# Patient Record
Sex: Female | Born: 1970 | Race: White | Hispanic: No | Smoking: Never smoker
Health system: Southern US, Community
[De-identification: ages and names within clinical notes are randomized; demographics above are authoritative.]

## PROBLEM LIST (undated history)

## (undated) DIAGNOSIS — N760 Acute vaginitis: Secondary | ICD-10-CM

## (undated) DIAGNOSIS — E669 Obesity, unspecified: Secondary | ICD-10-CM

## (undated) DIAGNOSIS — J45909 Unspecified asthma, uncomplicated: Secondary | ICD-10-CM

## (undated) DIAGNOSIS — R569 Unspecified convulsions: Secondary | ICD-10-CM

## (undated) DIAGNOSIS — J189 Pneumonia, unspecified organism: Secondary | ICD-10-CM

## (undated) DIAGNOSIS — Z309 Encounter for contraceptive management, unspecified: Secondary | ICD-10-CM

## (undated) DIAGNOSIS — B9689 Other specified bacterial agents as the cause of diseases classified elsewhere: Secondary | ICD-10-CM

## (undated) DIAGNOSIS — K219 Gastro-esophageal reflux disease without esophagitis: Secondary | ICD-10-CM

## (undated) DIAGNOSIS — M5416 Radiculopathy, lumbar region: Secondary | ICD-10-CM

## (undated) DIAGNOSIS — N898 Other specified noninflammatory disorders of vagina: Secondary | ICD-10-CM

## (undated) DIAGNOSIS — I10 Essential (primary) hypertension: Secondary | ICD-10-CM

## (undated) HISTORY — DX: Other specified bacterial agents as the cause of diseases classified elsewhere: B96.89

## (undated) HISTORY — PX: CHOLECYSTECTOMY: SHX55

## (undated) HISTORY — DX: Other specified noninflammatory disorders of vagina: N89.8

## (undated) HISTORY — DX: Encounter for contraceptive management, unspecified: Z30.9

## (undated) HISTORY — DX: Obesity, unspecified: E66.9

## (undated) HISTORY — DX: Acute vaginitis: N76.0

---
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
## 2001-05-24 ENCOUNTER — Other Ambulatory Visit: Admission: RE | Admit: 2001-05-24 | Discharge: 2001-05-24 | Payer: Self-pay | Admitting: Obstetrics and Gynecology

## 2002-06-11 ENCOUNTER — Emergency Department (HOSPITAL_COMMUNITY): Admission: EM | Admit: 2002-06-11 | Discharge: 2002-06-11 | Payer: Self-pay | Admitting: Emergency Medicine

## 2007-12-06 ENCOUNTER — Other Ambulatory Visit: Admission: RE | Admit: 2007-12-06 | Discharge: 2007-12-06 | Payer: Self-pay | Admitting: Obstetrics and Gynecology

## 2008-12-15 ENCOUNTER — Other Ambulatory Visit: Admission: RE | Admit: 2008-12-15 | Discharge: 2008-12-15 | Payer: Self-pay | Admitting: Obstetrics and Gynecology

## 2009-06-21 ENCOUNTER — Emergency Department (HOSPITAL_COMMUNITY): Admission: EM | Admit: 2009-06-21 | Discharge: 2009-06-21 | Payer: Self-pay | Admitting: Emergency Medicine

## 2011-01-25 ENCOUNTER — Other Ambulatory Visit (HOSPITAL_COMMUNITY)
Admission: RE | Admit: 2011-01-25 | Discharge: 2011-01-25 | Disposition: A | Discharge status: Home / Self Care | Payer: Medicaid Other | Source: Ambulatory Visit | Attending: Obstetrics and Gynecology | Admitting: Obstetrics and Gynecology

## 2011-01-25 ENCOUNTER — Other Ambulatory Visit: Payer: Self-pay | Admitting: Adult Health

## 2011-01-25 DIAGNOSIS — Z01419 Encounter for gynecological examination (general) (routine) without abnormal findings: Secondary | ICD-10-CM | POA: Insufficient documentation

## 2011-01-25 DIAGNOSIS — Z113 Encounter for screening for infections with a predominantly sexual mode of transmission: Secondary | ICD-10-CM | POA: Insufficient documentation

## 2011-05-07 ENCOUNTER — Encounter (HOSPITAL_COMMUNITY): Payer: Self-pay

## 2011-05-07 ENCOUNTER — Emergency Department (HOSPITAL_COMMUNITY)
Admission: EM | Admit: 2011-05-07 | Discharge: 2011-05-07 | Disposition: A | Discharge status: Home / Self Care | Payer: Medicaid Other | Attending: Emergency Medicine | Admitting: Emergency Medicine

## 2011-05-07 DIAGNOSIS — B09 Unspecified viral infection characterized by skin and mucous membrane lesions: Secondary | ICD-10-CM

## 2011-05-07 DIAGNOSIS — R21 Rash and other nonspecific skin eruption: Secondary | ICD-10-CM | POA: Insufficient documentation

## 2011-05-07 MED ORDER — PREDNISONE 10 MG PO TABS: ORAL_TABLET | ORAL | Status: DC

## 2011-05-07 MED ORDER — HYDROXYZINE PAMOATE 25 MG PO CAPS: ORAL_CAPSULE | ORAL | Status: DC

## 2011-05-07 MED ORDER — LORATADINE 10 MG PO TABS: ORAL_TABLET | ORAL | Status: DC

## 2011-05-07 NOTE — ED Notes (Signed)
Pt presents with rash to torso, back, and arms that started last Saturday. Pt states she has tried Benadryl but no relief.

## 2011-05-07 NOTE — ED Provider Notes (Signed)
History     CSN: [id number]  Arrival date & time 05/07/11  1423   First MD Initiated Contact with Patient 05/07/11 1519      Chief Complaint  Patient presents with  . Rash    (Consider location/radiation/quality/duration/timing/severity/associated sxs/prior treatment) Patient is a 41 y.o. female presenting with rash. The history is provided by the patient.  Rash  This is a new problem. The current episode started more than 1 week ago. The problem has been gradually worsening. The problem is associated with nothing. There has been no fever. The rash is present on the abdomen and back (right axilla). The patient is experiencing no pain. Associated symptoms include itching. Pertinent negatives include no pain. She has tried nothing for the symptoms.    History reviewed. No pertinent past medical history.  Past Surgical History  Procedure Date  . Cholecystectomy     No family history on file.  History  Substance Use Topics  . Smoking status: Never Smoker   . Smokeless tobacco: Not on file  . Alcohol Use: No    OB History    Grav Para Term Preterm Abortions TAB SAB Ect Mult Living                  Review of Systems  Constitutional: Negative for activity change.       All ROS Neg except as noted in HPI  HENT: Negative for nosebleeds and neck pain.   Eyes: Negative for photophobia and discharge.  Respiratory: Negative for cough, shortness of breath and wheezing.   Cardiovascular: Negative for chest pain and palpitations.  Gastrointestinal: Negative for abdominal pain and blood in stool.  Genitourinary: Negative for dysuria, frequency and hematuria.  Musculoskeletal: Negative for back pain and arthralgias.  Skin: Positive for itching and rash.  Neurological: Negative for dizziness, seizures and speech difficulty.  Psychiatric/Behavioral: Negative for hallucinations and confusion.    Allergies  Penicillins and Codeine  Home Medications  No current outpatient  prescriptions on file.  BP 155/96  Pulse 99  Temp(Src) 98.3 F (36.8 C) (Oral)  Resp 20  Ht 5' 6.5" (1.689 m)  Wt 220 lb (99.791 kg)  BMI 34.98 kg/m2  SpO2 98%  LMP 05/05/2011  Physical Exam  Nursing note and vitals reviewed. Constitutional: She is oriented to person, place, and time. She appears well-developed and well-nourished.  Non-toxic appearance.  HENT:  Head: Normocephalic.  Right Ear: Tympanic membrane and external ear normal.  Left Ear: Tympanic membrane and external ear normal.  Eyes: EOM and lids are normal. Pupils are equal, round, and reactive to light.  Neck: Normal range of motion. Neck supple. Carotid bruit is not present.  Cardiovascular: Normal rate, regular rhythm, normal heart sounds, intact distal pulses and normal pulses.   Pulmonary/Chest: Breath sounds normal. No respiratory distress.  Abdominal: Soft. Bowel sounds are normal. There is no tenderness. There is no guarding.  Musculoskeletal: Normal range of motion.  Lymphadenopathy:       Head (right side): No submandibular adenopathy present.       Head (left side): No submandibular adenopathy present.    She has no cervical adenopathy.  Neurological: She is alert and oriented to person, place, and time. She has normal strength. No cranial nerve deficit or sensory deficit.  Skin: Skin is warm and dry. Rash noted.  Psychiatric: She has a normal mood and affect. Her speech is normal.    ED Course  Procedures (including critical care time)  Labs Reviewed -  No data to display No results found.   Dx: Rash   MDM  I have reviewed nursing notes, vital signs, and all appropriate lab and imaging results for this patient.        Kathie Dike, [place] 05/07/11 1537

## 2011-05-07 NOTE — ED Provider Notes (Signed)
Medical screening examination/treatment/procedure(s) were performed by non-physician practitioner and as supervising physician I was immediately available for consultation/collaboration.   Bion Todorov L Uzma Hellmer, MD 05/07/11 1712 

## 2012-01-11 ENCOUNTER — Encounter (HOSPITAL_COMMUNITY): Payer: Self-pay | Admitting: *Deleted

## 2012-01-11 ENCOUNTER — Emergency Department (HOSPITAL_COMMUNITY): Payer: Medicaid Other

## 2012-01-11 ENCOUNTER — Emergency Department (HOSPITAL_COMMUNITY)
Admission: EM | Admit: 2012-01-11 | Discharge: 2012-01-11 | Disposition: A | Discharge status: Home / Self Care | Payer: Medicaid Other | Attending: Emergency Medicine | Admitting: Emergency Medicine

## 2012-01-11 DIAGNOSIS — S61409A Unspecified open wound of unspecified hand, initial encounter: Secondary | ICD-10-CM | POA: Insufficient documentation

## 2012-01-11 DIAGNOSIS — Z23 Encounter for immunization: Secondary | ICD-10-CM | POA: Insufficient documentation

## 2012-01-11 DIAGNOSIS — W268XXA Contact with other sharp object(s), not elsewhere classified, initial encounter: Secondary | ICD-10-CM | POA: Insufficient documentation

## 2012-01-11 DIAGNOSIS — Z79899 Other long term (current) drug therapy: Secondary | ICD-10-CM | POA: Insufficient documentation

## 2012-01-11 DIAGNOSIS — S61411A Laceration without foreign body of right hand, initial encounter: Secondary | ICD-10-CM

## 2012-01-11 MED ORDER — BACITRACIN ZINC 500 UNIT/GM EX OINT
TOPICAL_OINTMENT | CUTANEOUS | Status: AC
  Filled 2012-01-11: qty 0.9

## 2012-01-11 MED ORDER — TETANUS-DIPHTH-ACELL PERTUSSIS 5-2.5-18.5 LF-MCG/0.5 IM SUSP
0.5000 mL | Freq: Once | INTRAMUSCULAR | Status: AC
  Administered 2012-01-11: 0.5 mL via INTRAMUSCULAR
  Filled 2012-01-11: qty 0.5

## 2012-01-11 MED ORDER — LIDOCAINE HCL (PF) 2 % IJ SOLN
INTRAMUSCULAR | Status: AC
  Administered 2012-01-11: 10 mL
  Filled 2012-01-11: qty 10

## 2012-01-11 MED ORDER — LIDOCAINE HCL (PF) 2 % IJ SOLN
10.0000 mL | Freq: Once | INTRAMUSCULAR | Status: AC
  Administered 2012-01-11: 10 mL

## 2012-01-11 NOTE — ED Notes (Signed)
Pt presents with rt palm laceration secondary to a fall on metal rod in her yard. Bleeding controlled, 4x4 guaze applied to wound. Pt states has not had a tetanus shot in 11 years. No other injuries reported at this time.

## 2012-01-11 NOTE — ED Provider Notes (Signed)
History     CSN: [id number]  Arrival date & time 01/11/12  1407   First MD Initiated Contact with Patient 01/11/12 1716      Chief Complaint  Patient presents with  . Laceration    (Consider location/radiation/quality/duration/timing/severity/associated sxs/prior treatment) Patient is a 41 y.o. female presenting with skin laceration. The history is provided by the patient.  Laceration  The incident occurred 3 to 5 hours ago. The laceration is located on the right hand. The laceration is 3 cm in size. The laceration mechanism was a a metal edge. The pain is moderate. The pain has been constant since onset. She reports no foreign bodies present. Her tetanus status is out of date.    History reviewed. No pertinent past medical history.  Past Surgical History  Procedure Date  . Cholecystectomy     No family history on file.  History  Substance Use Topics  . Smoking status: Never Smoker   . Smokeless tobacco: Not on file  . Alcohol Use: No    OB History    Grav Para Term Preterm Abortions TAB SAB Ect Mult Living                  Review of Systems  Constitutional: Negative for activity change.       All ROS Neg except as noted in HPI  HENT: Negative for nosebleeds and neck pain.   Eyes: Negative for photophobia and discharge.  Respiratory: Negative for cough, shortness of breath and wheezing.   Cardiovascular: Negative for chest pain and palpitations.  Gastrointestinal: Negative for abdominal pain and blood in stool.  Genitourinary: Negative for dysuria, frequency and hematuria.  Musculoskeletal: Negative for back pain and arthralgias.  Skin: Negative.   Neurological: Negative for dizziness, seizures and speech difficulty.  Psychiatric/Behavioral: Negative for hallucinations and confusion.    Allergies  Penicillins and Codeine  Home Medications   Current Outpatient Rx  Name Route Sig Dispense Refill  . ASPIRIN-ACETAMINOPHEN-CAFFEINE 250-250-65 MG PO TABS Oral  Take 2 tablets by mouth daily as needed. For migraine pain    . NORGESTIM-ETH ESTRAD TRIPHASIC 0.18/0.215/0.25 MG-25 MCG PO TABS Oral Take 1 tablet by mouth daily.    Marland Kitchen OMEPRAZOLE 20 MG PO CPDR Oral Take 20 mg by mouth daily.      BP 152/92  Pulse 84  Temp 98.5 F (36.9 C) (Oral)  Resp 20  Ht 5\' 6"  (1.676 m)  Wt 200 lb (90.719 kg)  BMI 32.28 kg/m2  SpO2 100%  LMP 12/11/2011  Physical Exam  Nursing note and vitals reviewed. Constitutional: She is oriented to person, place, and time. She appears well-developed and well-nourished.  Non-toxic appearance.  HENT:  Head: Normocephalic.  Right Ear: Tympanic membrane and external ear normal.  Left Ear: Tympanic membrane and external ear normal.  Eyes: EOM and lids are normal. Pupils are equal, round, and reactive to light.  Neck: Normal range of motion. Neck supple. Carotid bruit is not present.  Cardiovascular: Normal rate, regular rhythm, normal heart sounds, intact distal pulses and normal pulses.   Pulmonary/Chest: Breath sounds normal. No respiratory distress.  Abdominal: Soft. Bowel sounds are normal. There is no tenderness. There is no guarding.  Musculoskeletal: Normal range of motion.       There is a 2.8 cm laceration to the palmar surface of the right hand. There is full range of motion of all fingers. Capillary refill of all fingers is less than 3 seconds. There is no bone or tendon  involvement of the laceration. No foreign body visible.  Lymphadenopathy:       Head (right side): No submandibular adenopathy present.       Head (left side): No submandibular adenopathy present.    She has no cervical adenopathy.  Neurological: She is alert and oriented to person, place, and time. She has normal strength. No cranial nerve deficit or sensory deficit.  Skin: Skin is warm and dry.  Psychiatric: She has a normal mood and affect. Her speech is normal.    ED Course  Procedures :LACERATION REPAIR - patient identified by arm band.  Permission for the procedure is given by the patient. Procedural time out taken before repair of laceration to the right hand. The palm of the right hand was painted with Betadine. The laceration area was then infiltrated with 2% plain lidocaine. After adequate anesthesia. The area was irrigated with saline and investigated. A small amount of trash was removed without problem. The area was then irrigated a second time. Patient was again painted with Betadine. Using sterile technique 5 interrupted sutures of 4-0 nylon were used to repair the laceration with good wound edge approximation. Wound measures 2.8 cm. Bleeding was controlled. Patient tolerated the procedure without problem or complication. Sterile dressing applied by nursing staff. Patient's tetanus status was updated.  Labs Reviewed - No data to display Dg Hand Complete Right  01/11/2012  *RADIOLOGY REPORT*  Clinical Data: Laceration  RIGHT HAND - COMPLETE 3+ VIEW  Comparison: None.  Findings: Three views of the right hand submitted.  No acute fracture or subluxation.  No radiopaque foreign body.  Suboptimal study due to flexed position of the fifth finger.  IMPRESSION: No acute fracture or subluxation.  No radiopaque foreign body.   Original Report Authenticated By: Natasha Mead, M.D.    Pulse oximetry 100% on room air. Within normal limits by my interpretation.  1. Laceration of right hand       MDM  I have reviewed nursing notes, vital signs, and all appropriate lab and imaging results for this patient. Patient sustained a laceration during a fall on a piece of pipe from her stairs. The wound was cleansed and repaired. Patient advised to keep the wound clean and dry. She is to have the sutures removed in 7 days.       Kathie Dike, [place] 01/11/12 1757

## 2012-01-11 NOTE — ED Provider Notes (Signed)
Medical screening examination/treatment/procedure(s) were performed by non-physician practitioner and as supervising physician I was immediately available for consultation/collaboration. Devoria Albe, MD, FACEP   Ward Givens, MD 01/11/12 1800

## 2012-01-11 NOTE — ED Notes (Signed)
C/o right hand pain s/p fall; states tripped and fell coming downstairs; lacerated right palm on metal rod during fall; approx 2" lac to right hand; bleeding controlled.

## 2012-01-26 ENCOUNTER — Other Ambulatory Visit: Payer: Self-pay | Admitting: Adult Health

## 2012-01-26 ENCOUNTER — Other Ambulatory Visit (HOSPITAL_COMMUNITY)
Admission: RE | Admit: 2012-01-26 | Discharge: 2012-01-26 | Disposition: A | Discharge status: Home / Self Care | Payer: Medicaid Other | Source: Ambulatory Visit | Attending: Obstetrics and Gynecology | Admitting: Obstetrics and Gynecology

## 2012-01-26 DIAGNOSIS — Z1151 Encounter for screening for human papillomavirus (HPV): Secondary | ICD-10-CM | POA: Insufficient documentation

## 2012-01-26 DIAGNOSIS — Z01419 Encounter for gynecological examination (general) (routine) without abnormal findings: Secondary | ICD-10-CM | POA: Insufficient documentation

## 2012-04-12 ENCOUNTER — Emergency Department (HOSPITAL_COMMUNITY)
Admission: EM | Admit: 2012-04-12 | Discharge: 2012-04-12 | Disposition: A | Discharge status: Home / Self Care | Payer: Medicaid Other | Attending: Emergency Medicine | Admitting: Emergency Medicine

## 2012-04-12 ENCOUNTER — Encounter (HOSPITAL_COMMUNITY): Payer: Self-pay

## 2012-04-12 DIAGNOSIS — R51 Headache: Secondary | ICD-10-CM | POA: Insufficient documentation

## 2012-04-12 DIAGNOSIS — Z791 Long term (current) use of non-steroidal anti-inflammatories (NSAID): Secondary | ICD-10-CM | POA: Insufficient documentation

## 2012-04-12 DIAGNOSIS — J3489 Other specified disorders of nose and nasal sinuses: Secondary | ICD-10-CM | POA: Insufficient documentation

## 2012-04-12 DIAGNOSIS — Z79899 Other long term (current) drug therapy: Secondary | ICD-10-CM | POA: Insufficient documentation

## 2012-04-12 DIAGNOSIS — L519 Erythema multiforme, unspecified: Secondary | ICD-10-CM | POA: Insufficient documentation

## 2012-04-12 DIAGNOSIS — J392 Other diseases of pharynx: Secondary | ICD-10-CM | POA: Insufficient documentation

## 2012-04-12 DIAGNOSIS — J039 Acute tonsillitis, unspecified: Secondary | ICD-10-CM

## 2012-04-12 DIAGNOSIS — R509 Fever, unspecified: Secondary | ICD-10-CM | POA: Insufficient documentation

## 2012-04-12 DIAGNOSIS — Z7982 Long term (current) use of aspirin: Secondary | ICD-10-CM | POA: Insufficient documentation

## 2012-04-12 LAB — RAPID STREP SCREEN (MED CTR MEBANE ONLY): Streptococcus, Group A Screen (Direct): NEGATIVE

## 2012-04-12 MED ORDER — HYDROCODONE-ACETAMINOPHEN 7.5-500 MG/15ML PO SOLN: 10.0000 mL | ORAL | Status: DC | PRN

## 2012-04-12 MED ORDER — NAPROXEN 500 MG PO TABS: 500.0000 mg | ORAL_TABLET | Freq: Two times a day (BID) | ORAL | Status: DC

## 2012-04-12 NOTE — ED Notes (Signed)
C/o sore throat for several days. nad noted. Speaks without difficulty. Handling secretions without difficulty.

## 2012-04-15 NOTE — ED Provider Notes (Signed)
History     CSN: [id number]  Arrival date & time 04/12/12  1740   First MD Initiated Contact with Patient 04/12/12 2054      Chief Complaint  Patient presents with  . Sore Throat    (Consider location/radiation/quality/duration/timing/severity/associated sxs/prior treatment) Patient is a 41 y.o. female presenting with pharyngitis. The history is provided by the patient.  Sore Throat This is a new problem. The current episode started in the past 7 days. The problem occurs constantly. The problem has been unchanged. Associated symptoms include congestion, a fever, headaches and a sore throat. Pertinent negatives include no abdominal pain, anorexia, arthralgias, chest pain, chills, coughing, nausea, neck pain, numbness, rash, swollen glands, visual change, vomiting or weakness. The symptoms are aggravated by swallowing. She has tried nothing for the symptoms. The treatment provided no relief.    History reviewed. No pertinent past medical history.  Past Surgical History  Procedure Date  . Cholecystectomy     No family history on file.  History  Substance Use Topics  . Smoking status: Never Smoker   . Smokeless tobacco: Not on file  . Alcohol Use: No    OB History    Grav Para Term Preterm Abortions TAB SAB Ect Mult Living                  Review of Systems  Constitutional: Positive for fever. Negative for chills, activity change and appetite change.  HENT: Positive for congestion and sore throat. Negative for ear pain, facial swelling, trouble swallowing, neck pain, neck stiffness and voice change.   Eyes: Negative for pain and visual disturbance.  Respiratory: Negative for cough, chest tightness and shortness of breath.   Cardiovascular: Negative for chest pain.  Gastrointestinal: Negative for nausea, vomiting, abdominal pain and anorexia.  Genitourinary: Negative for dysuria.  Musculoskeletal: Negative for arthralgias.  Skin: Negative for color change and rash.    Neurological: Positive for headaches. Negative for dizziness, facial asymmetry, speech difficulty, weakness and numbness.  Hematological: Negative for adenopathy.  All other systems reviewed and are negative.    Allergies  Penicillins and Codeine  Home Medications   Current Outpatient Rx  Name  Route  Sig  Dispense  Refill  . ASPIRIN-ACETAMINOPHEN-CAFFEINE 250-250-65 MG PO TABS   Oral   Take 2 tablets by mouth daily as needed. For migraine pain         . NORGESTIM-ETH ESTRAD TRIPHASIC 0.18/0.215/0.25 MG-25 MCG PO TABS   Oral   Take 1 tablet by mouth daily.         Marland Kitchen OMEPRAZOLE 20 MG PO CPDR   Oral   Take 20 mg by mouth daily.         Marland Kitchen HYDROCODONE-ACETAMINOPHEN 7.5-500 MG/15ML PO SOLN   Oral   Take 10 mLs by mouth every 4 (four) hours as needed for pain.   60 mL   0   . NAPROXEN 500 MG PO TABS   Oral   Take 1 tablet (500 mg total) by mouth 2 (two) times daily with a meal.   20 tablet   0     BP 141/78  Pulse 112  Temp 98.1 F (36.7 C) (Oral)  Resp 22  Ht 5\' 6"  (1.676 m)  Wt 220 lb (99.791 kg)  BMI 35.51 kg/m2  SpO2 99%  LMP 04/05/2012  Physical Exam  Nursing note and vitals reviewed. Constitutional: She is oriented to person, place, and time. She appears well-developed and well-nourished. No distress.  HENT:  Head: Normocephalic and atraumatic. No trismus in the jaw.  Right Ear: Tympanic membrane and ear canal normal.  Left Ear: Tympanic membrane and ear canal normal.  Mouth/Throat: Uvula is midline and mucous membranes are normal. No uvula swelling. Oropharyngeal exudate, posterior oropharyngeal edema and posterior oropharyngeal erythema present. No tonsillar abscesses.  Eyes: EOM are normal. Pupils are equal, round, and reactive to light.  Neck: Normal range of motion. Neck supple.  Cardiovascular: Normal rate, regular rhythm, normal heart sounds and intact distal pulses.   No murmur heard. Pulmonary/Chest: Effort normal and breath sounds  normal. No respiratory distress. She exhibits no tenderness.  Musculoskeletal: Normal range of motion.  Lymphadenopathy:    She has no cervical adenopathy.  Neurological: She is alert and oriented to person, place, and time. She exhibits normal muscle tone. Coordination normal.  Skin: Skin is warm and dry.    ED Course  Procedures (including critical care time)   Results for orders placed during the hospital encounter of 04/12/12  RAPID STREP SCREEN      Component Value Range   Streptococcus, Group A Screen (Direct) NEGATIVE  NEGATIVE     1. Tonsillitis       MDM    Erythema of the oropharynx with bilateral tonsillar exudates.  No PTA.  Pt otherwise well appearing, no meningeal signs.  Likely viral.  Will treat symptomatically.  Agrees to f/u with her PMD if needed.  Prescribed: lortab elixir 60 mL naprosyn        Beverly Ferner L. [place], [place] 04/15/12 1943

## 2012-04-23 NOTE — ED Provider Notes (Signed)
Medical screening examination/treatment/procedure(s) were performed by non-physician practitioner and as supervising physician I was immediately available for consultation/collaboration.   Merry Pond M Kennieth Plotts, DO 04/23/12 1509 

## 2012-06-25 ENCOUNTER — Emergency Department (HOSPITAL_COMMUNITY)
Admission: EM | Admit: 2012-06-25 | Discharge: 2012-06-25 | Disposition: A | Discharge status: Home / Self Care | Payer: Medicaid Other | Attending: Emergency Medicine | Admitting: Emergency Medicine

## 2012-06-25 ENCOUNTER — Encounter (HOSPITAL_COMMUNITY): Payer: Self-pay

## 2012-06-25 DIAGNOSIS — R0602 Shortness of breath: Secondary | ICD-10-CM | POA: Insufficient documentation

## 2012-06-25 DIAGNOSIS — R093 Abnormal sputum: Secondary | ICD-10-CM | POA: Insufficient documentation

## 2012-06-25 DIAGNOSIS — J3489 Other specified disorders of nose and nasal sinuses: Secondary | ICD-10-CM | POA: Insufficient documentation

## 2012-06-25 DIAGNOSIS — J029 Acute pharyngitis, unspecified: Secondary | ICD-10-CM | POA: Insufficient documentation

## 2012-06-25 DIAGNOSIS — Z79899 Other long term (current) drug therapy: Secondary | ICD-10-CM | POA: Insufficient documentation

## 2012-06-25 DIAGNOSIS — J069 Acute upper respiratory infection, unspecified: Secondary | ICD-10-CM | POA: Insufficient documentation

## 2012-06-25 DIAGNOSIS — R071 Chest pain on breathing: Secondary | ICD-10-CM | POA: Insufficient documentation

## 2012-06-25 DIAGNOSIS — I10 Essential (primary) hypertension: Secondary | ICD-10-CM | POA: Insufficient documentation

## 2012-06-25 DIAGNOSIS — R1110 Vomiting, unspecified: Secondary | ICD-10-CM | POA: Insufficient documentation

## 2012-06-25 DIAGNOSIS — R0789 Other chest pain: Secondary | ICD-10-CM

## 2012-06-25 DIAGNOSIS — H9209 Otalgia, unspecified ear: Secondary | ICD-10-CM | POA: Insufficient documentation

## 2012-06-25 HISTORY — DX: Essential (primary) hypertension: I10

## 2012-06-25 LAB — RAPID STREP SCREEN (MED CTR MEBANE ONLY): Streptococcus, Group A Screen (Direct): NEGATIVE

## 2012-06-25 MED ORDER — MUCINEX DM 30-600 MG PO TB12: 1.0000 | ORAL_TABLET | Freq: Two times a day (BID) | ORAL | Status: DC | PRN

## 2012-06-25 NOTE — Discharge Instructions (Signed)
Drink plenty of fluids. You can take mucinex DM for your cough. You can take either ibuprofen 600 mg 4 times a day OR aleve 2 tabs twice a day for your chest wall pain. Recheck if you aren't improving in another week.

## 2012-06-25 NOTE — ED Provider Notes (Signed)
History    This chart was scribed for Ward Givens, MD by Charolett Bumpers, ED Scribe. The patient was seen in room [facility]19/[facility]. Patient's care was started at 0738.   CSN: [id number]  Arrival date & time 06/25/12  0719   First MD Initiated Contact with Patient 06/25/12 [phone]      Chief Complaint  Patient presents with  . Cough   The history is provided by the patient. No language interpreter was used.   Marie Mills is a 42 y.o. female who presents to the Emergency Department complaining of 3 days of gradually worsening, moderate productive cough with yellow phlegm. She reports associated congestion, sore throat, ear pain and post-tussive emesis. She denies any fever, wheezing or diarrhea. She states that her chest is sore with coughing/breathing. She was recently started on HTN medication 3 weeks ago. She reports sick contacts at home, daughter patient in ED with same.   PCP: Dr. Olena Leatherwood in [place]  Past Medical History  Diagnosis Date  . Hypertension     Past Surgical History  Procedure Laterality Date  . Cholecystectomy      No family history on file.  History  Substance Use Topics  . Smoking status: Never Smoker   . Smokeless tobacco: Not on file  . Alcohol Use: No  She denies smoking unemployed    OB History   Grav Para Term Preterm Abortions TAB SAB Ect Mult Living                  Review of Systems  Constitutional: Negative for fever and chills.  HENT: Positive for ear pain, congestion, sore throat and rhinorrhea.   Respiratory: Positive for shortness of breath.   All other systems reviewed and are negative.    Allergies  Penicillins and Codeine  Home Medications   Current Outpatient Rx  Name  Route  Sig  Dispense  Refill  . Norgestimate-Ethinyl Estradiol Triphasic (ORTHO TRI-CYCLEN LO) 0.18/0.215/0.25 MG-25 MCG tab   Oral   Take 1 tablet by mouth daily.         Marland Kitchen omeprazole (PRILOSEC) 20 MG capsule   Oral   Take 20 mg by mouth daily.          New BP pill for 3 weeks  Triage Vitals: BP 139/85  Pulse 77  Temp(Src) 97.9 F (36.6 C) (Oral)  Resp 20  Ht 5\' 6"  (1.676 m)  Wt 200 lb (90.719 kg)  BMI 32.3 kg/m2  SpO2 100%  LMP 06/04/2012  Vital signs normal    Physical Exam  Nursing note and vitals reviewed. Constitutional: She is oriented to person, place, and time. She appears well-developed and well-nourished.  HENT:  Head: Normocephalic and atraumatic.  Right Ear: External ear normal.  Left Ear: External ear normal.  Nose: Nose normal.  Mouth/Throat: Oropharynx is clear and moist. No oropharyngeal exudate.  Eyes: Conjunctivae and EOM are normal. Pupils are equal, round, and reactive to light.  Neck: Normal range of motion. Neck supple. No tracheal deviation present.  Cardiovascular: Normal rate, regular rhythm and normal heart sounds.  Exam reveals no gallop and no friction rub.   No murmur heard. Pulmonary/Chest: Effort normal and breath sounds normal. No respiratory distress. She has no wheezes. She has no rales. She exhibits tenderness.    Mild upper chest tenderness. Area of chest pain noted  Abdominal: Soft. Bowel sounds are normal. She exhibits no distension. There is no tenderness. There is no rebound and no guarding.  Musculoskeletal: Normal range of motion. She exhibits no tenderness.  Neurological: She is alert and oriented to person, place, and time.  Skin: Skin is warm and dry.  Psychiatric: She has a normal mood and affect. Her behavior is normal.    ED Course  Procedures (including critical care time)  DIAGNOSTIC STUDIES: Oxygen Saturation is 100% on room air, normal by my interpretation.    COORDINATION OF CARE:  08:00-Discussed planned course of treatment with the patient including a rapid strep screen, who is agreeable at this time.    Results for orders placed during the hospital encounter of 06/25/12  RAPID STREP SCREEN      Result Value Range   Streptococcus, Group A Screen  (Direct) NEGATIVE  NEGATIVE      1. URI (upper respiratory infection)   2. Chest wall pain    New Prescriptions   DEXTROMETHORPHAN-GUAIFENESIN (MUCINEX DM) 30-600 MG TB12    Take 1 tablet by mouth 2 (two) times daily as needed (cough).   Plan discharge  Devoria Albe, MD, FACEP    MDM   I personally performed the services described in this documentation, which was scribed in my presence. The recorded information has been reviewed and considered.  Devoria Albe, MD, FACEP      Ward Givens, MD 06/25/12 [phone]

## 2012-06-25 NOTE — ED Notes (Signed)
Sinus congestion, cough and sore throat x 3 days.  States cough and chest congestion kept her awake last night. Has been using Mucinex w/out relief.

## 2012-06-25 NOTE — ED Notes (Signed)
Patient with no complaints at this time. Respirations even and unlabored. Skin warm/dry. Discharge instructions reviewed with patient at this time. Patient given opportunity to voice concerns/ask questions. Patient discharged at this time and left Emergency Department with steady gait.   

## 2012-06-25 NOTE — ED Notes (Signed)
Cough and congestion

## 2012-09-21 ENCOUNTER — Emergency Department (HOSPITAL_COMMUNITY): Payer: No Typology Code available for payment source

## 2012-09-21 ENCOUNTER — Encounter (HOSPITAL_COMMUNITY): Payer: Self-pay | Admitting: *Deleted

## 2012-09-21 ENCOUNTER — Emergency Department (HOSPITAL_COMMUNITY)
Admission: EM | Admit: 2012-09-21 | Discharge: 2012-09-21 | Disposition: A | Discharge status: Home / Self Care | Payer: No Typology Code available for payment source | Attending: Emergency Medicine | Admitting: Emergency Medicine

## 2012-09-21 DIAGNOSIS — R0602 Shortness of breath: Secondary | ICD-10-CM | POA: Insufficient documentation

## 2012-09-21 DIAGNOSIS — Z880 Allergy status to penicillin: Secondary | ICD-10-CM | POA: Insufficient documentation

## 2012-09-21 DIAGNOSIS — Y92410 Unspecified street and highway as the place of occurrence of the external cause: Secondary | ICD-10-CM | POA: Insufficient documentation

## 2012-09-21 DIAGNOSIS — Y9389 Activity, other specified: Secondary | ICD-10-CM | POA: Insufficient documentation

## 2012-09-21 DIAGNOSIS — I10 Essential (primary) hypertension: Secondary | ICD-10-CM | POA: Insufficient documentation

## 2012-09-21 DIAGNOSIS — S298XXA Other specified injuries of thorax, initial encounter: Secondary | ICD-10-CM | POA: Insufficient documentation

## 2012-09-21 DIAGNOSIS — S40019A Contusion of unspecified shoulder, initial encounter: Secondary | ICD-10-CM | POA: Insufficient documentation

## 2012-09-21 DIAGNOSIS — Z79899 Other long term (current) drug therapy: Secondary | ICD-10-CM | POA: Insufficient documentation

## 2012-09-21 DIAGNOSIS — T148XXA Other injury of unspecified body region, initial encounter: Secondary | ICD-10-CM

## 2012-09-21 MED ORDER — HYDROCODONE-ACETAMINOPHEN 5-325 MG PO TABS: 1.0000 | ORAL_TABLET | ORAL | Status: DC | PRN

## 2012-09-21 MED ORDER — IBUPROFEN 800 MG PO TABS
800.0000 mg | ORAL_TABLET | Freq: Once | ORAL | Status: AC
  Administered 2012-09-21: 800 mg via ORAL
  Filled 2012-09-21: qty 1

## 2012-09-21 MED ORDER — ONDANSETRON HCL 4 MG PO TABS: 8.0000 mg | ORAL_TABLET | Freq: Three times a day (TID) | ORAL | Status: DC | PRN

## 2012-09-21 MED ORDER — ONDANSETRON 8 MG PO TBDP
8.0000 mg | ORAL_TABLET | Freq: Once | ORAL | Status: AC
  Administered 2012-09-21: 8 mg via ORAL
  Filled 2012-09-21: qty 1

## 2012-09-21 MED ORDER — HYDROCODONE-ACETAMINOPHEN 5-325 MG PO TABS
1.0000 | ORAL_TABLET | Freq: Once | ORAL | Status: AC
  Administered 2012-09-21: 1 via ORAL
  Filled 2012-09-21: qty 1
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
## 2012-09-21 NOTE — ED Provider Notes (Signed)
Medical screening examination/treatment/procedure(s) were performed by non-physician practitioner and as supervising physician I was immediately available for consultation/collaboration. Devoria Albe, MD, Armando Gang   Ward Givens, MD 09/21/12 [phone]

## 2012-09-21 NOTE — ED Notes (Signed)
Pt called nurse into room for n/v. Pt states she was trying to take deep breaths, felt phlegm in her throat and began to dry heave. Pt given emesis bag and damp cloth. No active vomiting. nad noted.

## 2012-09-21 NOTE — ED Notes (Signed)
Pt states nausea is better.  
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
## 2013-01-29 ENCOUNTER — Other Ambulatory Visit: Payer: Self-pay | Admitting: Adult Health

## 2013-02-08 ENCOUNTER — Other Ambulatory Visit: Payer: Self-pay | Admitting: *Deleted

## 2013-02-08 MED ORDER — NORGESTIM-ETH ESTRAD TRIPHASIC 0.18/0.215/0.25 MG-25 MCG PO TABS: 1.0000 | ORAL_TABLET | Freq: Every day | ORAL | Status: DC

## 2013-03-21 ENCOUNTER — Ambulatory Visit (INDEPENDENT_AMBULATORY_CARE_PROVIDER_SITE_OTHER): Payer: Medicaid Other | Admitting: Adult Health

## 2013-03-21 ENCOUNTER — Encounter: Payer: Self-pay | Admitting: Adult Health

## 2013-03-21 VITALS — BP 118/80 | HR 78 | Ht 66.0 in | Wt 239.0 lb

## 2013-03-21 DIAGNOSIS — Z1212 Encounter for screening for malignant neoplasm of rectum: Secondary | ICD-10-CM

## 2013-03-21 DIAGNOSIS — Z01419 Encounter for gynecological examination (general) (routine) without abnormal findings: Secondary | ICD-10-CM

## 2013-03-21 DIAGNOSIS — Z0000 Encounter for general adult medical examination without abnormal findings: Secondary | ICD-10-CM

## 2013-03-21 DIAGNOSIS — Z309 Encounter for contraceptive management, unspecified: Secondary | ICD-10-CM

## 2013-03-21 DIAGNOSIS — I10 Essential (primary) hypertension: Secondary | ICD-10-CM

## 2013-03-21 HISTORY — DX: Encounter for contraceptive management, unspecified: Z30.9

## 2013-03-21 LAB — HEMOCCULT GUIAC POC 1CARD (OFFICE): Fecal Occult Blood, POC: NEGATIVE

## 2013-03-21 NOTE — Progress Notes (Signed)
Patient ID: Marie Mills, female   DOB: [birth year], 42 y.o.   MRN: [id number] History of Present Illness:  Marie Mills is a 42 year old Hispanic female in stable relationship in for a physical.She had a normal pap with negative HPV in 2013.Is trying to lose weight.  Current Medications, Allergies, Past Medical History, Past Surgical History, Family History and Social History were reviewed in [name] record.     Review of Systems: Patient denies any headaches, blurred vision, shortness of breath, chest pain, abdominal pain, problems with bowel movements, urination, or intercourse. No joint pain or mood swings.She is happy with her OCs.    Physical Exam:BP 118/80  Pulse 78  Ht 5\' 6"  (1.676 m)  Wt 239 lb (108.41 kg)  BMI 38.59 kg/m2  LMP 03/01/2013 General:  Well developed, well nourished, no acute distress Skin:  Warm and dry Neck:  Midline trachea, normal thyroid Lungs; Clear to auscultation bilaterally Breast:  No dominant palpable mass, retraction, or nipple discharge Cardiovascular: Regular rate and rhythm Abdomen:  Soft, non tender, no hepatosplenomegaly Pelvic:  External genitalia is normal in appearance.  The vagina is normal in appearance.The cervix is bulbous.  Uterus is felt to be normal size, shape, and contour.  No adnexal masses or tenderness noted. Rectal: Good sphincter tone, no polyps, or hemorrhoids felt.  Hemoccult negative. Extremities:  No swelling or varicosities noted Psych:  No mood changes, alert and cooperative, seems happy   Impression: Yearly gyn exam no pap Contraceptive management Hypertension   Plan: Physical in 1 year Mammogram now and yearly Continue meds call if needs refills Try to lose weight, review handout on weight loss

## 2013-03-21 NOTE — Patient Instructions (Signed)
Physical in 1 year Mammogram now and yearly  Follow up prn Calorie Counting Diet A calorie counting diet requires you to eat the number of calories that are right for you in a day. Calories are the measurement of how much energy you get from the food you eat. Eating the right amount of calories is important for staying at a healthy weight. If you eat too many calories, your body will store them as fat and you may gain weight. If you eat too few calories, you may lose weight. Counting the number of calories you eat during a day will help you know if you are eating the right amount. A Registered Dietitian can determine how many calories you need in a day. The amount of calories needed varies from person to person. If your goal is to lose weight, you will need to eat fewer calories. Losing weight can benefit you if you are overweight or have health problems such as heart disease, high blood pressure, or diabetes. If your goal is to gain weight, you will need to eat more calories. Gaining weight may be necessary if you have a certain health problem that causes your body to need more energy. TIPS Whether you are increasing or decreasing the number of calories you eat during a day, it may be hard to get used to changes in what you eat and drink. The following are tips to help you keep track of the number of calories you eat.  Measure foods at home with measuring cups. This helps you know the amount of food and number of calories you are eating.  Restaurants often serve food in amounts that are larger than 1 serving. While eating out, estimate how many servings of a food you are given. For example, a serving of cooked rice is  cup or about the size of half of a fist. Knowing serving sizes will help you be aware of how much food you are eating at restaurants.  Ask for smaller portion sizes or child-size portions at restaurants.  Plan to eat half of a meal at a restaurant. Take the rest home or share the  other half with a friend.  Read the Nutrition Facts panel on food labels for calorie content and serving size. You can find out how many servings are in a package, the size of a serving, and the number of calories each serving has.  For example, a package might contain 3 cookies. The Nutrition Facts panel on that package says that 1 serving is 1 cookie. Below that, it will say there are 3 servings in the container. The calories section of the Nutrition Facts label says there are 90 calories. This means there are 90 calories in 1 cookie (1 serving). If you eat 1 cookie you have eaten 90 calories. If you eat all 3 cookies, you have eaten 270 calories (3 servings x 90 calories = 270 calories). The list below tells you how big or small some common portion sizes are.  1 oz.........4 stacked dice.  3 oz........Marland KitchenDeck of cards.  1 tsp.......Marland KitchenTip of little finger.  1 tbs......Marland KitchenMarland KitchenThumb.  2 tbs.......Marland KitchenGolf ball.   cup......Marland KitchenHalf of a fist.  1 cup.......Marland KitchenA fist. KEEP A FOOD LOG Write down every food item you eat, the amount you eat, and the number of calories in each food you eat during the day. At the end of the day, you can add up the total number of calories you have eaten. It may help to keep a list  like the one below. Find out the calorie information by reading the Nutrition Facts panel on food labels. Breakfast  Bran cereal (1 cup, 110 calories).  Fat-free milk ( cup, 45 calories). Snack  Apple (1 medium, 80 calories). Lunch  Spinach (1 cup, 20 calories).  Tomato ( medium, 20 calories).  Chicken breast strips (3 oz, 165 calories).  Shredded cheddar cheese ( cup, 110 calories).  Light [place] dressing (2 tbs, 60 calories).  Whole-wheat bread (1 slice, 80 calories).  Tub margarine (1 tsp, 35 calories).  Vegetable soup (1 cup, 160 calories). Dinner  Pork chop (3 oz, 190 calories).  Brown rice (1 cup, 215 calories).  Steamed broccoli ( cup, 20 calories).  Strawberries  (1  cup, 65 calories).  Whipped cream (1 tbs, 50 calories). Daily Calorie Total: 1425 Document Released: 04/04/2005 Document Revised: 06/27/2011 Document Reviewed: 09/29/2006 [facility] Patient Information 2014 [place], [place].

## 2013-08-20 ENCOUNTER — Other Ambulatory Visit: Payer: Self-pay | Admitting: Adult Health

## 2013-08-27 ENCOUNTER — Telehealth: Payer: Self-pay | Admitting: Adult Health

## 2013-08-27 NOTE — Telephone Encounter (Signed)
Pt states seen recently by Derrek Monaco, NP c/o itching and burning x 1 day, no discharge. Appt made for tomorrow at 12 pm with Derrek Monaco, NP.

## 2013-08-29 ENCOUNTER — Ambulatory Visit (INDEPENDENT_AMBULATORY_CARE_PROVIDER_SITE_OTHER): Payer: Medicaid Other | Admitting: Adult Health

## 2013-08-29 ENCOUNTER — Encounter: Payer: Self-pay | Admitting: Adult Health

## 2013-08-29 VITALS — BP 120/72 | Ht 67.0 in | Wt 248.0 lb

## 2013-08-29 DIAGNOSIS — B9689 Other specified bacterial agents as the cause of diseases classified elsewhere: Secondary | ICD-10-CM

## 2013-08-29 DIAGNOSIS — N899 Noninflammatory disorder of vagina, unspecified: Secondary | ICD-10-CM

## 2013-08-29 DIAGNOSIS — A499 Bacterial infection, unspecified: Secondary | ICD-10-CM

## 2013-08-29 DIAGNOSIS — N898 Other specified noninflammatory disorders of vagina: Secondary | ICD-10-CM

## 2013-08-29 DIAGNOSIS — N760 Acute vaginitis: Secondary | ICD-10-CM

## 2013-08-29 HISTORY — DX: Other specified noninflammatory disorders of vagina: N89.8

## 2013-08-29 HISTORY — DX: Other specified bacterial agents as the cause of diseases classified elsewhere: B96.89

## 2013-08-29 LAB — POCT WET PREP (WET MOUNT): WBC, Wet Prep HPF POC: POSITIVE

## 2013-08-29 MED ORDER — METRONIDAZOLE 500 MG PO TABS: 500.0000 mg | ORAL_TABLET | Freq: Two times a day (BID) | ORAL | Status: DC

## 2013-08-29 NOTE — Progress Notes (Signed)
Subjective:     Patient ID: Marie Mills, female   DOB: 1971-04-14, 43 y.o.   MRN: [id number]  HPI Khelani is a 43 year old Hispanic female in complaining of vaginal irritation, no new partners, started after finished period,did use new brand of tampons.  Review of Systems See HPI Reviewed past medical,surgical, social and family history. Reviewed medications and allergies.     Objective:   Physical Exam BP 120/72  Ht 5\' 7"  (1.702 m)  Wt 248 lb (112.492 kg)  BMI 38.83 kg/m2  LMP 08/27/2013   Skin warm and dry.Pelvic: external genitalia is normal in appearance, vagina: scant white discharge with odor, cervix:smooth and bulbous, uterus: normal size, shape and contour, non tender, no masses felt, adnexa: no masses or tenderness noted. Wet prep: + for clue cells and +WBCs.  Assessment:    Vaginal irritation BV    Plan:     Rx flagyl 500 mg 1 bid x 7 days, no alcohol, review handout on BV   Follow up prn

## 2013-08-29 NOTE — Patient Instructions (Signed)
Bacterial Vaginosis Bacterial vaginosis is a vaginal infection that occurs when the normal balance of bacteria in the vagina is disrupted. It results from an overgrowth of certain bacteria. This is the most common vaginal infection in women of childbearing age. Treatment is important to prevent complications, especially in pregnant women, as it can cause a premature delivery. CAUSES  Bacterial vaginosis is caused by an increase in harmful bacteria that are normally present in smaller amounts in the vagina. Several different kinds of bacteria can cause bacterial vaginosis. However, the reason that the condition develops is not fully understood. RISK FACTORS Certain activities or behaviors can put you at an increased risk of developing bacterial vaginosis, including:  Having a new sex partner or multiple sex partners.  Douching.  Using an intrauterine device (IUD) for contraception. Women do not get bacterial vaginosis from toilet seats, bedding, swimming pools, or contact with objects around them. SIGNS AND SYMPTOMS  Some women with bacterial vaginosis have no signs or symptoms. Common symptoms include:  Grey vaginal discharge.  A fishlike odor with discharge, especially after sexual intercourse.  Itching or burning of the vagina and vulva.  Burning or pain with urination. DIAGNOSIS  Your health care provider will take a medical history and examine the vagina for signs of bacterial vaginosis. A sample of vaginal fluid may be taken. Your health care provider will look at this sample under a microscope to check for bacteria and abnormal cells. A vaginal pH test may also be done.  TREATMENT  Bacterial vaginosis may be treated with antibiotic medicines. These may be given in the form of a pill or a vaginal cream. A second round of antibiotics may be prescribed if the condition comes back after treatment.  HOME CARE INSTRUCTIONS   Only take over-the-counter or prescription medicines as  directed by your health care provider.  If antibiotic medicine was prescribed, take it as directed. Make sure you finish it even if you start to feel better.  Do not have sex until treatment is completed.  Tell all sexual partners that you have a vaginal infection. They should see their health care provider and be treated if they have problems, such as a mild rash or itching.  Practice safe sex by using condoms and only having one sex partner. SEEK MEDICAL CARE IF:   Your symptoms are not improving after 3 days of treatment.  You have increased discharge or pain.  You have a fever. MAKE SURE YOU:   Understand these instructions.  Will watch your condition.  Will get help right away if you are not doing well or get worse. FOR MORE INFORMATION  Centers for Disease Control and Prevention, Division of STD Prevention: [employer] American Sexual Health Association (ASHA): www.ashastd.org  Document Released: 04/04/2005 Document Revised: 01/23/2013 Document Reviewed: 11/14/2012 [facility] Patient Information 2014 [place]. Follow up prn

## 2014-01-28 ENCOUNTER — Ambulatory Visit: Payer: Medicaid Other | Admitting: Adult Health

## 2014-01-28 ENCOUNTER — Encounter: Payer: Self-pay | Admitting: *Deleted

## 2014-02-17 ENCOUNTER — Encounter: Payer: Self-pay | Admitting: Adult Health

## 2014-03-04 ENCOUNTER — Telehealth: Payer: Self-pay | Admitting: Adult Health

## 2014-03-04 MED ORDER — NORGESTIM-ETH ESTRAD TRIPHASIC 0.18/0.215/0.25 MG-35 MCG PO TABS: 1.0000 | ORAL_TABLET | Freq: Every day | ORAL | Status: DC

## 2014-03-04 NOTE — Telephone Encounter (Signed)
Refilled OCs 

## 2014-03-04 NOTE — Telephone Encounter (Signed)
Left message x 1. [name] 

## 2014-03-04 NOTE — Telephone Encounter (Signed)
Spoke with pt. She is requesting a refill on her birth control pill, Tri-Linyah. She has an appt scheduled 03/24/14. Thanks!! [place]

## 2014-03-24 ENCOUNTER — Other Ambulatory Visit: Payer: Medicaid Other | Admitting: Adult Health

## 2014-04-16 ENCOUNTER — Encounter: Payer: Self-pay | Admitting: Adult Health

## 2014-04-16 ENCOUNTER — Ambulatory Visit (INDEPENDENT_AMBULATORY_CARE_PROVIDER_SITE_OTHER): Payer: Medicaid Other | Admitting: Adult Health

## 2014-04-16 VITALS — BP 122/72 | HR 78 | Ht 65.25 in | Wt 216.0 lb

## 2014-04-16 DIAGNOSIS — Z1212 Encounter for screening for malignant neoplasm of rectum: Secondary | ICD-10-CM

## 2014-04-16 DIAGNOSIS — Z0000 Encounter for general adult medical examination without abnormal findings: Secondary | ICD-10-CM

## 2014-04-16 DIAGNOSIS — J0110 Acute frontal sinusitis, unspecified: Secondary | ICD-10-CM

## 2014-04-16 DIAGNOSIS — Z3041 Encounter for surveillance of contraceptive pills: Secondary | ICD-10-CM

## 2014-04-16 DIAGNOSIS — Z01419 Encounter for gynecological examination (general) (routine) without abnormal findings: Secondary | ICD-10-CM

## 2014-04-16 LAB — HEMOCCULT GUIAC POC 1CARD (OFFICE): Fecal Occult Blood, POC: NEGATIVE

## 2014-04-16 MED ORDER — NORGESTIM-ETH ESTRAD TRIPHASIC 0.18/0.215/0.25 MG-35 MCG PO TABS: 1.0000 | ORAL_TABLET | Freq: Every day | ORAL | Status: DC

## 2014-04-16 MED ORDER — AZITHROMYCIN 250 MG PO TABS: ORAL_TABLET | ORAL | Status: DC

## 2014-04-16 NOTE — Progress Notes (Addendum)
Patient ID: Marie Mills, female   DOB: 01/12/1971, 43 y.o.   MRN: [id number] History of Present Illness: Marie Mills is a 43 year old Hispanic female in for gyn exam and BC refill.She complains of pressure and pain in face, and ears with cough.Called PCP, he was out of office. She has lost 32 lbs since May by walking.  Current Medications, Allergies, Past Medical History, Past Surgical History, Family History and Social History were reviewed in [employer] record.     Review of Systems: Patient denies any headaches, blurred vision, shortness of breath, chest pain, abdominal pain, problems with bowel movements, urination, or intercourse. No joint pain or mood swings, see HPI for positives.    Physical Exam:BP 122/72 mmHg  Pulse 78  Ht 5' 5.25" (1.657 m)  Wt 216 lb (97.977 kg)  BMI 35.68 kg/m2  LMP 04/02/2014 General:  Well developed, well nourished, no acute distress Skin:  Warm and dry Neck:  Midline trachea, normal thyroid, has positive tenderness in frontal, maxillary and ethmoid sinuses, ears clear, throat red no swelling or pustules Lungs; Clear to auscultation bilaterally Breast:  No dominant palpable mass, retraction, or nipple discharge Cardiovascular: Regular rate and rhythm Abdomen:  Soft, non tender, no hepatosplenomegaly Pelvic:  External genitalia is normal in appearance.  The vagina is normal in appearance. The cervix is bulbous.  Uterus is felt to be normal size, shape, and contour.  No adnexal masses or tenderness noted. Rectal: Good sphincter tone, no polyps, or hemorrhoids felt.  Hemoccult negative. Extremities:  No swelling or varicosities noted Psych:  No mood changes,alert and cooperative,seems happy   Impression:  Well woman gyn exam no pap Contraceptive management Sinus infection    Plan: Refilled tri-linyah x 1 year Rx Z pack Try delsym and any cough drop.also try honey ,lemon and hot water Pap and physical in 1 year Get mammogram now  and yearly Increase fluids and rest

## 2014-04-16 NOTE — Patient Instructions (Signed)
Increase fluids Take Z pack Try lemon and honey and delsym any cough drop Increase rest Physical and pap in 1 year Get mammogram

## 2014-04-22 ENCOUNTER — Telehealth: Payer: Self-pay | Admitting: Adult Health

## 2014-04-22 MED ORDER — NITROFURANTOIN MONOHYD MACRO 100 MG PO CAPS: 100.0000 mg | ORAL_CAPSULE | Freq: Two times a day (BID) | ORAL | Status: DC

## 2014-04-22 NOTE — Telephone Encounter (Signed)
Spoke with pt. Pt was put on an antibiotic for sinus infection. She is now having low back pain, urinary frequency, burning with urination. Symptoms started last night. Pt states this has happened before when she was on antibiotics. Can you order med? Thanks!! [place]

## 2014-04-22 NOTE — Telephone Encounter (Signed)
Finished Zpack yesterday has UTI symptoms will call in macrobid, if not better make appt

## 2014-09-22 ENCOUNTER — Other Ambulatory Visit: Payer: Self-pay | Admitting: Adult Health

## 2015-04-06 ENCOUNTER — Other Ambulatory Visit: Payer: Self-pay | Admitting: Adult Health

## 2015-04-22 ENCOUNTER — Other Ambulatory Visit: Payer: Medicaid Other | Admitting: Adult Health

## 2015-04-29 ENCOUNTER — Other Ambulatory Visit: Payer: Medicaid Other | Admitting: Adult Health

## 2015-05-13 ENCOUNTER — Other Ambulatory Visit: Payer: Medicaid Other | Admitting: Adult Health

## 2015-05-26 ENCOUNTER — Ambulatory Visit (INDEPENDENT_AMBULATORY_CARE_PROVIDER_SITE_OTHER): Payer: Medicaid Other | Admitting: Adult Health

## 2015-05-26 ENCOUNTER — Other Ambulatory Visit (HOSPITAL_COMMUNITY)
Admission: RE | Admit: 2015-05-26 | Discharge: 2015-05-26 | Disposition: A | Discharge status: Home / Self Care | Payer: Medicaid Other | Source: Ambulatory Visit | Attending: Adult Health | Admitting: Adult Health

## 2015-05-26 ENCOUNTER — Encounter: Payer: Self-pay | Admitting: Adult Health

## 2015-05-26 VITALS — BP 120/70 | HR 74 | Ht 66.0 in | Wt 239.0 lb

## 2015-05-26 DIAGNOSIS — Z3041 Encounter for surveillance of contraceptive pills: Secondary | ICD-10-CM

## 2015-05-26 DIAGNOSIS — Z309 Encounter for contraceptive management, unspecified: Secondary | ICD-10-CM | POA: Diagnosis not present

## 2015-05-26 DIAGNOSIS — Z113 Encounter for screening for infections with a predominantly sexual mode of transmission: Secondary | ICD-10-CM | POA: Insufficient documentation

## 2015-05-26 DIAGNOSIS — Z1151 Encounter for screening for human papillomavirus (HPV): Secondary | ICD-10-CM | POA: Insufficient documentation

## 2015-05-26 DIAGNOSIS — Z01419 Encounter for gynecological examination (general) (routine) without abnormal findings: Secondary | ICD-10-CM | POA: Insufficient documentation

## 2015-05-26 LAB — HEMOCCULT GUIAC POC 1CARD (OFFICE): Fecal Occult Blood, POC: NEGATIVE

## 2015-05-26 MED ORDER — NORGESTIM-ETH ESTRAD TRIPHASIC 0.18/0.215/0.25 MG-35 MCG PO TABS: 1.0000 | ORAL_TABLET | Freq: Every day | ORAL | Status: DC

## 2015-05-26 NOTE — Patient Instructions (Signed)
Physical in 1 year, pap in 3 if normal Mammogram now yearly

## 2015-05-26 NOTE — Progress Notes (Signed)
Patient ID: Marie Mills, female   DOB: Feb 23, 1971, 45 y.o.   MRN: [id number] History of Present Illness: Marie Mills is a 45 year old White female in for a well woman gyn exam and pap and refills OCs.No complaints, is working 3rd shift at [employer], she declines the flu shot. PCP is Dr Zada Girt.   Current Medications, Allergies, Past Medical History, Past Surgical History, Family History and Social History were reviewed in [employer] record.     Review of Systems: Patient denies any headaches, hearing loss, fatigue, blurred vision, shortness of breath, chest pain, abdominal pain, problems with bowel movements, urination, or intercourse. No joint pain or mood swings.    Physical Exam:BP 120/70 mmHg  Pulse 74  Ht 5\' 6"  (1.676 m)  Wt 239 lb (108.41 kg)  BMI 38.59 kg/m2  LMP 04/26/2015 General:  Well developed, well nourished, no acute distress Skin:  Warm and dry, numerous tattoos Neck:  Midline trachea, normal thyroid, good ROM, no lymphadenopathy Lungs; Clear to auscultation bilaterally Breast:  No dominant palpable mass, retraction, or nipple discharge Cardiovascular: Regular rate and rhythm Abdomen:  Soft, non tender, no hepatosplenomegaly Pelvic:  External genitalia is normal in appearance, no lesions.  The vagina is normal in appearance. Urethra has no lesions or masses. The cervix is bulbous, Pap with HPV and GC/CHL. Uterus is felt to be normal size, shape, and contour.  No adnexal masses or tenderness noted.Bladder is non tender, no masses felt. Rectal: Good sphincter tone, no polyps, or hemorrhoids felt.  Hemoccult negative. Extremities/musculoskeletal:  No swelling or varicosities noted, no clubbing or cyanosis Psych:  No mood changes, alert and cooperative,seems happy   Impression: Well woman gyn exam and pap Contraceptive management    Plan: Refilled Tri-linyah x 1 year Check HIV,RPR and HSV 2 Get mammogram now and yearly

## 2015-05-27 LAB — RPR: RPR Ser Ql: NONREACTIVE

## 2015-05-27 LAB — HSV 2 ANTIBODY, IGG: HSV 2 Glycoprotein G Ab, IgG: <0.91 index (ref 0.00–0.90)

## 2015-05-27 LAB — HIV ANTIBODY (ROUTINE TESTING W REFLEX): HIV Screen 4th Generation wRfx: NONREACTIVE

## 2015-06-01 ENCOUNTER — Telehealth: Payer: Self-pay | Admitting: Adult Health

## 2015-06-01 LAB — CYTOLOGY - PAP

## 2015-06-01 NOTE — Telephone Encounter (Signed)
Left message pap negative with negative GC/CHL but +HPV, repeat pap in 1 year

## 2015-06-09 ENCOUNTER — Emergency Department (HOSPITAL_COMMUNITY): Payer: No Typology Code available for payment source

## 2015-06-09 ENCOUNTER — Emergency Department (HOSPITAL_COMMUNITY)
Admission: EM | Admit: 2015-06-09 | Discharge: 2015-06-09 | Disposition: A | Discharge status: Home / Self Care | Payer: No Typology Code available for payment source | Attending: Emergency Medicine | Admitting: Emergency Medicine

## 2015-06-09 ENCOUNTER — Encounter (HOSPITAL_COMMUNITY): Payer: Self-pay | Admitting: Emergency Medicine

## 2015-06-09 DIAGNOSIS — Z880 Allergy status to penicillin: Secondary | ICD-10-CM | POA: Insufficient documentation

## 2015-06-09 DIAGNOSIS — Z8742 Personal history of other diseases of the female genital tract: Secondary | ICD-10-CM | POA: Diagnosis not present

## 2015-06-09 DIAGNOSIS — Y9389 Activity, other specified: Secondary | ICD-10-CM | POA: Insufficient documentation

## 2015-06-09 DIAGNOSIS — Z79899 Other long term (current) drug therapy: Secondary | ICD-10-CM | POA: Insufficient documentation

## 2015-06-09 DIAGNOSIS — I10 Essential (primary) hypertension: Secondary | ICD-10-CM | POA: Insufficient documentation

## 2015-06-09 DIAGNOSIS — S3991XA Unspecified injury of abdomen, initial encounter: Secondary | ICD-10-CM | POA: Insufficient documentation

## 2015-06-09 DIAGNOSIS — S0990XA Unspecified injury of head, initial encounter: Secondary | ICD-10-CM | POA: Insufficient documentation

## 2015-06-09 DIAGNOSIS — S298XXA Other specified injuries of thorax, initial encounter: Secondary | ICD-10-CM

## 2015-06-09 DIAGNOSIS — Z793 Long term (current) use of hormonal contraceptives: Secondary | ICD-10-CM | POA: Insufficient documentation

## 2015-06-09 DIAGNOSIS — Y998 Other external cause status: Secondary | ICD-10-CM | POA: Insufficient documentation

## 2015-06-09 DIAGNOSIS — R911 Solitary pulmonary nodule: Secondary | ICD-10-CM | POA: Diagnosis not present

## 2015-06-09 DIAGNOSIS — Y92410 Unspecified street and highway as the place of occurrence of the external cause: Secondary | ICD-10-CM | POA: Diagnosis not present

## 2015-06-09 DIAGNOSIS — S299XXA Unspecified injury of thorax, initial encounter: Secondary | ICD-10-CM | POA: Insufficient documentation

## 2015-06-09 DIAGNOSIS — Z3202 Encounter for pregnancy test, result negative: Secondary | ICD-10-CM | POA: Diagnosis not present

## 2015-06-09 DIAGNOSIS — S8011XA Contusion of right lower leg, initial encounter: Secondary | ICD-10-CM | POA: Diagnosis not present

## 2015-06-09 DIAGNOSIS — E669 Obesity, unspecified: Secondary | ICD-10-CM | POA: Diagnosis not present

## 2015-06-09 LAB — CBC WITH DIFFERENTIAL/PLATELET
Basophils Absolute: 0 10*3/uL (ref 0.0–0.1)
Basophils Relative: 0 %
Eosinophils Absolute: 0.1 10*3/uL (ref 0.0–0.7)
Eosinophils Relative: 1 %
HCT: 36.3 % (ref 36.0–46.0)
Hemoglobin: 11.4 g/dL — ABNORMAL LOW (ref 12.0–15.0)
Lymphocytes Relative: 22 %
Lymphs Abs: 2.2 10*3/uL (ref 0.7–4.0)
MCH: 25.7 pg — ABNORMAL LOW (ref 26.0–34.0)
MCHC: 31.4 g/dL (ref 30.0–36.0)
MCV: 81.9 fL (ref 78.0–100.0)
Monocytes Absolute: 0.7 10*3/uL (ref 0.1–1.0)
Monocytes Relative: 7 %
Neutro Abs: 6.8 10*3/uL (ref 1.7–7.7)
Neutrophils Relative %: 70 %
Platelets: 227 10*3/uL (ref 150–400)
RBC: 4.43 MIL/uL (ref 3.87–5.11)
RDW: 14 % (ref 11.5–15.5)
WBC: 9.9 10*3/uL (ref 4.0–10.5)

## 2015-06-09 LAB — BASIC METABOLIC PANEL
Anion gap: 8 (ref 5–15)
BUN: 18 mg/dL (ref 6–20)
CO2: 20 mmol/L — ABNORMAL LOW (ref 22–32)
Calcium: 8 mg/dL — ABNORMAL LOW (ref 8.9–10.3)
Chloride: 111 mmol/L (ref 101–111)
Creatinine, Ser: 0.6 mg/dL (ref 0.44–1.00)
GFR calc Af Amer: >60 mL/min (ref >60)
GFR calc non Af Amer: >60 mL/min (ref >60)
Glucose, Bld: 91 mg/dL (ref 65–99)
Potassium: 3.7 mmol/L (ref 3.5–5.1)
Sodium: 139 mmol/L (ref 135–145)

## 2015-06-09 LAB — I-STAT BETA HCG BLOOD, ED (MC, WL, AP ONLY): I-stat hCG, quantitative: <5 m[IU]/mL (ref <5)

## 2015-06-09 LAB — TROPONIN I: Troponin I: <0.03 ng/mL (ref <0.031)

## 2015-06-09 MED ORDER — IOHEXOL 300 MG/ML  SOLN
100.0000 mL | Freq: Once | INTRAMUSCULAR | Status: AC | PRN
  Administered 2015-06-09: 100 mL via INTRAVENOUS

## 2015-06-09 MED ORDER — FENTANYL CITRATE (PF) 100 MCG/2ML IJ SOLN
50.0000 ug | Freq: Once | INTRAMUSCULAR | Status: AC
  Administered 2015-06-09: 50 ug via INTRAVENOUS
  Filled 2015-06-09: qty 2

## 2015-06-09 MED ORDER — SODIUM CHLORIDE 0.9 % IV BOLUS (SEPSIS)
1000.0000 mL | Freq: Once | INTRAVENOUS | Status: AC
  Administered 2015-06-09: 1000 mL via INTRAVENOUS

## 2015-06-09 MED ORDER — ONDANSETRON HCL 4 MG/2ML IJ SOLN
4.0000 mg | Freq: Once | INTRAMUSCULAR | Status: AC
  Administered 2015-06-09: 4 mg via INTRAVENOUS
  Filled 2015-06-09: qty 2

## 2015-06-09 MED ORDER — IBUPROFEN 600 MG PO TABS: 600.0000 mg | ORAL_TABLET | Freq: Four times a day (QID) | ORAL | Status: DC | PRN

## 2015-06-09 MED ORDER — ONDANSETRON 8 MG PO TBDP
8.0000 mg | ORAL_TABLET | Freq: Once | ORAL | Status: AC
  Administered 2015-06-09: 8 mg via ORAL
  Filled 2015-06-09: qty 1

## 2015-06-09 MED ORDER — KETOROLAC TROMETHAMINE 30 MG/ML IJ SOLN
30.0000 mg | Freq: Once | INTRAMUSCULAR | Status: AC
  Administered 2015-06-09: 30 mg via INTRAVENOUS
  Filled 2015-06-09: qty 1

## 2015-06-09 MED ORDER — HYDROMORPHONE HCL 1 MG/ML IJ SOLN
1.0000 mg | Freq: Once | INTRAMUSCULAR | Status: AC
  Administered 2015-06-09: 1 mg via INTRAVENOUS
  Filled 2015-06-09: qty 1

## 2015-06-09 MED ORDER — MORPHINE SULFATE (PF) 4 MG/ML IV SOLN
4.0000 mg | Freq: Once | INTRAVENOUS | Status: AC
  Administered 2015-06-09: 4 mg via INTRAVENOUS
  Filled 2015-06-09: qty 1

## 2015-06-09 NOTE — ED Notes (Signed)
Pt sitting up on side of bed.  C/o nausea.  States "I know something is wrong with my chest" .  After sitting up for a few minutes.  Pt states she feels like she is going to pass out.  layed pt back down.  VSS.

## 2015-06-09 NOTE — Discharge Instructions (Signed)
Blunt Chest Trauma °Blunt chest trauma is an injury caused by a blow to the chest. These chest injuries can be very painful. Blunt chest trauma often results in bruised or broken (fractured) ribs. Most cases of bruised and fractured ribs from blunt chest traumas get better after 1 to 3 weeks of rest and pain medicine. Often, the soft tissue in the chest wall is also injured, causing pain and bruising. Internal organs, such as the heart and lungs, may also be injured. Blunt chest trauma can lead to serious medical problems. This injury requires immediate medical care. °CAUSES  °· Motor vehicle collisions. °· Falls. °· Physical violence. °· Sports injuries. °SYMPTOMS  °· Chest pain. The pain may be worse when you move or breathe deeply. °· Shortness of breath. °· Lightheadedness. °· Bruising. °· Tenderness. °· Swelling. °DIAGNOSIS  °Your caregiver will do a physical exam. X-rays may be taken to look for fractures. However, minor rib fractures may not show up on X-rays until a few days after the injury. If a more serious injury is suspected, further imaging tests may be done. This may include ultrasounds, computed tomography (CT) scans, or magnetic resonance imaging (MRI). °TREATMENT  °Treatment depends on the severity of your injury. Your caregiver may prescribe pain medicines and deep breathing exercises. °HOME CARE INSTRUCTIONS °· Limit your activities until you can move around without much pain. °· Do not do any strenuous work until your injury is healed. °· Put ice on the injured area. °¨ Put ice in a plastic bag. °¨ Place a towel between your skin and the bag. °¨ Leave the ice on for 15-20 minutes, 03-04 times a day. °· You may wear a rib belt as directed by your caregiver to reduce pain. °· Practice deep breathing as directed by your caregiver to keep your lungs clear. °· Only take over-the-counter or prescription medicines for pain, fever, or discomfort as directed by your caregiver. °SEEK IMMEDIATE MEDICAL  CARE IF:  °· You have increasing pain or shortness of breath. °· You cough up blood. °· You have nausea, vomiting, or abdominal pain. °· You have a fever. °· You feel dizzy, weak, or you faint. °MAKE SURE YOU: °· Understand these instructions. °· Will watch your condition. °· Will get help right away if you are not doing well or get worse. °  °This information is not intended to replace advice given to you by your health care provider. Make sure you discuss any questions you have with your health care provider. °  °Document Released: 05/12/2004 Document Revised: 04/25/2014 Document Reviewed: 10/01/2014 °Elsevier Interactive Patient Education ©2016 [employer] ° °
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
## 2015-06-09 NOTE — ED Notes (Signed)
Dr. Christy Gentles notified of pt feeling like she was going to pass out and of pt ongoing chest pain.

## 2015-06-09 NOTE — ED Notes (Signed)
C/o "crushing chest pain,  Feels like a heart attack.  States I have this pain after codeine. "  Repeat EKG done and given to Dr. Christy Gentles.
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
## 2015-06-09 NOTE — ED Notes (Signed)
Pt continues to have ongoing left sided chest pain.  Dr. Christy Gentles in to reassess pt.  Orders received.

## 2015-06-09 NOTE — ED Notes (Signed)
Pt waiting to be eval by EDP

## 2015-06-11 ENCOUNTER — Emergency Department (HOSPITAL_COMMUNITY)
Admission: EM | Admit: 2015-06-11 | Discharge: 2015-06-11 | Disposition: A | Discharge status: Home / Self Care | Payer: No Typology Code available for payment source | Attending: Emergency Medicine | Admitting: Emergency Medicine

## 2015-06-11 ENCOUNTER — Encounter (HOSPITAL_COMMUNITY): Payer: Self-pay | Admitting: Emergency Medicine

## 2015-06-11 ENCOUNTER — Emergency Department (HOSPITAL_COMMUNITY): Payer: No Typology Code available for payment source

## 2015-06-11 DIAGNOSIS — S4992XA Unspecified injury of left shoulder and upper arm, initial encounter: Secondary | ICD-10-CM | POA: Insufficient documentation

## 2015-06-11 DIAGNOSIS — Y998 Other external cause status: Secondary | ICD-10-CM | POA: Insufficient documentation

## 2015-06-11 DIAGNOSIS — S301XXA Contusion of abdominal wall, initial encounter: Secondary | ICD-10-CM | POA: Diagnosis not present

## 2015-06-11 DIAGNOSIS — E669 Obesity, unspecified: Secondary | ICD-10-CM | POA: Insufficient documentation

## 2015-06-11 DIAGNOSIS — Z880 Allergy status to penicillin: Secondary | ICD-10-CM | POA: Insufficient documentation

## 2015-06-11 DIAGNOSIS — I10 Essential (primary) hypertension: Secondary | ICD-10-CM | POA: Diagnosis not present

## 2015-06-11 DIAGNOSIS — Y92410 Unspecified street and highway as the place of occurrence of the external cause: Secondary | ICD-10-CM | POA: Insufficient documentation

## 2015-06-11 DIAGNOSIS — Z793 Long term (current) use of hormonal contraceptives: Secondary | ICD-10-CM | POA: Insufficient documentation

## 2015-06-11 DIAGNOSIS — S3992XA Unspecified injury of lower back, initial encounter: Secondary | ICD-10-CM | POA: Insufficient documentation

## 2015-06-11 DIAGNOSIS — Z8742 Personal history of other diseases of the female genital tract: Secondary | ICD-10-CM | POA: Insufficient documentation

## 2015-06-11 DIAGNOSIS — S2232XA Fracture of one rib, left side, initial encounter for closed fracture: Secondary | ICD-10-CM

## 2015-06-11 DIAGNOSIS — Y9389 Activity, other specified: Secondary | ICD-10-CM | POA: Insufficient documentation

## 2015-06-11 DIAGNOSIS — Z79899 Other long term (current) drug therapy: Secondary | ICD-10-CM | POA: Insufficient documentation

## 2015-06-11 DIAGNOSIS — S2242XA Multiple fractures of ribs, left side, initial encounter for closed fracture: Secondary | ICD-10-CM | POA: Diagnosis not present

## 2015-06-11 DIAGNOSIS — S299XXA Unspecified injury of thorax, initial encounter: Secondary | ICD-10-CM | POA: Diagnosis present

## 2015-06-11 MED ORDER — HYDROMORPHONE HCL 1 MG/ML IJ SOLN
1.0000 mg | Freq: Once | INTRAMUSCULAR | Status: AC
  Administered 2015-06-11: 1 mg via INTRAMUSCULAR
  Filled 2015-06-11: qty 1

## 2015-06-11 MED ORDER — KETOROLAC TROMETHAMINE 60 MG/2ML IM SOLN
60.0000 mg | Freq: Once | INTRAMUSCULAR | Status: AC
  Administered 2015-06-11: 60 mg via INTRAMUSCULAR
  Filled 2015-06-11: qty 2

## 2015-06-11 MED ORDER — HYDROCODONE-ACETAMINOPHEN 5-325 MG PO TABS: 1.0000 | ORAL_TABLET | ORAL | Status: DC | PRN

## 2015-06-11 MED ORDER — ONDANSETRON HCL 4 MG PO TABS
4.0000 mg | ORAL_TABLET | Freq: Once | ORAL | Status: AC
  Administered 2015-06-11: 4 mg via ORAL
  Filled 2015-06-11: qty 1

## 2015-06-11 MED ORDER — DIAZEPAM 5 MG PO TABS
5.0000 mg | ORAL_TABLET | Freq: Once | ORAL | Status: AC
  Administered 2015-06-11: 5 mg via ORAL
  Filled 2015-06-11: qty 1

## 2015-06-11 MED ORDER — CYCLOBENZAPRINE HCL 10 MG PO TABS: 10.0000 mg | ORAL_TABLET | Freq: Two times a day (BID) | ORAL | Status: DC | PRN
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
## 2015-06-11 NOTE — ED Notes (Signed)
PA at bedside.
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
## 2016-02-20 ENCOUNTER — Emergency Department (HOSPITAL_COMMUNITY)
Admission: EM | Admit: 2016-02-20 | Discharge: 2016-02-20 | Disposition: A | Discharge status: Home / Self Care | Payer: Self-pay | Attending: Emergency Medicine | Admitting: Emergency Medicine

## 2016-02-20 ENCOUNTER — Encounter (HOSPITAL_COMMUNITY): Payer: Self-pay | Admitting: Emergency Medicine

## 2016-02-20 ENCOUNTER — Emergency Department (HOSPITAL_COMMUNITY): Payer: Self-pay

## 2016-02-20 DIAGNOSIS — Z79899 Other long term (current) drug therapy: Secondary | ICD-10-CM | POA: Insufficient documentation

## 2016-02-20 DIAGNOSIS — I10 Essential (primary) hypertension: Secondary | ICD-10-CM | POA: Insufficient documentation

## 2016-02-20 DIAGNOSIS — M5432 Sciatica, left side: Secondary | ICD-10-CM | POA: Insufficient documentation

## 2016-02-20 LAB — POC URINE PREG, ED: Preg Test, Ur: NEGATIVE

## 2016-02-20 MED ORDER — PREDNISONE 10 MG PO TABS: ORAL_TABLET | ORAL | 0 refills | Status: DC

## 2016-02-20 MED ORDER — HYDROCODONE-ACETAMINOPHEN 5-325 MG PO TABS: 2.0000 | ORAL_TABLET | ORAL | 0 refills | Status: DC | PRN

## 2016-02-20 NOTE — ED Notes (Signed)
Pt reports L hip pain, has hx of bilateral hip pain, denies injury, bowel/bladder dysfunction.

## 2016-02-20 NOTE — ED Triage Notes (Signed)
Pt reports L hip pain with radiation down her L leg. Pt denies back pain or back injury. Onset of symptoms 4 days ago.

## 2016-02-21 NOTE — ED Provider Notes (Signed)
[facility] DEPT Provider Note   CSN: [id number] Arrival date & time: 02/20/16  1202     History   Chief Complaint Chief Complaint  Patient presents with  . Hip Pain    HPI Marie Mills is a 45 y.o. female.  The history is provided by the patient. No language interpreter was used.  Hip Pain  This is a new problem. The problem occurs constantly. The problem has been gradually worsening. Pertinent negatives include no chest pain, no abdominal pain, no headaches and no shortness of breath. Nothing aggravates the symptoms. Nothing relieves the symptoms. She has tried nothing for the symptoms. The treatment provided no relief.  Pt reports pain in her left hip  No injury Pain started 4 days ago  Past Medical History:  Diagnosis Date  . BV (bacterial vaginosis) 08/29/2013  . Contraceptive management 03/21/2013  . Hypertension   . Obesity   . Vaginal irritation 08/29/2013    Patient Active Problem List   Diagnosis Date Noted  . Vaginal irritation 08/29/2013  . BV (bacterial vaginosis) 08/29/2013  . Hypertension 03/21/2013  . Contraceptive management 03/21/2013    Past Surgical History:  Procedure Laterality Date  . CHOLECYSTECTOMY      OB History    Gravida Para Term Preterm AB Living   4 3     1 3    SAB TAB Ectopic Multiple Live Births     1     3       Home Medications    Prior to Admission medications   Medication Sig Start Date End Date Taking? Authorizing Provider  amLODipine (NORVASC) 5 MG tablet Take 5 mg by mouth daily.   Yes Historical Provider, MD  losartan-hydrochlorothiazide (HYZAAR) 100-25 MG per tablet Take 1 tablet by mouth daily.  01/31/14  Yes Historical Provider, MD  Norgestimate-Ethinyl Estradiol Triphasic (TRI-LINYAH) 0.18/0.215/0.25 MG-35 MCG tablet Take 1 tablet by mouth daily. 05/26/15  Yes Estill Dooms, NP  ranitidine (ZANTAC) 300 MG tablet Take 1 tablet by mouth 2 (two) times daily. 02/17/16  Yes Historical Provider, MD    HYDROcodone-acetaminophen (NORCO/VICODIN) 5-325 MG tablet Take 2 tablets by mouth every 4 (four) hours as needed. 02/20/16   Fransico Meadow, PA-C  predniSONE (DELTASONE) 10 MG tablet 6,5,4,3,2,1 taper 02/20/16   Fransico Meadow, PA-C    Family History Family History  Problem Relation Age of Onset  . Hypertension Mother   . Hyperlipidemia Mother   . Diabetes Mother   . Hyperlipidemia Father   . Hypertension Father   . Diabetes Father   . Seizures Father   . Cancer Maternal Aunt     breast  . Diabetes Sister   . Hypertension Sister   . Diabetes Sister   . Hypertension Sister   . Diabetes Brother   . Hypertension Brother   . Hypertension Sister   . Asthma Son   . Asthma Daughter     Social History Social History  Substance Use Topics  . Smoking status: Never Smoker  . Smokeless tobacco: Never Used  . Alcohol use No     Allergies   Penicillins; Codeine; and Sulfa antibiotics   Review of Systems Review of Systems  Respiratory: Negative for shortness of breath.   Cardiovascular: Negative for chest pain.  Gastrointestinal: Negative for abdominal pain.  Neurological: Negative for headaches.  All other systems reviewed and are negative.    Physical Exam Updated Vital Signs BP 140/75 (BP Location: Left Arm)   Pulse 71  Temp 98 F (36.7 C) (Oral)   Resp 18   Ht 5\' 6"  (1.676 m)   Wt 90.7 kg   LMP 02/06/2016   SpO2 100%   BMI 32.28 kg/m   Physical Exam  Constitutional: She is oriented to person, place, and time. She appears well-developed and well-nourished.  HENT:  Head: Normocephalic.  Eyes: EOM are normal.  Neck: Normal range of motion.  Pulmonary/Chest: Effort normal.  Abdominal: She exhibits no distension.  Musculoskeletal: She exhibits tenderness.  Tender left hip and left sciatic area from ns and nv intact  Neurological: She is alert and oriented to person, place, and time.  Skin: Skin is warm.  Psychiatric: She has a normal mood and affect.   Nursing note and vitals reviewed.    ED Treatments / Results  Labs (all labs ordered are listed, but only abnormal results are displayed) Labs Reviewed  POC URINE PREG, ED    EKG  EKG Interpretation None       Radiology Dg Hip Unilat W Or Wo Pelvis 2-3 Views Left  Result Date: 02/20/2016 CLINICAL DATA:  Left hip pain for 4 days. EXAM: DG HIP (WITH OR WITHOUT PELVIS) 2-3V LEFT COMPARISON:  None. FINDINGS: Pelvic bony ring is intact. Left hip is located without a fracture. No gross abnormality to the right hip. Gas and stool in the colon. IMPRESSION: No acute abnormality. Electronically Signed   By: Markus Daft M.D.   On: 02/20/2016 13:39    Procedures Procedures (including critical care time)  Medications Ordered in ED Medications - No data to display   Initial Impression / Assessment and Plan / ED Course  I have reviewed the triage vital signs and the nursing notes.  Pertinent labs & imaging results that were available during my care of the patient were reviewed by me and considered in my medical decision making (see chart for details).  Clinical Course as of Feb 21 843  Sat Feb 20, 2016  1347 DG Hip Malvin Johns or Wo Pelvis 2-3 Views Left [LS]    Clinical Course User Index [LS] Fransico Meadow, PA-C    Pt given rx.  I advised follow up with MD for recheck  Final Clinical Impressions(s) / ED Diagnoses   Final diagnoses:  Sciatica of left side    New Prescriptions Discharge Medication List as of 02/20/2016  2:52 PM    START taking these medications   Details  predniSONE (DELTASONE) 10 MG tablet 6,5,4,3,2,1 taper, Print      An After Visit Summary was printed and given to the patient.   [place], PA-C 02/21/16 [place], MD 02/21/16 [phone]

## 2016-05-30 ENCOUNTER — Other Ambulatory Visit (HOSPITAL_COMMUNITY)
Admission: RE | Admit: 2016-05-30 | Discharge: 2016-05-30 | Disposition: A | Discharge status: Home / Self Care | Payer: Medicaid Other | Source: Ambulatory Visit | Attending: Adult Health | Admitting: Adult Health

## 2016-05-30 ENCOUNTER — Ambulatory Visit (INDEPENDENT_AMBULATORY_CARE_PROVIDER_SITE_OTHER): Payer: Medicaid Other | Admitting: Adult Health

## 2016-05-30 ENCOUNTER — Encounter: Payer: Self-pay | Admitting: Adult Health

## 2016-05-30 VITALS — BP 130/60 | HR 72 | Ht 65.5 in | Wt 250.0 lb

## 2016-05-30 DIAGNOSIS — Z308 Encounter for other contraceptive management: Secondary | ICD-10-CM | POA: Diagnosis not present

## 2016-05-30 DIAGNOSIS — Z1151 Encounter for screening for human papillomavirus (HPV): Secondary | ICD-10-CM | POA: Insufficient documentation

## 2016-05-30 DIAGNOSIS — Z113 Encounter for screening for infections with a predominantly sexual mode of transmission: Secondary | ICD-10-CM | POA: Insufficient documentation

## 2016-05-30 DIAGNOSIS — Z3009 Encounter for other general counseling and advice on contraception: Secondary | ICD-10-CM

## 2016-05-30 DIAGNOSIS — Z1211 Encounter for screening for malignant neoplasm of colon: Secondary | ICD-10-CM | POA: Insufficient documentation

## 2016-05-30 DIAGNOSIS — R87612 Low grade squamous intraepithelial lesion on cytologic smear of cervix (LGSIL): Secondary | ICD-10-CM | POA: Insufficient documentation

## 2016-05-30 DIAGNOSIS — Z3041 Encounter for surveillance of contraceptive pills: Secondary | ICD-10-CM | POA: Insufficient documentation

## 2016-05-30 DIAGNOSIS — Z1212 Encounter for screening for malignant neoplasm of rectum: Secondary | ICD-10-CM

## 2016-05-30 DIAGNOSIS — R87810 Cervical high risk human papillomavirus (HPV) DNA test positive: Secondary | ICD-10-CM

## 2016-05-30 DIAGNOSIS — Z01419 Encounter for gynecological examination (general) (routine) without abnormal findings: Secondary | ICD-10-CM

## 2016-05-30 MED ORDER — NORGESTIM-ETH ESTRAD TRIPHASIC 0.18/0.215/0.25 MG-35 MCG PO TABS: 1.0000 | ORAL_TABLET | Freq: Every day | ORAL | 12 refills | Status: DC

## 2016-05-30 NOTE — Progress Notes (Signed)
Patient ID: Marie Mills, female   DOB: [birth year], 46 y.o.   MRN: [id number] History of Present Illness:  Anmol is a 46 year old white female in for a well woman gyn exam and pap, her pap last year had +HPV.She is happy with her OCs.?bump in vagina area. She has family planning medicaid.  PCP is Dr Zada Girt in [place].   Current Medications, Allergies, Past Medical History, Past Surgical History, Family History and Social History were reviewed in [employer] record.     Review of Systems:  Patient denies any headaches, hearing loss, fatigue, blurred vision, shortness of breath, chest pain, abdominal pain, problems with bowel movements, urination, or intercourse. No joint pain or mood swings.?bump in vagina area Happy with OCs.  Physical Exam:BP 130/60 (BP Location: Left Arm, Patient Position: Sitting, Cuff Size: Large)   Pulse 72   Ht 5' 5.5" (1.664 m)   Wt 250 lb (113.4 kg)   LMP 05/27/2016 (Exact Date)   BMI 40.97 kg/m  General:  Well developed, well nourished, no acute distress Skin:  Warm and dry,+tattoos Neck:  Midline trachea, normal thyroid, good ROM, no lymphadenopathy Lungs; Clear to auscultation bilaterally Breast:  No dominant palpable mass, retraction, or nipple discharge Cardiovascular: Regular rate and rhythm Abdomen:  Soft, non tender, no hepatosplenomegaly Pelvic:  External genitalia is normal in appearance, sebaceous cyst left labia.  The vagina is normal in appearance,with period like blood. Urethra has no lesions or masses. The cervix is bulbous,pap with HPV and GC/CHL performed.  Uterus is felt to be normal size, shape, and contour.  No adnexal masses or tenderness noted.Bladder is non tender, no masses felt. Extremities/musculoskeletal:  No swelling or varicosities noted, no clubbing or cyanosis Psych:  No mood changes, alert and cooperative,seems happy PHQ 2 score 0.  Impression: 1. Encounter for gynecological examination with Papanicolaou  smear of cervix   2. Family planning   3. Surveillance for birth control, oral contraceptives   4. 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 present       Plan: Refilled tri-linyah for 1 year, take 1 daily Check HIV and RPR Physical in 1 year Pap in 3 if normal

## 2016-05-30 NOTE — Patient Instructions (Addendum)
Physical in 1 year Pap in 3 if normal 

## 2016-05-31 LAB — CYTOLOGY - PAP
Chlamydia: NEGATIVE
Diagnosis: NEGATIVE
HPV: NOT DETECTED
Neisseria Gonorrhea: NEGATIVE

## 2016-05-31 LAB — HIV ANTIBODY (ROUTINE TESTING W REFLEX): HIV Screen 4th Generation wRfx: NONREACTIVE

## 2016-05-31 LAB — RPR: RPR Ser Ql: NONREACTIVE

## 2017-01-21 ENCOUNTER — Encounter (HOSPITAL_COMMUNITY): Payer: Self-pay | Admitting: *Deleted

## 2017-01-21 ENCOUNTER — Emergency Department (HOSPITAL_COMMUNITY)
Admission: EM | Admit: 2017-01-21 | Discharge: 2017-01-21 | Disposition: A | Discharge status: Home / Self Care | Payer: Self-pay | Attending: Emergency Medicine | Admitting: Emergency Medicine

## 2017-01-21 DIAGNOSIS — R591 Generalized enlarged lymph nodes: Secondary | ICD-10-CM | POA: Insufficient documentation

## 2017-01-21 DIAGNOSIS — Z79899 Other long term (current) drug therapy: Secondary | ICD-10-CM | POA: Insufficient documentation

## 2017-01-21 MED ORDER — CLINDAMYCIN HCL 150 MG PO CAPS: 300.0000 mg | ORAL_CAPSULE | Freq: Once | ORAL | Status: DC

## 2017-01-21 MED ORDER — IBUPROFEN 800 MG PO TABS
800.0000 mg | ORAL_TABLET | Freq: Once | ORAL | Status: AC
  Administered 2017-01-21: 800 mg via ORAL
  Filled 2017-01-21: qty 1

## 2017-01-21 MED ORDER — DOXYCYCLINE HYCLATE 100 MG PO CAPS: 100.0000 mg | ORAL_CAPSULE | Freq: Two times a day (BID) | ORAL | 0 refills | Status: DC

## 2017-01-21 MED ORDER — IBUPROFEN 600 MG PO TABS: 600.0000 mg | ORAL_TABLET | Freq: Four times a day (QID) | ORAL | 0 refills | Status: DC | PRN

## 2017-01-21 MED ORDER — DOXYCYCLINE HYCLATE 100 MG PO TABS
100.0000 mg | ORAL_TABLET | Freq: Once | ORAL | Status: AC
  Administered 2017-01-21: 100 mg via ORAL
  Filled 2017-01-21: qty 1

## 2017-01-21 MED ORDER — HYDROCODONE-ACETAMINOPHEN 5-325 MG PO TABS
1.0000 | ORAL_TABLET | Freq: Once | ORAL | Status: AC
  Administered 2017-01-21: 1 via ORAL
  Filled 2017-01-21: qty 1

## 2017-01-21 NOTE — ED Triage Notes (Signed)
Pt reports feeling a knot behind her left ear this morning. Pt states it is making her left side of her face numb.

## 2017-01-21 NOTE — ED Notes (Signed)
Pt states left facial numbness since knot behind left ear since this morning, pt denies fevers or feeling bad.  Pt states she works third shift and has not been able to sleep today.

## 2017-01-21 NOTE — Discharge Instructions (Signed)
Warm wet compresses on/off.  Follow-up with your doctor or return here for any worsening symptoms

## 2017-01-23 NOTE — ED Provider Notes (Signed)
[facility] DEPT Provider Note   CSN: [id number] Arrival date & time: 01/21/17  2004     History   Chief Complaint Chief Complaint  Patient presents with  . knot behind ear    HPI Marie Mills is a 46 y.o. female.  HPI   Marie Mills is a 46 y.o. female who presents to the Emergency Department complaining of noticing a "knot" behind her left ear.  Noticed it on the morning of arrival.  States the area is tender, aggravated by movement of the neck and states that it is making her left face feel numb and tingly.  She reports some sinus symptoms recently, but denies fever, neck stiffness, visual changes, headaches, speech changes, facial droop, ear pain, and difficulty swallowing.  No rash  Past Medical History:  Diagnosis Date  . BV (bacterial vaginosis) 08/29/2013  . Contraceptive management 03/21/2013  . Hypertension   . Obesity   . Vaginal irritation 08/29/2013    Patient Active Problem List   Diagnosis Date Noted  . 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 present 05/30/2016  . Surveillance for birth control, oral contraceptives 05/30/2016  . Family planning 05/30/2016  . Encounter for gynecological examination with Papanicolaou smear of cervix 05/30/2016  . Vaginal irritation 08/29/2013  . BV (bacterial vaginosis) 08/29/2013  . Hypertension 03/21/2013  . Contraceptive management 03/21/2013    Past Surgical History:  Procedure Laterality Date  . CHOLECYSTECTOMY      OB History    Gravida Para Term Preterm AB Living   4 3     1 3    SAB TAB Ectopic Multiple Live Births     1     3       Home Medications    Prior to Admission medications   Medication Sig Start Date End Date Taking? Authorizing Provider  benazepril (LOTENSIN) 40 MG tablet Take 40 mg by mouth daily.  05/27/16   [provider]  doxycycline (VIBRAMYCIN) 100 MG capsule Take 1 capsule (100 mg total) by mouth 2 (two) times daily. 01/21/17   Margo Lama, PA-C  ibuprofen  (ADVIL,MOTRIN) 600 MG tablet Take 1 tablet (600 mg total) by mouth every 6 (six) hours as needed. Take with food 01/21/17   Arlenne Kimbley, PA-C  losartan-hydrochlorothiazide (HYZAAR) 100-25 MG per tablet Take 1 tablet by mouth daily.  01/31/14   [provider]  Norgestimate-Ethinyl Estradiol Triphasic (TRI-LINYAH) 0.18/0.215/0.25 MG-35 MCG tablet Take 1 tablet by mouth daily. 05/30/16   Estill Dooms, NP  RA LORATADINE 10 MG tablet Take 10 mg by mouth daily.  05/01/16   [provider]  ranitidine (ZANTAC) 300 MG tablet Take 1 tablet by mouth 2 (two) times daily. 02/17/16   [provider]    Family History Family History  Problem Relation Age of Onset  . Hypertension Mother   . Hyperlipidemia Mother   . Diabetes Mother   . Hyperlipidemia Father   . Hypertension Father   . Diabetes Father   . Seizures Father   . Cancer Maternal Aunt        breast  . Diabetes Sister   . Hypertension Sister   . Diabetes Sister   . Hypertension Sister   . Diabetes Brother   . Hypertension Brother   . Hypertension Sister   . Asthma Son   . Asthma Daughter     Social History Social History  Substance Use Topics  . Smoking status: Never Smoker  . Smokeless tobacco: Never Used  .  Alcohol use No     Allergies   Penicillins; Codeine; and Sulfa antibiotics   Review of Systems Review of Systems  Constitutional: Negative for chills and fever.  HENT: Positive for sinus pressure. Negative for congestion, ear pain, facial swelling, hearing loss, sore throat, tinnitus and trouble swallowing.   Eyes: Negative for visual disturbance.  Respiratory: Negative for cough and shortness of breath.   Cardiovascular: Negative for chest pain.  Gastrointestinal: Negative for nausea and vomiting.  Musculoskeletal: Positive for myalgias. Negative for arthralgias, neck pain and neck stiffness.  Skin: Negative for rash.  Neurological: Negative for dizziness, syncope, facial  asymmetry, speech difficulty, weakness, numbness and headaches.  Hematological: Positive for adenopathy.  Psychiatric/Behavioral: Negative for confusion.     Physical Exam Updated Vital Signs BP 138/78 (BP Location: Right Arm)   Pulse 99   Temp 98.5 F (36.9 C) (Oral)   Resp 20   Ht 5\' 7"  (1.702 m)   Wt 104.3 kg (230 lb)   LMP 12/08/2016   SpO2 97%   BMI 36.02 kg/m   Physical Exam  Constitutional: She is oriented to person, place, and time. She appears well-developed and well-nourished. No distress.  HENT:  Head: Atraumatic.  Right Ear: Tympanic membrane and ear canal normal.  Left Ear: Ear canal normal. A middle ear effusion is present.  Mouth/Throat: Oropharynx is clear and moist.  No mastoid tenderness  Eyes: Pupils are equal, round, and reactive to light. Conjunctivae and EOM are normal.  Neck: Normal range of motion. Neck supple.  Cardiovascular: Normal rate, regular rhythm and intact distal pulses.   Pulmonary/Chest: Effort normal and breath sounds normal. No respiratory distress.  Musculoskeletal: Normal range of motion.  Lymphadenopathy:       Head (left side): Posterior auricular adenopathy present.    She has no cervical adenopathy.  Slightly enlarged left post auricular lymph node.    Neurological: She is alert and oriented to person, place, and time. She has normal strength. No sensory deficit. GCS eye subscore is 4. GCS verbal subscore is 5. GCS motor subscore is 6.  CN III-XII intact  Skin: Skin is warm. Capillary refill takes less than 2 seconds. No rash noted.  Nursing note and vitals reviewed.    ED Treatments / Results  Labs (all labs ordered are listed, but only abnormal results are displayed) Labs Reviewed - No data to display  EKG  EKG Interpretation None       Radiology No results found.  Procedures Procedures (including critical care time)  Medications Ordered in ED Medications  HYDROcodone-acetaminophen (NORCO/VICODIN) 5-325 MG  per tablet 1 tablet (1 tablet Oral Given 01/21/17 2105)  ibuprofen (ADVIL,MOTRIN) tablet 800 mg (800 mg Oral Given 01/21/17 2105)  doxycycline (VIBRA-TABS) tablet 100 mg (100 mg Oral Given 01/21/17 2105)     Initial Impression / Assessment and Plan / ED Course  I have reviewed the triage vital signs and the nursing notes.  Pertinent labs & imaging results that were available during my care of the patient were reviewed by me and considered in my medical decision making (see chart for details).     Pt well appearing.  No focal neuro deficits.  No facial weakness on exam.  Speech clear.  Single, tender enlarged lymph node. No mastoid tenderness or hearing deficit.  Recent sinus sx's reported.  Return precautions discussed.  Final Clinical Impressions(s) / ED Diagnoses   Final diagnoses:  Lymphadenopathy    New Prescriptions Discharge Medication List as of 01/21/2017  8:59 PM    START taking these medications   Details  doxycycline (VIBRAMYCIN) 100 MG capsule Take 1 capsule (100 mg total) by mouth 2 (two) times daily., Starting Sat 01/21/2017, Print    ibuprofen (ADVIL,MOTRIN) 600 MG tablet Take 1 tablet (600 mg total) by mouth every 6 (six) hours as needed. Take with food, Starting Sat 01/21/2017, Print         Vanessa Sheridan [place], [place] 01/23/17 1329    Noemi Chapel, MD 01/24/17 2014

## 2017-05-28 ENCOUNTER — Other Ambulatory Visit: Payer: Self-pay | Admitting: Adult Health

## 2017-05-31 ENCOUNTER — Ambulatory Visit (INDEPENDENT_AMBULATORY_CARE_PROVIDER_SITE_OTHER): Payer: Medicaid Other | Admitting: Adult Health

## 2017-05-31 ENCOUNTER — Encounter: Payer: Self-pay | Admitting: Adult Health

## 2017-05-31 VITALS — BP 154/88 | HR 93 | Ht 66.0 in | Wt 256.5 lb

## 2017-05-31 DIAGNOSIS — Z309 Encounter for contraceptive management, unspecified: Secondary | ICD-10-CM | POA: Diagnosis not present

## 2017-05-31 DIAGNOSIS — Z1211 Encounter for screening for malignant neoplasm of colon: Secondary | ICD-10-CM | POA: Diagnosis not present

## 2017-05-31 DIAGNOSIS — Z1212 Encounter for screening for malignant neoplasm of rectum: Secondary | ICD-10-CM | POA: Diagnosis not present

## 2017-05-31 DIAGNOSIS — Z113 Encounter for screening for infections with a predominantly sexual mode of transmission: Secondary | ICD-10-CM

## 2017-05-31 DIAGNOSIS — Z3041 Encounter for surveillance of contraceptive pills: Secondary | ICD-10-CM

## 2017-05-31 DIAGNOSIS — Z01419 Encounter for gynecological examination (general) (routine) without abnormal findings: Secondary | ICD-10-CM

## 2017-05-31 DIAGNOSIS — I10 Essential (primary) hypertension: Secondary | ICD-10-CM

## 2017-05-31 DIAGNOSIS — Z3009 Encounter for other general counseling and advice on contraception: Secondary | ICD-10-CM

## 2017-05-31 LAB — HEMOCCULT GUIAC POC 1CARD (OFFICE): Fecal Occult Blood, POC: NEGATIVE

## 2017-05-31 MED ORDER — TRIAMCINOLONE ACETONIDE 0.5 % EX OINT: 1.0000 "application " | TOPICAL_OINTMENT | Freq: Two times a day (BID) | CUTANEOUS | 0 refills | Status: DC

## 2017-05-31 MED ORDER — LISINOPRIL-HYDROCHLOROTHIAZIDE 20-12.5 MG PO TABS: 1.0000 | ORAL_TABLET | Freq: Every day | ORAL | 12 refills | Status: DC

## 2017-05-31 MED ORDER — NORGESTIM-ETH ESTRAD TRIPHASIC 0.18/0.215/0.25 MG-35 MCG PO TABS: 1.0000 | ORAL_TABLET | Freq: Every day | ORAL | 12 refills | Status: DC

## 2017-05-31 NOTE — Progress Notes (Signed)
Patient ID: Marie Mills, female   DOB: [birth year], 47 y.o.   MRN: [id number] History of Present Illness:  Marie Mills is a 47 year old white female in for well woman gyn exam,she had normal pap with negative HPV.Has not been on BP meds, were too expensive.  PCP is Dr Telford Nab   Current Medications, Allergies, Past Medical History, Past Surgical History, Family History and Social History were reviewed in [facility] record.     Review of Systems: Patient denies any headaches, hearing loss, fatigue, blurred vision, shortness of breath, chest pain, abdominal pain, problems with bowel movements, urination, or intercourse. No joint pain or mood swings.    Physical Exam:BP (!) 154/88 (BP Location: Left Arm, Patient Position: Sitting, Cuff Size: Large)   Pulse 93   Ht 5\' 6"  (1.676 m)   Wt 256 lb 8 oz (116.3 kg)   LMP 05/24/2017   BMI 41.40 kg/m  General:  Well developed, well nourished, no acute distress Skin:  Warm and dry Neck:  Midline trachea, normal thyroid, good ROM, no lymphadenopathy Lungs; Clear to auscultation bilaterally Breast:  No dominant palpable mass, retraction, or nipple discharge Cardiovascular: Regular rate and rhythm Abdomen:  Soft, non tender, no hepatosplenomegaly Pelvic:  External genitalia is normal in appearance, has sebaceous cyst left inner labia.  The vagina is normal in appearance. Urethra has no lesions or masses. The cervix is bulbous.  Uterus is felt to be normal size, shape, and contour.  No adnexal masses or tenderness noted.Bladder is non tender, no masses felt.GC/CHL obtained.  Rectal: Good sphincter tone, no polyps, or hemorrhoids felt.  Hemoccult negative. Extremities/musculoskeletal:  No swelling or varicosities noted, no clubbing or cyanosis Psych:  No mood changes, alert and cooperative,seems happy PHQ 2 score 0.  Impression: 1. Encounter for well woman exam with routine gynecological exam   2. Family planning   3. Surveillance for  birth control, oral contraceptives   4. Screening examination for STD (sexually transmitted disease)   5. Screening for colorectal cancer   6. Essential hypertension       Plan:  Meds ordered this encounter  Medications  . triamcinolone ointment (KENALOG) 0.5 %    Sig: Apply 1 application topically 2 (two) times daily.    Dispense:  30 g    Refill:  0    Order Specific Question:   Supervising Provider    Answer:   Elonda Husky, LUTHER H [[id number]]  . Norgestimate-Ethinyl Estradiol Triphasic (TRI-SPRINTEC) 0.18/0.215/0.25 MG-35 MCG tablet    Sig: Take 1 tablet by mouth daily.    Dispense:  28 tablet    Refill:  12    Order Specific Question:   Supervising Provider    Answer:   Elonda Husky, LUTHER H [[id number]]  . lisinopril-hydrochlorothiazide (PRINZIDE,ZESTORETIC) 20-12.5 MG tablet    Sig: Take 1 tablet by mouth daily.    Dispense:  30 tablet    Refill:  12    Order Specific Question:   Supervising Provider    Answer:   Florian Buff [[id number]]  Physical in 1 year Pap in 2021 Mammogram advised  GC/CGL sent Check HIV and RPR

## 2017-06-01 LAB — RPR: RPR Ser Ql: NONREACTIVE

## 2017-06-01 LAB — HIV ANTIBODY (ROUTINE TESTING W REFLEX): HIV Screen 4th Generation wRfx: NONREACTIVE

## 2017-06-02 LAB — GC/CHLAMYDIA PROBE AMP
Chlamydia trachomatis, NAA: NEGATIVE
Neisseria gonorrhoeae by PCR: NEGATIVE

## 2018-01-17 DIAGNOSIS — K210 Gastro-esophageal reflux disease with esophagitis: Secondary | ICD-10-CM | POA: Diagnosis not present

## 2018-01-17 DIAGNOSIS — I10 Essential (primary) hypertension: Secondary | ICD-10-CM | POA: Diagnosis not present

## 2018-04-20 ENCOUNTER — Emergency Department (HOSPITAL_COMMUNITY)
Admission: EM | Admit: 2018-04-20 | Discharge: 2018-04-21 | Disposition: A | Discharge status: Home / Self Care | Payer: Medicaid Other | Attending: Emergency Medicine | Admitting: Emergency Medicine

## 2018-04-20 ENCOUNTER — Emergency Department (HOSPITAL_COMMUNITY): Payer: Medicaid Other

## 2018-04-20 ENCOUNTER — Encounter (HOSPITAL_COMMUNITY): Payer: Self-pay | Admitting: Emergency Medicine

## 2018-04-20 ENCOUNTER — Other Ambulatory Visit: Payer: Self-pay

## 2018-04-20 DIAGNOSIS — R002 Palpitations: Secondary | ICD-10-CM | POA: Insufficient documentation

## 2018-04-20 DIAGNOSIS — I10 Essential (primary) hypertension: Secondary | ICD-10-CM | POA: Insufficient documentation

## 2018-04-20 DIAGNOSIS — E876 Hypokalemia: Secondary | ICD-10-CM

## 2018-04-20 DIAGNOSIS — R072 Precordial pain: Secondary | ICD-10-CM | POA: Diagnosis not present

## 2018-04-20 DIAGNOSIS — R079 Chest pain, unspecified: Secondary | ICD-10-CM | POA: Diagnosis not present

## 2018-04-20 LAB — CBC
HCT: 40.3 % (ref 36.0–46.0)
Hemoglobin: 13.1 g/dL (ref 12.0–15.0)
MCH: 29.1 pg (ref 26.0–34.0)
MCHC: 32.5 g/dL (ref 30.0–36.0)
MCV: 89.6 fL (ref 80.0–100.0)
Platelets: 283 10*3/uL (ref 150–400)
RBC: 4.5 MIL/uL (ref 3.87–5.11)
RDW: 13.5 % (ref 11.5–15.5)
WBC: 9.2 10*3/uL (ref 4.0–10.5)
nRBC: 0 % (ref 0.0–0.2)

## 2018-04-20 LAB — D-DIMER, QUANTITATIVE: D-Dimer, Quant: 0.32 ug/mL-FEU (ref 0.00–0.50)

## 2018-04-20 LAB — BASIC METABOLIC PANEL
Anion gap: 10 (ref 5–15)
BUN: 20 mg/dL (ref 6–20)
CO2: 23 mmol/L (ref 22–32)
Calcium: 8.4 mg/dL — ABNORMAL LOW (ref 8.9–10.3)
Chloride: 101 mmol/L (ref 98–111)
Creatinine, Ser: 0.75 mg/dL (ref 0.44–1.00)
GFR calc Af Amer: >60 mL/min (ref >60)
GFR calc non Af Amer: >60 mL/min (ref >60)
Glucose, Bld: 107 mg/dL — ABNORMAL HIGH (ref 70–99)
Potassium: 3 mmol/L — ABNORMAL LOW (ref 3.5–5.1)
Sodium: 134 mmol/L — ABNORMAL LOW (ref 135–145)

## 2018-04-20 LAB — I-STAT BETA HCG BLOOD, ED (MC, WL, AP ONLY): I-stat hCG, quantitative: <5 m[IU]/mL (ref <5)

## 2018-04-20 LAB — I-STAT TROPONIN, ED: Troponin i, poc: 0.01 ng/mL (ref 0.00–0.08)

## 2018-04-20 MED ORDER — POTASSIUM CHLORIDE CRYS ER 20 MEQ PO TBCR: 40.0000 meq | EXTENDED_RELEASE_TABLET | Freq: Every day | ORAL | 0 refills | Status: DC

## 2018-04-20 MED ORDER — SODIUM CHLORIDE 0.9 % IV BOLUS
1000.0000 mL | Freq: Once | INTRAVENOUS | Status: AC
  Administered 2018-04-20: 1000 mL via INTRAVENOUS

## 2018-04-20 MED ORDER — POTASSIUM CHLORIDE CRYS ER 20 MEQ PO TBCR
40.0000 meq | EXTENDED_RELEASE_TABLET | Freq: Once | ORAL | Status: AC
  Administered 2018-04-21: 40 meq via ORAL
  Filled 2018-04-20: qty 2

## 2018-04-20 NOTE — ED Provider Notes (Signed)
Emergency Department Provider Note   I have reviewed the triage vital signs and the nursing notes.   HISTORY  Chief Complaint Chest Pain   HPI Marie Mills is a 48 y.o. female with PMH of HTN presents to the emergency department for evaluation of acute onset heart palpitations with left-sided chest discomfort radiating to the left shoulder.  Symptoms began this evening with no obvious provoking factors.  Patient states she took her blood pressure noticed it was elevated and the blood pressure cuff she was using at home recorded a heart rate in the 140s. Denies presyncope symptoms.  No nausea or vomiting.  No fevers or chills.  She denies any new medications.  She does take oral contraception.  No leg swelling.  No history of DVT/PE.  She does not take medication for hypertension.   Past Medical History:  Diagnosis Date  . BV (bacterial vaginosis) 08/29/2013  . Contraceptive management 03/21/2013  . Hypertension   . Obesity   . Vaginal irritation 08/29/2013    Patient Active Problem List   Diagnosis Date Noted  . 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 present 05/30/2016  . Surveillance for birth control, oral contraceptives 05/30/2016  . Family planning 05/30/2016  . Screening for colorectal cancer 05/30/2016  . Vaginal irritation 08/29/2013  . BV (bacterial vaginosis) 08/29/2013  . Essential hypertension 03/21/2013  . Contraceptive management 03/21/2013    Past Surgical History:  Procedure Laterality Date  . CHOLECYSTECTOMY      Allergies Penicillins; Codeine; and Sulfa antibiotics  Family History  Problem Relation Age of Onset  . Hypertension Mother   . Hyperlipidemia Mother   . Diabetes Mother   . Hyperlipidemia Father   . Hypertension Father   . Diabetes Father   . Seizures Father   . Cancer Maternal Aunt        breast  . Diabetes Sister   . Hypertension Sister   . Diabetes Sister   . Hypertension Sister   . Diabetes Brother   . Hypertension Brother    . Hypertension Sister   . Asthma Son   . Asthma Daughter     Social History Social History   Tobacco Use  . Smoking status: Never Smoker  . Smokeless tobacco: Never Used  Substance Use Topics  . Alcohol use: No  . Drug use: No    Review of Systems  Constitutional: No fever/chills Eyes: No visual changes. ENT: No sore throat. Cardiovascular: Positive chest pain and heart palpitations.  Respiratory: Denies shortness of breath. Gastrointestinal: No abdominal pain.  No nausea, no vomiting.  No diarrhea.  No constipation. Genitourinary: Negative for dysuria. Musculoskeletal: Negative for back pain. Skin: Negative for rash. Neurological: Negative for headaches, focal weakness or numbness.  10-point ROS otherwise negative.  ____________________________________________   PHYSICAL EXAM:  VITAL SIGNS: ED Triage Vitals  Enc Vitals Group     BP 04/20/18 2200 113/69     Pulse Rate 04/20/18 2200 98     Resp 04/20/18 2200 16     Temp 04/20/18 2200 98.1 F (36.7 C)     Temp Source 04/20/18 2200 Oral     SpO2 04/20/18 2200 98 %     Pain Score 04/20/18 2152 5   Constitutional: Alert and oriented. Well appearing and in no acute distress. Eyes: Conjunctivae are normal.  Head: Atraumatic. Nose: No congestion/rhinnorhea. Mouth/Throat: Mucous membranes are moist.  Neck: No stridor. Cardiovascular: Normal rate, regular rhythm. Good peripheral circulation. Grossly normal heart sounds.  Respiratory: Normal respiratory effort.  No retractions. Lungs CTAB. Gastrointestinal: Soft and nontender. No distention.  Musculoskeletal: No lower extremity tenderness nor edema. No gross deformities of extremities. Neurologic:  Normal speech and language. No gross focal neurologic deficits are appreciated.  Skin:  Skin is warm, dry and intact. No rash noted.  ____________________________________________   LABS (all labs ordered are listed, but only abnormal results are displayed)  Labs  Reviewed  BASIC METABOLIC PANEL - Abnormal; Notable for the following components:      Result Value   Sodium 134 (*)    Potassium 3.0 (*)    Glucose, Bld 107 (*)    Calcium 8.4 (*)    All other components within normal limits  CBC  D-DIMER, QUANTITATIVE (NOT AT [facility])  I-STAT TROPONIN, ED  I-STAT BETA HCG BLOOD, ED ([facility], [facility], [facility] ONLY)  I-STAT TROPONIN, ED   ____________________________________________  EKG   EKG Interpretation  Date/Time:  Friday April 20 2018 21:58:40 EST Ventricular Rate:  91 PR Interval:    QRS Duration: 77 QT Interval:  360 QTC Calculation: 443 R Axis:   65 Text Interpretation:  Sinus rhythm Borderline repolarization abnormality No STEMI.  Confirmed by Nanda Quinton [phone]) on 04/20/2018 10:00:24 PM       ____________________________________________  RADIOLOGY  Dg Chest 2 View  Result Date: 04/20/2018 CLINICAL DATA:  Chest pain. EXAM: CHEST - 2 VIEW COMPARISON:  Radiograph 06/11/2015 FINDINGS: The cardiomediastinal contours are normal. The lungs are clear. Pulmonary vasculature is normal. No consolidation, pleural effusion, or pneumothorax. No acute osseous abnormalities are seen. IMPRESSION: No acute chest findings. Electronically Signed   By: Keith Rake M.D.   On: 04/20/2018 23:18    ____________________________________________   PROCEDURES  Procedure(s) performed:   Procedures  None ____________________________________________   INITIAL IMPRESSION / ASSESSMENT AND PLAN / ED COURSE  Pertinent labs & imaging results that were available during my care of the patient were reviewed by me and considered in my medical decision making (see chart for details).  Patient presents to the emergency department for evaluation of left-sided chest discomfort, palpitations, elevated blood pressure.  Patient describes her pain is more of a "funny feeling" but does report that it radiates to the left shoulder.  She has a mildly elevated heart rate here  and is on oral contraception.  Overall, my suspicion for thromboembolic disease is low however I do plan to obtain a d-dimer along with troponin.  EKG shows a sinus rhythm with no ischemic changes.  No hypoxemia here.  11:15 PM Lab work, chest x-ray reviewed.  D-dimer negative.  Troponin is normal.  Patient does have hypokalemia with potassium of 3.0.  Plan for oral supplementation at home.  No arrhythmias here in the emergency department while on telemetry.  With symptoms this evening and pain radiating to the left shoulder plan for serial troponin.  If negative, plan for cardiology follow-up with heart palpitations and consideration of ambulatory monitoring.   Care transferred to Dr. Wyvonnia Dusky pending repeat troponin.  ____________________________________________  FINAL CLINICAL IMPRESSION(S) / ED DIAGNOSES  Final diagnoses:  Precordial chest pain  Palpitations  Hypokalemia     MEDICATIONS GIVEN DURING THIS VISIT:  Medications  sodium chloride 0.9 % bolus 1,000 mL (0 mLs Intravenous Stopped 04/21/18 0003)  potassium chloride SA (K-DUR,KLOR-CON) CR tablet 40 mEq (40 mEq Oral Given 04/21/18 0150)     NEW OUTPATIENT MEDICATIONS STARTED DURING THIS VISIT:  Discharge Medication List as of 04/21/2018  1:44 AM  START taking these medications   Details  potassium chloride SA (K-DUR,KLOR-CON) 20 MEQ tablet Take 2 tablets (40 mEq total) by mouth daily for 5 days., Starting Fri 04/20/2018, Until Wed 04/25/2018, Normal        Note:  This document was prepared using Dragon voice recognition software and may include unintentional dictation errors.  Nanda Quinton, MD Emergency Medicine    [name], [facility], MD 04/21/18 [phone]

## 2018-04-20 NOTE — ED Triage Notes (Signed)
Per Pt BP reading at home 129/104. Pt states her HR was high and she had a "funny feeling" in her chest.

## 2018-04-20 NOTE — Discharge Instructions (Addendum)

## 2018-04-21 LAB — I-STAT TROPONIN, ED: Troponin i, poc: 0 ng/mL (ref 0.00–0.08)

## 2018-04-21 NOTE — ED Provider Notes (Signed)
Care assumed from Dr. Laverta Baltimore.  Patient presented after palpitations, elevated heart rate and discomfort in her chest.  Second troponin pending.  Troponin negative.  D-dimer negative.  Patient reports resolution of discomfort in her chest.  Discussed follow-up with PCP and cardiology for stress test.  Discussed that her risk for ACS is low but not 0.  Potassium was repleted. Discussed return to the ED if chest pain becomes exertional, associated shortness of breath, nausea, vomiting, diaphoresis or other concerns.  BP (!) 124/56   Pulse 88   Temp 98.1 F (36.7 C) (Oral)   Resp 14   LMP 04/07/2018   SpO2 98%     Ezequiel Essex, MD 04/21/18 0144

## 2018-04-21 NOTE — ED Notes (Signed)
Pt ambulatory to waiting room. Pt verbalized understanding of discharge instructions.   

## 2018-05-01 DIAGNOSIS — I10 Essential (primary) hypertension: Secondary | ICD-10-CM | POA: Diagnosis not present

## 2018-05-01 DIAGNOSIS — Z131 Encounter for screening for diabetes mellitus: Secondary | ICD-10-CM | POA: Diagnosis not present

## 2018-05-01 DIAGNOSIS — E876 Hypokalemia: Secondary | ICD-10-CM | POA: Diagnosis not present

## 2018-05-01 DIAGNOSIS — R079 Chest pain, unspecified: Secondary | ICD-10-CM | POA: Diagnosis not present

## 2018-05-01 DIAGNOSIS — K210 Gastro-esophageal reflux disease with esophagitis: Secondary | ICD-10-CM | POA: Diagnosis not present

## 2018-05-17 ENCOUNTER — Ambulatory Visit: Payer: Medicaid Other | Admitting: Cardiology

## 2018-05-18 ENCOUNTER — Encounter: Payer: Self-pay | Admitting: Cardiology

## 2018-07-25 DIAGNOSIS — N200 Calculus of kidney: Secondary | ICD-10-CM | POA: Diagnosis not present

## 2018-08-01 ENCOUNTER — Telehealth: Payer: Self-pay | Admitting: Adult Health

## 2018-08-01 NOTE — Telephone Encounter (Signed)
Pt wanting to see if she can have her Norgestimate-Ethinyl Estradiol Triphasic (TRI-SPRINTEC) 0.18/0.215/0.25 MG-35 MCG tablet  Refilled to [facility] on [address].

## 2018-08-02 MED ORDER — NORGESTIM-ETH ESTRAD TRIPHASIC 0.18/0.215/0.25 MG-35 MCG PO TABS: 1.0000 | ORAL_TABLET | Freq: Every day | ORAL | 4 refills | Status: DC

## 2018-08-02 NOTE — Addendum Note (Signed)
Addended by: Derrek Monaco A on: 08/02/2018 08:26 AM   Modules accepted: Orders

## 2018-08-02 NOTE — Telephone Encounter (Signed)
Left message that I refilled OC, and will see you in June and stay safe

## 2018-10-04 ENCOUNTER — Telehealth: Payer: Self-pay | Admitting: Adult Health

## 2018-10-04 NOTE — Telephone Encounter (Signed)

## 2018-10-08 ENCOUNTER — Other Ambulatory Visit: Payer: Self-pay

## 2018-10-08 ENCOUNTER — Ambulatory Visit (INDEPENDENT_AMBULATORY_CARE_PROVIDER_SITE_OTHER): Payer: Medicaid Other | Admitting: Adult Health

## 2018-10-08 ENCOUNTER — Encounter: Payer: Self-pay | Admitting: Adult Health

## 2018-10-08 VITALS — BP 147/82 | HR 93 | Ht 65.0 in | Wt 279.0 lb

## 2018-10-08 DIAGNOSIS — Z1211 Encounter for screening for malignant neoplasm of colon: Secondary | ICD-10-CM | POA: Diagnosis not present

## 2018-10-08 DIAGNOSIS — Z6841 Body Mass Index (BMI) 40.0 and over, adult: Secondary | ICD-10-CM | POA: Insufficient documentation

## 2018-10-08 DIAGNOSIS — Z0000 Encounter for general adult medical examination without abnormal findings: Secondary | ICD-10-CM | POA: Diagnosis not present

## 2018-10-08 DIAGNOSIS — Z3041 Encounter for surveillance of contraceptive pills: Secondary | ICD-10-CM

## 2018-10-08 DIAGNOSIS — Z01419 Encounter for gynecological examination (general) (routine) without abnormal findings: Secondary | ICD-10-CM | POA: Diagnosis not present

## 2018-10-08 DIAGNOSIS — Z1212 Encounter for screening for malignant neoplasm of rectum: Secondary | ICD-10-CM

## 2018-10-08 DIAGNOSIS — Z713 Dietary counseling and surveillance: Secondary | ICD-10-CM | POA: Insufficient documentation

## 2018-10-08 LAB — HEMOCCULT GUIAC POC 1CARD (OFFICE): Fecal Occult Blood, POC: NEGATIVE

## 2018-10-08 MED ORDER — PHENTERMINE HCL 37.5 MG PO TABS: 37.5000 mg | ORAL_TABLET | Freq: Every day | ORAL | 0 refills | Status: DC

## 2018-10-08 MED ORDER — NORGESTIM-ETH ESTRAD TRIPHASIC 0.18/0.215/0.25 MG-35 MCG PO TABS: 1.0000 | ORAL_TABLET | Freq: Every day | ORAL | 4 refills | Status: DC

## 2018-10-08 NOTE — Progress Notes (Signed)
Patient ID: Anaysia Germer, female   DOB: Apr 16, 1971, 48 y.o.   MRN: [id number] History of Present Illness: Kahmya is a 48 year old white female, divorced, in long term relationship, [id number], in for a well woman gyn exam, she had a normal pap with negative HPV 05/30/26. PCP is Dr Telford Nab in [place].   Current Medications, Allergies, Past Medical History, Past Surgical History, Family History and Social History were reviewed in [facility] record.     Review of Systems: Patient denies any headaches, hearing loss, fatigue, blurred vision, shortness of breath, chest pain, abdominal pain, problems with bowel movements, urination, or intercourse. No joint pain or mood swings. She is complaining of weight gain and just can't lose, and is less active, she works nights at [employer].  She is happy with her OCs.  Physical Exam: BP (!) 147/82 (BP Location: Left Arm, Patient Position: Sitting, Cuff Size: Large)   Pulse 93   Ht 5\' 5"  (1.651 m)   Wt 279 lb (126.6 kg)   LMP 09/09/2018   BMI 46.43 kg/m  General:  Well developed, well nourished, no acute distress Skin:  Warm and dry Neck:  Midline trachea, normal thyroid, good ROM, no lymphadenopathy Lungs; Clear to auscultation bilaterally Breast:  No dominant palpable mass, retraction, or nipple discharge Cardiovascular: Regular rate and rhythm Abdomen:  Soft, non tender, no hepatosplenomegaly Pelvic:  External genitalia is normal in appearance, no lesions.  The vagina is normal in appearance. Urethra has no lesions or masses. The cervix is bulbous.  Uterus is felt to be normal size, shape, and contour.  No adnexal masses or tenderness noted.Bladder is non tender, no masses felt. Rectal: Good sphincter tone, no polyps, or hemorrhoids felt.  Hemoccult negative. Extremities/musculoskeletal:  No swelling or varicosities noted, no clubbing or cyanosis Psych:  No mood changes, alert and cooperative,seems happy Fall risk is  low. PHQ 2 score 0.  Examination chaperoned by Levy Pupa LPN.   Impression: 1. Encounter for well woman exam with routine gynecological exam   2. Screening for colorectal cancer   3. Encounter for surveillance of contraceptive pills   4. Weight loss counseling, encounter for   5. Body mass index 45.0-49.9, adult ([facility])       Plan: Increase water and decrease portions Get mammogram now Pap and physical in 1 year Follow up in 4 weeks for weight and BP check, after starting adipex  Meds ordered this encounter  Medications  . Norgestimate-Ethinyl Estradiol Triphasic (TRI-SPRINTEC) 0.18/0.215/0.25 MG-35 MCG tablet    Sig: Take 1 tablet by mouth daily.    Dispense:  84 tablet    Refill:  4    Order Specific Question:   Supervising Provider    Answer:   Elonda Husky, LUTHER H [[id number]]  . phentermine (ADIPEX-P) 37.5 MG tablet    Sig: Take 1 tablet (37.5 mg total) by mouth daily before breakfast.    Dispense:  30 tablet    Refill:  0    Order Specific Question:   Supervising Provider    Answer:   Tania Ade H [[id number]]

## 2018-10-25 ENCOUNTER — Telehealth: Payer: Self-pay | Admitting: Adult Health

## 2018-10-25 MED ORDER — DOXYCYCLINE HYCLATE 100 MG PO TABS: ORAL_TABLET | ORAL | 1 refills | Status: DC

## 2018-10-25 NOTE — Telephone Encounter (Signed)
Will rx doxycycline  

## 2018-10-25 NOTE — Telephone Encounter (Signed)
Pt requesting medication for boils to be sent in for her. She states that she had this issue a few months ago and Anderson Malta had sent a medication in for her. Unsure of the name of medication.

## 2018-10-25 NOTE — Addendum Note (Signed)
Addended by: Derrek Monaco A on: 10/25/2018 05:01 PM   Modules accepted: Orders

## 2018-10-31 ENCOUNTER — Telehealth: Payer: Self-pay | Admitting: Adult Health

## 2018-10-31 NOTE — Telephone Encounter (Signed)

## 2018-11-01 ENCOUNTER — Other Ambulatory Visit: Payer: Self-pay

## 2018-11-01 ENCOUNTER — Ambulatory Visit (INDEPENDENT_AMBULATORY_CARE_PROVIDER_SITE_OTHER): Payer: Medicaid Other | Admitting: Adult Health

## 2018-11-01 ENCOUNTER — Encounter: Payer: Self-pay | Admitting: Adult Health

## 2018-11-01 VITALS — BP 144/84 | HR 87 | Ht 66.0 in | Wt 265.8 lb

## 2018-11-01 DIAGNOSIS — Z6841 Body Mass Index (BMI) 40.0 and over, adult: Secondary | ICD-10-CM

## 2018-11-01 DIAGNOSIS — Z713 Dietary counseling and surveillance: Secondary | ICD-10-CM | POA: Diagnosis not present

## 2018-11-01 MED ORDER — PHENTERMINE HCL 37.5 MG PO TABS: 37.5000 mg | ORAL_TABLET | Freq: Every day | ORAL | 0 refills | Status: DC

## 2018-11-01 NOTE — Progress Notes (Signed)
Patient ID: Marie Mills, female   DOB: 19-Nov-1970, 48 y.o.   MRN: [id number] History of Present Illness: Sherene is a 48 year old white female in for a weight and BP check has been on adipex about a month and has lost weight and feels good.    Current Medications, Allergies, Past Medical History, Past Surgical History, Family History and Social History were reviewed in [employer] record.     Review of Systems: Feels good, no headaches or trouble sleeping.    Physical Exam:BP (!) 144/84 (BP Location: Left Arm, Patient Position: Sitting, Cuff Size: Normal)   Pulse 87   Ht 5\' 6"  (1.676 m)   Wt 265 lb 12.8 oz (120.6 kg)   LMP 10/04/2018   BMI 42.90 kg/m  General:  Well developed, well nourished, no acute distress Skin:  Warm and dry Lungs; Clear to auscultation bilaterally Cardiovascular: Regular rate and rhythm Psych:  No mood changes, alert and cooperative,seems happy Fall risk is low.PHQ 2 score 0. She lost 13 lbs and feels good.  Impression: 1. Weight loss counseling, encounter for   2. Body mass index 40.0-44.9, adult ([place])       Plan: Will continue adipex  Meds ordered this encounter  Medications  . phentermine (ADIPEX-P) 37.5 MG tablet    Sig: Take 1 tablet (37.5 mg total) by mouth daily before breakfast.    Dispense:  30 tablet    Refill:  0    Order Specific Question:   Supervising Provider    Answer:   Tania Ade H [[id number]]  Follow up in 4 weeks

## 2018-12-03 ENCOUNTER — Ambulatory Visit: Payer: Medicaid Other | Admitting: Adult Health

## 2018-12-03 ENCOUNTER — Telehealth: Payer: Self-pay | Admitting: Obstetrics and Gynecology

## 2018-12-03 NOTE — Telephone Encounter (Signed)

## 2018-12-04 ENCOUNTER — Ambulatory Visit: Payer: Medicaid Other | Admitting: Obstetrics and Gynecology

## 2018-12-04 ENCOUNTER — Encounter: Payer: Self-pay | Admitting: Obstetrics and Gynecology

## 2018-12-04 ENCOUNTER — Other Ambulatory Visit: Payer: Self-pay

## 2018-12-04 VITALS — BP 148/90 | HR 99 | Ht 66.0 in | Wt 250.6 lb

## 2018-12-04 DIAGNOSIS — Z6841 Body Mass Index (BMI) 40.0 and over, adult: Secondary | ICD-10-CM | POA: Diagnosis not present

## 2018-12-04 DIAGNOSIS — Z713 Dietary counseling and surveillance: Secondary | ICD-10-CM | POA: Diagnosis not present

## 2018-12-04 MED ORDER — PHENTERMINE HCL 37.5 MG PO TABS: 37.5000 mg | ORAL_TABLET | Freq: Every day | ORAL | 1 refills | Status: DC

## 2018-12-04 NOTE — Progress Notes (Signed)
   [facility] Visit  @DATE @            Patient name: Marie Mills MRN [id number]  Date of birth: 1970-09-10  CC & HPI:  Sherika Kubicki is a 48 y.o. female presenting today for f/u of phentermine.  Which very much helps her motivation. Pt has lost weight nicely. Current weight 250 , down from  280- 265 over last 2 months. Pt eating different, lots of salads.fruits, quit fruits and breads. Walks 1 mile/ day. ROS:  ROS   Pertinent History Reviewed:   Reviewed: Significant for htn managed by Dr [facility]         Past Medical History:  Diagnosis Date  . BV (bacterial vaginosis) 08/29/2013  . Contraceptive management 03/21/2013  . Hypertension   . Obesity   . Vaginal irritation 08/29/2013                              Surgical Hx:    Past Surgical History:  Procedure Laterality Date  . CHOLECYSTECTOMY     Medications: Reviewed & Updated - see associated section                       Current Outpatient Medications:  .  doxycycline (VIBRA-TABS) 100 MG tablet, Take 1 bid x 10 days, Disp: 20 tablet, Rfl: 1 .  lisinopril-hydrochlorothiazide (PRINZIDE,ZESTORETIC) 20-12.5 MG tablet, Take 1 tablet by mouth daily., Disp: 30 tablet, Rfl: 12 .  Norgestimate-Ethinyl Estradiol Triphasic (TRI-SPRINTEC) 0.18/0.215/0.25 MG-35 MCG tablet, Take 1 tablet by mouth daily., Disp: 84 tablet, Rfl: 4 .  omeprazole (PRILOSEC) 40 MG capsule, TK 1 C PO QD, Disp: , Rfl:  .  phentermine (ADIPEX-P) 37.5 MG tablet, Take 1 tablet (37.5 mg total) by mouth daily before breakfast., Disp: 30 tablet, Rfl: 0   Social History: Reviewed -  reports that she has never smoked. She has never used smokeless tobacco.  Objective Findings:  Vitals: Blood pressure (!) 148/90, pulse 99, height 5\' 6"  (1.676 m), weight 250 lb 9.6 oz (113.7 kg), last menstrual period 11/03/2018. Pt reports she is taking her meds. Bp managed by Dr Jaquita Folds. PHYSICAL EXAMINATION General appearance - alert, well appearing, and in no  distress, oriented to person, place, and time and overweight Mental status - alert, oriented to person, place, and time Chest -  Heart -  Abdomen - soft, nontender, nondistended, no masses or organomegaly Breasts -  Skin - normal coloration and turgor, no rashes, no suspicious skin lesions noted  PELVIC   Assessment & Plan:   A:  1.  successful weight loss  P:  1.  refil phentermine 37/5 x 2 refil. 2.  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

## 2018-12-07 ENCOUNTER — Other Ambulatory Visit: Payer: Self-pay | Admitting: Women's Health

## 2018-12-07 ENCOUNTER — Telehealth: Payer: Self-pay | Admitting: *Deleted

## 2018-12-07 ENCOUNTER — Telehealth: Payer: Self-pay | Admitting: Obstetrics and Gynecology

## 2018-12-07 MED ORDER — FLUCONAZOLE 150 MG PO TABS: 150.0000 mg | ORAL_TABLET | Freq: Once | ORAL | 0 refills | Status: AC

## 2018-12-07 NOTE — Telephone Encounter (Signed)
Pt states she is not able to get off work for an appt or self swab and would like to see if medication can be sent in. Pt states that she has tried [employer].

## 2018-12-07 NOTE — Telephone Encounter (Signed)
LMOVM that we do not have any upcoming appointments.  Pt can try Monistat or generic over the counter to see if that will treat her symptoms or she can come in for a self swab.  Advised to call our office to let us know.

## 2018-12-07 NOTE — Telephone Encounter (Signed)
Patient called stated that she was last seen on 12/04/18.  Stated she thinks she has a yeast infection, she has burning and itching.  [employer] [address]  [phone]

## 2018-12-31 DIAGNOSIS — H5203 Hypermetropia, bilateral: Secondary | ICD-10-CM | POA: Diagnosis not present

## 2018-12-31 DIAGNOSIS — H524 Presbyopia: Secondary | ICD-10-CM | POA: Diagnosis not present

## 2019-01-08 DIAGNOSIS — H5213 Myopia, bilateral: Secondary | ICD-10-CM | POA: Diagnosis not present

## 2019-01-18 DIAGNOSIS — H52221 Regular astigmatism, right eye: Secondary | ICD-10-CM | POA: Diagnosis not present

## 2019-01-18 DIAGNOSIS — H524 Presbyopia: Secondary | ICD-10-CM | POA: Diagnosis not present

## 2019-02-01 ENCOUNTER — Telehealth: Payer: Self-pay | Admitting: Adult Health

## 2019-02-01 NOTE — Telephone Encounter (Signed)

## 2019-02-04 ENCOUNTER — Other Ambulatory Visit: Payer: Self-pay

## 2019-02-04 ENCOUNTER — Ambulatory Visit (INDEPENDENT_AMBULATORY_CARE_PROVIDER_SITE_OTHER): Payer: Medicaid Other | Admitting: Adult Health

## 2019-02-04 ENCOUNTER — Encounter: Payer: Self-pay | Admitting: Adult Health

## 2019-02-04 VITALS — BP 152/80 | HR 90 | Ht 66.0 in | Wt 237.0 lb

## 2019-02-04 DIAGNOSIS — Z713 Dietary counseling and surveillance: Secondary | ICD-10-CM | POA: Diagnosis not present

## 2019-02-04 DIAGNOSIS — Z6838 Body mass index (BMI) 38.0-38.9, adult: Secondary | ICD-10-CM | POA: Diagnosis not present

## 2019-02-04 MED ORDER — LISINOPRIL-HYDROCHLOROTHIAZIDE 20-12.5 MG PO TABS: 1.0000 | ORAL_TABLET | Freq: Every day | ORAL | 3 refills | Status: DC

## 2019-02-04 MED ORDER — PHENTERMINE HCL 37.5 MG PO TABS: 37.5000 mg | ORAL_TABLET | Freq: Every day | ORAL | 0 refills | Status: DC

## 2019-02-04 NOTE — Progress Notes (Signed)
  Subjective:     Patient ID: Marie Mills, female   DOB: 03/25/1971, 48 y.o.   MRN: [id number]  HPI Marie Mills is a 48 year old white female in for weight check, has been on adipex since July and has lost 28 lbs, she would like to get to 200 lbs.She says she feels better and is moving about more.   Review of Systems +weight loss with adipex  Reviewed past medical,surgical, social and family history. Reviewed medications and allergies.     Objective:   Physical Exam BP (!) 152/80 (BP Location: Left Arm, Patient Position: Sitting, Cuff Size: Normal)   Pulse 90   Ht 5\' 6"  (1.676 m)   Wt 237 lb (107.5 kg)   BMI 38.25 kg/m   Skin warm and dry. Lungs: clear to ausculation bilaterally. Cardiovascular: regular rate and rhythm.   She is out of BP meds, and can't get in touch with Dr Manuella Ghazi, will refill. Assessment:     1. Weight loss counseling, encounter for   2. Body mass index 38.0-38.9, adult       Plan:     Meds ordered this encounter  Medications  . phentermine (ADIPEX-P) 37.5 MG tablet    Sig: Take 1 tablet (37.5 mg total) by mouth daily before breakfast.    Dispense:  30 tablet    Refill:  0    Order Specific Question:   Supervising Provider    Answer:   Elonda Husky, LUTHER H [[id number]]  . lisinopril-hydrochlorothiazide (ZESTORETIC) 20-12.5 MG tablet    Sig: Take 1 tablet by mouth daily.    Dispense:  30 tablet    Refill:  3    Order Specific Question:   Supervising Provider    Answer:   Tania Ade H [[id number]]  Follow up in 4 weeks

## 2019-03-01 ENCOUNTER — Telehealth: Payer: Self-pay | Admitting: Adult Health

## 2019-03-01 NOTE — Telephone Encounter (Signed)

## 2019-03-04 ENCOUNTER — Encounter: Payer: Self-pay | Admitting: Adult Health

## 2019-03-04 ENCOUNTER — Other Ambulatory Visit: Payer: Self-pay

## 2019-03-04 ENCOUNTER — Ambulatory Visit (INDEPENDENT_AMBULATORY_CARE_PROVIDER_SITE_OTHER): Payer: Medicaid Other | Admitting: Adult Health

## 2019-03-04 VITALS — BP 129/78 | HR 90 | Ht 65.25 in | Wt 236.0 lb

## 2019-03-04 DIAGNOSIS — Z713 Dietary counseling and surveillance: Secondary | ICD-10-CM | POA: Diagnosis not present

## 2019-03-04 DIAGNOSIS — Z6838 Body mass index (BMI) 38.0-38.9, adult: Secondary | ICD-10-CM | POA: Diagnosis not present

## 2019-03-04 MED ORDER — PHENTERMINE HCL 37.5 MG PO TABS: 37.5000 mg | ORAL_TABLET | Freq: Every day | ORAL | 0 refills | Status: DC

## 2019-03-04 MED ORDER — OMEPRAZOLE 40 MG PO CPDR: DELAYED_RELEASE_CAPSULE | ORAL | 6 refills | Status: AC

## 2019-03-04 NOTE — Progress Notes (Signed)
  Subjective:     Patient ID: Marie Mills, female   DOB: [birth year], 48 y.o.   MRN: [id number]  HPI Marie Mills is a 48 year old white female, single, [id number] in for a weight and BP check.   Review of Systems +weight loss Right knee swelling Reviewed past medical,surgical, social and family history. Reviewed medications and allergies.     Objective:   Physical Exam BP 129/78 (BP Location: Left Arm, Patient Position: Sitting, Cuff Size: Large)   Pulse 90   Ht 5' 5.25" (1.657 m)   Wt 236 lb (107 kg)   LMP 03/01/2019   BMI 38.97 kg/m   Skin warm and dry. Lungs: clear to ausculation bilaterally. Cardiovascular: regular rate and rhythm. Fall risk is low. Has lost 43 lbs total.will continue one more month of adipex. Requests refill on Prilosec Swelling right knee, no pain.    Assessment:     1. Weight loss counseling, encounter for   2. Body mass index 38.0-38.9, adult       Plan:     Meds ordered this encounter  Medications  . phentermine (ADIPEX-P) 37.5 MG tablet    Sig: Take 1 tablet (37.5 mg total) by mouth daily before breakfast.    Dispense:  30 tablet    Refill:  0    Order Specific Question:   Supervising Provider    Answer:   Elonda Husky, LUTHER H [[id number]]  . omeprazole (PRILOSEC) 40 MG capsule    Sig: TK 1 C PO QD    Dispense:  30 capsule    Refill:  6    Order Specific Question:   Supervising Provider    Answer:   Florian Buff [[id number]]  Number given for Dr Aline Brochure and Luna Glasgow to call about knee

## 2019-03-25 DIAGNOSIS — U071 COVID-19: Secondary | ICD-10-CM | POA: Diagnosis not present

## 2019-03-25 DIAGNOSIS — Z20828 Contact with and (suspected) exposure to other viral communicable diseases: Secondary | ICD-10-CM | POA: Diagnosis not present

## 2019-03-26 DIAGNOSIS — Z20828 Contact with and (suspected) exposure to other viral communicable diseases: Secondary | ICD-10-CM | POA: Diagnosis not present

## 2019-03-26 DIAGNOSIS — U071 COVID-19: Secondary | ICD-10-CM | POA: Diagnosis not present

## 2019-04-01 DIAGNOSIS — U071 COVID-19: Secondary | ICD-10-CM | POA: Diagnosis not present

## 2019-04-01 DIAGNOSIS — Z20828 Contact with and (suspected) exposure to other viral communicable diseases: Secondary | ICD-10-CM | POA: Diagnosis not present

## 2019-04-03 DIAGNOSIS — Z20828 Contact with and (suspected) exposure to other viral communicable diseases: Secondary | ICD-10-CM | POA: Diagnosis not present

## 2019-04-03 DIAGNOSIS — U071 COVID-19: Secondary | ICD-10-CM | POA: Diagnosis not present

## 2019-04-04 ENCOUNTER — Telehealth: Payer: Self-pay | Admitting: Adult Health

## 2019-04-04 NOTE — Telephone Encounter (Signed)

## 2019-04-05 ENCOUNTER — Ambulatory Visit: Payer: Medicaid Other | Admitting: Adult Health

## 2019-04-05 ENCOUNTER — Telehealth: Payer: Self-pay | Admitting: Adult Health

## 2019-04-05 NOTE — Telephone Encounter (Signed)
Called patient regarding appointment and the following message was left:   We have you scheduled for an upcoming appointment at our office. At this time, we are still not allowing visitors or children during the appointment, however, a support person, over age 46, may accompany you to your appointment if assistance is needed for safety or care concerns. Otherwise, support persons should remain outside until the visit is complete.   We ask if you have had any exposure to anyone suspected or confirmed of having COVID-19, are awaiting test results for COVID-19 or if you are experiencing any of the following, to call and reschedule your appointment: fever, cough, shortness of breath, muscle pain, diarrhea, rash, vomiting, abdominal pain, red eye, weakness, bruising, bleeding, joint pain, or a severe headache.   Please know we will ask you these questions or similar questions when you arrive for your appointment and again it's how we are keeping everyone safe.    Also,to keep you safe, please use the provided hand sanitizer when you enter the office. We are asking everyone in the office to wear a mask to help prevent the spread of germs. If you have a mask of your own, please wear it to your appointment, if not, we are happy to provide one for you.  Thank you for understanding and your cooperation.    [facility] Staff      

## 2019-04-08 ENCOUNTER — Encounter: Payer: Self-pay | Admitting: Adult Health

## 2019-04-08 ENCOUNTER — Other Ambulatory Visit: Payer: Self-pay

## 2019-04-08 ENCOUNTER — Ambulatory Visit (INDEPENDENT_AMBULATORY_CARE_PROVIDER_SITE_OTHER): Payer: Medicaid Other | Admitting: Adult Health

## 2019-04-08 VITALS — BP 141/88 | HR 90 | Ht 66.0 in | Wt 230.0 lb

## 2019-04-08 DIAGNOSIS — Z713 Dietary counseling and surveillance: Secondary | ICD-10-CM | POA: Diagnosis not present

## 2019-04-08 DIAGNOSIS — U071 COVID-19: Secondary | ICD-10-CM | POA: Diagnosis not present

## 2019-04-08 DIAGNOSIS — Z6837 Body mass index (BMI) 37.0-37.9, adult: Secondary | ICD-10-CM | POA: Diagnosis not present

## 2019-04-08 DIAGNOSIS — Z20828 Contact with and (suspected) exposure to other viral communicable diseases: Secondary | ICD-10-CM | POA: Diagnosis not present

## 2019-04-08 MED ORDER — PHENTERMINE HCL 37.5 MG PO TABS: 37.5000 mg | ORAL_TABLET | Freq: Every day | ORAL | 0 refills | Status: DC

## 2019-04-08 NOTE — Progress Notes (Signed)
Patient ID: Marie Mills, female   DOB: 1970-09-23, 48 y.o.   MRN: [id number]   TELEHEALTH VIRTUAL GYNECOLOGY VISIT ENCOUNTER NOTE  I connected with [name] on 04/08/19 at  2:10 PM EST by telephone at home and verified that I am speaking with the correct person using two identifiers.   I discussed the limitations, risks, security and privacy concerns of performing an evaluation and management service by telephone and the availability of in person appointments. I also discussed with the patient that there may be a patient responsible charge related to this service. The patient expressed understanding and agreed to proceed.   History:  Naidely Buechel is a 48 y.o. [phone] female being evaluated today for weight and BP check. She denies any headache, pain or trouble sleeping  or other concerns.       Past Medical History:  Diagnosis Date  . BV (bacterial vaginosis) 08/29/2013  . Contraceptive management 03/21/2013  . Hypertension   . Obesity   . Vaginal irritation 08/29/2013   Past Surgical History:  Procedure Laterality Date  . CHOLECYSTECTOMY     The following portions of the patient's history were reviewed and updated as appropriate: allergies, current medications, past family history, past medical history, past social history, past surgical history and problem list.   Health Maintenance:  Normal pap and negative HRHPV on 04/29/16  Review of Systems:  Pertinent items noted in HPI and remainder of comprehensive ROS otherwise negative.  Physical Exam:   General:  Alert, oriented and cooperative.   Mental Status: Normal mood and affect perceived. Normal judgment and thought content.  Physical exam deferred due to nature of the encounter BP (!) 141/88   Pulse 90   Ht 5\' 6"  (1.676 m)   Wt 230 lb (104.3 kg)   BMI 37.12 kg/m has lost 6 more lbs total of 49 lbs in last  6 months   Labs and Imaging No results found for this or any previous visit (from the past 336 hour(s)). No  results found.    Assessment and Plan:     1. Weight loss counseling, encounter for Will rx adipex one more time then take a break Follow up prn  2. Body mass index 37.0-37.9, adult Meds ordered this encounter  Medications  . phentermine (ADIPEX-P) 37.5 MG tablet    Sig: Take 1 tablet (37.5 mg total) by mouth daily before breakfast.    Dispense:  30 tablet    Refill:  0    Order Specific Question:   Supervising Provider    Answer:   Florian Buff [[id number]]         I discussed the assessment and treatment plan with the patient. The patient was provided an opportunity to ask questions and all were answered. The patient agreed with the plan and demonstrated an understanding of the instructions.   The patient was advised to call back or seek an in-person evaluation/go to the ED if the symptoms worsen or if the condition fails to improve as anticipated.  I provided 5 minutes of non-face-to-face time during this encounter.   Derrek Monaco, NP [facility], [place]

## 2019-04-22 DIAGNOSIS — Z20828 Contact with and (suspected) exposure to other viral communicable diseases: Secondary | ICD-10-CM | POA: Diagnosis not present

## 2019-04-22 DIAGNOSIS — U071 COVID-19: Secondary | ICD-10-CM | POA: Diagnosis not present

## 2019-04-23 DIAGNOSIS — I10 Essential (primary) hypertension: Secondary | ICD-10-CM | POA: Diagnosis not present

## 2019-04-23 DIAGNOSIS — J029 Acute pharyngitis, unspecified: Secondary | ICD-10-CM | POA: Diagnosis not present

## 2019-04-23 DIAGNOSIS — K219 Gastro-esophageal reflux disease without esophagitis: Secondary | ICD-10-CM | POA: Diagnosis not present

## 2019-05-06 DIAGNOSIS — U071 COVID-19: Secondary | ICD-10-CM | POA: Diagnosis not present

## 2019-05-06 DIAGNOSIS — Z20828 Contact with and (suspected) exposure to other viral communicable diseases: Secondary | ICD-10-CM | POA: Diagnosis not present

## 2019-05-13 DIAGNOSIS — Z20828 Contact with and (suspected) exposure to other viral communicable diseases: Secondary | ICD-10-CM | POA: Diagnosis not present

## 2019-05-13 DIAGNOSIS — U071 COVID-19: Secondary | ICD-10-CM | POA: Diagnosis not present

## 2019-05-27 ENCOUNTER — Telehealth: Payer: Self-pay | Admitting: Adult Health

## 2019-05-27 DIAGNOSIS — Z20828 Contact with and (suspected) exposure to other viral communicable diseases: Secondary | ICD-10-CM | POA: Diagnosis not present

## 2019-05-27 DIAGNOSIS — U071 COVID-19: Secondary | ICD-10-CM | POA: Diagnosis not present

## 2019-05-27 NOTE — Telephone Encounter (Signed)

## 2019-05-28 ENCOUNTER — Ambulatory Visit (INDEPENDENT_AMBULATORY_CARE_PROVIDER_SITE_OTHER): Payer: Medicaid Other | Admitting: Adult Health

## 2019-05-28 ENCOUNTER — Encounter: Payer: Self-pay | Admitting: Adult Health

## 2019-05-28 ENCOUNTER — Other Ambulatory Visit: Payer: Self-pay

## 2019-05-28 VITALS — BP 133/79 | HR 94 | Ht 66.0 in | Wt 245.4 lb

## 2019-05-28 DIAGNOSIS — Z6839 Body mass index (BMI) 39.0-39.9, adult: Secondary | ICD-10-CM | POA: Diagnosis not present

## 2019-05-28 DIAGNOSIS — Z713 Dietary counseling and surveillance: Secondary | ICD-10-CM | POA: Diagnosis not present

## 2019-05-28 MED ORDER — PHENTERMINE HCL 37.5 MG PO TABS: 37.5000 mg | ORAL_TABLET | Freq: Every day | ORAL | 0 refills | Status: DC

## 2019-05-28 NOTE — Progress Notes (Signed)
  Subjective:     Patient ID: Marie Mills, female   DOB: 1971/04/06, 49 y.o.   MRN: [id number]  HPI Marie Mills is a 49 year old white female, [id number] in to discuss getting back on adipex, has gained weight and has less energy.   Review of Systems Has gained 15 lbs in less than 2 months, just wants to eat, energy level is down Reviewed past medical,surgical, social and family history. Reviewed medications and allergies.     Objective:   Physical Exam BP 133/79 (BP Location: Left Wrist, Patient Position: Sitting, Cuff Size: Normal)   Pulse 94   Ht 5\' 6"  (1.676 m)   Wt 245 lb 6.4 oz (111.3 kg)   LMP 05/23/2019 (Exact Date)   BMI 39.61 kg/m  Skin warm and dry. Neck: mid line trachea, normal thyroid, good ROM, no lymphadenopathy noted. Lungs: clear to ausculation bilaterally. Cardiovascular: regular rate and rhythm.    Assessment:     1. Weight loss counseling, encounter for Increase water Decrease portion size Walk more Will refill adipex Meds ordered this encounter  Medications  . phentermine (ADIPEX-P) 37.5 MG tablet    Sig: Take 1 tablet (37.5 mg total) by mouth daily before breakfast.    Dispense:  30 tablet    Refill:  0    Order Specific Question:   Supervising Provider    Answer:   Elonda Husky, LUTHER H [[id number]]    2. Body mass index 39.0-39.9, adult     Plan:     Follow up in 4 weeks

## 2019-06-10 DIAGNOSIS — U071 COVID-19: Secondary | ICD-10-CM | POA: Diagnosis not present

## 2019-06-10 DIAGNOSIS — Z20828 Contact with and (suspected) exposure to other viral communicable diseases: Secondary | ICD-10-CM | POA: Diagnosis not present

## 2019-06-17 DIAGNOSIS — Z20828 Contact with and (suspected) exposure to other viral communicable diseases: Secondary | ICD-10-CM | POA: Diagnosis not present

## 2019-06-17 DIAGNOSIS — U071 COVID-19: Secondary | ICD-10-CM | POA: Diagnosis not present

## 2019-06-24 ENCOUNTER — Telehealth: Payer: Self-pay | Admitting: Adult Health

## 2019-06-24 NOTE — Telephone Encounter (Signed)

## 2019-06-25 ENCOUNTER — Ambulatory Visit: Payer: Medicaid Other | Admitting: Adult Health

## 2019-07-01 DIAGNOSIS — Z20828 Contact with and (suspected) exposure to other viral communicable diseases: Secondary | ICD-10-CM | POA: Diagnosis not present

## 2019-07-01 DIAGNOSIS — U071 COVID-19: Secondary | ICD-10-CM | POA: Diagnosis not present

## 2019-07-03 ENCOUNTER — Telehealth: Payer: Self-pay | Admitting: Adult Health

## 2019-07-03 NOTE — Telephone Encounter (Signed)

## 2019-07-04 ENCOUNTER — Other Ambulatory Visit: Payer: Self-pay

## 2019-07-04 ENCOUNTER — Encounter: Payer: Self-pay | Admitting: Adult Health

## 2019-07-04 ENCOUNTER — Ambulatory Visit (INDEPENDENT_AMBULATORY_CARE_PROVIDER_SITE_OTHER): Payer: Medicaid Other | Admitting: Adult Health

## 2019-07-04 VITALS — BP 147/82 | HR 107 | Ht 66.0 in | Wt 256.4 lb

## 2019-07-04 DIAGNOSIS — R635 Abnormal weight gain: Secondary | ICD-10-CM | POA: Diagnosis not present

## 2019-07-04 DIAGNOSIS — Z6841 Body Mass Index (BMI) 40.0 and over, adult: Secondary | ICD-10-CM

## 2019-07-04 DIAGNOSIS — Z713 Dietary counseling and surveillance: Secondary | ICD-10-CM

## 2019-07-04 MED ORDER — PHENTERMINE HCL 37.5 MG PO TABS: 37.5000 mg | ORAL_TABLET | Freq: Every day | ORAL | 0 refills | Status: DC

## 2019-07-04 NOTE — Progress Notes (Signed)
  Subjective:     Patient ID: Marie Mills, female   DOB: [birth year], 49 y.o.   MRN: [id number]  HPI Marie Mills is a 49 year old white female, [id number] in for weight and BP check, she has been off adipex and on steroids for her back and has gained weight.She is working at [employer].  PCP is Dr Sherrie Sport.   Review of Systems Weight gain has been off adipex and on steroids for back Has sciatica Reviewed past medical,surgical, social and family history. Reviewed medications and allergies.      Objective:   Physical Exam BP (!) 147/82 (BP Location: Left Arm, Patient Position: Sitting, Cuff Size: Normal)   Pulse (!) 107   Ht 5\' 6"  (1.676 m)   Wt 256 lb 6.4 oz (116.3 kg)   BMI 41.38 kg/m  Skin warm and dry. Neck: mid line trachea, normal thyroid, good ROM, no lymphadenopathy noted. Lungs: clear to ausculation bilaterally. Cardiovascular: regular rate and rhythm. Gained 9 lbs has been on steroids for back     Assessment:     1. Weight loss counseling, encounter for Get back on diet Will rx adipex Meds ordered this encounter  Medications  . phentermine (ADIPEX-P) 37.5 MG tablet    Sig: Take 1 tablet (37.5 mg total) by mouth daily before breakfast.    Dispense:  30 tablet    Refill:  0    Order Specific Question:   Supervising Provider    Answer:   Elonda Husky, LUTHER H [[id number]]    2. Body mass index 40.0-44.9, adult ([place])   3. Weight gain     Plan:     Follow up in 4 weeks Call PCP about back

## 2019-07-08 DIAGNOSIS — U071 COVID-19: Secondary | ICD-10-CM | POA: Diagnosis not present

## 2019-07-08 DIAGNOSIS — Z20828 Contact with and (suspected) exposure to other viral communicable diseases: Secondary | ICD-10-CM | POA: Diagnosis not present

## 2019-07-15 DIAGNOSIS — U071 COVID-19: Secondary | ICD-10-CM | POA: Diagnosis not present

## 2019-07-15 DIAGNOSIS — Z20828 Contact with and (suspected) exposure to other viral communicable diseases: Secondary | ICD-10-CM | POA: Diagnosis not present

## 2019-07-16 DIAGNOSIS — Z20828 Contact with and (suspected) exposure to other viral communicable diseases: Secondary | ICD-10-CM | POA: Diagnosis not present

## 2019-07-16 DIAGNOSIS — U071 COVID-19: Secondary | ICD-10-CM | POA: Diagnosis not present

## 2019-07-22 ENCOUNTER — Emergency Department (HOSPITAL_COMMUNITY)
Admission: EM | Admit: 2019-07-22 | Discharge: 2019-07-22 | Disposition: A | Discharge status: Home / Self Care | Payer: Medicaid Other | Attending: Emergency Medicine | Admitting: Emergency Medicine

## 2019-07-22 ENCOUNTER — Encounter (HOSPITAL_COMMUNITY): Payer: Self-pay | Admitting: *Deleted

## 2019-07-22 ENCOUNTER — Other Ambulatory Visit: Payer: Self-pay

## 2019-07-22 DIAGNOSIS — G458 Other transient cerebral ischemic attacks and related syndromes: Secondary | ICD-10-CM | POA: Diagnosis not present

## 2019-07-22 DIAGNOSIS — I10 Essential (primary) hypertension: Secondary | ICD-10-CM | POA: Insufficient documentation

## 2019-07-22 DIAGNOSIS — Z79899 Other long term (current) drug therapy: Secondary | ICD-10-CM | POA: Diagnosis not present

## 2019-07-22 DIAGNOSIS — R29810 Facial weakness: Secondary | ICD-10-CM | POA: Diagnosis not present

## 2019-07-22 HISTORY — DX: Gastro-esophageal reflux disease without esophagitis: K21.9

## 2019-07-22 NOTE — Discharge Instructions (Addendum)
You do not appear to have any loss of sensation to your face and you are able to smile symmetrically.  If you start getting numbness to your face and your left face does not move, either call your primary care doctor to get treated for Bell's palsy or return to the ED to start on those medications.  Right now your symptoms appear to be too early to make that diagnosis.

## 2019-07-22 NOTE — ED Provider Notes (Signed)
[facility] EMERGENCY DEPARTMENT Provider Note   CSN: [id number] Arrival date & time: 07/22/19  0316   Time seen 4:03 AM  History Chief Complaint  Patient presents with  . Facial Droop    Marie Mills is a 49 y.o. female.  HPI   Patient states she felt fine all day today.  She states about 8 PM she was looking at pictures she had taken during the day and thought that the left side of her face looked tilted or her face was "sloped"..  She denies any numbness.  She states sometimes the left side of her mouth feels "weird" and she can feel her lips touching her teeth.  She denies headache, earache, change in her vision, nausea, vomiting, states she is walking okay.  PCP Neale Burly, MD   Past Medical History:  Diagnosis Date  . BV (bacterial vaginosis) 08/29/2013  . Contraceptive management 03/21/2013  . GERD (gastroesophageal reflux disease)   . Hypertension   . Obesity   . Vaginal irritation 08/29/2013    Patient Active Problem List   Diagnosis Date Noted  . Weight gain 07/04/2019  . Body mass index 39.0-39.9, adult 05/28/2019  . Body mass index 37.0-37.9, adult 04/08/2019  . Body mass index 38.0-38.9, adult 02/04/2019  . Body mass index 40.0-44.9, adult ([place]) 11/01/2018  . Encounter for well woman exam with routine gynecological exam 10/08/2018  . Weight loss counseling, encounter for 10/08/2018  . Body mass index 45.0-49.9, adult ([place]) 10/08/2018  . 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 present 05/30/2016  . Surveillance for birth control, oral contraceptives 05/30/2016  . Family planning 05/30/2016  . Screening for colorectal cancer 05/30/2016  . Vaginal irritation 08/29/2013  . BV (bacterial vaginosis) 08/29/2013  . Essential hypertension 03/21/2013  . Contraceptive management 03/21/2013    Past Surgical History:  Procedure Laterality Date  . CHOLECYSTECTOMY       OB History    Gravida  4   Para  3   Term  3   Preterm      AB  1   Living  3      SAB      TAB  1   Ectopic      Multiple      Live Births  3           Family History  Problem Relation Age of Onset  . Hypertension Mother   . Hyperlipidemia Mother   . Diabetes Mother   . Hyperlipidemia Father   . Hypertension Father   . Diabetes Father   . Seizures Father   . Cancer Maternal Aunt        breast  . Diabetes Sister   . Hypertension Sister   . Diabetes Sister   . Hypertension Sister   . Diabetes Brother   . Hypertension Brother   . Hypertension Sister   . Asthma Son   . Asthma Daughter     Social History   Tobacco Use  . Smoking status: Never Smoker  . Smokeless tobacco: Never Used  Substance Use Topics  . Alcohol use: No  . Drug use: No  employed  Home Medications Prior to Admission medications   Medication Sig Start Date End Date Taking? Authorizing Provider  lisinopril-hydrochlorothiazide (ZESTORETIC) 20-12.5 MG tablet Take 1 tablet by mouth daily. 02/04/19  Yes Estill Dooms, NP  Norgestimate-Ethinyl Estradiol Triphasic (TRI-SPRINTEC) 0.18/0.215/0.25 MG-35 MCG tablet Take 1 tablet by mouth daily. 10/08/18  Yes Derrek Monaco  A, NP  omeprazole (PRILOSEC) 40 MG capsule TK 1 C PO QD 03/04/19  Yes Derrek Monaco A, NP  phentermine (ADIPEX-P) 37.5 MG tablet Take 1 tablet (37.5 mg total) by mouth daily before breakfast. 07/04/19  Yes Estill Dooms, NP  losartan (COZAAR) 50 MG tablet Take 50 mg by mouth daily. 04/23/19   [provider]  metoprolol succinate (TOPROL-XL) 25 MG 24 hr tablet Take 25 mg by mouth daily. 04/23/19   [provider]    Allergies    Penicillins, Codeine, and Sulfa antibiotics  Review of Systems   Review of Systems  All other systems reviewed and are negative.   Physical Exam Updated Vital Signs BP (!) 145/85   Pulse (!) 102   Temp 98.5 F (36.9 C) (Oral)   Resp 18   Ht 5\' 6"  (1.676 m)   Wt 113.4 kg   LMP 07/21/2019   SpO2 100%   BMI 40.35 kg/m   Physical  Exam Vitals and nursing note reviewed.  Constitutional:      Appearance: Normal appearance. She is obese.  HENT:     Head: Normocephalic and atraumatic.     Right Ear: External ear normal.     Left Ear: External ear normal.     Nose: Nose normal.     Mouth/Throat:     Mouth: Mucous membranes are moist.  Eyes:     Extraocular Movements: Extraocular movements intact.     Conjunctiva/sclera: Conjunctivae normal.     Pupils: Pupils are equal, round, and reactive to light.  Cardiovascular:     Rate and Rhythm: Normal rate and regular rhythm.  Pulmonary:     Effort: Pulmonary effort is normal. No respiratory distress.  Musculoskeletal:        General: No swelling or deformity. Normal range of motion.     Cervical back: Normal range of motion.  Skin:    General: Skin is warm and dry.     Findings: No bruising.  Neurological:     General: No focal deficit present.     Mental Status: She is alert and oriented to person, place, and time.     Cranial Nerves: No cranial nerve deficit.     Comments: Grips are equal, there is no weakness in her lower extremities, her face is symmetrical.  She can smile symmetrically.  She raises her eyebrows normally bilaterally.  She has good range of motion of her tongue.  She appears to have intact sensation to light touch equally on both sides of her face.  Psychiatric:        Mood and Affect: Mood normal.        Behavior: Behavior normal.        Thought Content: Thought content normal.     ED Results / Procedures / Treatments   Labs (all labs ordered are listed, but only abnormal results are displayed) Labs Reviewed - No data to display  EKG None  Radiology No results found.  Procedures Procedures (including critical care time)  Medications Ordered in ED Medications - No data to display  ED Course  I have reviewed the triage vital signs and the nursing notes.  Pertinent labs & imaging results that were available during my care of the  patient were reviewed by me and considered in my medical decision making (see chart for details).    MDM Rules/Calculators/A&P  I looked at the patient's pictures and I thought she was having maybe some drooping on the right side where she has a dimple but patient thought it was her left side.  Although on the pictures her smile may have been not totally horizontal she has normal smiling in the ED.  She has no pain in her ear, there is no drooping at rest.  She has equal movement of her eyebrows.  This may be very early Bell's palsy but at this point I am not convinced enough to start her on medication.    Final Clinical Impression(s) / ED Diagnoses Final diagnoses:  Facial droop    Rx / DC Orders ED Discharge Orders    None      Plan discharge  Rolland Porter, MD, Barbette Or, MD 07/22/19 [phone]

## 2019-07-22 NOTE — ED Triage Notes (Signed)
Pt c/o left side facial droop that started earlier in the afternoon, pt unsure of what time it started, states that she noticed around 8 pm in pictures that she was in . Pt denies any other symptoms, grips equal, gait steady.

## 2019-07-31 ENCOUNTER — Telehealth: Payer: Self-pay | Admitting: Adult Health

## 2019-07-31 NOTE — Telephone Encounter (Signed)

## 2019-08-01 ENCOUNTER — Ambulatory Visit: Payer: Medicaid Other | Admitting: Adult Health

## 2019-08-05 DIAGNOSIS — G459 Transient cerebral ischemic attack, unspecified: Secondary | ICD-10-CM | POA: Diagnosis not present

## 2019-08-05 DIAGNOSIS — G458 Other transient cerebral ischemic attacks and related syndromes: Secondary | ICD-10-CM | POA: Diagnosis not present

## 2019-08-05 DIAGNOSIS — U071 COVID-19: Secondary | ICD-10-CM | POA: Diagnosis not present

## 2019-08-05 DIAGNOSIS — Z20828 Contact with and (suspected) exposure to other viral communicable diseases: Secondary | ICD-10-CM | POA: Diagnosis not present

## 2019-08-08 ENCOUNTER — Telehealth: Payer: Self-pay | Admitting: Adult Health

## 2019-08-08 NOTE — Telephone Encounter (Signed)

## 2019-08-09 ENCOUNTER — Other Ambulatory Visit: Payer: Self-pay

## 2019-08-09 ENCOUNTER — Ambulatory Visit (INDEPENDENT_AMBULATORY_CARE_PROVIDER_SITE_OTHER): Payer: Medicaid Other | Admitting: Adult Health

## 2019-08-09 ENCOUNTER — Encounter: Payer: Self-pay | Admitting: Adult Health

## 2019-08-09 VITALS — BP 139/76 | HR 93 | Ht 66.0 in | Wt 250.0 lb

## 2019-08-09 DIAGNOSIS — Z713 Dietary counseling and surveillance: Secondary | ICD-10-CM | POA: Diagnosis not present

## 2019-08-09 DIAGNOSIS — Z6841 Body Mass Index (BMI) 40.0 and over, adult: Secondary | ICD-10-CM

## 2019-08-09 MED ORDER — PHENTERMINE HCL 37.5 MG PO TABS: 37.5000 mg | ORAL_TABLET | Freq: Every day | ORAL | 0 refills | Status: DC

## 2019-08-09 NOTE — Progress Notes (Signed)
  Subjective:     Patient ID: Marie Mills, female   DOB: [birth year], 49 y.o.   MRN: [id number]  HPI Marie Mills is a 49 year old white female,[id number] in for weight and BP check PCP is Dr Sherrie Sport.  Review of Systems  +weight loss  has sciatic nerve pain, has had X-rays, may get MRI, is on prednisone,adn she is taking tylenol, try advil/tylenol combo pill  Reviewed past medical,surgical, social and family history. Reviewed medications and allergies.     Objective:   Physical Exam BP 139/76 (BP Location: Left Arm, Patient Position: Sitting, Cuff Size: Large)   Pulse 93   Ht 5\' 6"  (1.676 m)   Wt 250 lb (113.4 kg)   LMP 07/21/2019   BMI 40.35 kg/m  Skin warm and dry. Lungs: clear to ausculation bilaterally. Cardiovascular: regular rate and rhythm.she lost 6 lbs in spite of taking prednisone again for sciatic pain     Assessment:     1. Weight loss counseling, encounter for Continue weight loss efforts Will rx adipex Meds ordered this encounter  Medications  . phentermine (ADIPEX-P) 37.5 MG tablet    Sig: Take 1 tablet (37.5 mg total) by mouth daily before breakfast.    Dispense:  30 tablet    Refill:  0    Order Specific Question:   Supervising Provider    Answer:   Tania Ade H [[id number]]  Increase water Walk  2. Body mass index 40.0-44.9, adult ([facility])     Plan:     Follow up in 4 weeks

## 2019-09-05 ENCOUNTER — Telehealth: Payer: Self-pay | Admitting: Adult Health

## 2019-09-05 NOTE — Telephone Encounter (Signed)

## 2019-09-06 ENCOUNTER — Ambulatory Visit: Payer: Medicaid Other | Admitting: Adult Health

## 2019-09-06 ENCOUNTER — Encounter: Payer: Self-pay | Admitting: Adult Health

## 2019-09-06 VITALS — BP 134/80 | HR 86 | Ht 66.0 in | Wt 246.8 lb

## 2019-09-06 DIAGNOSIS — Z713 Dietary counseling and surveillance: Secondary | ICD-10-CM | POA: Diagnosis not present

## 2019-09-06 DIAGNOSIS — Z6839 Body mass index (BMI) 39.0-39.9, adult: Secondary | ICD-10-CM

## 2019-09-06 MED ORDER — PHENTERMINE HCL 37.5 MG PO TABS: 37.5000 mg | ORAL_TABLET | Freq: Every day | ORAL | 0 refills | Status: DC

## 2019-09-06 NOTE — Progress Notes (Signed)
  Subjective:     Patient ID: Marie Mills, female   DOB: 26-Jan-1971, 49 y.o.   MRN: [id number]  HPI Marie Mills is a 49 year old white female, [id number] in for weight and BP check.   Review of Systems Has lost this month,  Denies any headaches, or trouble sleeping Has bumps on mons pubis Reviewed past medical,surgical, social and family history. Reviewed medications and allergies.     Objective:   Physical Exam BP 134/80 (BP Location: Left Arm, Patient Position: Sitting, Cuff Size: Normal)   Pulse 86   Ht 5\' 6"  (1.676 m)   Wt 246 lb 12.8 oz (111.9 kg)   LMP 08/07/2019   BMI 39.83 kg/m  Skin warm and dry.Lungs: clear to ausculation bilaterally. Cardiovascular: regular rate and rhythm. Has few hair bumps on mons pubis, stop shaving    Has lost 4 more lbs for a total of 10. Assessment:     1. Weight loss counseling, encounter for Increase walking Will refill adipex Meds ordered this encounter  Medications  . phentermine (ADIPEX-P) 37.5 MG tablet    Sig: Take 1 tablet (37.5 mg total) by mouth daily before breakfast.    Dispense:  30 tablet    Refill:  0    Order Specific Question:   Supervising Provider    Answer:   Elonda Husky, LUTHER H [[id number]]    2. Body mass index 39.0-39.9, adult    Plan:     Follow up in 4 weeks

## 2019-10-01 DIAGNOSIS — Z0000 Encounter for general adult medical examination without abnormal findings: Secondary | ICD-10-CM | POA: Diagnosis not present

## 2019-10-01 DIAGNOSIS — G458 Other transient cerebral ischemic attacks and related syndromes: Secondary | ICD-10-CM | POA: Diagnosis not present

## 2019-10-01 DIAGNOSIS — M545 Low back pain: Secondary | ICD-10-CM | POA: Diagnosis not present

## 2019-10-01 DIAGNOSIS — I10 Essential (primary) hypertension: Secondary | ICD-10-CM | POA: Diagnosis not present

## 2019-10-03 ENCOUNTER — Telehealth: Payer: Self-pay | Admitting: Adult Health

## 2019-10-03 NOTE — Telephone Encounter (Signed)
Called patient regarding appointment and the following message was left:   Updated visitor policy: We are now allowing one support person with you during your upcoming visit.  However, we do ask that they wear a mask and will also be screened at check-in.   We ask if you are sick, have any symptoms of COVID, have had any exposure to anyone suspected or confirmed of having COVID-19, or are awaiting test results for COVID-19, to call our office as we may need to reschedule you for a virtual visit or schedule your appointment for a later date.    Please know we will ask you these questions or similar questions when you arrive for your appointment and understand this is how we are keeping everyone safe.    Also,to keep you safe, please use the provided hand sanitizer when you enter the office. We are asking everyone in the office to wear a mask to help prevent the spread of germs. If you have a mask of your own, please wear it to your appointment, if not, we are happy to provide one for you.  Thank you for understanding and your cooperation.    [facility] Staff

## 2019-10-04 ENCOUNTER — Ambulatory Visit: Payer: Medicaid Other | Admitting: Adult Health

## 2019-10-14 ENCOUNTER — Ambulatory Visit: Payer: Medicaid Other | Admitting: Adult Health

## 2019-11-10 ENCOUNTER — Other Ambulatory Visit: Payer: Self-pay | Admitting: Adult Health

## 2019-11-15 DIAGNOSIS — M549 Dorsalgia, unspecified: Secondary | ICD-10-CM | POA: Diagnosis not present

## 2019-11-15 DIAGNOSIS — M545 Low back pain: Secondary | ICD-10-CM | POA: Diagnosis not present

## 2019-11-15 DIAGNOSIS — M546 Pain in thoracic spine: Secondary | ICD-10-CM | POA: Diagnosis not present

## 2019-12-03 DIAGNOSIS — Z20828 Contact with and (suspected) exposure to other viral communicable diseases: Secondary | ICD-10-CM | POA: Diagnosis not present

## 2019-12-03 DIAGNOSIS — U071 COVID-19: Secondary | ICD-10-CM | POA: Diagnosis not present

## 2019-12-17 DIAGNOSIS — M48061 Spinal stenosis, lumbar region without neurogenic claudication: Secondary | ICD-10-CM | POA: Diagnosis not present

## 2019-12-17 DIAGNOSIS — M545 Low back pain: Secondary | ICD-10-CM | POA: Diagnosis not present

## 2019-12-17 DIAGNOSIS — M5126 Other intervertebral disc displacement, lumbar region: Secondary | ICD-10-CM | POA: Diagnosis not present

## 2019-12-30 DIAGNOSIS — U071 COVID-19: Secondary | ICD-10-CM | POA: Diagnosis not present

## 2019-12-30 DIAGNOSIS — Z20828 Contact with and (suspected) exposure to other viral communicable diseases: Secondary | ICD-10-CM | POA: Diagnosis not present

## 2020-01-16 DIAGNOSIS — J301 Allergic rhinitis due to pollen: Secondary | ICD-10-CM | POA: Diagnosis not present

## 2020-01-16 DIAGNOSIS — Z6838 Body mass index (BMI) 38.0-38.9, adult: Secondary | ICD-10-CM | POA: Diagnosis not present

## 2020-02-05 DIAGNOSIS — Z6839 Body mass index (BMI) 39.0-39.9, adult: Secondary | ICD-10-CM | POA: Diagnosis not present

## 2020-02-05 DIAGNOSIS — J302 Other seasonal allergic rhinitis: Secondary | ICD-10-CM | POA: Diagnosis not present

## 2020-02-05 DIAGNOSIS — L278 Dermatitis due to other substances taken internally: Secondary | ICD-10-CM | POA: Diagnosis not present

## 2020-02-13 DIAGNOSIS — Z20828 Contact with and (suspected) exposure to other viral communicable diseases: Secondary | ICD-10-CM | POA: Diagnosis not present

## 2020-02-13 DIAGNOSIS — U071 COVID-19: Secondary | ICD-10-CM | POA: Diagnosis not present

## 2020-02-25 DIAGNOSIS — U071 COVID-19: Secondary | ICD-10-CM | POA: Diagnosis not present

## 2020-02-25 DIAGNOSIS — Z20828 Contact with and (suspected) exposure to other viral communicable diseases: Secondary | ICD-10-CM | POA: Diagnosis not present

## 2020-04-27 DIAGNOSIS — J40 Bronchitis, not specified as acute or chronic: Secondary | ICD-10-CM | POA: Diagnosis not present

## 2020-04-27 DIAGNOSIS — Z6841 Body Mass Index (BMI) 40.0 and over, adult: Secondary | ICD-10-CM | POA: Diagnosis not present

## 2020-05-01 DIAGNOSIS — J069 Acute upper respiratory infection, unspecified: Secondary | ICD-10-CM | POA: Diagnosis not present

## 2020-05-01 DIAGNOSIS — J029 Acute pharyngitis, unspecified: Secondary | ICD-10-CM | POA: Diagnosis not present

## 2020-05-01 DIAGNOSIS — J209 Acute bronchitis, unspecified: Secondary | ICD-10-CM | POA: Diagnosis not present

## 2020-05-01 DIAGNOSIS — J019 Acute sinusitis, unspecified: Secondary | ICD-10-CM | POA: Diagnosis not present

## 2020-05-20 DIAGNOSIS — J019 Acute sinusitis, unspecified: Secondary | ICD-10-CM | POA: Diagnosis not present

## 2020-06-11 DIAGNOSIS — Z20828 Contact with and (suspected) exposure to other viral communicable diseases: Secondary | ICD-10-CM | POA: Diagnosis not present

## 2020-06-11 DIAGNOSIS — U071 COVID-19: Secondary | ICD-10-CM | POA: Diagnosis not present

## 2020-06-29 ENCOUNTER — Encounter (HOSPITAL_COMMUNITY): Payer: Self-pay | Admitting: *Deleted

## 2020-06-29 ENCOUNTER — Emergency Department (HOSPITAL_COMMUNITY)
Admission: EM | Admit: 2020-06-29 | Discharge: 2020-06-29 | Disposition: A | Discharge status: Home / Self Care | Payer: Medicaid Other | Attending: Emergency Medicine | Admitting: Emergency Medicine

## 2020-06-29 ENCOUNTER — Emergency Department (HOSPITAL_COMMUNITY): Payer: Medicaid Other

## 2020-06-29 ENCOUNTER — Other Ambulatory Visit: Payer: Self-pay

## 2020-06-29 DIAGNOSIS — Z79899 Other long term (current) drug therapy: Secondary | ICD-10-CM | POA: Insufficient documentation

## 2020-06-29 DIAGNOSIS — E876 Hypokalemia: Secondary | ICD-10-CM | POA: Diagnosis not present

## 2020-06-29 DIAGNOSIS — R079 Chest pain, unspecified: Secondary | ICD-10-CM | POA: Insufficient documentation

## 2020-06-29 DIAGNOSIS — R0789 Other chest pain: Secondary | ICD-10-CM | POA: Diagnosis not present

## 2020-06-29 DIAGNOSIS — I10 Essential (primary) hypertension: Secondary | ICD-10-CM | POA: Diagnosis not present

## 2020-06-29 DIAGNOSIS — K219 Gastro-esophageal reflux disease without esophagitis: Secondary | ICD-10-CM | POA: Diagnosis not present

## 2020-06-29 DIAGNOSIS — Z7982 Long term (current) use of aspirin: Secondary | ICD-10-CM | POA: Diagnosis not present

## 2020-06-29 LAB — CBC
HCT: 39.3 % (ref 36.0–46.0)
Hemoglobin: 12.8 g/dL (ref 12.0–15.0)
MCH: 29 pg (ref 26.0–34.0)
MCHC: 32.6 g/dL (ref 30.0–36.0)
MCV: 89.1 fL (ref 80.0–100.0)
Platelets: 253 10*3/uL (ref 150–400)
RBC: 4.41 MIL/uL (ref 3.87–5.11)
RDW: 13.9 % (ref 11.5–15.5)
WBC: 8.2 10*3/uL (ref 4.0–10.5)
nRBC: 0 % (ref 0.0–0.2)

## 2020-06-29 LAB — BASIC METABOLIC PANEL
Anion gap: 11 (ref 5–15)
BUN: 22 mg/dL — ABNORMAL HIGH (ref 6–20)
CO2: 22 mmol/L (ref 22–32)
Calcium: 8.4 mg/dL — ABNORMAL LOW (ref 8.9–10.3)
Chloride: 109 mmol/L (ref 98–111)
Creatinine, Ser: 0.53 mg/dL (ref 0.44–1.00)
GFR, Estimated: >60 mL/min (ref >60)
Glucose, Bld: 92 mg/dL (ref 70–99)
Potassium: 2.9 mmol/L — ABNORMAL LOW (ref 3.5–5.1)
Sodium: 142 mmol/L (ref 135–145)

## 2020-06-29 LAB — TROPONIN I (HIGH SENSITIVITY)
Troponin I (High Sensitivity): <2 ng/L (ref <18)
Troponin I (High Sensitivity): <2 ng/L (ref <18)

## 2020-06-29 MED ORDER — ASPIRIN 81 MG PO CHEW
324.0000 mg | CHEWABLE_TABLET | Freq: Once | ORAL | Status: AC
  Administered 2020-06-29: 324 mg via ORAL
  Filled 2020-06-29: qty 4

## 2020-06-29 MED ORDER — POTASSIUM CHLORIDE CRYS ER 20 MEQ PO TBCR
40.0000 meq | EXTENDED_RELEASE_TABLET | Freq: Once | ORAL | Status: AC
  Administered 2020-06-29: 40 meq via ORAL
  Filled 2020-06-29: qty 2

## 2020-06-29 MED ORDER — POTASSIUM CHLORIDE CRYS ER 20 MEQ PO TBCR: 20.0000 meq | EXTENDED_RELEASE_TABLET | Freq: Once | ORAL | 0 refills | Status: DC

## 2020-06-29 NOTE — ED Triage Notes (Signed)
Pt with left sided cp that is cramping and stabbing.  Pt states PCP sent here due to family hx of cardiac dz.

## 2020-06-29 NOTE — Discharge Instructions (Addendum)
Schedule to see Cardiology for complete evaluation  take potassium as directed

## 2020-06-29 NOTE — ED Provider Notes (Signed)
[facility] Provider Note   CSN: [id number] Arrival date & time: 06/29/20  1405     History Chief Complaint  Patient presents with  . Chest Pain    Marie Mills is a 50 y.o. female.  The history is provided by the patient. No language interpreter was used.  Chest Pain Pain location:  L chest Pain quality: aching   Pain radiates to:  Does not radiate Pain severity:  Moderate Onset quality:  Gradual Timing:  Constant Progression:  Worsening Chronicity:  New Relieved by:  Nothing Worsened by:  Nothing Ineffective treatments:  None tried Associated symptoms: no cough, no fever and no headache   Risk factors: hypertension        Past Medical History:  Diagnosis Date  . BV (bacterial vaginosis) 08/29/2013  . Contraceptive management 03/21/2013  . GERD (gastroesophageal reflux disease)   . Hypertension   . Obesity   . Vaginal irritation 08/29/2013    Patient Active Problem List   Diagnosis Date Noted  . Weight gain 07/04/2019  . Body mass index 39.0-39.9, adult 05/28/2019  . Body mass index 37.0-37.9, adult 04/08/2019  . Body mass index 38.0-38.9, adult 02/04/2019  . Body mass index 40.0-44.9, adult ([place]) 11/01/2018  . Encounter for well woman exam with routine gynecological exam 10/08/2018  . Weight loss counseling, encounter for 10/08/2018  . Body mass index 45.0-49.9, adult ([place]) 10/08/2018  . 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 present 05/30/2016  . Surveillance for birth control, oral contraceptives 05/30/2016  . Family planning 05/30/2016  . Screening for colorectal cancer 05/30/2016  . Vaginal irritation 08/29/2013  . BV (bacterial vaginosis) 08/29/2013  . Essential hypertension 03/21/2013  . Contraceptive management 03/21/2013    Past Surgical History:  Procedure Laterality Date  . CHOLECYSTECTOMY       OB History    Gravida  4   Para  3   Term  3   Preterm      AB  1   Living  3     SAB      IAB  1    Ectopic      Multiple      Live Births  3           Family History  Problem Relation Age of Onset  . Hypertension Mother   . Hyperlipidemia Mother   . Diabetes Mother   . Hyperlipidemia Father   . Hypertension Father   . Diabetes Father   . Seizures Father   . Cancer Maternal Aunt        breast  . Diabetes Sister   . Hypertension Sister   . Diabetes Sister   . Hypertension Sister   . Diabetes Brother   . Hypertension Brother   . Hypertension Sister   . Asthma Son   . Asthma Daughter     Social History   Tobacco Use  . Smoking status: Never Smoker  . Smokeless tobacco: Never Used  Vaping Use  . Vaping Use: Never used  Substance Use Topics  . Alcohol use: No  . Drug use: No    Home Medications Prior to Admission medications   Medication Sig Start Date End Date Taking? Authorizing Provider  aspirin EC 81 MG tablet Take 81 mg by mouth daily. Swallow whole.   Yes [provider]  ibuprofen (ADVIL) 800 MG tablet Take 800 mg by mouth 3 (three) times daily. 06/23/20  Yes [provider]  lisinopril-hydrochlorothiazide (ZESTORETIC)  20-12.5 MG tablet Take 1 tablet by mouth daily. 02/04/19  Yes Derrek Monaco A, NP  losartan (COZAAR) 50 MG tablet Take 50 mg by mouth daily. 04/23/19  Yes [provider]  methocarbamol (ROBAXIN) 500 MG tablet Take 500 mg by mouth at bedtime. 06/25/20  Yes [provider]  metoprolol succinate (TOPROL-XL) 25 MG 24 hr tablet Take 25 mg by mouth daily. 04/23/19  Yes [provider]  omeprazole (PRILOSEC) 40 MG capsule TK 1 C PO QD Patient taking differently: Take 40 mg by mouth daily. 03/04/19  Yes Derrek Monaco A, NP  potassium chloride SA (KLOR-CON) 20 MEQ tablet Take 1 tablet (20 mEq total) by mouth once for 1 dose. 06/29/20 06/29/20 Yes Sofia, Leslie K, PA-C  TRI-SPRINTEC 0.18/0.215/0.25 MG-35 MCG tablet TAKE 1 TABLET BY MOUTH DAILY Patient taking differently: Take 1 tablet by mouth daily.  11/11/19  Yes Estill Dooms, NP  phentermine (ADIPEX-P) 37.5 MG tablet Take 1 tablet (37.5 mg total) by mouth daily before breakfast. Patient not taking: No sig reported 09/06/19   Estill Dooms, NP    Allergies    Penicillins, Codeine, and Sulfa antibiotics  Review of Systems   Review of Systems  Constitutional: Negative for fever.  Respiratory: Negative for cough.   Cardiovascular: Positive for chest pain.  Neurological: Negative for headaches.  All other systems reviewed and are negative.   Physical Exam Updated Vital Signs BP 132/78   Pulse 72   Temp 98.6 F (37 C)   Resp 16   Ht 5\' 6"  (1.676 m)   Wt 113.4 kg   LMP 06/15/2020   SpO2 97%   BMI 40.35 kg/m   Physical Exam Vitals and nursing note reviewed.  Constitutional:      Appearance: She is well-developed.  HENT:     Head: Normocephalic.  Cardiovascular:     Rate and Rhythm: Normal rate.     Heart sounds: Normal heart sounds.  Pulmonary:     Effort: Pulmonary effort is normal.     Breath sounds: Normal breath sounds.  Abdominal:     General: There is no distension.     Palpations: Abdomen is soft.  Musculoskeletal:        General: Normal range of motion.     Cervical back: Normal range of motion.  Skin:    General: Skin is warm.  Neurological:     General: No focal deficit present.     Mental Status: She is alert and oriented to person, place, and time.  Psychiatric:        Mood and Affect: Mood normal.     ED Results / Procedures / Treatments   Labs (all labs ordered are listed, but only abnormal results are displayed) Labs Reviewed  BASIC METABOLIC PANEL - Abnormal; Notable for the following components:      Result Value   Potassium 2.9 (*)    BUN 22 (*)    Calcium 8.4 (*)    All other components within normal limits  CBC  POC URINE PREG, ED  TROPONIN I (HIGH SENSITIVITY)  TROPONIN I (HIGH SENSITIVITY)    EKG EKG Interpretation  Date/Time:  Monday June 29 2020 14:18:34  EDT Ventricular Rate:  87 PR Interval:    QRS Duration: 88 QT Interval:  383 QTC Calculation: 461 R Axis:   44 Text Interpretation: Sinus rhythm Borderline T abnormalities, anterior leads No significant change since last tracing Confirmed by Fredia Sorrow [phone]) on 06/29/2020 2:22:03 PM  Radiology DG Chest Portable 1 View  Result Date: 06/29/2020 CLINICAL DATA:  Cramping stabbing LEFT-sided chest pain for 2-3 days, history hypertension, GERD EXAM: PORTABLE CHEST 1 VIEW COMPARISON:  Portable exam 1437 hours compared to 04/20/2018 FINDINGS: Normal heart size, mediastinal contours, and pulmonary vascularity. Lungs clear. No infiltrate, pleural effusion, or pneumothorax. Old healed fracture of the lateral LEFT sixth rib. IMPRESSION: No acute abnormalities. Electronically Signed   By: Lavonia Dana M.D.   On: 06/29/2020 14:48    Procedures Procedures   Medications Ordered in ED Medications  aspirin chewable tablet 324 mg (324 mg Oral Given 06/29/20 1908)  potassium chloride SA (KLOR-CON) CR tablet 40 mEq (40 mEq Oral Given 06/29/20 1908)    ED Course  I have reviewed the triage vital signs and the nursing notes.  Pertinent labs & imaging results that were available during my care of the patient were reviewed by me and considered in my medical decision making (see chart for details).  Clinical Course as of 06/29/20 1954  Mon Jun 29, 2020  1625 Troponin I (High Sensitivity) [LS]    Clinical Course User Index [LS] Sidney Ace   MDM Rules/Calculators/A&P                          MDM:  Troponin is negative x 2.  Chest xray normal.  Ekg no acute abnormality Pt has decreased potassium  Pt given rx for potassium.  Pt advised to schedule to see Cardiology.  Take a baby asa daily.  Final Clinical Impression(s) / ED Diagnoses Final diagnoses:  Nonspecific chest pain    Rx / DC Orders ED Discharge Orders         Ordered    potassium chloride SA (KLOR-CON) 20 MEQ tablet    Once        06/29/20 1858        An After Visit Summary was printed and given to the patient.    Sidney Ace 06/29/20 2009    Fredia Sorrow, MD 07/07/20 [phone]

## 2020-06-30 ENCOUNTER — Telehealth: Payer: Self-pay

## 2020-06-30 NOTE — Telephone Encounter (Signed)
Transition Care Management Unsuccessful Follow-up Telephone Call  Date of discharge and from where:  06/29/2020 from [facility]  Attempts:  1st Attempt  Reason for unsuccessful TCM follow-up call:  Left voice message

## 2020-06-30 NOTE — Progress Notes (Unsigned)
Cardiology Office Note   Date:  07/01/2020   ID:  Marie Mills, DOB 02/25/1971, MRN [id number]  PCP:  Neale Burly, MD  Cardiologist:   Dorris Carnes, MD   Pt is a 50 yo who is referred for CP      History of Present Illness: Marie Mills is a 50 y.o. 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  No SOB   Not pleuritic  She denied fevers or cough  Not positional   No injury  Episodes came and went over the next couple days   She finally went to ED on 06/29/20.  EKG done  Trop neg x 2     SInce leaving the ED she still is having spells    Bad   On and off   No SOB    Motrin doesn't helpt    FHx is significant for both parents with CAD   Mom died in 52s       Current Meds  Medication Sig  . aspirin EC 81 MG tablet Take 81 mg by mouth daily. Swallow whole.  . ibuprofen (ADVIL) 800 MG tablet Take 800 mg by mouth 3 (three) times daily.  Marland Kitchen lisinopril-hydrochlorothiazide (ZESTORETIC) 20-12.5 MG tablet Take 1 tablet by mouth daily.  Marland Kitchen losartan (COZAAR) 50 MG tablet Take 50 mg by mouth daily.  . methocarbamol (ROBAXIN) 500 MG tablet Take 500 mg by mouth at bedtime.  . metoprolol succinate (TOPROL-XL) 25 MG 24 hr tablet Take 25 mg by mouth daily.  Marland Kitchen omeprazole (PRILOSEC) 40 MG capsule TK 1 C PO QD (Patient taking differently: Take 40 mg by mouth daily.)  . potassium chloride SA (KLOR-CON) 20 MEQ tablet Take 1 tablet (20 mEq total) by mouth once for 1 dose.  . TRI-SPRINTEC 0.18/0.215/0.25 MG-35 MCG tablet TAKE 1 TABLET BY MOUTH DAILY (Patient taking differently: Take 1 tablet by mouth daily.)     Allergies:   Penicillins, Codeine, and Sulfa antibiotics   Past Medical History:  Diagnosis Date  . BV (bacterial vaginosis) 08/29/2013  . Contraceptive management  03/21/2013  . GERD (gastroesophageal reflux disease)   . Hypertension   . Obesity   . Vaginal irritation 08/29/2013    Past Surgical History:  Procedure Laterality Date  . CHOLECYSTECTOMY       Social History:  The patient  reports that she has never smoked. She has never used smokeless tobacco. She reports that she does not drink alcohol and does not use drugs.   Family History:  The patient's family history includes Asthma in her daughter and son; Cancer in her maternal aunt; Diabetes in her brother, father, mother, sister, and sister; Hyperlipidemia in her father and mother; Hypertension in her brother, father, mother, sister, sister, and sister; Seizures in her father.   Mom died at 3 of MI    ROS:  Please see the history of present illness. All other systems are reviewed and  Negative to the above problem except as noted.    PHYSICAL EXAM: VS:  BP (!) 148/92   Pulse 72   Ht 5\' 6"  (1.676 m)   Wt 268 lb (121.6 kg)  LMP 06/15/2020   SpO2 98%   BMI 43.26 kg/m   GEN:  Morbidly obese 50 yo  in no acute distress  HEENT: normal  Neck: no JVD,  No carotid bruits,  Cardiac: RRR; no murmurs  No LE  edema  Respiratory:  clear to auscultation bilaterally, normal work of breathing Chest:  Very tender over ribs on L chest (under breast)  Brings on pain   GI: soft, nontender, nondistended, + BS  No hepatomegaly  MS: no deformity Moving all extremities   Skin: warm and dry, no rash Neuro:  Strength and sensation are intact Psych: euthymic mood, full affect   EKG:  EKG is not ordered today.  IN ED  SR  87 bpm  Nonspecific ST changes     Lipid Panel No results found for: CHOL, TRIG, HDL, CHOLHDL, VLDL, LDLCALC, LDLDIRECT    Wt Readings from Last 3 Encounters:  07/01/20 268 lb (121.6 kg)  06/29/20 250 lb (113.4 kg)  09/06/19 246 lb 12.8 oz (111.9 kg)      ASSESSMENT AND PLAN:  1 Chest pain   Pt with CP that began on 06/27/20   Atypical for cardiac ischemia   On exam I can  bring on the pain with pushing ribs on L     I think overall is musculoskeletal   She must have strained herself   REcomm rest, antiinflammatories    2  HTN   BP is high today    She says it is like this often    Her K was low in the ED and she was given 1 dose of K    She says she used to be on chronically     Will check BMET    Consider aldactone if flow    (note CT of abdomen in past adrenals normal )    3  HCM  Lipids were good in the past   WIll recheck  4   Diet   Pt is beginning Keto diet   Discussed TRE     Will make adjustment to meds after reviewing labs   F/U later ths spring in clinic   Current medicines are reviewed at length with the patient today.  The patient does not have concerns regarding medicines.  Signed, Dorris Carnes, MD  07/01/2020 10:31 AM    [facility] [address], [place], [place]  [address] Phone: [phone]; Fax: [phone]

## 2020-07-01 ENCOUNTER — Other Ambulatory Visit (HOSPITAL_COMMUNITY)
Admission: RE | Admit: 2020-07-01 | Discharge: 2020-07-01 | Disposition: A | Discharge status: Home / Self Care | Payer: Medicaid Other | Source: Ambulatory Visit | Attending: Internal Medicine | Admitting: Internal Medicine

## 2020-07-01 ENCOUNTER — Other Ambulatory Visit: Payer: Self-pay

## 2020-07-01 ENCOUNTER — Encounter: Payer: Self-pay | Admitting: Internal Medicine

## 2020-07-01 ENCOUNTER — Ambulatory Visit: Payer: Medicaid Other | Admitting: Internal Medicine

## 2020-07-01 VITALS — BP 148/92 | HR 72 | Ht 66.0 in | Wt 268.0 lb

## 2020-07-01 DIAGNOSIS — Z79899 Other long term (current) drug therapy: Secondary | ICD-10-CM | POA: Insufficient documentation

## 2020-07-01 DIAGNOSIS — I10 Essential (primary) hypertension: Secondary | ICD-10-CM | POA: Diagnosis not present

## 2020-07-01 LAB — LIPID PANEL
Cholesterol: 152 mg/dL (ref 0–200)
HDL: 47 mg/dL (ref >40)
LDL Cholesterol: 74 mg/dL (ref 0–99)
Total CHOL/HDL Ratio: 3.2 RATIO
Triglycerides: 156 mg/dL — ABNORMAL HIGH (ref <150)
VLDL: 31 mg/dL (ref 0–40)

## 2020-07-01 LAB — BASIC METABOLIC PANEL
Anion gap: 10 (ref 5–15)
BUN: 18 mg/dL (ref 6–20)
CO2: 21 mmol/L — ABNORMAL LOW (ref 22–32)
Calcium: 8.7 mg/dL — ABNORMAL LOW (ref 8.9–10.3)
Chloride: 106 mmol/L (ref 98–111)
Creatinine, Ser: 0.59 mg/dL (ref 0.44–1.00)
GFR, Estimated: >60 mL/min (ref >60)
Glucose, Bld: 110 mg/dL — ABNORMAL HIGH (ref 70–99)
Potassium: 3.8 mmol/L (ref 3.5–5.1)
Sodium: 137 mmol/L (ref 135–145)

## 2020-07-01 NOTE — Patient Instructions (Signed)
Medication Instructions:  Your physician recommends that you continue on your current medications as directed. Please refer to the Current Medication list given to you today.  *If you need a refill on your cardiac medications before your next appointment, please call your pharmacy*   Lab Work: Your physician recommends that you return for lab work in: Today   If you have labs (blood work) drawn today and your tests are completely normal, you will receive your results only by: MyChart Message (if you have MyChart) OR A paper copy in the mail If you have any lab test that is abnormal or we need to change your treatment, we will call you to review the results.   Testing/Procedures: NONE    Follow-Up: At [facility], you and your health needs are our priority.  As part of our continuing mission to provide you with exceptional heart care, we have created designated Provider Care Teams.  These Care Teams include your primary Cardiologist (physician) and Advanced Practice Providers (APPs -  Physician Assistants and Nurse Practitioners) who all work together to provide you with the care you need, when you need it.  We recommend signing up for the patient portal called "MyChart".  Sign up information is provided on this After Visit Summary.  MyChart is used to connect with patients for Virtual Visits (Telemedicine).  Patients are able to view lab/test results, encounter notes, upcoming appointments, etc.  Non-urgent messages can be sent to your provider as well.   To learn more about what you can do with MyChart, go to [email or web address].    Your next appointment:   6 week(s)  The format for your next appointment:   In Person  Provider:   Paula Ross, MD or Brittany Strader, PA-C   Other Instructions Thank you for choosing [facility]!    

## 2020-07-02 ENCOUNTER — Telehealth: Payer: Self-pay

## 2020-07-02 MED ORDER — AMLODIPINE BESYLATE 2.5 MG PO TABS: 2.5000 mg | ORAL_TABLET | Freq: Every day | ORAL | 3 refills | Status: DC

## 2020-07-02 NOTE — Telephone Encounter (Signed)
-----   Message from Fay Records, MD sent at 07/02/2020  2:31 PM EDT ----- Electrolytes are OK    Potassium is 3.8  Would keep on same meds   Add amlodipine 2.5 mg to other meds for better BP control  Lipids:  LDL is excellent   74  HDL 47   Trig are a little high at 156   Watch carbs and sugars

## 2020-07-02 NOTE — Telephone Encounter (Signed)
Patient contacted and verbalized understanding. Patient had no questions or concerns at this time. Amlodipine 2.5 mg tablets sent to Pharmacy.

## 2020-08-05 DIAGNOSIS — M545 Low back pain, unspecified: Secondary | ICD-10-CM | POA: Diagnosis not present

## 2020-08-05 DIAGNOSIS — I10 Essential (primary) hypertension: Secondary | ICD-10-CM | POA: Diagnosis not present

## 2020-08-05 DIAGNOSIS — E876 Hypokalemia: Secondary | ICD-10-CM | POA: Diagnosis not present

## 2020-08-05 DIAGNOSIS — J3081 Allergic rhinitis due to animal (cat) (dog) hair and dander: Secondary | ICD-10-CM | POA: Diagnosis not present

## 2020-08-05 DIAGNOSIS — Z6841 Body Mass Index (BMI) 40.0 and over, adult: Secondary | ICD-10-CM | POA: Diagnosis not present

## 2020-08-11 NOTE — Progress Notes (Deleted)
Cardiology Office Note    Date:  08/11/2020   ID:  Marie Mills, DOB 1970/09/16, MRN [id number]  PCP:  Neale Burly, MD  Cardiologist: Dorris Carnes, MD    No chief complaint on file.   History of Present Illness:    Marie Mills is a 50 y.o. female with past medical history of HTN, obesity and GERD who presents to the office today for 6-week follow-up.  She was last examined by Dr. Harrington Challenger on 07/01/2020 for follow-up from a recent ED visit for chest pain. She had reported a sharp, cramping sensation which was nonpositional and was not associated with exertion. Troponin values had been negative and her EKG was without acute changes during ED evaluation. On examination, her pain was reproducible with pushing on her ribs and it was overall felt her symptoms were musculoskeletal. It was recommended she try NSAID's and she was started on Amlodipine 2.5mg  daily for BP control while continuing on Losartan and Toprol-XL.   - Taking Lisinopril or Losartan?  Past Medical History:  Diagnosis Date  . BV (bacterial vaginosis) 08/29/2013  . Contraceptive management 03/21/2013  . GERD (gastroesophageal reflux disease)   . Hypertension   . Obesity   . Vaginal irritation 08/29/2013    Past Surgical History:  Procedure Laterality Date  . CHOLECYSTECTOMY      Current Medications: Outpatient Medications Prior to Visit  Medication Sig Dispense Refill  . amLODipine (NORVASC) 2.5 MG tablet Take 1 tablet (2.5 mg total) by mouth daily. 90 tablet 3  . aspirin EC 81 MG tablet Take 81 mg by mouth daily. Swallow whole.    . ibuprofen (ADVIL) 800 MG tablet Take 800 mg by mouth 3 (three) times daily.    Marland Kitchen lisinopril-hydrochlorothiazide (ZESTORETIC) 20-12.5 MG tablet Take 1 tablet by mouth daily. 30 tablet 3  . losartan (COZAAR) 50 MG tablet Take 50 mg by mouth daily.    . methocarbamol (ROBAXIN) 500 MG tablet Take 500 mg by mouth at bedtime.    . metoprolol succinate (TOPROL-XL) 25 MG 24 hr tablet Take  25 mg by mouth daily.    Marland Kitchen omeprazole (PRILOSEC) 40 MG capsule TK 1 C PO QD (Patient taking differently: Take 40 mg by mouth daily.) 30 capsule 6  . potassium chloride SA (KLOR-CON) 20 MEQ tablet Take 1 tablet (20 mEq total) by mouth once for 1 dose. 30 tablet 0  . TRI-SPRINTEC 0.18/0.215/0.25 MG-35 MCG tablet TAKE 1 TABLET BY MOUTH DAILY (Patient taking differently: Take 1 tablet by mouth daily.) 84 tablet 4   No facility-administered medications prior to visit.     Allergies:   Penicillins, Codeine, and Sulfa antibiotics   Social History   Socioeconomic History  . Marital status: Divorced    Spouse name: Not on file  . Number of children: 3  . Years of education: Not on file  . Highest education level: Not on file  Occupational History  . Not on file  Tobacco Use  . Smoking status: Never Smoker  . Smokeless tobacco: Never Used  Vaping Use  . Vaping Use: Never used  Substance and Sexual Activity  . Alcohol use: No  . Drug use: No  . Sexual activity: Yes    Birth control/protection: Pill  Other Topics Concern  . Not on file  Social History Narrative  . Not on file   Social Determinants of Health   Financial Resource Strain: Not on file  Food Insecurity: Not on file  Transportation Needs: Not on  file  Physical Activity: Not on file  Stress: Not on file  Social Connections: Not on file     Family History:  The patient's ***family history includes Asthma in her daughter and son; Cancer in her maternal aunt; Diabetes in her brother, father, mother, sister, and sister; Hyperlipidemia in her father and mother; Hypertension in her brother, father, mother, sister, sister, and sister; Seizures in her father.   Review of Systems:    Please see the history of present illness.     All other systems reviewed and are otherwise negative except as noted above.   Physical Exam:    VS:  There were no vitals taken for this visit.   General: Well developed, well nourished,female  appearing in no acute distress. Head: Normocephalic, atraumatic. Neck: No carotid bruits. JVD not elevated.  Lungs: Respirations regular and unlabored, without wheezes or rales.  Heart: ***Regular rate and rhythm. No S3 or S4.  No murmur, no rubs, or gallops appreciated. Abdomen: Appears non-distended. No obvious abdominal masses. Msk:  Strength and tone appear normal for age. No obvious joint deformities or effusions. Extremities: No clubbing or cyanosis. No edema.  Distal pedal pulses are 2+ bilaterally. Neuro: Alert and oriented X 3. Moves all extremities spontaneously. No focal deficits noted. Psych:  Responds to questions appropriately with a normal affect. Skin: No rashes or lesions noted  Wt Readings from Last 3 Encounters:  07/01/20 268 lb (121.6 kg)  06/29/20 250 lb (113.4 kg)  09/06/19 246 lb 12.8 oz (111.9 kg)        Studies/Labs Reviewed:   EKG:  EKG is*** ordered today.  The ekg ordered today demonstrates ***  Recent Labs: 06/29/2020: Hemoglobin 12.8; Platelets 253 07/01/2020: BUN 18; Creatinine, Ser 0.59; Potassium 3.8; Sodium 137   Lipid Panel    Component Value Date/Time   CHOL 152 07/01/2020 1101   TRIG 156 (H) 07/01/2020 1101   HDL 47 07/01/2020 1101   CHOLHDL 3.2 07/01/2020 1101   VLDL 31 07/01/2020 1101   LDLCALC 74 07/01/2020 1101    Additional studies/ records that were reviewed today include:   CXR: 06/2020 FINDINGS: Normal heart size, mediastinal contours, and pulmonary vascularity.  Lungs clear.  No infiltrate, pleural effusion, or pneumothorax.  Old healed fracture of the lateral LEFT sixth rib.  IMPRESSION: No acute abnormalities.  Assessment:    No diagnosis found.   Plan:   In order of problems listed above:  1. ***    Shared Decision Making/Informed Consent:   {Are you ordering a CV Procedure (e.g. stress test, cath, DCCV, TEE, etc)?   Press F2        :[id number]}    Medication Adjustments/Labs and Tests  Ordered: Current medicines are reviewed at length with the patient today.  Concerns regarding medicines are outlined above.  Medication changes, Labs and Tests ordered today are listed in the Patient Instructions below. There are no Patient Instructions on file for this visit.   Signed, Erma Heritage, PA-C  08/11/2020 1:36 PM    [facility] [address] [place], [place] [address] Phone: [phone] Fax: [phone]

## 2020-08-12 ENCOUNTER — Ambulatory Visit: Payer: Medicaid Other | Admitting: Student

## 2020-09-22 DIAGNOSIS — I10 Essential (primary) hypertension: Secondary | ICD-10-CM | POA: Diagnosis not present

## 2020-09-22 DIAGNOSIS — Z6841 Body Mass Index (BMI) 40.0 and over, adult: Secondary | ICD-10-CM | POA: Diagnosis not present

## 2020-09-22 DIAGNOSIS — M5442 Lumbago with sciatica, left side: Secondary | ICD-10-CM | POA: Diagnosis not present

## 2020-10-20 ENCOUNTER — Encounter (HOSPITAL_COMMUNITY): Payer: Self-pay

## 2020-10-20 ENCOUNTER — Ambulatory Visit (HOSPITAL_COMMUNITY): Payer: Medicaid Other | Attending: Neurosurgery

## 2020-10-20 ENCOUNTER — Other Ambulatory Visit: Payer: Self-pay

## 2020-10-20 DIAGNOSIS — M5442 Lumbago with sciatica, left side: Secondary | ICD-10-CM | POA: Insufficient documentation

## 2020-10-20 DIAGNOSIS — R29898 Other symptoms and signs involving the musculoskeletal system: Secondary | ICD-10-CM | POA: Diagnosis present

## 2020-10-20 DIAGNOSIS — M6281 Muscle weakness (generalized): Secondary | ICD-10-CM | POA: Insufficient documentation

## 2020-10-20 NOTE — Therapy (Signed)
[place], [place], [address] Phone: [phone]   Fax:  [phone]  Physical Therapy Evaluation  Patient Details  Name: Ceci Taliaferro MRN: [id number] Date of Birth: 12/07/1970 Referring Provider (PT): Ashok Pall, MD   Encounter Date: 10/20/2020   PT End of Session - 10/20/20 1645     Visit Number 1    Number of Visits 12    Date for PT Re-Evaluation 12/01/20    Authorization Type [place] Medicaid [employer]    PT Start Time 8563    PT Stop Time 1730    PT Time Calculation (min) 45 min    Activity Tolerance Patient tolerated treatment well;No increased pain    Behavior During Therapy [facility] for tasks assessed/performed             Past Medical History:  Diagnosis Date   BV (bacterial vaginosis) 08/29/2013   Contraceptive management 03/21/2013   GERD (gastroesophageal reflux disease)    Hypertension    Obesity    Vaginal irritation 08/29/2013    Past Surgical History:  Procedure Laterality Date   CHOLECYSTECTOMY      There were no vitals filed for this visit.    Subjective Assessment - 10/20/20 1649     Subjective Pt notes over a year of back pain with LLE referred pain along posterior thigh to heel.  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 Pt notes she was prescribed oral steroids which helped reduce pain siginficantly. Pt notes she has episodes of acute on chronic pain with this condition and notes issues when transferring heavy patients which sets off the back pain    How long can you sit comfortably? 30 minutes    Diagnostic tests MRI about a year ago with conflicting information    Currently in Pain? Yes    Pain Score 3     Pain Location Buttocks    Pain Orientation Left    Pain Descriptors / Indicators Sore;Tiring    Pain Type Chronic pain    Pain Radiating Towards LLE into heel    Pain Onset More than a month ago    Pain Frequency  Intermittent    Aggravating Factors  lifting, bending, twisting    Pain Relieving Factors sitting with feet propped                [facility] PT Assessment - 10/20/20 0001       Assessment   Medical Diagnosis lumbago w/sciatica, left side    Referring Provider (PT) Ashok Pall, MD      Balance Screen   Has the patient fallen in the past 6 months No    Has the patient had a decrease in activity level because of a fear of falling?  No    Is the patient reluctant to leave their home because of a fear of falling?  No      Prior Function   Level of Independence Independent    Vocation Full time employment    Vocation Requirements CNA at SNF    Leisure sleep      ROM / Strength   AROM / PROM / Strength AROM;Strength      AROM   AROM Assessment Site Lumbar    Lumbar Flexion 20% limited with pronounced left lateral shift    Lumbar Extension 10% limited    Lumbar - Right Side Bend WNL    Lumbar - Left Side  Bend WNL    Lumbar - Right Rotation WNL    Lumbar - Left Rotation WNL      Strength   Strength Assessment Site Lumbar    Lumbar Flexion 2+/5    Lumbar Extension 2+/5      Palpation   Spinal mobility decreased PIVM PA    Palpation comment no spasm      Special Tests    Special Tests Lumbar    Lumbar Tests Slump Test;Straight Leg Raise      Slump test   Findings Positive    Side Left      Straight Leg Raise   Findings Positive    Side  Left      Ambulation/Gait   Ambulation/Gait Yes    Ambulation/Gait Assistance 7: Independent                        Objective measurements completed on examination: See above findings.       [facility] Adult PT Treatment/Exercise - 10/20/20 0001       Exercises   Exercises Lumbar      Lumbar Exercises: Stretches   Prone on Elbows Stretch 5 reps;10 seconds    Press Ups 10 reps;5 seconds                    PT Education - 10/20/20 1739     Education Details education regarding assessment findinds  and proceeding with extension-bias movements and self-assessment for centralization of LLE symptoms. Sitting with lumbar support    Person(s) Educated Patient    Methods Explanation;Demonstration;Handout    Comprehension Verbalized understanding;Returned demonstration              PT Short Term Goals - 10/20/20 1746       PT SHORT TERM GOAL #1   Title Patient will be independent with HEP in order to improve functional outcomes.    Time 3    Period Weeks    Status New    Target Date 11/10/20      PT SHORT TERM GOAL #2   Title Patient will report at least 25% improvement in symptoms for improved quality of life.    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    Time 6    Period Weeks    Status New    Target Date 12/01/20      PT LONG TERM GOAL #2   Title Patient will demo proper lifting form 5/5 observations to improve safety with job performance    Time 6    Period Weeks    Status New    Target Date 12/01/20  PT LONG TERM GOAL #3   Title Demo 3/5 trunk strength to improve proximal stability    Baseline 2+/5    Time 6    Period Weeks    Status New    Target Date 12/01/20                    Plan - 10/20/20 1741     Clinical Impression Statement Pt is 50 yo lady presenting with onset of LBP and LLE pain x 1 year with episodic flare-ups of increased pain and difficulty with walking.  Pt  demonstrates a positive SLR and Sitting Slump Test on LLE and notes increase in referred pain with flexion-biased positions and movements.  Demonstrates multiple body mechanic faults with functional movements and generalized weakness throughout her trunk.  Pt would benefit from PT services to train/instruct in techniuqes to centralize LLE pain and progress with physical activity to improve body/lifting mechanics to improve her job performance and reduce risk for reinjury to low back    Personal Factors and Comorbidities Comorbidity 1;Time since onset of injury/illness/exacerbation;Past/Current Experience    Comorbidities PMH    Examination-Activity Limitations Bend;Caring for Others;Carry;Lift;Squat;Locomotion Level    Examination-Participation Restrictions Cleaning;Community Activity;Yard Work;Occupation    Stability/Clinical Decision Making Stable/Uncomplicated    Clinical Decision Making Low    Rehab Potential Good    PT Frequency 2x / week    PT Duration 6 weeks    PT Treatment/Interventions ADLs/Self Care Home Management;Gait training;Stair training;Functional mobility training;Therapeutic activities;Therapeutic exercise;Balance training;Patient/family education;Neuromuscular re-education;Manual techniques;Passive range of motion;Dry needling;Taping;Spinal Manipulations;Joint Manipulations    PT Next Visit Plan assess for success of extension-bias, lifting mechanics    PT Home Exercise Plan sitting with lumbar support, prone on elbows, half-range prone pressups    Consulted and Agree with Plan of Care Patient             Patient will benefit from skilled therapeutic intervention in order to improve the following deficits and impairments:  Decreased range of motion, Decreased strength, Impaired perceived functional ability, Impaired flexibility, Postural dysfunction, Improper body mechanics, Pain, Obesity  Visit Diagnosis: Left-sided low back pain with left-sided sciatica, unspecified  chronicity  Muscle weakness (generalized)  Other symptoms and signs involving the musculoskeletal system     Problem List Patient Active Problem List   Diagnosis Date Noted   Weight gain 07/04/2019   Body mass index 39.0-39.9, adult 05/28/2019   Body mass index 37.0-37.9, adult 04/08/2019   Body mass index 38.0-38.9, adult 02/04/2019   Body mass index 40.0-44.9, adult ([place]) 11/01/2018   Encounter for well woman exam with routine gynecological exam 10/08/2018   Weight loss counseling, encounter for 10/08/2018   Body mass index 45.0-49.9, adult ([place]) 10/08/2018   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 present 05/30/2016   Surveillance for birth control, oral contraceptives 05/30/2016   Family planning 05/30/2016   Screening for colorectal cancer 05/30/2016   Vaginal irritation 08/29/2013   BV (bacterial vaginosis) 08/29/2013   Essential hypertension 03/21/2013   Contraceptive management 03/21/2013   5:51 PM, 10/20/20 M. Sherlyn Lees, PT, DPT Physical Therapist- [place] Office Number: [phone]   [place] [address] [place], [place], [address] Phone: [phone]   Fax:  [phone]  Name: Marrian Bells MRN: [id number] Date of Birth: 1970/04/27

## 2020-10-20 NOTE — Patient Instructions (Signed)
Access Code: [facility] URL: [email or web address] Date: 10/20/2020 Prepared by: Sherlyn Lees  Exercises Prone Press Up On Elbows - 1 x daily - 7 x weekly - 3 sets - 10 reps Prone Press Up - 1 x daily - 7 x weekly - 3 sets - 10 reps

## 2020-10-22 ENCOUNTER — Encounter (HOSPITAL_COMMUNITY): Payer: Self-pay | Admitting: Physical Therapy

## 2020-10-22 ENCOUNTER — Ambulatory Visit (HOSPITAL_COMMUNITY): Payer: Medicaid Other | Admitting: Physical Therapy

## 2020-10-22 ENCOUNTER — Other Ambulatory Visit: Payer: Self-pay

## 2020-10-22 DIAGNOSIS — M5442 Lumbago with sciatica, left side: Secondary | ICD-10-CM | POA: Diagnosis not present

## 2020-10-22 DIAGNOSIS — M6281 Muscle weakness (generalized): Secondary | ICD-10-CM

## 2020-10-22 NOTE — Therapy (Signed)
[place], [place], [address] Phone: [phone]   Fax:  [phone]  Physical Therapy Treatment  Patient Details  Name: Marie Mills MRN: [id number] Date of Birth: 10-Feb-1971 Referring Provider (PT): Ashok Pall, MD   Encounter Date: 10/22/2020   PT End of Session - 10/22/20 1452     Visit Number 2    Number of Visits 12    Date for PT Re-Evaluation 12/01/20    Authorization Type  Medicaid [employer]    PT Start Time 4431   late to check in   PT Stop Time 1525    PT Time Calculation (min) 32 min    Activity Tolerance Patient tolerated treatment well;No increased pain    Behavior During Therapy [facility] for tasks assessed/performed             Past Medical History:  Diagnosis Date   BV (bacterial vaginosis) 08/29/2013   Contraceptive management 03/21/2013   GERD (gastroesophageal reflux disease)    Hypertension    Obesity    Vaginal irritation 08/29/2013    Past Surgical History:  Procedure Laterality Date   CHOLECYSTECTOMY      There were no vitals filed for this visit.   Subjective Assessment - 10/22/20 1458     Subjective States that she has done her exercise yesterday but she works third shift and it is just hard to do her exercises after work. States that she doesn't have a lot of symptoms right now. States that she has about 1/10 in her back with walking and it is more tension.    How long can you sit comfortably? 30 minutes    Diagnostic tests MRI about a year ago with conflicting information    Currently in Pain? Yes    Pain Score 1     Pain Location Back    Pain Orientation Left    Pain Descriptors / Indicators Other (Comment)   tension   Pain Onset More than a month ago                [facility] PT Assessment - 10/22/20 0001       Assessment   Medical Diagnosis lumbago w/sciatica, left side    Referring Provider (PT) Ashok Pall, MD                            [facility] Adult PT Treatment/Exercise - 10/22/20 0001       Lumbar Exercises: Stretches   Prone on Elbows Stretch 10 seconds   2 minutes x2 --> then progressed to more extension     Lumbar Exercises: Standing   Other Standing Lumbar Exercises lumbar extension at wall standing 3x10 5" holds      Lumbar Exercises: Prone   Straight Leg Raise 2 seconds;10 reps   both 3 sets                   PT Education - 10/22/20 1500     Education Details on importance of frequency of exercises for lumbar extension biased to work. on how to perform exercise at work.    Person(s) Educated Patient    Methods Explanation    Comprehension Verbalized understanding              PT Short Term Goals - 10/20/20 1746       PT SHORT TERM GOAL #1   Title Patient will be independent with HEP  in order to improve functional outcomes.    Time 3    Period Weeks    Status New    Target Date 11/10/20      PT SHORT TERM GOAL #2   Title Patient will report at least 25% improvement in symptoms for improved quality of life.    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    Time 6    Period Weeks    Status New    Target Date 12/01/20      PT LONG TERM GOAL #2   Title Patient will demo proper lifting form 5/5 observations to improve safety with job performance    Time 6    Period Weeks    Status New     Target Date 12/01/20      PT LONG TERM GOAL #3   Title Demo 3/5 trunk strength to improve proximal stability    Baseline 2+/5    Time 6    Period Weeks    Status New    Target Date 12/01/20                   Plan - 10/22/20 1513     Clinical Impression Statement Continued with extension biased on this date. Slight improvement in symptoms during standing extension but little carryover noted after exercise. Transitioned to prone exercise and this was tolerated moderately well with reduction in lumbar symptoms noted afterwards. Fatigue in arms limited patient most with press ups. Added glute strengthening to HEP today. Session limited  secondary late check in. Will continue to progress strength and assess lumbar extension bias next session. Reduced symptoms in back noted end of session.    Personal Factors and Comorbidities Comorbidity 1;Time since onset of injury/illness/exacerbation;Past/Current Experience    Comorbidities PMH    Examination-Activity Limitations Bend;Caring for Others;Carry;Lift;Squat;Locomotion Level    Examination-Participation Restrictions Cleaning;Community Activity;Yard Work;Occupation    Stability/Clinical Decision Making Stable/Uncomplicated    Rehab Potential Good    PT Frequency 2x / week    PT Duration 6 weeks    PT Treatment/Interventions ADLs/Self Care Home Management;Gait training;Stair training;Functional mobility training;Therapeutic activities;Therapeutic exercise;Balance training;Patient/family education;Neuromuscular re-education;Manual techniques;Passive range of motion;Dry needling;Taping;Spinal Manipulations;Joint Manipulations    PT Next Visit Plan assess for success of extension-bias, lifting mechanics    PT Home Exercise Plan sitting with lumbar support, prone on elbows, half-range prone pressups; 7/7 full press upr, standing lumbar extension, hip extension    Consulted and Agree with Plan of Care Patient             Patient will  benefit from skilled therapeutic intervention in order to improve the following deficits and impairments:  Decreased range  of motion, Decreased strength, Impaired perceived functional ability, Impaired flexibility, Postural dysfunction, Improper body mechanics, Pain, Obesity  Visit Diagnosis: Left-sided low back pain with left-sided sciatica, unspecified chronicity  Muscle weakness (generalized)     Problem List Patient Active Problem List   Diagnosis Date Noted   Weight gain 07/04/2019   Body mass index 39.0-39.9, adult 05/28/2019   Body mass index 37.0-37.9, adult 04/08/2019   Body mass index 38.0-38.9, adult 02/04/2019   Body mass index 40.0-44.9, adult ([place]) 11/01/2018   Encounter for well woman exam with routine gynecological exam 10/08/2018   Weight loss counseling, encounter for 10/08/2018   Body mass index 45.0-49.9, adult ([place]) 10/08/2018   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 present 05/30/2016   Surveillance for birth control, oral contraceptives 05/30/2016   Family planning 05/30/2016   Screening for colorectal cancer 05/30/2016   Vaginal irritation 08/29/2013   BV (bacterial vaginosis) 08/29/2013   Essential hypertension 03/21/2013   Contraceptive management 03/21/2013   3:26 PM, 10/22/20 Jerene Pitch, DPT Physical Therapy with [facility]  [phone] office   [place], [place], [address] Phone: [phone]   Fax:  [phone]  Name: Marie Mills MRN: [id number] Date of Birth: 08-04-1970

## 2020-10-27 ENCOUNTER — Encounter (HOSPITAL_COMMUNITY): Payer: Medicaid Other | Admitting: Physical Therapy

## 2020-10-29 ENCOUNTER — Ambulatory Visit (HOSPITAL_COMMUNITY): Payer: Medicaid Other

## 2020-10-29 ENCOUNTER — Telehealth (HOSPITAL_COMMUNITY): Payer: Self-pay

## 2020-10-29 NOTE — Telephone Encounter (Signed)
No show, called and left message concerning missed apt today.  Reminded next apt date and time with contact information included if needs to cancel/reschedule future apts.   Ihor Austin, LPTA/CLT; Delana Meyer [phone]

## 2020-11-04 ENCOUNTER — Ambulatory Visit (HOSPITAL_COMMUNITY): Payer: Medicaid Other | Admitting: Physical Therapy

## 2020-11-05 ENCOUNTER — Other Ambulatory Visit: Payer: Medicaid Other | Admitting: Adult Health

## 2020-11-06 ENCOUNTER — Encounter (HOSPITAL_COMMUNITY): Payer: Medicaid Other

## 2020-11-17 ENCOUNTER — Encounter (HOSPITAL_COMMUNITY): Payer: Medicaid Other

## 2020-11-19 ENCOUNTER — Encounter (HOSPITAL_COMMUNITY): Payer: Medicaid Other | Admitting: Physical Therapy

## 2020-11-19 ENCOUNTER — Telehealth (HOSPITAL_COMMUNITY): Payer: Self-pay | Admitting: Physical Therapy

## 2020-11-19 NOTE — Telephone Encounter (Signed)
No Show #3. Called and left VM about missed apt and about canceling and discharging all apts secondary to no show/cancellation policy.   4:33 PM, 11/19/20 Jerene Pitch, DPT Physical Therapy with [facility]  [phone] office

## 2020-11-24 ENCOUNTER — Encounter (HOSPITAL_COMMUNITY): Payer: Medicaid Other | Admitting: Physical Therapy

## 2020-11-26 ENCOUNTER — Encounter (HOSPITAL_COMMUNITY): Payer: Medicaid Other

## 2020-12-10 DIAGNOSIS — Z6841 Body Mass Index (BMI) 40.0 and over, adult: Secondary | ICD-10-CM | POA: Diagnosis not present

## 2020-12-10 DIAGNOSIS — Z0000 Encounter for general adult medical examination without abnormal findings: Secondary | ICD-10-CM | POA: Diagnosis not present

## 2020-12-18 ENCOUNTER — Other Ambulatory Visit: Payer: Medicaid Other | Admitting: Adult Health

## 2021-01-01 ENCOUNTER — Other Ambulatory Visit: Payer: Self-pay | Admitting: Adult Health

## 2021-01-11 DIAGNOSIS — H5213 Myopia, bilateral: Secondary | ICD-10-CM | POA: Diagnosis not present

## 2021-02-03 ENCOUNTER — Ambulatory Visit (INDEPENDENT_AMBULATORY_CARE_PROVIDER_SITE_OTHER): Payer: Medicaid Other | Admitting: Adult Health

## 2021-02-03 ENCOUNTER — Encounter: Payer: Self-pay | Admitting: Adult Health

## 2021-02-03 ENCOUNTER — Other Ambulatory Visit: Payer: Self-pay

## 2021-02-03 ENCOUNTER — Other Ambulatory Visit (HOSPITAL_COMMUNITY)
Admission: RE | Admit: 2021-02-03 | Discharge: 2021-02-03 | Disposition: A | Discharge status: Home / Self Care | Payer: Medicaid Other | Source: Ambulatory Visit | Attending: Adult Health | Admitting: Adult Health

## 2021-02-03 VITALS — BP 139/84 | HR 85 | Ht 66.0 in | Wt 273.0 lb

## 2021-02-03 DIAGNOSIS — F419 Anxiety disorder, unspecified: Secondary | ICD-10-CM | POA: Diagnosis not present

## 2021-02-03 DIAGNOSIS — Z01419 Encounter for gynecological examination (general) (routine) without abnormal findings: Secondary | ICD-10-CM

## 2021-02-03 DIAGNOSIS — Z0000 Encounter for general adult medical examination without abnormal findings: Secondary | ICD-10-CM

## 2021-02-03 DIAGNOSIS — Z3041 Encounter for surveillance of contraceptive pills: Secondary | ICD-10-CM

## 2021-02-03 DIAGNOSIS — Z1211 Encounter for screening for malignant neoplasm of colon: Secondary | ICD-10-CM | POA: Diagnosis not present

## 2021-02-03 DIAGNOSIS — Z1231 Encounter for screening mammogram for malignant neoplasm of breast: Secondary | ICD-10-CM

## 2021-02-03 DIAGNOSIS — Z713 Dietary counseling and surveillance: Secondary | ICD-10-CM

## 2021-02-03 DIAGNOSIS — Z6841 Body Mass Index (BMI) 40.0 and over, adult: Secondary | ICD-10-CM

## 2021-02-03 DIAGNOSIS — Z1212 Encounter for screening for malignant neoplasm of rectum: Secondary | ICD-10-CM

## 2021-02-03 DIAGNOSIS — F32A Depression, unspecified: Secondary | ICD-10-CM

## 2021-02-03 LAB — HEMOCCULT GUIAC POC 1CARD (OFFICE): Fecal Occult Blood, POC: NEGATIVE

## 2021-02-03 MED ORDER — NORGESTIM-ETH ESTRAD TRIPHASIC 0.18/0.215/0.25 MG-35 MCG PO TABS: 1.0000 | ORAL_TABLET | Freq: Every day | ORAL | 4 refills | Status: DC

## 2021-02-03 MED ORDER — PHENTERMINE HCL 37.5 MG PO TABS: 37.5000 mg | ORAL_TABLET | Freq: Every day | ORAL | 0 refills | Status: DC

## 2021-02-03 MED ORDER — SERTRALINE HCL 50 MG PO TABS: 50.0000 mg | ORAL_TABLET | Freq: Every day | ORAL | 6 refills | Status: DC

## 2021-02-03 NOTE — Progress Notes (Signed)
Patient ID: Marie Mills, female   DOB: 1970-04-24, 50 y.o.   MRN: [id number] History of Present Illness: Marie Mills is a 50 year old white female,separated, [id number], in for a well woman gyn exam and pap. She wants to try adipex again. PCP is Dr Sherrie Sport   Current Medications, Allergies, Past Medical History, Past Surgical History, Family History and Social History were reviewed in [facility] record.     Review of Systems:  Patient denies any headaches, hearing loss, fatigue, blurred vision, shortness of breath, chest pain, abdominal pain, problems with bowel movements, urination, or intercourse. No joint pain or mood swings. Has chronic back pain   Physical Exam:BP 139/84 (BP Location: Left Arm, Patient Position: Sitting, Cuff Size: Large)   Pulse 85   Ht 5\' 6"  (1.676 m)   Wt 273 lb (123.8 kg)   LMP 01/30/2021 (Approximate)   BMI 44.06 kg/m   General:  Well developed, well nourished, no acute distress Skin:  Warm and dry,has numerous tattoos  Neck:  Midline trachea, normal thyroid, good ROM, no lymphadenopathy Lungs; Clear to auscultation bilaterally Breast:  No dominant palpable mass, retraction, or nipple discharge Cardiovascular: Regular rate and rhythm Abdomen:  Soft, non tender, no hepatosplenomegaly Pelvic:  External genitalia is normal in appearance, no lesions.  The vagina is normal in appearance. Urethra has no lesions or masses. The cervix is bulbous. Pap with HR HPV genotyping performed. Uterus is felt to be normal size, shape, and contour.  No adnexal masses or tenderness noted.Bladder is non tender, no masses felt. Rectal: Good sphincter tone, no polyps, or hemorrhoids felt.  Hemoccult negative. Extremities/musculoskeletal:  No swelling or varicosities noted, no clubbing or cyanosis Psych:  No mood changes, alert and cooperative,seems happy AA is 0 Fall risk is low Depression screen [facility] 2/9 02/03/2021 10/08/2018 05/31/2017  Decreased Interest 1 0 0   Down, Depressed, Hopeless 1 0 0  PHQ - 2 Score 2 0 0  Altered sleeping 2 - -  Tired, decreased energy 3 - -  Change in appetite 2 - -  Feeling bad or failure about yourself  3 - -  Trouble concentrating 0 - -  Moving slowly or fidgety/restless 0 - -  Suicidal thoughts 0 - -  PHQ-9 Score 12 - -    GAD 7 : Generalized Anxiety Score 02/03/2021  Nervous, Anxious, on Edge 3  Control/stop worrying 2  Worry too much - different things 3  Trouble relaxing 2  Restless 0  Easily annoyed or irritable 1  Afraid - awful might happen 0  Total GAD 7 Score 11      Upstream - 02/03/21 1453       Pregnancy Intention Screening   Does the patient want to become pregnant in the next year? No    Does the patient's partner want to become pregnant in the next year? No    Would the patient like to discuss contraceptive options today? No      Contraception Wrap Up   Current Method Oral Contraceptive    End Method Oral Contraceptive    Contraception Counseling Provided No            Examination chaperoned by Levy Pupa LPN   Impression and Plan: 1. Routine general medical examination at a health care facility Pap sent  - Cytology - PAP( [place])  2. Encounter for gynecological examination with Papanicolaou smear of cervix Pap sent  Physical in 1 year Labs with PCP  3. Encounter  for surveillance of contraceptive pills Will refill tri sprintec  Meds ordered this encounter  Medications   Norgestimate-Ethinyl Estradiol Triphasic (TRI-SPRINTEC) 0.18/0.215/0.25 MG-35 MCG tablet    Sig: Take 1 tablet by mouth daily.    Dispense:  84 tablet    Refill:  4    Order Specific Question:   Supervising Provider    Answer:   Tania Ade H [[id number]]   sertraline (ZOLOFT) 50 MG tablet    Sig: Take 1 tablet (50 mg total) by mouth daily.    Dispense:  30 tablet    Refill:  6    Order Specific Question:   Supervising Provider    Answer:   Tania Ade H [[id number]]   phentermine (ADIPEX-P) 37.5  MG tablet    Sig: Take 1 tablet (37.5 mg total) by mouth daily before breakfast. Take 1 daily    Dispense:  30 tablet    Refill:  0    Order Specific Question:   Supervising Provider    Answer:   EURE, LUTHER H [[id number]]     4. Screening mammogram for breast cancer Mammogram shceuled [facility] her at [facility] 02/15/21 at 7:45 am - MM 3D SCREEN BREAST BILATERAL; Future  5. Screening for colorectal cancer Referred to [facility] for colonoscopy - Ambulatory referral to Gastroenterology  6. Weight loss counseling, encounter for Will rx adipex Decrease portions Walk more and increase water  Follow up in 4 weeks for weight and BP check   7. Body mass index 40.0-44.9, adult ([place])   8. Anxiety and depression Will rx zoloft 50 mg daily Will follow up in 4 weeks   9. Encounter for screening fecal occult blood testing  - POCT occult blood stool

## 2021-02-05 LAB — CYTOLOGY - PAP
Adequacy: ABSENT
Comment: NEGATIVE
Diagnosis: NEGATIVE
High risk HPV: NEGATIVE

## 2021-02-08 ENCOUNTER — Other Ambulatory Visit: Payer: Self-pay | Admitting: Adult Health

## 2021-02-08 ENCOUNTER — Telehealth: Payer: Self-pay | Admitting: *Deleted

## 2021-02-08 ENCOUNTER — Encounter: Payer: Self-pay | Admitting: Internal Medicine

## 2021-02-08 MED ORDER — FLUCONAZOLE 150 MG PO TABS: ORAL_TABLET | ORAL | 1 refills | Status: DC

## 2021-02-08 NOTE — Progress Notes (Signed)
+  yeast on pap will rx diflucan  

## 2021-02-08 NOTE — Telephone Encounter (Signed)
Pt aware pap was negative for malignancy and HPV but + for yeast. Diflucan was sent to pharmacy. Pt voiced understanding. [place]

## 2021-02-15 ENCOUNTER — Ambulatory Visit (HOSPITAL_COMMUNITY): Payer: Medicaid Other

## 2021-02-24 ENCOUNTER — Other Ambulatory Visit: Payer: Self-pay

## 2021-02-24 ENCOUNTER — Ambulatory Visit (HOSPITAL_COMMUNITY)
Admission: RE | Admit: 2021-02-24 | Discharge: 2021-02-24 | Disposition: A | Discharge status: Home / Self Care | Payer: Medicaid Other | Source: Ambulatory Visit | Attending: Adult Health | Admitting: Adult Health

## 2021-02-24 DIAGNOSIS — Z1231 Encounter for screening mammogram for malignant neoplasm of breast: Secondary | ICD-10-CM | POA: Insufficient documentation

## 2021-03-05 ENCOUNTER — Encounter: Payer: Self-pay | Admitting: Adult Health

## 2021-03-05 ENCOUNTER — Other Ambulatory Visit: Payer: Self-pay

## 2021-03-05 ENCOUNTER — Ambulatory Visit: Payer: Medicaid Other | Admitting: Adult Health

## 2021-03-05 VITALS — BP 146/82 | HR 71 | Ht 66.0 in | Wt 258.0 lb

## 2021-03-05 DIAGNOSIS — Z713 Dietary counseling and surveillance: Secondary | ICD-10-CM

## 2021-03-05 DIAGNOSIS — Z6841 Body Mass Index (BMI) 40.0 and over, adult: Secondary | ICD-10-CM | POA: Diagnosis not present

## 2021-03-05 MED ORDER — PHENTERMINE HCL 37.5 MG PO TABS: 37.5000 mg | ORAL_TABLET | Freq: Every day | ORAL | 0 refills | Status: DC

## 2021-03-05 NOTE — Progress Notes (Signed)
  Subjective:     Patient ID: Marie Mills, female   DOB: 09-09-1970, 50 y.o.   MRN: [id number]  HPI Marie Mills is a 50 year old white female,divorced, [id number] in for weight and BP check she started phentermine 02/03/21 and has lost weight and feels better. Lab Results  Component Value Date   DIAGPAP  02/03/2021    - Negative for intraepithelial lesion or malignancy (NILM)   HPV NOT DETECTED 05/30/2016   [place] Negative 02/03/2021   PCP is Dr Sherrie Sport.  Review of Systems +weight loss Feels better Legs not hurting  Reviewed past medical,surgical, social and family history. Reviewed medications and allergies.     Objective:   Physical Exam BP (!) 146/82 (BP Location: Left Arm, Patient Position: Sitting, Cuff Size: Normal)   Pulse 71   Ht 5\' 6"  (1.676 m)   Wt 258 lb (117 kg)   LMP 03/02/2021   BMI 41.64 kg/m     Skin warm and dry. Lungs: clear to ausculation bilaterally. Cardiovascular: regular rate and rhythm.  Has lost 15 lbs.  Depression screen [facility] 2/9 03/05/2021 02/03/2021 10/08/2018  Decreased Interest 0 1 0  Down, Depressed, Hopeless 0 1 0  PHQ - 2 Score 0 2 0  Altered sleeping - 2 -  Tired, decreased energy - 3 -  Change in appetite - 2 -  Feeling bad or failure about yourself  - 3 -  Trouble concentrating - 0 -  Moving slowly or fidgety/restless - 0 -  Suicidal thoughts - 0 -  PHQ-9 Score - 12 -     Upstream - 03/05/21 1017       Pregnancy Intention Screening   Does the patient want to become pregnant in the next year? No    Does the patient's partner want to become pregnant in the next year? No    Would the patient like to discuss contraceptive options today? No      Contraception Wrap Up   Current Method Oral Contraceptive    End Method Oral Contraceptive    Contraception Counseling Provided No             Assessment:     1. Weight loss counseling, encounter for Continue weight loss efforts Praise over 15 lb loss Will continue adipex Meds ordered  this encounter  Medications   phentermine (ADIPEX-P) 37.5 MG tablet    Sig: Take 1 tablet (37.5 mg total) by mouth daily before breakfast. Take 1 daily    Dispense:  30 tablet    Refill:  0    Order Specific Question:   Supervising Provider    Answer:   Elonda Husky, LUTHER H [[id number]]     2. Body mass index 40.0-44.9, adult (HCC)     Plan:    Follow up in 4 weeks for weight and BP check

## 2021-04-02 ENCOUNTER — Ambulatory Visit: Payer: Medicaid Other | Admitting: Adult Health

## 2021-04-02 ENCOUNTER — Other Ambulatory Visit: Payer: Self-pay

## 2021-04-02 ENCOUNTER — Encounter: Payer: Self-pay | Admitting: Adult Health

## 2021-04-02 VITALS — BP 139/80 | HR 86 | Ht 66.0 in | Wt 254.0 lb

## 2021-04-02 DIAGNOSIS — Z6841 Body Mass Index (BMI) 40.0 and over, adult: Secondary | ICD-10-CM

## 2021-04-02 DIAGNOSIS — Z713 Dietary counseling and surveillance: Secondary | ICD-10-CM

## 2021-04-02 MED ORDER — PHENTERMINE HCL 37.5 MG PO TABS: 37.5000 mg | ORAL_TABLET | Freq: Every day | ORAL | 0 refills | Status: DC

## 2021-04-02 NOTE — Progress Notes (Signed)
°  Subjective:     Patient ID: Marie Mills, female   DOB: 04-18-1971, 50 y.o.   MRN: [id number]  HPI [name] is a 50 year old white female,divorced, [id number], in for a weight and BP check. No complaints. Lab Results  Component Value Date   DIAGPAP  02/03/2021    - Negative for intraepithelial lesion or malignancy (NILM)   HPV NOT DETECTED 05/30/2016   [place] Negative 02/03/2021   PCP is Dr Sherrie Sport.  Review of Systems +clothes fitting better Denies any headaches or trouble sleeping Reviewed past medical,surgical, social and family history. Reviewed medications and allergies.     Objective:   Physical Exam BP 139/80 (BP Location: Left Arm, Patient Position: Sitting, Cuff Size: Large)    Pulse 86    Ht 5\' 6"  (1.676 m)    Wt 254 lb (115.2 kg)    LMP 03/23/2021 (Approximate)    BMI 41.00 kg/m  Skin warm and dry.  Lungs: clear to ausculation bilaterally. Cardiovascular: regular rate and rhythm.    She lost 4 lbs, for total of 19 lbs   Upstream - 04/02/21 0843       Pregnancy Intention Screening   Does the patient want to become pregnant in the next year? No    Does the patient's partner want to become pregnant in the next year? No    Would the patient like to discuss contraceptive options today? No      Contraception Wrap Up   Current Method Oral Contraceptive    End Method Oral Contraceptive    Contraception Counseling Provided No             Assessment:     1. Weight loss counseling, encounter for Will continue adipex Meds ordered this encounter  Medications   phentermine (ADIPEX-P) 37.5 MG tablet    Sig: Take 1 tablet (37.5 mg total) by mouth daily before breakfast. Take 1 daily    Dispense:  30 tablet    Refill:  0    Order Specific Question:   Supervising Provider    Answer:   Elonda Husky, LUTHER H [[id number]]     2. Body mass index 40.0-44.9, adult (HCC)     Plan:     Follow up in 4 weeks for weight and BP check

## 2021-04-22 DIAGNOSIS — J209 Acute bronchitis, unspecified: Secondary | ICD-10-CM | POA: Diagnosis not present

## 2021-04-22 DIAGNOSIS — J069 Acute upper respiratory infection, unspecified: Secondary | ICD-10-CM | POA: Diagnosis not present

## 2021-04-22 DIAGNOSIS — J029 Acute pharyngitis, unspecified: Secondary | ICD-10-CM | POA: Diagnosis not present

## 2021-04-30 ENCOUNTER — Ambulatory Visit: Payer: Medicaid Other | Admitting: Adult Health

## 2021-05-03 DIAGNOSIS — M545 Low back pain, unspecified: Secondary | ICD-10-CM | POA: Diagnosis not present

## 2021-05-03 DIAGNOSIS — I10 Essential (primary) hypertension: Secondary | ICD-10-CM | POA: Diagnosis not present

## 2021-05-03 DIAGNOSIS — Z6838 Body mass index (BMI) 38.0-38.9, adult: Secondary | ICD-10-CM | POA: Diagnosis not present

## 2021-05-03 DIAGNOSIS — J3081 Allergic rhinitis due to animal (cat) (dog) hair and dander: Secondary | ICD-10-CM | POA: Diagnosis not present

## 2021-05-03 DIAGNOSIS — K219 Gastro-esophageal reflux disease without esophagitis: Secondary | ICD-10-CM | POA: Diagnosis not present

## 2021-05-06 ENCOUNTER — Other Ambulatory Visit (HOSPITAL_COMMUNITY)
Admission: RE | Admit: 2021-05-06 | Discharge: 2021-05-06 | Disposition: A | Discharge status: Home / Self Care | Payer: Medicaid Other | Source: Ambulatory Visit | Attending: Adult Health | Admitting: Adult Health

## 2021-05-06 ENCOUNTER — Encounter: Payer: Self-pay | Admitting: Adult Health

## 2021-05-06 ENCOUNTER — Other Ambulatory Visit: Payer: Self-pay

## 2021-05-06 ENCOUNTER — Ambulatory Visit: Payer: Medicaid Other | Admitting: Adult Health

## 2021-05-06 VITALS — BP 135/83 | HR 92 | Ht 66.0 in | Wt 246.4 lb

## 2021-05-06 DIAGNOSIS — Z6839 Body mass index (BMI) 39.0-39.9, adult: Secondary | ICD-10-CM

## 2021-05-06 DIAGNOSIS — L720 Epidermal cyst: Secondary | ICD-10-CM | POA: Diagnosis not present

## 2021-05-06 DIAGNOSIS — Z113 Encounter for screening for infections with a predominantly sexual mode of transmission: Secondary | ICD-10-CM | POA: Insufficient documentation

## 2021-05-06 DIAGNOSIS — N898 Other specified noninflammatory disorders of vagina: Secondary | ICD-10-CM | POA: Insufficient documentation

## 2021-05-06 DIAGNOSIS — Z713 Dietary counseling and surveillance: Secondary | ICD-10-CM | POA: Diagnosis not present

## 2021-05-06 MED ORDER — PHENTERMINE HCL 37.5 MG PO TABS: 37.5000 mg | ORAL_TABLET | Freq: Every day | ORAL | 0 refills | Status: DC

## 2021-05-06 NOTE — Progress Notes (Signed)
°  Subjective:     Patient ID: Marie Mills, female   DOB: 1970-10-03, 51 y.o.   MRN: [id number]  HPI Marie Mills is a 51 year old white female, divorced, [id number] in for weight and BP check, is on phentermine and has vaginal irritation, was on antibiotic about 2-3 weeks ago. PCP is Dr Sherrie Sport Lab Results  Component Value Date   DIAGPAP  02/03/2021    - Negative for intraepithelial lesion or malignancy (NILM)   HPV NOT DETECTED 05/30/2016   [place] Negative 02/03/2021     Review of Systems +vaginal irritation No problems with phentermine still losing weight Reviewed past medical,surgical, social and family history. Reviewed medications and allergies.     Objective:   Physical Exam BP 135/83 (BP Location: Right Arm, Patient Position: Sitting, Cuff Size: Normal)    Pulse 92    Ht 5\' 6"  (1.676 m)    Wt 246 lb 6.4 oz (111.8 kg)    LMP 04/22/2021    BMI 39.77 kg/m     Skin warm and dry.  Lungs: clear to ausculation bilaterally. Cardiovascular: regular rate and rhythm.  Pelvic: external genitalia is normal in appearance,has small epidermal cyst left inner labia near clitiris, vagina: scant white discharge without odor,urethra has no lesions or masses noted, cervix:smooth and bulbous, uterus: normal size, shape and contour, non tender, no masses felt, adnexa: no masses or tenderness noted. Bladder is non tender and no masses felt. CV swab obtained.  She has lost about 9 more lbs for total of 28 lbs.  Examination chaperoned by Celene Squibb LPN    Impression: 1. Weight loss counseling, encounter for Will refill phentermine Meds ordered this encounter  Medications   phentermine (ADIPEX-P) 37.5 MG tablet    Sig: Take 1 tablet (37.5 mg total) by mouth daily before breakfast. Take 1 daily    Dispense:  30 tablet    Refill:  0    Order Specific Question:   Supervising Provider    Answer:   Elonda Husky, LUTHER H [[id number]]     2. Body mass index 39.0-39.9, adult   3. Epidermal cyst Will follow for now,  if gets bigger to remove  4. Vaginal irritation Will send CV swab to rule out BV and yeast after antibiotic use  - Cervicovaginal ancillary only( [place])  5. Screening examination for STD (sexually transmitted disease) Will send CV swab for GC/CHL,trich, - Cervicovaginal ancillary only( [place])      Plan:     Follow up in 4 weeks for weight and BP and then take a break from phentermine

## 2021-05-07 LAB — CERVICOVAGINAL ANCILLARY ONLY
Bacterial Vaginitis (gardnerella): NEGATIVE
Candida Glabrata: NEGATIVE
Candida Vaginitis: NEGATIVE
Chlamydia: NEGATIVE
Comment: NEGATIVE
Comment: NEGATIVE
Comment: NEGATIVE
Comment: NEGATIVE
Comment: NEGATIVE
Comment: NORMAL
Neisseria Gonorrhea: NEGATIVE
Trichomonas: NEGATIVE

## 2021-05-10 ENCOUNTER — Telehealth: Payer: Self-pay | Admitting: *Deleted

## 2021-05-10 NOTE — Telephone Encounter (Signed)
Pt aware vaginal swab is negative. Pt voiced understanding. [name] 

## 2021-06-03 ENCOUNTER — Ambulatory Visit: Payer: Medicaid Other | Admitting: Adult Health

## 2021-06-11 ENCOUNTER — Ambulatory Visit: Payer: Medicaid Other | Admitting: Adult Health

## 2021-06-12 ENCOUNTER — Other Ambulatory Visit: Payer: Self-pay

## 2021-06-12 ENCOUNTER — Ambulatory Visit
Admission: EM | Admit: 2021-06-12 | Discharge: 2021-06-12 | Disposition: A | Discharge status: Home / Self Care | Payer: Medicaid Other | Attending: Family Medicine | Admitting: Family Medicine

## 2021-06-12 ENCOUNTER — Ambulatory Visit: Payer: Self-pay

## 2021-06-12 DIAGNOSIS — J329 Chronic sinusitis, unspecified: Secondary | ICD-10-CM

## 2021-06-12 DIAGNOSIS — Z1152 Encounter for screening for COVID-19: Secondary | ICD-10-CM

## 2021-06-12 DIAGNOSIS — B9789 Other viral agents as the cause of diseases classified elsewhere: Secondary | ICD-10-CM

## 2021-06-12 DIAGNOSIS — J069 Acute upper respiratory infection, unspecified: Secondary | ICD-10-CM

## 2021-06-12 MED ORDER — PREDNISONE 20 MG PO TABS
40.0000 mg | ORAL_TABLET | Freq: Every day | ORAL | 0 refills | Status: DC
Start: 2021-06-12 — End: 2021-06-24

## 2021-06-12 MED ORDER — PROMETHAZINE-DM 6.25-15 MG/5ML PO SYRP: 5.0000 mL | ORAL_SOLUTION | Freq: Four times a day (QID) | ORAL | 0 refills | Status: DC | PRN

## 2021-06-12 NOTE — ED Triage Notes (Signed)
Patient states that for the past 3 days her sinuses have been stopped up and chest congestion  Patient states she took some ibuprofen and Robitussin DM about 3 hours ago  Patient states she is coughing up a lot of yellow mucus

## 2021-06-12 NOTE — ED Provider Notes (Signed)
[facility]    CSN: [id number] Arrival date & time: 06/12/21  1345      History   Chief Complaint Chief Complaint  Patient presents with   head and chest congestion    HPI Marie Mills is a 51 y.o. female.   3-day history of sinus pain and pressure, nasal congestion, sore scratchy throat, hacking cough productive of yellow mucus.  Denies fever, chills, body aches, chest pain, shortness of breath, abdominal pain, nausea vomiting or diarrhea.  Taking Mucinex DM, Singulair, nasal spray with minimal relief of symptoms.  No known sick contacts recently.  History of seasonal allergies but no known history of chronic pulmonary disease.   Past Medical History:  Diagnosis Date   BV (bacterial vaginosis) 08/29/2013   Contraceptive management 03/21/2013   GERD (gastroesophageal reflux disease)    Hypertension    Obesity    Vaginal irritation 08/29/2013    Patient Active Problem List   Diagnosis Date Noted   Screening examination for STD (sexually transmitted disease) 05/06/2021   Epidermal cyst 05/06/2021   Screening mammogram for breast cancer 02/03/2021   Routine general medical examination at a health care facility 02/03/2021   Encounter for gynecological examination with Papanicolaou smear of cervix 02/03/2021   Encounter for surveillance of contraceptive pills 02/03/2021   Anxiety and depression 02/03/2021   Weight gain 07/04/2019   Body mass index 39.0-39.9, adult 05/28/2019   Body mass index 37.0-37.9, adult 04/08/2019   Body mass index 38.0-38.9, adult 02/04/2019   Body mass index 40.0-44.9, adult ([place]) 11/01/2018   Encounter for well woman exam with routine gynecological exam 10/08/2018   Weight loss counseling, encounter for 10/08/2018   Body mass index 45.0-49.9, adult ([place]) 10/08/2018   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 present 05/30/2016   Surveillance for birth control, oral contraceptives 05/30/2016   Family planning 05/30/2016   Screening for  colorectal cancer 05/30/2016   Vaginal irritation 08/29/2013   BV (bacterial vaginosis) 08/29/2013   Essential hypertension 03/21/2013   Contraceptive management 03/21/2013    Past Surgical History:  Procedure Laterality Date   CHOLECYSTECTOMY      OB History     Gravida  4   Para  3   Term  3   Preterm      AB  1   Living  3      SAB      IAB  1   Ectopic      Multiple      Live Births  3            Home Medications    Prior to Admission medications   Medication Sig Start Date End Date Taking? Authorizing Provider  predniSONE (DELTASONE) 20 MG tablet Take 2 tablets (40 mg total) by mouth daily with breakfast. 06/12/21  Yes Volney American, PA-C  promethazine-dextromethorphan (PROMETHAZINE-DM) 6.25-15 MG/5ML syrup Take 5 mLs by mouth 4 (four) times daily as needed. 06/12/21  Yes Volney American, PA-C  amLODipine (NORVASC) 2.5 MG tablet Take by mouth.    [provider]  aspirin EC 81 MG tablet Take 81 mg by mouth daily. Swallow whole.    [provider]  fluticasone (FLONASE) 50 MCG/ACT nasal spray Place into the nose.    [provider]  ibuprofen (ADVIL) 800 MG tablet Take 800 mg by mouth 3 (three) times daily. 06/23/20   [provider]  ipratropium (ATROVENT) 0.06 % nasal spray SMARTSIG:2 Spray(s) Both Nares 4  Times Daily PRN 01/09/21   [provider]  lisinopril-hydrochlorothiazide (ZESTORETIC) 20-12.5 MG tablet Take 1 tablet by mouth daily. 02/04/19   Estill Dooms, NP  losartan (COZAAR) 50 MG tablet Take 50 mg by mouth daily. 04/23/19   [provider]  methocarbamol (ROBAXIN) 500 MG tablet Take 500 mg by mouth at bedtime. 03/27/21   [provider]  metoprolol succinate (TOPROL-XL) 25 MG 24 hr tablet Take 25 mg by mouth daily. 04/23/19   [provider]  Norgestimate-Ethinyl Estradiol Triphasic (TRI-SPRINTEC) 0.18/0.215/0.25 MG-35 MCG tablet Take 1 tablet by mouth  daily. 02/03/21   Estill Dooms, NP  omeprazole (PRILOSEC) 40 MG capsule TK 1 C PO QD Patient taking differently: Take 40 mg by mouth daily. 03/04/19   Estill Dooms, NP  phentermine (ADIPEX-P) 37.5 MG tablet Take 1 tablet (37.5 mg total) by mouth daily before breakfast. Take 1 daily 05/06/21   Estill Dooms, NP  POTASSIUM PO Take by mouth.    [provider]  sertraline (ZOLOFT) 50 MG tablet Take 1 tablet (50 mg total) by mouth daily. 02/03/21   Estill Dooms, NP    Family History Family History  Problem Relation Age of Onset   Hypertension Mother    Hyperlipidemia Mother    Diabetes Mother    Hyperlipidemia Father    Hypertension Father    Diabetes Father    Seizures Father    Cancer Maternal Aunt        breast   Diabetes Sister    Hypertension Sister    Diabetes Sister    Hypertension Sister    Diabetes Brother    Hypertension Brother    Hypertension Sister    Asthma Son    Asthma Daughter     Social History Social History   Tobacco Use   Smoking status: Never   Smokeless tobacco: Never  Vaping Use   Vaping Use: Never used  Substance Use Topics   Alcohol use: No   Drug use: No     Allergies   Penicillins, Codeine, and Sulfa antibiotics   Review of Systems Review of Systems Per HPI  Physical Exam Triage Vital Signs ED Triage Vitals  Enc Vitals Group     BP 06/12/21 1440 (!) 147/82     Pulse Rate 06/12/21 1440 (!) 101     Resp 06/12/21 1440 18     Temp 06/12/21 1440 98 F (36.7 C)     Temp Source 06/12/21 1440 Oral     SpO2 06/12/21 1440 96 %     Weight --      Height --      Head Circumference --      Peak Flow --      Pain Score 06/12/21 1441 5     Pain Loc --      Pain Edu? --      Excl. in [place]? --    No data found.  Updated Vital Signs BP (!) 147/82 (BP Location: Right Arm)    Pulse (!) 101    Temp 98 F (36.7 C) (Oral)    Resp 18    LMP 05/15/2021 (Approximate)    SpO2 96%   Visual Acuity Right Eye  Distance:   Left Eye Distance:   Bilateral Distance:    Right Eye Near:   Left Eye Near:    Bilateral Near:     Physical Exam Vitals and nursing note reviewed.  Constitutional:  Appearance: Normal appearance.  HENT:     Head: Atraumatic.     Right Ear: Tympanic membrane and external ear normal.     Left Ear: Tympanic membrane and external ear normal.     Nose: Rhinorrhea present.     Mouth/Throat:     Mouth: Mucous membranes are moist.     Pharynx: Posterior oropharyngeal erythema present.  Eyes:     Extraocular Movements: Extraocular movements intact.     Conjunctiva/sclera: Conjunctivae normal.  Cardiovascular:     Rate and Rhythm: Normal rate and regular rhythm.     Heart sounds: Normal heart sounds.  Pulmonary:     Effort: Pulmonary effort is normal.     Breath sounds: Normal breath sounds. No wheezing or rales.  Musculoskeletal:        General: Normal range of motion.     Cervical back: Normal range of motion and neck supple.  Skin:    General: Skin is warm and dry.  Neurological:     Mental Status: She is alert and oriented to person, place, and time.  Psychiatric:        Mood and Affect: Mood normal.        Thought Content: Thought content normal.     UC Treatments / Results  Labs (all labs ordered are listed, but only abnormal results are displayed) Labs Reviewed  COVID-19, FLU A+B NAA    EKG   Radiology No results found.  Procedures Procedures (including critical care time)  Medications Ordered in UC Medications - No data to display  Initial Impression / Assessment and Plan / UC Course  I have reviewed the triage vital signs and the nursing notes.  Pertinent labs & imaging results that were available during my care of the patient were reviewed by me and considered in my medical decision making (see chart for details).     Minimally tachycardic in triage, otherwise vital signs reassuring.  Suspect viral upper respiratory infection  causing viral sinusitis.  Treat with prednisone, Phenergan DM and await COVID and flu testing.  Return for acutely worsening symptoms.  Work note given.  Final Clinical Impressions(s) / UC Diagnoses   Final diagnoses:  Encounter for screening for COVID-19  Viral URI with cough  Viral sinusitis   Discharge Instructions   None    ED Prescriptions     Medication Sig Dispense Auth. Provider   predniSONE (DELTASONE) 20 MG tablet Take 2 tablets (40 mg total) by mouth daily with breakfast. 10 tablet Volney American, PA-C   promethazine-dextromethorphan (PROMETHAZINE-DM) 6.25-15 MG/5ML syrup Take 5 mLs by mouth 4 (four) times daily as needed. 100 mL Volney American, [place]      PDMP not reviewed this encounter.   Volney American, [place] 06/12/21 1459

## 2021-06-14 LAB — COVID-19, FLU A+B NAA
Influenza A, NAA: NOT DETECTED
Influenza B, NAA: NOT DETECTED
SARS-CoV-2, NAA: NOT DETECTED

## 2021-06-16 ENCOUNTER — Ambulatory Visit: Payer: Medicaid Other | Admitting: Adult Health

## 2021-06-24 ENCOUNTER — Encounter: Payer: Self-pay | Admitting: Adult Health

## 2021-06-24 ENCOUNTER — Ambulatory Visit: Payer: Medicaid Other | Admitting: Adult Health

## 2021-06-24 ENCOUNTER — Other Ambulatory Visit: Payer: Self-pay

## 2021-06-24 VITALS — BP 123/71 | HR 106 | Ht 66.0 in | Wt 258.0 lb

## 2021-06-24 DIAGNOSIS — Z6841 Body Mass Index (BMI) 40.0 and over, adult: Secondary | ICD-10-CM

## 2021-06-24 DIAGNOSIS — Z713 Dietary counseling and surveillance: Secondary | ICD-10-CM

## 2021-06-24 MED ORDER — PHENTERMINE HCL 37.5 MG PO TABS: 37.5000 mg | ORAL_TABLET | Freq: Every day | ORAL | 0 refills | Status: DC

## 2021-06-24 NOTE — Progress Notes (Signed)
?  Subjective:  ?  ? Patient ID: Marie Mills, female   DOB: 1970-09-01, 51 y.o.   MRN: [id number] ? ?HPI ?Marie Mills is a 51 year old white female,divorced, [id number] in for weight and BP check to get back on adipex. Has been off a month or more, has had bronchitis and sinus infection was on antibiotics and prednisone and has munchies. She missed almost 2 weeks of work, but better now.  ?PCP is Dr Sherrie Sport. ? ?Lab Results  ?Component Value Date  ? DIAGPAP  02/03/2021  ?  - Negative for intraepithelial lesion or malignancy (NILM)  ? HPV NOT DETECTED 05/30/2016  ? [place] Negative 02/03/2021  ?  ?Review of Systems ?Has gained weight has been off adipex and on prednisone ? Reviewed past medical,surgical, social and family history. Reviewed medications and allergies.  ?Objective:  ? Physical Exam ?BP 123/71 (BP Location: Left Arm, Patient Position: Sitting, Cuff Size: Normal)   Pulse (!) 106   Ht '5\' 6"'$  (1.676 m)   Wt 258 lb (117 kg)   LMP 06/16/2021 (Exact Date)   BMI 41.64 kg/m?   ?  Skin warm and dry.  Lungs: clear to ausculation bilaterally. Cardiovascular: regular rate and rhythm.  Gained 12 lbs. ? Upstream - 06/24/21 0926   ? ?  ? Pregnancy Intention Screening  ? Does the patient want to become pregnant in the next year? No   ? Does the patient's partner want to become pregnant in the next year? No   ? Would the patient like to discuss contraceptive options today? No   ?  ? Contraception Wrap Up  ? Current Method Oral Contraceptive   ? End Method Oral Contraceptive   ? Contraception Counseling Provided No   ? ?  ?  ? ?  ?  ?Assessment:  ?   ?1. Weight loss counseling, encounter for ?Will refill adipex ?Get back on track watching food intake and walking ?Meds ordered this encounter  ?Medications  ? phentermine (ADIPEX-P) 37.5 MG tablet  ?  Sig: Take 1 tablet (37.5 mg total) by mouth daily before breakfast. Take 1 daily  ?  Dispense:  30 tablet  ?  Refill:  0  ?  Order Specific Question:   Supervising Provider  ?  Answer:    Tania Ade H [[id number]]  ?  ? ?2. Body mass index 40.0-44.9, adult ([place]) ? ?   ?Plan:  ?   ?Follow up in 4 weeks for weight and BP check ?   ?

## 2021-06-28 NOTE — Progress Notes (Unsigned)
Gastroenterology Office Note    Referring Provider: Neale Burly, MD Primary Care Physician:  Neale Burly, MD     Chief Complaint   No chief complaint on file.    History of Present Illness   Terriann Mills is a 51 y.o. female presenting today at the request of Hasanaj, Samul Dada, MD due to       Past Medical History:  Diagnosis Date   BV (bacterial vaginosis) 08/29/2013   Contraceptive management 03/21/2013   GERD (gastroesophageal reflux disease)    Hypertension    Obesity    Vaginal irritation 08/29/2013    Past Surgical History:  Procedure Laterality Date   CHOLECYSTECTOMY      Current Outpatient Medications  Medication Sig Dispense Refill   amLODipine (NORVASC) 2.5 MG tablet Take by mouth.     aspirin EC 81 MG tablet Take 81 mg by mouth daily. Swallow whole.     ibuprofen (ADVIL) 800 MG tablet Take 800 mg by mouth 3 (three) times daily.     lisinopril-hydrochlorothiazide (ZESTORETIC) 20-12.5 MG tablet Take 1 tablet by mouth daily. 30 tablet 3   losartan (COZAAR) 50 MG tablet Take 50 mg by mouth daily.     methocarbamol (ROBAXIN) 500 MG tablet Take 500 mg by mouth at bedtime.     metoprolol succinate (TOPROL-XL) 25 MG 24 hr tablet Take 25 mg by mouth daily.     montelukast (SINGULAIR) 10 MG tablet Take 10 mg by mouth daily.     Norgestimate-Ethinyl Estradiol Triphasic (TRI-SPRINTEC) 0.18/0.215/0.25 MG-35 MCG tablet Take 1 tablet by mouth daily. 84 tablet 4   omeprazole (PRILOSEC) 40 MG capsule TK 1 C PO QD (Patient taking differently: Take 40 mg by mouth daily.) 30 capsule 6   phentermine (ADIPEX-P) 37.5 MG tablet Take 1 tablet (37.5 mg total) by mouth daily before breakfast. Take 1 daily 30 tablet 0   POTASSIUM PO Take by mouth.     sertraline (ZOLOFT) 50 MG tablet Take 1 tablet (50 mg total) by mouth daily. 30 tablet 6   No current facility-administered medications for this visit.    Allergies as of 06/29/2021 - Review Complete 06/24/2021   Allergen Reaction Noted   Penicillins Anaphylaxis 05/07/2011   Codeine Other (See Comments) 05/07/2011   Sulfa antibiotics Hives 08/29/2013    Family History  Problem Relation Age of Onset   Hypertension Mother    Hyperlipidemia Mother    Diabetes Mother    Hyperlipidemia Father    Hypertension Father    Diabetes Father    Seizures Father    Cancer Maternal Aunt        breast   Diabetes Sister    Hypertension Sister    Diabetes Sister    Hypertension Sister    Diabetes Brother    Hypertension Brother    Hypertension Sister    Asthma Son    Asthma Daughter     Social History   Socioeconomic History   Marital status: Divorced    Spouse name: Not on file   Number of children: 3   Years of education: Not on file   Highest education level: Not on file  Occupational History   Not on file  Tobacco Use   Smoking status: Never   Smokeless tobacco: Never  Vaping Use   Vaping Use: Never used  Substance and Sexual Activity   Alcohol use: No   Drug use: No   Sexual activity: Yes    Birth control/protection:  Pill  Other Topics Concern   Not on file  Social History Narrative   Not on file   Social Determinants of Health   Financial Resource Strain: High Risk   Difficulty of Paying Living Expenses: Hard  Food Insecurity: Food Insecurity Present   Worried About [place] in the Last Year: Sometimes true   Ran Out of Food in the Last Year: Sometimes true  Transportation Needs: No Transportation Needs   Lack of Transportation (Medical): No   Lack of Transportation (Non-Medical): No  Physical Activity: Sufficiently Active   Days of Exercise per Week: 7 days   Minutes of Exercise per Session: 30 min  Stress: Stress Concern Present   Feeling of Stress : To some extent  Social Connections: Socially Isolated   Frequency of Communication with Friends and Family: Once a week   Frequency of Social Gatherings with Friends and Family: Never   Attends Religious  Services: Never   Printmaker: No   Attends Music therapist: Never   Marital Status: Living with partner  Intimate Partner Violence: Not At Risk   Fear of Current or Ex-Partner: No   Emotionally Abused: No   Physically Abused: No   Sexually Abused: No     Review of Systems   Gen: Denies any fever, chills, fatigue, weight loss, lack of appetite.  CV: Denies chest pain, heart palpitations, peripheral edema, syncope.  Resp: Denies shortness of breath at rest or with exertion. Denies wheezing or cough.  GI: Denies dysphagia or odynophagia. Denies jaundice, hematemesis, fecal incontinence. GU : Denies urinary burning, urinary frequency, urinary hesitancy MS: Denies joint pain, muscle weakness, cramps, or limitation of movement.  Derm: Denies rash, itching, dry skin Psych: Denies depression, anxiety, memory loss, and confusion Heme: Denies bruising, bleeding, and enlarged lymph nodes.   Physical Exam   LMP 06/16/2021 (Exact Date)  General:   Alert and oriented. Pleasant and cooperative. Well-nourished and well-developed.  Head:  Normocephalic and atraumatic. Eyes:  Without icterus Ears:  Normal auditory acuity. Lungs:  Clear to auscultation bilaterally.  Heart:  S1, S2 present without murmurs appreciated.  Abdomen:  +BS, soft, non-tender and non-distended. No HSM noted. No guarding or rebound. No masses appreciated.  Rectal:  Deferred  Msk:  Symmetrical without gross deformities. Normal posture. Extremities:  Without edema. Neurologic:  Alert and  oriented x4;  grossly normal neurologically. Skin:  Intact without significant lesions or rashes. Psych:  Alert and cooperative. Normal mood and affect.   Assessment   Marie Mills is a 51 y.o. female presenting today with    PLAN     Annitta Needs, PhD, ANP-BC [facility]

## 2021-06-29 ENCOUNTER — Encounter: Payer: Self-pay | Admitting: Gastroenterology

## 2021-06-29 ENCOUNTER — Other Ambulatory Visit: Payer: Self-pay

## 2021-06-29 ENCOUNTER — Ambulatory Visit (INDEPENDENT_AMBULATORY_CARE_PROVIDER_SITE_OTHER): Payer: Medicaid Other | Admitting: Gastroenterology

## 2021-06-29 DIAGNOSIS — Z1211 Encounter for screening for malignant neoplasm of colon: Secondary | ICD-10-CM | POA: Insufficient documentation

## 2021-06-29 NOTE — Patient Instructions (Addendum)
We are arranging a colonoscopy in the near future! ? ?Please do not take phentermine for 7 days prior to the procedure. ? ?Further recommendations to follow! ? ?It was a pleasure to see you today. I want to create trusting relationships with patients to provide genuine, compassionate, and quality care. I value your feedback. If you receive a survey regarding your visit,  I greatly appreciate you taking time to fill this out.  ? ?Annitta Needs, PhD, ANP-BC ?[facility]  ? ? ?

## 2021-07-01 ENCOUNTER — Telehealth: Payer: Self-pay

## 2021-07-01 ENCOUNTER — Telehealth: Payer: Self-pay | Admitting: Internal Medicine

## 2021-07-01 NOTE — Telephone Encounter (Signed)
Tried to call pt to schedule TCS ASA 3 w/Dr. Carver, LMOVM for return call. ?

## 2021-07-01 NOTE — Telephone Encounter (Signed)
Pt returning call. [phone] ?

## 2021-07-02 ENCOUNTER — Other Ambulatory Visit: Payer: Self-pay

## 2021-07-02 MED ORDER — CLENPIQ 10-3.5-12 MG-GM -GM/160ML PO SOLN: 1.0000 | Freq: Once | ORAL | 0 refills | Status: AC

## 2021-07-02 NOTE — Telephone Encounter (Signed)
Tried to call pt, LMOVM for return call. 

## 2021-07-02 NOTE — Telephone Encounter (Signed)
See other phone note, TCS scheduled. ?

## 2021-07-02 NOTE — Telephone Encounter (Signed)
Spoke to pt, TCS scheduled for 07/30/21 at 2:45pm. Rx for prep sent to pharmacy. Orders entered. ?

## 2021-07-02 NOTE — Telephone Encounter (Signed)
Pre-op appt 07/28/21. Appt letter mailed with procedure instructions. ?

## 2021-07-22 NOTE — Patient Instructions (Signed)
? ? ? ? ? ? Marie Mills ? 07/22/2021  ?  ? '@PREFPERIOPPHARMACY'$ @ ? ? Your procedure is scheduled on  07/30/2021. ? ? Report to [facility] at  1245  P.M. ? ? Call this number if you have problems the morning of surgery: ? [phone] ? ? Remember: ? Follow the diet and prep instructions given to you by the office. ? ?Your last dose of phenteremine should have been 07/23/2021. ? ?  ? Take these medicines the morning of surgery with A SIP OF WATER  ? ?amlodipine, metoprolol, singulair, omeprazole. ? ?  ? Do not wear jewelry, make-up or nail polish. ? Do not wear lotions, powders, or perfumes, or deodorant. ? Do not shave 48 hours prior to surgery.  Men may shave face and neck. ? Do not bring valuables to the hospital. ? [name] is not responsible for any belongings or valuables. ? ?Contacts, dentures or bridgework may not be worn into surgery.  Leave your suitcase in the car.  After surgery it may be brought to your room. ? ?For patients admitted to the hospital, discharge time will be determined by your treatment team. ? ?Patients discharged the day of surgery will not be allowed to drive home and must have someone with them for 24 hours.  ? ? ?Special instructions:   DO NOT smoke tobacco or vape for 24 hours before your procedure. ? ?Please read over the following fact sheets that you were given. ?Anesthesia Post-op Instructions and Care and Recovery After Surgery ?  ? ? ? Colonoscopy, Adult, Care After ?This sheet gives you information about how to care for yourself after your procedure. Your health care provider may also give you more specific instructions. If you have problems or questions, contact your health care provider. ?What can I expect after the procedure? ?After the procedure, it is common to have: ?A small amount of blood in your stool for 24 hours after the procedure. ?Some gas. ?Mild cramping or bloating of your abdomen. ?Follow these instructions at home: ?Eating and drinking ? ?Drink enough fluid  to keep your urine pale yellow. ?Follow instructions from your health care provider about eating or drinking restrictions. ?Resume your normal diet as instructed by your health care provider. Avoid heavy or fried foods that are hard to digest. ?Activity ?Rest as told by your health care provider. ?Avoid sitting for a long time without moving. Get up to take short walks every 1-2 hours. This is important to improve blood flow and breathing. Ask for help if you feel weak or unsteady. ?Return to your normal activities as told by your health care provider. Ask your health care provider what activities are safe for you. ?Managing cramping and bloating ? ?Try walking around when you have cramps or feel bloated. ?Apply heat to your abdomen as told by your health care provider. Use the heat source that your health care provider recommends, such as a moist heat pack or a heating pad. ?Place a towel between your skin and the heat source. ?Leave the heat on for 20-30 minutes. ?Remove the heat if your skin turns bright red. This is especially important if you are unable to feel pain, heat, or cold. You may have a greater risk of getting burned. ?General instructions ?If you were given a sedative during the procedure, it can affect you for several hours. Do not drive or operate machinery until your health care provider says that it is safe. ?For the first 24 hours after  the procedure: ?Do not sign important documents. ?Do not drink alcohol. ?Do your regular daily activities at a slower pace than normal. ?Eat soft foods that are easy to digest. ?Take over-the-counter and prescription medicines only as told by your health care provider. ?Keep all follow-up visits as told by your health care provider. This is important. ?Contact a health care provider if: ?You have blood in your stool 2-3 days after the procedure. ?Get help right away if you have: ?More than a small spotting of blood in your stool. ?Large blood clots in your  stool. ?Swelling of your abdomen. ?Nausea or vomiting. ?A fever. ?Increasing pain in your abdomen that is not relieved with medicine. ?Summary ?After the procedure, it is common to have a small amount of blood in your stool. You may also have mild cramping and bloating of your abdomen. ?If you were given a sedative during the procedure, it can affect you for several hours. Do not drive or operate machinery until your health care provider says that it is safe. ?Get help right away if you have a lot of blood in your stool, nausea or vomiting, a fever, or increased pain in your abdomen. ?This information is not intended to replace advice given to you by your health care provider. Make sure you discuss any questions you have with your health care provider. ?Document Revised: 02/08/2019 Document Reviewed: 10/29/2018 ?Elsevier Patient Education ? [place]. ?Monitored Anesthesia Care, Care After ?This sheet gives you information about how to care for yourself after your procedure. Your health care provider may also give you more specific instructions. If you have problems or questions, contact your health care provider. ?What can I expect after the procedure? ?After the procedure, it is common to have: ?Tiredness. ?Forgetfulness about what happened after the procedure. ?Impaired judgment for important decisions. ?Nausea or vomiting. ?Some difficulty with balance. ?Follow these instructions at home: ?For the time period you were told by your health care provider: ?  ?Rest as needed. ?Do not participate in activities where you could fall or become injured. ?Do not drive or use machinery. ?Do not drink alcohol. ?Do not take sleeping pills or medicines that cause drowsiness. ?Do not make important decisions or sign legal documents. ?Do not take care of children on your own. ?Eating and drinking ?Follow the diet that is recommended by your health care provider. ?Drink enough fluid to keep your urine pale yellow. ?If  you vomit: ?Drink water, juice, or soup when you can drink without vomiting. ?Make sure you have little or no nausea before eating solid foods. ?General instructions ?Have a responsible adult stay with you for the time you are told. It is important to have someone help care for you until you are awake and alert. ?Take over-the-counter and prescription medicines only as told by your health care provider. ?If you have sleep apnea, surgery and certain medicines can increase your risk for breathing problems. Follow instructions from your health care provider about wearing your sleep device: ?Anytime you are sleeping, including during daytime naps. ?While taking prescription pain medicines, sleeping medicines, or medicines that make you drowsy. ?Avoid smoking. ?Keep all follow-up visits as told by your health care provider. This is important. ?Contact a health care provider if: ?You keep feeling nauseous or you keep vomiting. ?You feel light-headed. ?You are still sleepy or having trouble with balance after 24 hours. ?You develop a rash. ?You have a fever. ?You have redness or swelling around the IV site. ?Get help  right away if: ?You have trouble breathing. ?You have new-onset confusion at home. ?Summary ?For several hours after your procedure, you may feel tired. You may also be forgetful and have poor judgment. ?Have a responsible adult stay with you for the time you are told. It is important to have someone help care for you until you are awake and alert. ?Rest as told. Do not drive or operate machinery. Do not drink alcohol or take sleeping pills. ?Get help right away if you have trouble breathing, or if you suddenly become confused. ?This information is not intended to replace advice given to you by your health care provider. Make sure you discuss any questions you have with your health care provider. ?Document Revised: 12/19/2019 Document Reviewed: 03/07/2019 ?Elsevier Patient Education ? [place]. ? ?

## 2021-07-27 ENCOUNTER — Ambulatory Visit: Payer: Medicaid Other | Admitting: Adult Health

## 2021-07-27 ENCOUNTER — Encounter: Payer: Self-pay | Admitting: Adult Health

## 2021-07-27 VITALS — BP 129/88 | HR 92 | Ht 66.0 in | Wt 258.0 lb

## 2021-07-27 DIAGNOSIS — Z6841 Body Mass Index (BMI) 40.0 and over, adult: Secondary | ICD-10-CM

## 2021-07-27 DIAGNOSIS — Z713 Dietary counseling and surveillance: Secondary | ICD-10-CM

## 2021-07-27 MED ORDER — PHENTERMINE HCL 37.5 MG PO TABS: 37.5000 mg | ORAL_TABLET | Freq: Every day | ORAL | 0 refills | Status: DC

## 2021-07-27 NOTE — Progress Notes (Signed)
?  Subjective:  ?  ? Patient ID: Marie Mills, female   DOB: [birth year], 51 y.o.   MRN: [id number] ? ?HPI ?Marie Mills is a 51 year old white female,divorced, [id number] in for weight and BP check, she has been off adipex for 2 weeks getting ready for colonoscopy this Friday.  ?Lab Results  ?Component Value Date  ? DIAGPAP  02/03/2021  ?  - Negative for intraepithelial lesion or malignancy (NILM)  ? HPV NOT DETECTED 05/30/2016  ? Laie Negative 02/03/2021  ? PCP is Dr Sherrie Sport ? ?Review of Systems ?Has been off adipex, has not gained or lost weight ?Reviewed past medical,surgical, social and family history. Reviewed medications and allergies.  ?   ?Objective:  ? Physical Exam ?BP 129/88 (BP Location: Right Arm, Patient Position: Sitting, Cuff Size: Normal)   Pulse 92   Ht '5\' 6"'$  (1.676 m)   Wt 258 lb (117 kg)   LMP 07/22/2021   BMI 41.64 kg/m?   ?  Skin warm and dry.Lungs: clear to ausculation bilaterally. Cardiovascular: regular rate and rhythm.  ? [facility] - 07/27/21 0912   ? ?  ? Pregnancy Intention Screening  ? Does the patient want to become pregnant in the next year? No   ? Does the patient's partner want to become pregnant in the next year? No   ? Would the patient like to discuss contraceptive options today? No   ?  ? Contraception Wrap Up  ? Current Method Oral Contraceptive   ? End Method Oral Contraceptive   ? Contraception Counseling Provided No   ? ?  ?  ? ?  ?  ?Assessment:  ?   ?1. Weight loss counseling, encounter for ?Will refill phentermine ?Meds ordered this encounter  ?Medications  ? phentermine (ADIPEX-P) 37.5 MG tablet  ?  Sig: Take 1 tablet (37.5 mg total) by mouth daily before breakfast. Take 1 daily  ?  Dispense:  30 tablet  ?  Refill:  0  ?  Order Specific Question:   Supervising Provider  ?  Answer:   Tania Ade H [[id number]]  ? Try to walk more ?Eat more green and lean meals  ?Decrease carbs  ? ?2. Body mass index 40.0-44.9, adult ([place]) ? ?   ?Plan:  ?   ?Follow up in 6 weeks for weight and BP  check  ?   ?

## 2021-07-28 ENCOUNTER — Encounter (HOSPITAL_COMMUNITY)
Admission: RE | Admit: 2021-07-28 | Discharge: 2021-07-28 | Disposition: A | Discharge status: Home / Self Care | Payer: Medicaid Other | Source: Ambulatory Visit | Attending: Internal Medicine | Admitting: Internal Medicine

## 2021-07-28 ENCOUNTER — Encounter (HOSPITAL_COMMUNITY): Payer: Self-pay

## 2021-07-28 VITALS — BP 141/87 | HR 85 | Temp 97.7°F | Resp 18 | Ht 66.0 in | Wt 258.0 lb

## 2021-07-28 DIAGNOSIS — Z01818 Encounter for other preprocedural examination: Secondary | ICD-10-CM

## 2021-07-28 LAB — PREGNANCY, URINE: Preg Test, Ur: NEGATIVE

## 2021-07-30 ENCOUNTER — Ambulatory Visit (HOSPITAL_COMMUNITY): Payer: Medicaid Other | Admitting: Anesthesiology

## 2021-07-30 ENCOUNTER — Ambulatory Visit (HOSPITAL_COMMUNITY)
Admission: RE | Admit: 2021-07-30 | Discharge: 2021-07-30 | Disposition: A | Discharge status: Home / Self Care | Payer: Medicaid Other | Attending: Internal Medicine | Admitting: Internal Medicine

## 2021-07-30 ENCOUNTER — Encounter (HOSPITAL_COMMUNITY): Payer: Self-pay

## 2021-07-30 ENCOUNTER — Encounter (HOSPITAL_COMMUNITY)
Admission: RE | Disposition: A | Discharge status: Home / Self Care | Payer: Self-pay | Source: Home / Self Care | Attending: Internal Medicine

## 2021-07-30 ENCOUNTER — Ambulatory Visit (HOSPITAL_BASED_OUTPATIENT_CLINIC_OR_DEPARTMENT_OTHER): Payer: Medicaid Other | Admitting: Anesthesiology

## 2021-07-30 DIAGNOSIS — K219 Gastro-esophageal reflux disease without esophagitis: Secondary | ICD-10-CM | POA: Diagnosis not present

## 2021-07-30 DIAGNOSIS — I10 Essential (primary) hypertension: Secondary | ICD-10-CM | POA: Diagnosis not present

## 2021-07-30 DIAGNOSIS — K635 Polyp of colon: Secondary | ICD-10-CM | POA: Diagnosis not present

## 2021-07-30 DIAGNOSIS — D123 Benign neoplasm of transverse colon: Secondary | ICD-10-CM | POA: Insufficient documentation

## 2021-07-30 DIAGNOSIS — Z1211 Encounter for screening for malignant neoplasm of colon: Secondary | ICD-10-CM | POA: Diagnosis not present

## 2021-07-30 DIAGNOSIS — D125 Benign neoplasm of sigmoid colon: Secondary | ICD-10-CM | POA: Diagnosis not present

## 2021-07-30 DIAGNOSIS — K648 Other hemorrhoids: Secondary | ICD-10-CM | POA: Diagnosis not present

## 2021-07-30 HISTORY — PX: COLONOSCOPY WITH PROPOFOL: SHX5780

## 2021-07-30 HISTORY — PX: POLYPECTOMY: SHX5525

## 2021-07-30 SURGERY — COLONOSCOPY WITH PROPOFOL
Anesthesia: General

## 2021-07-30 MED ORDER — PHENYLEPHRINE HCL (PRESSORS) 10 MG/ML IV SOLN
INTRAVENOUS | Status: DC | PRN
  Administered 2021-07-30: 80 ug via INTRAVENOUS

## 2021-07-30 MED ORDER — PHENYLEPHRINE 40 MCG/ML (10ML) SYRINGE FOR IV PUSH (FOR BLOOD PRESSURE SUPPORT)
PREFILLED_SYRINGE | INTRAVENOUS | Status: AC
  Filled 2021-07-30: qty 10

## 2021-07-30 MED ORDER — LACTATED RINGERS IV SOLN
INTRAVENOUS | Status: DC
  Administered 2021-07-30: 1000 mL via INTRAVENOUS

## 2021-07-30 MED ORDER — PROPOFOL 10 MG/ML IV BOLUS
INTRAVENOUS | Status: AC
  Filled 2021-07-30: qty 20

## 2021-07-30 MED ORDER — LIDOCAINE HCL (CARDIAC) PF 100 MG/5ML IV SOSY
PREFILLED_SYRINGE | INTRAVENOUS | Status: DC | PRN
  Administered 2021-07-30: 60 mg via INTRATRACHEAL

## 2021-07-30 MED ORDER — LACTATED RINGERS IV SOLN: INTRAVENOUS | Status: DC | PRN

## 2021-07-30 MED ORDER — PROPOFOL 10 MG/ML IV BOLUS
INTRAVENOUS | Status: DC | PRN
  Administered 2021-07-30: 50 mg via INTRAVENOUS
  Administered 2021-07-30: 40 mg via INTRAVENOUS
  Administered 2021-07-30: 100 mg via INTRAVENOUS
  Administered 2021-07-30: 50 mg via INTRAVENOUS

## 2021-07-30 NOTE — H&P (Signed)
Primary Care Physician:  Neale Burly, MD ?Primary Gastroenterologist:  Dr. Abbey Chatters ? ?Pre-Procedure History & Physical: ?HPI:  Marie Mills is a 51 y.o. female is here for first ever colonoscopy for colon cancer screening purposes.  Patient denies any family history of colorectal cancer.  No melena or hematochezia.  No abdominal pain or unintentional weight loss.  No change in bowel habits.  Overall feels well from a GI standpoint. ? ?Past Medical History:  ?Diagnosis Date  ? BV (bacterial vaginosis) 08/29/2013  ? Contraceptive management 03/21/2013  ? GERD (gastroesophageal reflux disease)   ? Hypertension   ? Obesity   ? Vaginal irritation 08/29/2013  ? ? ?Past Surgical History:  ?Procedure Laterality Date  ? CHOLECYSTECTOMY    ? ? ?Prior to Admission medications   ?Medication Sig Start Date End Date Taking? Authorizing Provider  ?amLODipine (NORVASC) 2.5 MG tablet Take 2.5 mg by mouth daily.   Yes [provider]  ?aspirin EC 81 MG tablet Take 81 mg by mouth daily. Swallow whole.   Yes [provider]  ?cetirizine (ZYRTEC) 10 MG tablet Take 10 mg by mouth daily.   Yes [provider]  ?fluticasone (FLONASE) 50 MCG/ACT nasal spray Place 1 spray into both nostrils daily.   Yes [provider]  ?ipratropium (ATROVENT) 0.06 % nasal spray Place 2 sprays into both nostrils 4 (four) times daily.   Yes [provider]  ?methocarbamol (ROBAXIN) 500 MG tablet Take 500 mg by mouth daily.   Yes [provider]  ?metoprolol succinate (TOPROL-XL) 25 MG 24 hr tablet Take 25 mg by mouth daily. 04/23/19  Yes [provider]  ?montelukast (SINGULAIR) 10 MG tablet Take 10 mg by mouth daily. 05/03/21  Yes [provider]  ?Norgestimate-Ethinyl Estradiol Triphasic (TRI-SPRINTEC) 0.18/0.215/0.25 MG-35 MCG tablet Take 1 tablet by mouth daily. 02/03/21  Yes Estill Dooms, NP  ?omeprazole (PRILOSEC) 40 MG capsule TK 1 C PO QD ?Patient taking differently: Take 40  mg by mouth daily. 03/04/19  Yes Estill Dooms, NP  ?phentermine (ADIPEX-P) 37.5 MG tablet Take 1 tablet (37.5 mg total) by mouth daily before breakfast. Take 1 daily 07/27/21   Estill Dooms, NP  ? ? ?Allergies as of 07/02/2021 - Review Complete 06/29/2021  ?Allergen Reaction Noted  ? Penicillins Anaphylaxis 05/07/2011  ? Codeine Other (See Comments) 05/07/2011  ? Sulfa antibiotics Hives 08/29/2013  ? ? ?Family History  ?Problem Relation Age of Onset  ? Hypertension Mother   ? Hyperlipidemia Mother   ? Diabetes Mother   ? Hyperlipidemia Father   ? Hypertension Father   ? Diabetes Father   ? Seizures Father   ? Diabetes Sister   ? Hypertension Sister   ? Diabetes Sister   ? Hypertension Sister   ? Hypertension Sister   ? Diabetes Brother   ? Hypertension Brother   ? Asthma Daughter   ? Asthma Son   ? Cancer Maternal Aunt   ?     breast  ? Colon cancer Neg Hx   ? Colon polyps Neg Hx   ? ? ?Social History  ? ?Socioeconomic History  ? Marital status: Divorced  ?  Spouse name: Not on file  ? Number of children: 3  ? Years of education: Not on file  ? Highest education level: Not on file  ?Occupational History  ? Not on file  ?Tobacco Use  ? Smoking status: Never  ? Smokeless tobacco: Never  ?Vaping Use  ? Vaping  Use: Never used  ?Substance and Sexual Activity  ? Alcohol use: No  ? Drug use: No  ? Sexual activity: Yes  ?  Birth control/protection: Pill  ?Other Topics Concern  ? Not on file  ?Social History Narrative  ? Not on file  ? ?Social Determinants of Health  ? ?Financial Resource Strain: High Risk  ? Difficulty of Paying Living Expenses: Hard  ?Food Insecurity: Food Insecurity Present  ? Worried About Charity fundraiser in the Last Year: Sometimes true  ? Ran Out of Food in the Last Year: Sometimes true  ?Transportation Needs: No Transportation Needs  ? Lack of Transportation (Medical): No  ? Lack of Transportation (Non-Medical): No  ?Physical Activity: Sufficiently Active  ? Days of Exercise per Week:  7 days  ? Minutes of Exercise per Session: 30 min  ?Stress: Stress Concern Present  ? Feeling of Stress : To some extent  ?Social Connections: Socially Isolated  ? Frequency of Communication with Friends and Family: Once a week  ? Frequency of Social Gatherings with Friends and Family: Never  ? Attends Religious Services: Never  ? Active Member of Clubs or Organizations: No  ? Attends Archivist Meetings: Never  ? Marital Status: Living with partner  ?Intimate Partner Violence: Not At Risk  ? Fear of Current or Ex-Partner: No  ? Emotionally Abused: No  ? Physically Abused: No  ? Sexually Abused: No  ? ? ?Review of Systems: ?See HPI, otherwise negative ROS ? ?Physical Exam: ?Vital signs in last 24 hours: ?  ?  ?General:   Alert,  Well-developed, well-nourished, pleasant and cooperative in NAD ?Head:  Normocephalic and atraumatic. ?Eyes:  Sclera clear, no icterus.   Conjunctiva pink. ?Ears:  Normal auditory acuity. ?Nose:  No deformity, discharge,  or lesions. ?Mouth:  No deformity or lesions, dentition normal. ?Neck:  Supple; no masses or thyromegaly. ?Lungs:  Clear throughout to auscultation.   No wheezes, crackles, or rhonchi. No acute distress. ?Heart:  Regular rate and rhythm; no murmurs, clicks, rubs,  or gallops. ?Abdomen:  Soft, nontender and nondistended. No masses, hepatosplenomegaly or hernias noted. Normal bowel sounds, without guarding, and without rebound.   ?Msk:  Symmetrical without gross deformities. Normal posture. ?Extremities:  Without clubbing or edema. ?Neurologic:  Alert and  oriented x4;  grossly normal neurologically. ?Skin:  Intact without significant lesions or rashes. ?Cervical Nodes:  No significant cervical adenopathy. ?Psych:  Alert and cooperative. Normal mood and affect. ? ?Impression/Plan: ?Marie Mills is here for a colonoscopy to be performed for colon cancer screening purposes. ? ?The risks of the procedure including infection, bleed, or perforation as well as benefits,  limitations, alternatives and imponderables have been reviewed with the patient. Questions have been answered. All parties agreeable. ? ? ?

## 2021-07-30 NOTE — Anesthesia Preprocedure Evaluation (Signed)
Anesthesia Evaluation  ?Patient identified by MRN, date of birth, ID band ?Patient awake ? ? ? ?Reviewed: ?Allergy & Precautions, NPO status , Patient's Chart, lab work & pertinent test results, reviewed documented beta blocker date and time  ? ?Airway ?Mallampati: I ? ?TM Distance: >3 FB ?Neck ROM: Full ? ? ? Dental ? ?(+) Dental Advisory Given, Missing ?  ?Pulmonary ?neg pulmonary ROS,  ?  ?Pulmonary exam normal ?breath sounds clear to auscultation ? ? ? ? ? ? Cardiovascular ?Exercise Tolerance: Good ?hypertension, Pt. on medications and Pt. on home beta blockers ?Normal cardiovascular exam ?Rhythm:Regular Rate:Normal ? ? ?  ?Neuro/Psych ?PSYCHIATRIC DISORDERS Anxiety Depression negative neurological ROS ?   ? GI/Hepatic ?Neg liver ROS, GERD  Medicated,  ?Endo/Other  ?Morbid obesity ? Renal/GU ?negative Renal ROS  ?negative genitourinary ?  ?Musculoskeletal ?negative musculoskeletal ROS ?(+)  ? Abdominal ?  ?Peds ?negative pediatric ROS ?(+)  Hematology ?negative hematology ROS ?(+)   ?Anesthesia Other Findings ? ? Reproductive/Obstetrics ?negative OB ROS ? ?  ? ? ? ? ? ? ? ? ? ? ? ? ? ?  ?  ? ? ? ? ? ? ? ? ?Anesthesia Physical ?Anesthesia Plan ? ?ASA: 3 ? ?Anesthesia Plan: General  ? ?Post-op Pain Management: Minimal or no pain anticipated  ? ?Induction: Intravenous ? ?PONV Risk Score and Plan: Propofol infusion ? ?Airway Management Planned: Nasal Cannula and Natural Airway ? ?Additional Equipment:  ? ?Intra-op Plan:  ? ?Post-operative Plan:  ? ?Informed Consent:  ? ?Plan Discussed with:  ? ?Anesthesia Plan Comments:   ? ? ? ? ? ? ?Anesthesia Quick Evaluation ? ?

## 2021-07-30 NOTE — Anesthesia Postprocedure Evaluation (Signed)
Anesthesia Post Note ? ?Patient: Marie Mills ? ?Procedure(s) Performed: COLONOSCOPY WITH PROPOFOL ?POLYPECTOMY ? ?Patient location during evaluation: PACU ?Anesthesia Type: General ?Level of consciousness: awake and alert and oriented ?Pain management: pain level controlled ?Vital Signs Assessment: post-procedure vital signs reviewed and stable ?Respiratory status: spontaneous breathing, nonlabored ventilation and respiratory function stable ?Cardiovascular status: blood pressure returned to baseline and stable ?Postop Assessment: no apparent nausea or vomiting ?Anesthetic complications: no ? ? ?No notable events documented. ? ? ?Last Vitals:  ?Vitals:  ? 07/30/21 1250 07/30/21 1342  ?BP: 131/62 108/61  ?Pulse: 84 86  ?Resp: 18 20  ?Temp: 36.7 ?C 36.7 ?C  ?SpO2: 100% 99%  ?  ?Last Pain:  ?Vitals:  ? 07/30/21 1342  ?TempSrc: Oral  ?PainSc: 0-No pain  ? ? ?  ?  ?  ?  ?  ?  ? ?Zymiere Trostle C Flossie Wexler ? ? ? ? ?

## 2021-07-30 NOTE — Op Note (Signed)
[facility] ?Patient Name: Marie Mills ?Procedure Date: 07/30/2021 1:10 PM ?MRN: [id number] ?Date of Birth: 1970-11-02 ?Attending MD: Elon Alas. Abbey Chatters , DO ?CSN: [id number] ?Age: 51 ?Admit Type: Outpatient ?Procedure:                Colonoscopy ?Indications:              Screening for colorectal malignant neoplasm ?Providers:                Elon Alas. Abbey Chatters, DO, Caprice Kluver, Aram Candela ?Referring MD:              ?Medicines:                See the Anesthesia note for documentation of the  ?                          administered medications ?Complications:            No immediate complications. ?Estimated Blood Loss:     Estimated blood loss was minimal. ?Procedure:                Pre-Anesthesia Assessment: ?                          - The anesthesia plan was to use monitored  ?                          anesthesia care (MAC). ?                          After obtaining informed consent, the colonoscope  ?                          was passed under direct vision. Throughout the  ?                          procedure, the patient's blood pressure, pulse, and  ?                          oxygen saturations were monitored continuously. The  ?                          PCF-HQ190L ([id number]) scope was introduced through  ?                          the anus and advanced to the the cecum, identified  ?                          by appendiceal orifice and ileocecal valve. The  ?                          colonoscopy was performed without difficulty. The  ?                          patient tolerated the procedure well. The quality  ?                          of  the bowel preparation was evaluated using the  ?                          BBPS [facility] Bowel Preparation Scale) with scores  ?                          of: Right Colon = 3, Transverse Colon = 3 and Left  ?                          Colon = 3 (entire mucosa seen well with no residual  ?                          staining, small fragments of stool or opaque  ?                           liquid). The total BBPS score equals 9. ?Scope In: 1:24:46 PM ?Scope Out: 1:36:36 PM ?Scope Withdrawal Time: 0 hours 9 minutes 38 seconds  ?Total Procedure Duration: 0 hours 11 minutes 50 seconds  ?Findings: ?     The perianal and digital rectal examinations were normal. ?     Non-bleeding internal hemorrhoids were found during endoscopy. ?     A 5 mm polyp was found in the transverse colon. The polyp was sessile.  ?     The polyp was removed with a cold snare. Resection and retrieval were  ?     complete. ?     Two sessile polyps were found in the sigmoid colon. The polyps were 4 to  ?     6 mm in size. These polyps were removed with a cold snare. Resection and  ?     retrieval were complete. ?     The exam was otherwise without abnormality. ?Impression:               - Non-bleeding internal hemorrhoids. ?                          - One 5 mm polyp in the transverse colon, removed  ?                          with a cold snare. Resected and retrieved. ?                          - Two 4 to 6 mm polyps in the sigmoid colon,  ?                          removed with a cold snare. Resected and retrieved. ?                          - The examination was otherwise normal. ?Moderate Sedation: ?     Per Anesthesia Care ?Recommendation:           - Patient has a contact number available for  ?                          emergencies. The signs and symptoms of potential  ?  delayed complications were discussed with the  ?                          patient. Return to normal activities tomorrow.  ?                          Written discharge instructions were provided to the  ?                          patient. ?                          - Resume previous diet. ?                          - Continue present medications. ?                          - Await pathology results. ?                          - Repeat colonoscopy in 5 years for surveillance. ?                          - Return to GI clinic  PRN. ?Procedure Code(s):        --- Professional --- ?                          [phone], Colonoscopy, flexible; with removal of  ?                          tumor(s), polyp(s), or other lesion(s) by snare  ?                          technique ?Diagnosis Code(s):        --- Professional --- ?                          K63.5, Polyp of colon ?                          Z12.11, Encounter for screening for malignant  ?                          neoplasm of colon ?                          K64.8, Other hemorrhoids ?CPT copyright 2019 [employer] Medical Association. All rights reserved. ?The codes documented in this report are preliminary and upon coder review may  ?be revised to meet current compliance requirements. ?Elon Alas. Abbey Chatters, DO ?Elon Alas. Abbey Chatters, DO ?07/30/2021 1:39:06 PM ?This report has been signed electronically. ?Number of Addenda: 0 ?

## 2021-07-30 NOTE — Transfer of Care (Signed)
Immediate Anesthesia Transfer of Care Note ? ?Patient: Marie Mills ? ?Procedure(s) Performed: COLONOSCOPY WITH PROPOFOL ?POLYPECTOMY ? ?Patient Location: Short Stay ? ?Anesthesia Type:General ? ?Level of Consciousness: awake, alert  and oriented ? ?Airway & Oxygen Therapy: Patient Spontanous Breathing ? ?Post-op Assessment: Report given to RN and Post -op Vital signs reviewed and stable ? ?Post vital signs: Reviewed and stable ? ?Last Vitals:  ?Vitals Value Taken Time  ?BP    ?Temp    ?Pulse    ?Resp    ?SpO2    ? ? ?Last Pain:  ?Vitals:  ? 07/30/21 1321  ?TempSrc:   ?PainSc: 0-No pain  ?   ? ?Patients Stated Pain Goal: 8 (07/30/21 1250) ? ?Complications: No notable events documented. ?

## 2021-07-30 NOTE — Discharge Instructions (Signed)
?  Colonoscopy Discharge Instructions  Read the instructions outlined below and refer to this sheet in the next few weeks. These discharge instructions provide you with general information on caring for yourself after you leave the hospital. Your doctor may also give you specific instructions. While your treatment has been planned according to the most current medical practices available, unavoidable complications occasionally occur.   ACTIVITY You may resume your regular activity, but move at a slower pace for the next 24 hours.  Take frequent rest periods for the next 24 hours.  Walking will help get rid of the air and reduce the bloated feeling in your belly (abdomen).  No driving for 24 hours (because of the medicine (anesthesia) used during the test).   Do not sign any important legal documents or operate any machinery for 24 hours (because of the anesthesia used during the test).  NUTRITION Drink plenty of fluids.  You may resume your normal diet as instructed by your doctor.  Begin with a light meal and progress to your normal diet. Heavy or fried foods are harder to digest and may make you feel sick to your stomach (nauseated).  Avoid alcoholic beverages for 24 hours or as instructed.  MEDICATIONS You may resume your normal medications unless your doctor tells you otherwise.  WHAT YOU CAN EXPECT TODAY Some feelings of bloating in the abdomen.  Passage of more gas than usual.  Spotting of blood in your stool or on the toilet paper.  IF YOU HAD POLYPS REMOVED DURING THE COLONOSCOPY: No aspirin products for 7 days or as instructed.  No alcohol for 7 days or as instructed.  Eat a soft diet for the next 24 hours.  FINDING OUT THE RESULTS OF YOUR TEST Not all test results are available during your visit. If your test results are not back during the visit, make an appointment with your caregiver to find out the results. Do not assume everything is normal if you have not heard from your  caregiver or the medical facility. It is important for you to follow up on all of your test results.  SEEK IMMEDIATE MEDICAL ATTENTION IF: You have more than a spotting of blood in your stool.  Your belly is swollen (abdominal distention).  You are nauseated or vomiting.  You have a temperature over 101.  You have abdominal pain or discomfort that is severe or gets worse throughout the day.   Your colonoscopy revealed 3 polyp(s) which I removed successfully. Await pathology results, my office will contact you. I recommend repeating colonoscopy in 5 years for surveillance purposes.   Otherwise follow up with GI as needed.    I hope you have a great rest of your week!  Shaquanda Graves K. Jrake Rodriquez, D.O. Gastroenterology and Hepatology [facility]  

## 2021-08-02 LAB — SURGICAL PATHOLOGY

## 2021-08-03 ENCOUNTER — Encounter (HOSPITAL_COMMUNITY): Payer: Self-pay | Admitting: Internal Medicine

## 2021-08-09 ENCOUNTER — Ambulatory Visit
Admission: EM | Admit: 2021-08-09 | Discharge: 2021-08-09 | Disposition: A | Discharge status: Home / Self Care | Payer: Medicaid Other | Attending: Nurse Practitioner | Admitting: Nurse Practitioner

## 2021-08-09 DIAGNOSIS — J069 Acute upper respiratory infection, unspecified: Secondary | ICD-10-CM

## 2021-08-09 MED ORDER — PSEUDOEPH-BROMPHEN-DM 30-2-10 MG/5ML PO SYRP: 5.0000 mL | ORAL_SOLUTION | Freq: Four times a day (QID) | ORAL | 0 refills | Status: DC | PRN

## 2021-08-09 MED ORDER — CETIRIZINE HCL 10 MG PO TABS: 10.0000 mg | ORAL_TABLET | Freq: Every day | ORAL | 0 refills | Status: AC

## 2021-08-09 MED ORDER — DOXYCYCLINE HYCLATE 100 MG PO CAPS: 100.0000 mg | ORAL_CAPSULE | Freq: Two times a day (BID) | ORAL | 0 refills | Status: DC

## 2021-08-09 MED ORDER — AZELASTINE HCL 0.1 % NA SOLN: 2.0000 | Freq: Two times a day (BID) | NASAL | 0 refills | Status: DC

## 2021-08-09 NOTE — ED Provider Notes (Signed)
?[name] ? ? ? ?CSN: [id number] ?Arrival date & time: 08/09/21  1423 ? ? ?  ? ?History   ?Chief Complaint ?Chief Complaint  ?Patient presents with  ? Cough  ?  My chest hurts, I have congestion and runny nose - Entered by patient  ? ? ?HPI ?Marie Mills is a 51 y.o. female.  ? ?The patient is a 51 year old female who presents for upper respiratory symptoms.  Symptoms started approximately 3 days ago.  The patient complains of headache, nasal congestion, postnasal drainage, cough, and chest congestion.  She denies fever, chills, shortness of breath, difficulty breathing, or wheezing.  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  She denies exposure to COVID or flu.  She has been taking Allegra, Flonase, and Robitussin for her symptoms.  Patient states that she feels her symptoms are worsening. ? ?The history is provided by the patient.  ? ?Past Medical History:  ?Diagnosis Date  ? BV (bacterial vaginosis) 08/29/2013  ? Contraceptive management 03/21/2013  ? GERD (gastroesophageal reflux disease)   ? Hypertension   ? Obesity   ? Vaginal irritation 08/29/2013  ? ? ?Patient Active Problem List  ? Diagnosis Date Noted  ? Encounter for screening colonoscopy 06/29/2021  ? Screening examination for STD (sexually transmitted disease) 05/06/2021  ? Epidermal cyst 05/06/2021  ? Screening mammogram for breast cancer 02/03/2021  ? Routine general medical examination at a health care facility 02/03/2021  ? Encounter for gynecological examination with Papanicolaou smear of cervix 02/03/2021  ? Encounter for surveillance of contraceptive pills 02/03/2021  ? Anxiety and depression 02/03/2021  ? Weight gain 07/04/2019  ? Body mass index 39.0-39.9, adult 05/28/2019  ? Body mass index 37.0-37.9, adult 04/08/2019  ? Body mass index 38.0-38.9, adult 02/04/2019  ? Body mass index 40.0-44.9, adult ([place]) 11/01/2018  ? Encounter for well woman exam with routine  gynecological exam 10/08/2018  ? Weight loss counseling, encounter for 10/08/2018  ? Body mass index 45.0-49.9, adult ([place]) 10/08/2018  ? 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 present 05/30/2016  ? Surveillance for birth control, oral contraceptives 05/30/2016  ? Family planning 05/30/2016  ? Screening for colorectal cancer 05/30/2016  ? Vaginal irritation 08/29/2013  ? BV (bacterial vaginosis) 08/29/2013  ? Essential hypertension 03/21/2013  ? Contraceptive management 03/21/2013  ? ? ?Past Surgical History:  ?Procedure Laterality Date  ? CHOLECYSTECTOMY    ? COLONOSCOPY WITH PROPOFOL N/A 07/30/2021  ? Procedure: COLONOSCOPY WITH PROPOFOL;  Surgeon: Eloise Harman, DO;  [facility];  Service: Endoscopy;  Laterality: N/A;  2:45pm  ? POLYPECTOMY  07/30/2021  ? Procedure: POLYPECTOMY;  Surgeon: Eloise Harman, DO;  Location: [facility];  Service: Endoscopy;;  ? ? ?OB History   ? ? Gravida  ?4  ? Para  ?3  ? Term  ?3  ? Preterm  ?   ? AB  ?1  ? Living  ?3  ?  ? ? SAB  ?   ? IAB  ?1  ? Ectopic  ?   ? Multiple  ?   ? Live Births  ?3  ?   ?  ?  ? ? ? ?Home Medications   ? ?Prior to Admission medications   ?Medication Sig Start Date End Date Taking? Authorizing Provider  ?azelastine (ASTELIN) 0.1 % nasal spray Place 2 sprays into both nostrils 2 (two) times daily. Use in  each nostril as directed 08/09/21  Yes Samael Blades-Warren, Alda Lea, NP  ?brompheniramine-pseudoephedrine-DM 30-2-10 MG/5ML syrup Take 5 mLs by mouth 4 (four) times daily as needed. 08/09/21  Yes Russie Gulledge-Warren, Alda Lea, NP  ?cetirizine (ZYRTEC) 10 MG tablet Take 1 tablet (10 mg total) by mouth daily. 08/09/21  Yes Jorene Kaylor-Warren, Alda Lea, NP  ?doxycycline (VIBRAMYCIN) 100 MG capsule Take 1 capsule (100 mg total) by mouth 2 (two) times daily. 08/13/21  Yes Allard Lightsey-Warren, Alda Lea, NP  ?amLODipine (NORVASC) 2.5 MG tablet Take 2.5 mg by mouth daily.    [provider]  ?aspirin EC 81 MG tablet Take 81 mg by mouth daily. Swallow whole.     [provider]  ?fluticasone (FLONASE) 50 MCG/ACT nasal spray Place 1 spray into both nostrils daily.    [provider]  ?ipratropium (ATROVENT) 0.06 % nasal spray Place 2 sprays into both nostrils 4 (four) times daily.    [provider]  ?methocarbamol (ROBAXIN) 500 MG tablet Take 500 mg by mouth daily.    [provider]  ?metoprolol succinate (TOPROL-XL) 25 MG 24 hr tablet Take 25 mg by mouth daily. 04/23/19   [provider]  ?montelukast (SINGULAIR) 10 MG tablet Take 10 mg by mouth daily. 05/03/21   [provider]  ?Norgestimate-Ethinyl Estradiol Triphasic (TRI-SPRINTEC) 0.18/0.215/0.25 MG-35 MCG tablet Take 1 tablet by mouth daily. 02/03/21   Estill Dooms, NP  ?omeprazole (PRILOSEC) 40 MG capsule TK 1 C PO QD ?Patient taking differently: Take 40 mg by mouth daily. 03/04/19   Estill Dooms, NP  ?phentermine (ADIPEX-P) 37.5 MG tablet Take 1 tablet (37.5 mg total) by mouth daily before breakfast. Take 1 daily 07/27/21   Estill Dooms, NP  ? ? ?Family History ?Family History  ?Problem Relation Age of Onset  ? Hypertension Mother   ? Hyperlipidemia Mother   ? Diabetes Mother   ? Hyperlipidemia Father   ? Hypertension Father   ? Diabetes Father   ? Seizures Father   ? Diabetes Sister   ? Hypertension Sister   ? Diabetes Sister   ? Hypertension Sister   ? Hypertension Sister   ? Diabetes Brother   ? Hypertension Brother   ? Asthma Daughter   ? Asthma Son   ? Cancer Maternal Aunt   ?     breast  ? Colon cancer Neg Hx   ? Colon polyps Neg Hx   ? ? ?Social History ?Social History  ? ?Tobacco Use  ? Smoking status: Never  ? Smokeless tobacco: Never  ?Vaping Use  ? Vaping Use: Never used  ?Substance Use Topics  ? Alcohol use: No  ? Drug use: No  ? ? ? ?Allergies   ?Penicillins, Codeine, and Sulfa antibiotics ? ? ?Review of Systems ?Review of Systems  ?Constitutional:  Positive for fatigue.  ?HENT:  Positive for congestion, postnasal drip,  rhinorrhea and sore throat. Negative for ear pain.   ?Eyes: Negative.   ?Respiratory:  Positive for cough. Negative for shortness of breath and wheezing.   ?Cardiovascular: Negative.   ?Gastrointestinal: Negative.   ?Skin: Negative.   ?Psychiatric/Behavioral: Negative.    ? ? ?Physical Exam ?Triage Vital Signs ?ED Triage Vitals  ?Enc Vitals Group  ?   BP 08/09/21 1556 (!) 143/80  ?   Pulse Rate 08/09/21 1556 (!) 109  ?   Resp 08/09/21 1556 18  ?   Temp 08/09/21 1556 98.3 ?F (36.8 ?C)  ?   Temp Source 08/09/21 1556  Oral  ?   SpO2 08/09/21 1556 98 %  ?   Weight --   ?   Height --   ?   Head Circumference --   ?   Peak Flow --   ?   Pain Score 08/09/21 1555 0  ?   Pain Loc --   ?   Pain Edu? --   ?   Excl. in [place]? --   ? ?No data found. ? ?Updated Vital Signs ?BP (!) 143/80 (BP Location: Right Arm)   Pulse (!) 109   Temp 98.3 ?F (36.8 ?C) (Oral)   Resp 18   LMP 07/22/2021   SpO2 98%  ? ?Visual Acuity ?Right Eye Distance:   ?Left Eye Distance:   ?Bilateral Distance:   ? ?Right Eye Near:   ?Left Eye Near:    ?Bilateral Near:    ? ?Physical Exam ?Vitals reviewed.  ?Constitutional:   ?   General: She is not in acute distress. ?   Appearance: Normal appearance.  ?HENT:  ?   Head: Normocephalic.  ?   Right Ear: Tympanic membrane normal.  ?   Left Ear: Tympanic membrane normal.  ?   Nose: Congestion and rhinorrhea present.  ?   Mouth/Throat:  ?   Pharynx: Posterior oropharyngeal erythema present. No oropharyngeal exudate.  ?Eyes:  ?   Extraocular Movements: Extraocular movements intact.  ?   Conjunctiva/sclera: Conjunctivae normal.  ?   Pupils: Pupils are equal, round, and reactive to light.  ?Cardiovascular:  ?   Rate and Rhythm: Regular rhythm. Tachycardia present.  ?   Pulses: Normal pulses.  ?   Heart sounds: Normal heart sounds.  ?Pulmonary:  ?   Effort: Pulmonary effort is normal. No respiratory distress.  ?   Breath sounds: Normal breath sounds. No wheezing or rales.  ?Abdominal:  ?   General: Bowel sounds are  normal.  ?   Palpations: Abdomen is soft.  ?   Tenderness: There is no abdominal tenderness.  ?Musculoskeletal:  ?   Cervical back: Normal range of motion and neck supple.  ?Skin: ?   General: Skin is warm and dry.  ?

## 2021-08-09 NOTE — ED Triage Notes (Signed)
Pt reports cough, chest pain when coughing, nasal congestion, sinus pressure  x 3 days. Reports everybody has the same symptoms as she has at work.  ?

## 2021-08-09 NOTE — Discharge Instructions (Signed)
Take medication as prescribed.   ?We will monitor your symptoms on a watch and wait basis.  If your symptoms continue to persist by 08/13/2021, go to the pharmacy and pick up a prescription for doxycycline.  Take medication as prescribed.  Take medication with food and water. ?Increase fluids and get plenty of rest. ?Recommend using a normal saline nasal spray to help with your nasal congestion. ?Ibuprofen or Tylenol for pain, fever, or general discomfort. ?Follow-up if symptoms do not improve. ?

## 2021-09-03 ENCOUNTER — Ambulatory Visit
Admission: EM | Admit: 2021-09-03 | Discharge: 2021-09-03 | Disposition: A | Discharge status: Home / Self Care | Payer: Medicaid Other | Attending: Family Medicine | Admitting: Family Medicine

## 2021-09-03 ENCOUNTER — Encounter: Payer: Self-pay | Admitting: Emergency Medicine

## 2021-09-03 DIAGNOSIS — J069 Acute upper respiratory infection, unspecified: Secondary | ICD-10-CM | POA: Diagnosis not present

## 2021-09-03 DIAGNOSIS — J309 Allergic rhinitis, unspecified: Secondary | ICD-10-CM

## 2021-09-03 MED ORDER — PREDNISONE 20 MG PO TABS: 40.0000 mg | ORAL_TABLET | Freq: Every day | ORAL | 0 refills | Status: DC

## 2021-09-03 MED ORDER — PROMETHAZINE-DM 6.25-15 MG/5ML PO SYRP: 5.0000 mL | ORAL_SOLUTION | Freq: Four times a day (QID) | ORAL | 0 refills | Status: DC | PRN

## 2021-09-03 NOTE — ED Triage Notes (Signed)
Cough, nasal congestion, sinus pressure.  Productive cough with green sputum x 3 days.  Over the counter medications not helping.

## 2021-09-03 NOTE — ED Provider Notes (Signed)
[facility]    CSN: [id number] Arrival date & time: 09/03/21  0957      History   Chief Complaint No chief complaint on file.   HPI Marie Mills is a 51 y.o. female.   Presenting today with 3-day history of headache, sore throat, nasal congestion, postnasal drip, hacking productive cough, sinus pain and pressure, chills, body aches.  Denies chest pain, shortness of breath, abdominal pain, nausea vomiting or diarrhea.  So far taking her typical allergy regimen, nasal sprays, saline rinses with no relief.  No known new sick contacts recently.   Past Medical History:  Diagnosis Date   BV (bacterial vaginosis) 08/29/2013   Contraceptive management 03/21/2013   GERD (gastroesophageal reflux disease)    Hypertension    Obesity    Vaginal irritation 08/29/2013    Patient Active Problem List   Diagnosis Date Noted   Encounter for screening colonoscopy 06/29/2021   Screening examination for STD (sexually transmitted disease) 05/06/2021   Epidermal cyst 05/06/2021   Screening mammogram for breast cancer 02/03/2021   Routine general medical examination at a health care facility 02/03/2021   Encounter for gynecological examination with Papanicolaou smear of cervix 02/03/2021   Encounter for surveillance of contraceptive pills 02/03/2021   Anxiety and depression 02/03/2021   Weight gain 07/04/2019   Body mass index 39.0-39.9, adult 05/28/2019   Body mass index 37.0-37.9, adult 04/08/2019   Body mass index 38.0-38.9, adult 02/04/2019   Body mass index 40.0-44.9, adult ([place]) 11/01/2018   Encounter for well woman exam with routine gynecological exam 10/08/2018   Weight loss counseling, encounter for 10/08/2018   Body mass index 45.0-49.9, adult ([place]) 10/08/2018   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 present 05/30/2016   Surveillance for birth control, oral contraceptives 05/30/2016   Family planning 05/30/2016   Screening for colorectal cancer 05/30/2016   Vaginal  irritation 08/29/2013   BV (bacterial vaginosis) 08/29/2013   Essential hypertension 03/21/2013   Contraceptive management 03/21/2013    Past Surgical History:  Procedure Laterality Date   CHOLECYSTECTOMY     COLONOSCOPY WITH PROPOFOL N/A 07/30/2021   Procedure: COLONOSCOPY WITH PROPOFOL;  Surgeon: Eloise Harman, DO;  [facility];  Service: Endoscopy;  Laterality: N/A;  2:45pm   POLYPECTOMY  07/30/2021   Procedure: POLYPECTOMY;  Surgeon: Eloise Harman, DO;  [facility];  Service: Endoscopy;;    OB History     Gravida  4   Para  3   Term  3   Preterm      AB  1   Living  3      SAB      IAB  1   Ectopic      Multiple      Live Births  3            Home Medications    Prior to Admission medications   Medication Sig Start Date End Date Taking? Authorizing Provider  predniSONE (DELTASONE) 20 MG tablet Take 2 tablets (40 mg total) by mouth daily with breakfast. 09/03/21  Yes Volney American, PA-C  promethazine-dextromethorphan (PROMETHAZINE-DM) 6.25-15 MG/5ML syrup Take 5 mLs by mouth 4 (four) times daily as needed. 09/03/21  Yes Volney American, PA-C  amLODipine (NORVASC) 2.5 MG tablet Take 2.5 mg by mouth daily.    [provider]  aspirin EC 81 MG tablet Take 81 mg by mouth daily. Swallow whole.    [provider]  azelastine (ASTELIN) 0.1 % nasal spray Place 2 sprays into both nostrils 2 (two) times daily. Use in each nostril as directed 08/09/21   Leath-Warren, Alda Lea, NP  brompheniramine-pseudoephedrine-DM 30-2-10 MG/5ML syrup Take 5 mLs by mouth 4 (four) times daily as needed. 08/09/21   Leath-Warren, Alda Lea, NP  cetirizine (ZYRTEC) 10 MG tablet Take 1 tablet (10 mg total) by mouth daily. 08/09/21   Leath-Warren, Alda Lea, NP  doxycycline (VIBRAMYCIN) 100 MG capsule Take 1 capsule (100 mg total) by mouth 2 (two) times daily. 08/13/21   Leath-Warren, Alda Lea, NP  fluticasone (FLONASE) 50  MCG/ACT nasal spray Place 1 spray into both nostrils daily.    [provider]  ipratropium (ATROVENT) 0.06 % nasal spray Place 2 sprays into both nostrils 4 (four) times daily.    [provider]  methocarbamol (ROBAXIN) 500 MG tablet Take 500 mg by mouth daily.    [provider]  metoprolol succinate (TOPROL-XL) 25 MG 24 hr tablet Take 25 mg by mouth daily. 04/23/19   [provider]  montelukast (SINGULAIR) 10 MG tablet Take 10 mg by mouth daily. 05/03/21   [provider]  Norgestimate-Ethinyl Estradiol Triphasic (TRI-SPRINTEC) 0.18/0.215/0.25 MG-35 MCG tablet Take 1 tablet by mouth daily. 02/03/21   Estill Dooms, NP  omeprazole (PRILOSEC) 40 MG capsule TK 1 C PO QD Patient taking differently: Take 40 mg by mouth daily. 03/04/19   Estill Dooms, NP  phentermine (ADIPEX-P) 37.5 MG tablet Take 1 tablet (37.5 mg total) by mouth daily before breakfast. Take 1 daily 07/27/21   Estill Dooms, NP    Family History Family History  Problem Relation Age of Onset   Hypertension Mother    Hyperlipidemia Mother    Diabetes Mother    Hyperlipidemia Father    Hypertension Father    Diabetes Father    Seizures Father    Diabetes Sister    Hypertension Sister    Diabetes Sister    Hypertension Sister    Hypertension Sister    Diabetes Brother    Hypertension Brother    Asthma Daughter    Asthma Son    Cancer Maternal Aunt        breast   Colon cancer Neg Hx    Colon polyps Neg Hx     Social History Social History   Tobacco Use   Smoking status: Never   Smokeless tobacco: Never  Vaping Use   Vaping Use: Never used  Substance Use Topics   Alcohol use: No   Drug use: No     Allergies   Penicillins, Codeine, and Sulfa antibiotics   Review of Systems Review of Systems Per HPI  Physical Exam Triage Vital Signs ED Triage Vitals  Enc Vitals Group     BP 09/03/21 1125 135/65     Pulse Rate 09/03/21 1125 93     Resp  09/03/21 1125 18     Temp 09/03/21 1125 98.2 F (36.8 C)     Temp Source 09/03/21 1125 Oral     SpO2 09/03/21 1125 98 %     Weight --      Height --      Head Circumference --      Peak Flow --      Pain Score 09/03/21 1126 8     Pain Loc --      Pain Edu? --      Excl. in [place]? --    No data found.  Updated Vital Signs BP 135/65 (BP Location: Right Arm)   Pulse 93   Temp 98.2 F (36.8 C) (Oral)   Resp 18   LMP 08/09/2021 (Exact Date)   SpO2 98%   Visual Acuity Right Eye Distance:   Left Eye Distance:   Bilateral Distance:    Right Eye Near:   Left Eye Near:    Bilateral Near:     Physical Exam Vitals and nursing note reviewed.  Constitutional:      Appearance: Normal appearance.  HENT:     Head: Atraumatic.     Right Ear: Tympanic membrane and external ear normal.     Left Ear: Tympanic membrane and external ear normal.     Nose: Rhinorrhea present.     Mouth/Throat:     Mouth: Mucous membranes are moist.     Pharynx: Posterior oropharyngeal erythema present.  Eyes:     Extraocular Movements: Extraocular movements intact.     Conjunctiva/sclera: Conjunctivae normal.  Cardiovascular:     Rate and Rhythm: Normal rate and regular rhythm.     Heart sounds: Normal heart sounds.  Pulmonary:     Effort: Pulmonary effort is normal.     Breath sounds: Normal breath sounds. No wheezing or rales.  Musculoskeletal:        General: Normal range of motion.     Cervical back: Normal range of motion and neck supple.  Skin:    General: Skin is warm and dry.  Neurological:     Mental Status: She is alert and oriented to person, place, and time.  Psychiatric:        Mood and Affect: Mood normal.        Thought Content: Thought content normal.     UC Treatments / Results  Labs (all labs ordered are listed, but only abnormal results are displayed) Labs Reviewed  COVID-19, FLU A+B NAA    EKG   Radiology No results found.  Procedures Procedures (including  critical care time)  Medications Ordered in UC Medications - No data to display  Initial Impression / Assessment and Plan / UC Course  I have reviewed the triage vital signs and the nursing notes.  Pertinent labs & imaging results that were available during my care of the patient were reviewed by me and considered in my medical decision making (see chart for details).     COVID and flu test pending, vital signs benign and reassuring.  Treat with prednisone, Phenergan DM, continued allergy regimen and supportive care.  Return for acutely worsening symptoms.  Work note given.  Final Clinical Impressions(s) / UC Diagnoses   Final diagnoses:  Viral URI with cough  Allergic sinusitis   Discharge Instructions   None    ED Prescriptions     Medication Sig Dispense Auth. Provider   promethazine-dextromethorphan (PROMETHAZINE-DM) 6.25-15 MG/5ML syrup Take 5 mLs by mouth 4 (four) times daily as needed. 100 mL Volney American, PA-C   predniSONE (DELTASONE) 20 MG tablet Take 2 tablets (40 mg total) by mouth daily with breakfast. 10 tablet Volney American, [place]      PDMP not reviewed this encounter.   Volney American, [place] 09/03/21 1207

## 2021-09-04 ENCOUNTER — Ambulatory Visit: Payer: Self-pay

## 2021-09-04 LAB — COVID-19, FLU A+B NAA
Influenza A, NAA: NOT DETECTED
Influenza B, NAA: NOT DETECTED
SARS-CoV-2, NAA: NOT DETECTED

## 2021-09-06 ENCOUNTER — Ambulatory Visit
Admission: EM | Admit: 2021-09-06 | Discharge: 2021-09-06 | Disposition: A | Discharge status: Home / Self Care | Payer: Medicaid Other | Attending: Nurse Practitioner | Admitting: Nurse Practitioner

## 2021-09-06 ENCOUNTER — Telehealth: Payer: Self-pay | Admitting: Emergency Medicine

## 2021-09-06 ENCOUNTER — Encounter: Payer: Self-pay | Admitting: Emergency Medicine

## 2021-09-06 DIAGNOSIS — H6692 Otitis media, unspecified, left ear: Secondary | ICD-10-CM | POA: Diagnosis not present

## 2021-09-06 DIAGNOSIS — B309 Viral conjunctivitis, unspecified: Secondary | ICD-10-CM | POA: Diagnosis not present

## 2021-09-06 DIAGNOSIS — J069 Acute upper respiratory infection, unspecified: Secondary | ICD-10-CM

## 2021-09-06 MED ORDER — OLOPATADINE HCL 0.1 % OP SOLN: 1.0000 [drp] | Freq: Two times a day (BID) | OPHTHALMIC | 0 refills | Status: DC

## 2021-09-06 MED ORDER — AZITHROMYCIN 250 MG PO TABS: 250.0000 mg | ORAL_TABLET | Freq: Every day | ORAL | 0 refills | Status: DC

## 2021-09-06 MED ORDER — OLOPATADINE HCL 0.1 % OP SOLN: 1.0000 [drp] | Freq: Two times a day (BID) | OPHTHALMIC | 0 refills | Status: AC

## 2021-09-06 NOTE — ED Provider Notes (Signed)
[facility]    CSN: [id number] Arrival date & time: 09/06/21  1104      History   Chief Complaint No chief complaint on file.   HPI [name] is a 51 y.o. female.   The patient is a 51 year old female who presents for upper respiratory symptoms.  Patient was seen on 5/19 for the same symptoms.  Symptoms had started symptoms started approximately 3 days prior to her being seen.  She continues to have nasal congestion, postnasal drainage, cough, and chest congestion.  She now complains of pain in the left ear.  States pain has worsened over the past 24 hours.  She denies fever, chills, shortness of breath, difficulty breathing, or wheezing.  Patient was prescribed Promethazine DM, and prednisone for her symptoms.  Patient states that she feels her symptoms are worsening.  The history is provided by the patient.   Past Medical History:  Diagnosis Date   BV (bacterial vaginosis) 08/29/2013   Contraceptive management 03/21/2013   GERD (gastroesophageal reflux disease)    Hypertension    Obesity    Vaginal irritation 08/29/2013    Patient Active Problem List   Diagnosis Date Noted   Encounter for screening colonoscopy 06/29/2021   Screening examination for STD (sexually transmitted disease) 05/06/2021   Epidermal cyst 05/06/2021   Screening mammogram for breast cancer 02/03/2021   Routine general medical examination at a health care facility 02/03/2021   Encounter for gynecological examination with Papanicolaou smear of cervix 02/03/2021   Encounter for surveillance of contraceptive pills 02/03/2021   Anxiety and depression 02/03/2021   Weight gain 07/04/2019   Body mass index 39.0-39.9, adult 05/28/2019   Body mass index 37.0-37.9, adult 04/08/2019   Body mass index 38.0-38.9, adult 02/04/2019   Body mass index 40.0-44.9, adult ([place]) 11/01/2018   Encounter for well woman exam with routine gynecological exam 10/08/2018   Weight loss counseling, encounter for  10/08/2018   Body mass index 45.0-49.9, adult ([place]) 10/08/2018   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 present 05/30/2016   Surveillance for birth control, oral contraceptives 05/30/2016   Family planning 05/30/2016   Screening for colorectal cancer 05/30/2016   Vaginal irritation 08/29/2013   BV (bacterial vaginosis) 08/29/2013   Essential hypertension 03/21/2013   Contraceptive management 03/21/2013    Past Surgical History:  Procedure Laterality Date   CHOLECYSTECTOMY     COLONOSCOPY WITH PROPOFOL N/A 07/30/2021   Procedure: COLONOSCOPY WITH PROPOFOL;  Surgeon: Eloise Harman, DO;  Location: [facility];  Service: Endoscopy;  Laterality: N/A;  2:45pm   POLYPECTOMY  07/30/2021   Procedure: POLYPECTOMY;  Surgeon: Eloise Harman, DO;  Location: [facility];  Service: Endoscopy;;    OB History     Gravida  4   Para  3   Term  3   Preterm      AB  1   Living  3      SAB      IAB  1   Ectopic      Multiple      Live Births  3            Home Medications    Prior to Admission medications   Medication Sig Start Date End Date Taking? Authorizing Provider  amLODipine (NORVASC) 2.5 MG tablet Take 2.5 mg by mouth daily.    [provider]  aspirin EC 81 MG tablet Take 81 mg by mouth daily. Swallow whole.  [provider]  azelastine (ASTELIN) 0.1 % nasal spray Place 2 sprays into both nostrils 2 (two) times daily. Use in each nostril as directed 08/09/21   Hudsyn Champine-Warren, Alda Lea, NP  brompheniramine-pseudoephedrine-DM 30-2-10 MG/5ML syrup Take 5 mLs by mouth 4 (four) times daily as needed. 08/09/21   Olufemi Mofield-Warren, Alda Lea, NP  cetirizine (ZYRTEC) 10 MG tablet Take 1 tablet (10 mg total) by mouth daily. 08/09/21   Irja Wheless-Warren, Alda Lea, NP  doxycycline (VIBRAMYCIN) 100 MG capsule Take 1 capsule (100 mg total) by mouth 2 (two) times daily. 08/13/21   Tomaz Janis-Warren, Alda Lea, NP  fluticasone (FLONASE) 50 MCG/ACT nasal spray Place 1  spray into both nostrils daily.    [provider]  ipratropium (ATROVENT) 0.06 % nasal spray Place 2 sprays into both nostrils 4 (four) times daily.    [provider]  methocarbamol (ROBAXIN) 500 MG tablet Take 500 mg by mouth daily.    [provider]  metoprolol succinate (TOPROL-XL) 25 MG 24 hr tablet Take 25 mg by mouth daily. 04/23/19   [provider]  montelukast (SINGULAIR) 10 MG tablet Take 10 mg by mouth daily. 05/03/21   [provider]  Norgestimate-Ethinyl Estradiol Triphasic (TRI-SPRINTEC) 0.18/0.215/0.25 MG-35 MCG tablet Take 1 tablet by mouth daily. 02/03/21   Estill Dooms, NP  omeprazole (PRILOSEC) 40 MG capsule TK 1 C PO QD Patient taking differently: Take 40 mg by mouth daily. 03/04/19   Estill Dooms, NP  phentermine (ADIPEX-P) 37.5 MG tablet Take 1 tablet (37.5 mg total) by mouth daily before breakfast. Take 1 daily 07/27/21   Estill Dooms, NP  predniSONE (DELTASONE) 20 MG tablet Take 2 tablets (40 mg total) by mouth daily with breakfast. 09/03/21   Volney American, PA-C  promethazine-dextromethorphan (PROMETHAZINE-DM) 6.25-15 MG/5ML syrup Take 5 mLs by mouth 4 (four) times daily as needed. 09/03/21   Volney American, PA-C    Family History Family History  Problem Relation Age of Onset   Hypertension Mother    Hyperlipidemia Mother    Diabetes Mother    Hyperlipidemia Father    Hypertension Father    Diabetes Father    Seizures Father    Diabetes Sister    Hypertension Sister    Diabetes Sister    Hypertension Sister    Hypertension Sister    Diabetes Brother    Hypertension Brother    Asthma Daughter    Asthma Son    Cancer Maternal Aunt        breast   Colon cancer Neg Hx    Colon polyps Neg Hx     Social History Social History   Tobacco Use   Smoking status: Never   Smokeless tobacco: Never  Vaping Use   Vaping Use: Never used  Substance Use Topics   Alcohol use: No    Drug use: No     Allergies   Penicillins, Codeine, and Sulfa antibiotics   Review of Systems Review of Systems  Constitutional:  Positive for activity change, appetite change and fatigue.  HENT:  Positive for congestion, ear pain (left), postnasal drip, rhinorrhea and sore throat.   Eyes: Negative.   Respiratory:  Positive for cough. Negative for shortness of breath and wheezing.   Cardiovascular: Negative.   Gastrointestinal: Negative.   Skin: Negative.   Psychiatric/Behavioral: Negative.      Physical Exam Triage Vital Signs ED Triage Vitals  Enc Vitals Group     BP 08/09/21 1556 (!) 143/80  Pulse Rate 08/09/21 1556 (!) 109     Resp 08/09/21 1556 18     Temp 08/09/21 1556 98.3 F (36.8 C)     Temp Source 08/09/21 1556 Oral     SpO2 08/09/21 1556 98 %     Weight --      Height --      Head Circumference --      Peak Flow --      Pain Score 08/09/21 1555 0     Pain Loc --      Pain Edu? --      Excl. in [place]? --    No data found.  Updated Vital Signs BP (!) 145/85 (BP Location: Right Arm)   Pulse 83   Temp 97.8 F (36.6 C) (Oral)   Resp 18   LMP 08/09/2021 (Exact Date)   SpO2 98%   Visual Acuity Right Eye Distance:   Left Eye Distance:   Bilateral Distance:    Right Eye Near:   Left Eye Near:    Bilateral Near:     Physical Exam Vitals reviewed.  Constitutional:      General: She is not in acute distress.    Appearance: Normal appearance.  HENT:     Head: Normocephalic.     Right Ear: Tympanic membrane normal. Tympanic membrane is not erythematous or bulging.     Left Ear: Ear canal and external ear normal. Tympanic membrane is erythematous and bulging.     Nose: Congestion and rhinorrhea present.     Mouth/Throat:     Pharynx: Posterior oropharyngeal erythema present. No oropharyngeal exudate.  Eyes:     General: Lids are normal. Gaze aligned appropriately.        Right eye: Discharge present.        Left eye: Discharge present.     Extraocular Movements: Extraocular movements intact.     Conjunctiva/sclera:     Right eye: Right conjunctiva is injected. No chemosis.    Left eye: Left conjunctiva is injected. No chemosis.    Pupils: Pupils are equal, round, and reactive to light.  Cardiovascular:     Rate and Rhythm: Regular rhythm. Tachycardia present.     Pulses: Normal pulses.     Heart sounds: Normal heart sounds.  Pulmonary:     Effort: Pulmonary effort is normal. No respiratory distress.     Breath sounds: Normal breath sounds. No wheezing or rales.  Abdominal:     General: Bowel sounds are normal.     Palpations: Abdomen is soft.     Tenderness: There is no abdominal tenderness.  Musculoskeletal:     Cervical back: Normal range of motion and neck supple.  Skin:    General: Skin is warm and dry.     Capillary Refill: Capillary refill takes less than 2 seconds.  Neurological:     General: No focal deficit present.     Mental Status: She is alert and oriented to person, place, and time.  Psychiatric:        Mood and Affect: Mood normal.        Behavior: Behavior normal.     UC Treatments / Results  Labs (all labs ordered are listed, but only abnormal results are displayed) Labs Reviewed - No data to display  EKG   Radiology No results found.  Procedures Procedures (including critical care time)  Medications Ordered in UC Medications - No data to display  Initial Impression / Assessment and Plan / UC Course  I have reviewed the triage vital signs and the nursing notes.  Pertinent labs & imaging results that were available during my care of the patient were reviewed by me and considered in my medical decision making (see chart for details).  The patient is a 51 year old female who presents with upper respiratory symptoms.  Symptoms have been present for approximately 6 to 7 days.  She was seen on 5/19 for the same or similar symptoms, and returns today as she reports her symptoms are  worsening.  On exam, she is afebrile, she is in no acute distress, her lungs are clear.  She does have moderate nasal congestion and runny nose.  Patient also has bulging and erythema of the left TM.  Initially, she has injection of the bilateral conjunctiva with discharge noted.  We will treat the patient with azithromycin as she has a history of penicillin allergy.  Patient was advised to continue the promethazine and prednisone and fluticasone she is currently taking.  Continue supportive care to include increase fluids and getting plenty of rest.  May also continue over-the-counter ibuprofen or Tylenol for pain or fever.  Follow-up if symptoms worsen or do not improve. Final Clinical Impressions(s) / UC Diagnoses   Final diagnoses:  None   Discharge Instructions   None      ED Prescriptions   None    PDMP not reviewed this encounter.   Tish Men, NP 08/09/21 1652    Tish Men, NP 09/06/21 1246

## 2021-09-06 NOTE — Discharge Instructions (Addendum)
Take medication as prescribed. As is, continue the current medications you are previously prescribed. Increase fluids and allow for plenty of rest. May take over-the-counter Tylenol or ibuprofen as needed for pain or fever. Recommend using a humidifier

## 2021-09-06 NOTE — ED Triage Notes (Signed)
Seen on Friday and was given prednisone and cough mediations.  States symptoms are worse.  Bilateral eye redness and drainage, green nasal congestion and left ear pain

## 2021-09-08 DIAGNOSIS — Z6841 Body Mass Index (BMI) 40.0 and over, adult: Secondary | ICD-10-CM | POA: Diagnosis not present

## 2021-09-08 DIAGNOSIS — I10 Essential (primary) hypertension: Secondary | ICD-10-CM | POA: Diagnosis not present

## 2021-09-08 DIAGNOSIS — J0120 Acute ethmoidal sinusitis, unspecified: Secondary | ICD-10-CM | POA: Diagnosis not present

## 2021-09-09 ENCOUNTER — Ambulatory Visit: Payer: Medicaid Other | Admitting: Adult Health

## 2021-09-11 ENCOUNTER — Encounter (HOSPITAL_COMMUNITY): Payer: Self-pay | Admitting: *Deleted

## 2021-09-11 ENCOUNTER — Other Ambulatory Visit: Payer: Self-pay

## 2021-09-11 ENCOUNTER — Emergency Department (HOSPITAL_COMMUNITY)
Admission: EM | Admit: 2021-09-11 | Discharge: 2021-09-11 | Disposition: A | Discharge status: Home / Self Care | Payer: Medicaid Other | Attending: Emergency Medicine | Admitting: Emergency Medicine

## 2021-09-11 ENCOUNTER — Emergency Department (HOSPITAL_COMMUNITY): Payer: Medicaid Other

## 2021-09-11 DIAGNOSIS — Z20822 Contact with and (suspected) exposure to covid-19: Secondary | ICD-10-CM | POA: Insufficient documentation

## 2021-09-11 DIAGNOSIS — I10 Essential (primary) hypertension: Secondary | ICD-10-CM | POA: Diagnosis not present

## 2021-09-11 DIAGNOSIS — J069 Acute upper respiratory infection, unspecified: Secondary | ICD-10-CM | POA: Diagnosis not present

## 2021-09-11 DIAGNOSIS — R0602 Shortness of breath: Secondary | ICD-10-CM | POA: Diagnosis not present

## 2021-09-11 DIAGNOSIS — Z7982 Long term (current) use of aspirin: Secondary | ICD-10-CM | POA: Diagnosis not present

## 2021-09-11 DIAGNOSIS — R059 Cough, unspecified: Secondary | ICD-10-CM | POA: Diagnosis not present

## 2021-09-11 DIAGNOSIS — H66002 Acute suppurative otitis media without spontaneous rupture of ear drum, left ear: Secondary | ICD-10-CM | POA: Diagnosis not present

## 2021-09-11 DIAGNOSIS — Z79899 Other long term (current) drug therapy: Secondary | ICD-10-CM | POA: Insufficient documentation

## 2021-09-11 DIAGNOSIS — B9789 Other viral agents as the cause of diseases classified elsewhere: Secondary | ICD-10-CM | POA: Diagnosis not present

## 2021-09-11 LAB — GROUP A STREP BY PCR: Group A Strep by PCR: NOT DETECTED

## 2021-09-11 LAB — RESP PANEL BY RT-PCR (FLU A&B, COVID) ARPGX2
Influenza A by PCR: NEGATIVE
Influenza B by PCR: NEGATIVE
SARS Coronavirus 2 by RT PCR: NEGATIVE

## 2021-09-11 MED ORDER — DEXAMETHASONE SODIUM PHOSPHATE 4 MG/ML IJ SOLN
4.0000 mg | Freq: Once | INTRAMUSCULAR | Status: AC
  Administered 2021-09-11: 4 mg via INTRAMUSCULAR
  Filled 2021-09-11: qty 1

## 2021-09-11 MED ORDER — CLINDAMYCIN HCL 150 MG PO CAPS: 300.0000 mg | ORAL_CAPSULE | Freq: Three times a day (TID) | ORAL | 0 refills | Status: AC

## 2021-09-11 MED ORDER — KETOROLAC TROMETHAMINE 15 MG/ML IJ SOLN
15.0000 mg | Freq: Once | INTRAMUSCULAR | Status: AC
Start: 2021-09-11 — End: 2021-09-11
  Administered 2021-09-11: 15 mg via INTRAMUSCULAR
  Filled 2021-09-11: qty 1

## 2021-09-11 MED ORDER — LIDOCAINE VISCOUS HCL 2 % MT SOLN
15.0000 mL | Freq: Once | OROMUCOSAL | Status: AC
Start: 2021-09-11 — End: 2021-09-11
  Administered 2021-09-11: 15 mL via OROMUCOSAL
  Filled 2021-09-11: qty 15

## 2021-09-11 NOTE — Discharge Instructions (Addendum)
Like we discussed, it appears that you still likely have a left-sided inner ear infection.  Given your penicillin allergy and that you have also already had a course of azithromycin, I am going to prescribe you a new antibiotic called clindamycin that I want you to take 3 times per day.  Please take it until all the medication is gone.  Please continue taking Tylenol and Motrin for management of your throat pain and body aches.  Please follow the instructions on the bottles.  I would also recommend salt water gargles as well as warm tea with honey.  If you develop any new or worsening symptoms please come back to the emergency department.  Please follow-up with your primary care provider regarding your symptoms as well as this visit today.

## 2021-09-11 NOTE — ED Triage Notes (Signed)
Pt has been seen x 2 at [facility] for congestion and ear pain, started on antibiotics last visit on 5/22 and has finished them.  C/o sore throat, yesterday pain is worse. Denies any fever.

## 2021-09-11 NOTE — ED Provider Notes (Signed)
[facility] Provider Note   CSN: [id number] Arrival date & time: 09/11/21  1354     History  Chief Complaint  Patient presents with   Otalgia    Marie Mills is a 51 y.o. female.  HPI Patient is a 51 year old female with a history of hypertension who presents to the emergency department due to persistent productive cough, body aches, sore throat, left ear pain.  Patient initially seen at urgent care on May 19 and diagnosed with a viral URI with cough as well as allergic sinusitis.  She was discharged on prednisone as well as Promethazine DM.  She was seen again in urgent care and on May 22 and diagnosed with left-sided otitis media.  She was urged to continue her previously prescribed medications and was also started on azithromycin given that she also has a penicillin allergy.  She states that she has completed all of these medications and her symptoms have persisted.  Still reports worsening sore throat with swallowing, left ear pain, body aches, productive cough, as well as chest pain when coughing.  No nausea, vomiting.  Denies a history of diabetes mellitus.    Home Medications Prior to Admission medications   Medication Sig Start Date End Date Taking? Authorizing Provider  amLODipine (NORVASC) 2.5 MG tablet Take 2.5 mg by mouth daily.   Yes [provider]  aspirin EC 81 MG tablet Take 81 mg by mouth daily. Swallow whole.   Yes [provider]  azelastine (ASTELIN) 0.1 % nasal spray Place 2 sprays into both nostrils 2 (two) times daily. Use in each nostril as directed 08/09/21  Yes Leath-Warren, Alda Lea, NP  budesonide (PULMICORT) 1 MG/2ML nebulizer solution Take 1 mg by nebulization daily. 09/08/21  Yes [provider]  cetirizine (ZYRTEC) 10 MG tablet Take 1 tablet (10 mg total) by mouth daily. 08/09/21  Yes Leath-Warren, Alda Lea, NP  clindamycin (CLEOCIN) 150 MG capsule Take 2 capsules (300 mg total) by mouth 3 (three) times  daily for 5 days. 09/11/21 09/16/21 Yes Rayna Sexton, PA-C  fluticasone (FLONASE) 50 MCG/ACT nasal spray Place 1 spray into both nostrils daily.   Yes [provider]  ipratropium (ATROVENT) 0.06 % nasal spray Place 2 sprays into both nostrils 4 (four) times daily.   Yes [provider]  losartan (COZAAR) 50 MG tablet Take 50 mg by mouth daily. 09/08/21  Yes [provider]  methocarbamol (ROBAXIN) 500 MG tablet Take 500 mg by mouth daily.   Yes [provider]  metoprolol succinate (TOPROL-XL) 25 MG 24 hr tablet Take 25 mg by mouth daily. 04/23/19  Yes [provider]  montelukast (SINGULAIR) 10 MG tablet Take 10 mg by mouth daily. 05/03/21  Yes [provider]  Norgestimate-Ethinyl Estradiol Triphasic (TRI-SPRINTEC) 0.18/0.215/0.25 MG-35 MCG tablet Take 1 tablet by mouth daily. 02/03/21  Yes Derrek Monaco A, NP  olopatadine (PATANOL) 0.1 % ophthalmic solution Place 1 drop into both eyes 2 (two) times daily for 10 days. 09/06/21 09/16/21 Yes Leath-Warren, Alda Lea, NP  omeprazole (PRILOSEC) 40 MG capsule TK 1 C PO QD Patient taking differently: Take 40 mg by mouth daily. 03/04/19  Yes Estill Dooms, NP  phentermine (ADIPEX-P) 37.5 MG tablet Take 1 tablet (37.5 mg total) by mouth daily before breakfast. Take 1 daily 07/27/21  Yes Derrek Monaco A, NP  azithromycin (ZITHROMAX) 250 MG tablet Take 1 tablet (250 mg total) by mouth daily. Take first 2 tablets together, then 1 every day until  finished. Patient not taking: Reported on 09/11/2021 09/06/21   Leath-Warren, Alda Lea, NP  predniSONE (DELTASONE) 20 MG tablet Take 2 tablets (40 mg total) by mouth daily with breakfast. Patient not taking: Reported on 09/11/2021 09/03/21   Volney American, PA-C      Allergies    Penicillins, Codeine, and Sulfa antibiotics    Review of Systems   Review of Systems  All other systems reviewed and are negative. Ten systems reviewed and are  negative for acute change, except as noted in the HPI.   Physical Exam Updated Vital Signs BP (!) 152/78 (BP Location: Left Arm)   Pulse 90   Temp 98.1 F (36.7 C) (Oral)   Resp 18   Ht '5\' 6"'$  (1.676 m)   Wt 119 kg   LMP 09/11/2021   SpO2 100%   BMI 42.36 kg/m  Physical Exam Vitals and nursing note reviewed.  Constitutional:      General: She is not in acute distress.    Appearance: Normal appearance. She is not ill-appearing, toxic-appearing or diaphoretic.  HENT:     Head: Normocephalic and atraumatic.     Right Ear: Tympanic membrane, ear canal and external ear normal. There is no impacted cerumen.     Left Ear: Ear canal and external ear normal. There is no impacted cerumen.     Ears:     Comments: Right ear: External ear and EAC appear normal.  Clear middle ear effusion noted.  TMs pearly gray and nonbulging.  Left ear: External ear and EAC appear normal.  Left TM is erythematous and mildly bulging.    Nose: Nose normal.     Mouth/Throat:     Mouth: Mucous membranes are moist.     Pharynx: Oropharynx is clear. Posterior oropharyngeal erythema present. No oropharyngeal exudate.     Comments: Uvula midline.  Small amount of erythema noted in the posterior oropharynx.  No exudates.  Readily handling secretions.  No stridor.  No hot potato voice.  Soft submental compartments. Eyes:     Extraocular Movements: Extraocular movements intact.  Cardiovascular:     Rate and Rhythm: Normal rate and regular rhythm.     Pulses: Normal pulses.     Heart sounds: Normal heart sounds. No murmur heard.   No friction rub. No gallop.  Pulmonary:     Effort: Pulmonary effort is normal. No respiratory distress.     Breath sounds: Normal breath sounds. No stridor. No wheezing, rhonchi or rales.  Abdominal:     General: Abdomen is flat.     Palpations: Abdomen is soft.     Tenderness: There is no abdominal tenderness.  Musculoskeletal:        General: Normal range of motion.     Cervical  back: Normal range of motion and neck supple. No tenderness.  Skin:    General: Skin is warm and dry.  Neurological:     General: No focal deficit present.     Mental Status: She is alert and oriented to person, place, and time.  Psychiatric:        Mood and Affect: Mood normal.        Behavior: Behavior normal.   ED Results / Procedures / Treatments   Labs (all labs ordered are listed, but only abnormal results are displayed) Labs Reviewed  GROUP A STREP BY PCR  RESP PANEL BY RT-PCR (FLU A&B, COVID) [id number]   EKG None  Radiology DG Chest Portable 1 View  Result Date:  09/11/2021 CLINICAL DATA:  Productive cough.  Shortness of breath. EXAM: PORTABLE CHEST 1 VIEW COMPARISON:  [facility] chest 06/29/2020 FINDINGS: Cardiac silhouette and mediastinal contours are within normal limits. The lungs are clear. No pleural effusion or pneumothorax. No acute skeletal abnormality. IMPRESSION: No active disease. Electronically Signed   By: Yvonne Kendall M.D.   On: 09/11/2021 14:36    Procedures Procedures   Medications Ordered in ED Medications  dexamethasone (DECADRON) injection 4 mg (4 mg Intramuscular Given 09/11/21 1431)  ketorolac (TORADOL) 15 MG/ML injection 15 mg (15 mg Intramuscular Given 09/11/21 1431)  lidocaine (XYLOCAINE) 2 % viscous mouth solution 15 mL (15 mLs Mouth/Throat Given 09/11/21 1431)   ED Course/ Medical Decision Making/ A&P                           Medical Decision Making Amount and/or Complexity of Data Reviewed Radiology: ordered.  Risk Prescription drug management.  Pt is a 51 y.o. afebrile nontoxic-appearing female who presents to the emergency department for reevaluation of left ear pain as well as productive cough, sore throat, congestion.  Initially diagnosed with a viral URI at urgent care and then reevaluated and diagnosed with left-sided otitis media.  She has completed a course of azithromycin without improvement.  Labs: Group A strep by PCR not  detected. Respiratory panel is pending.  Imaging: Chest x-ray shows no active disease.  I, Rayna Sexton, PA-C, personally reviewed and evaluated these images and lab results as part of my medical decision-making.  On my exam heart is regular rate and rhythm without murmurs, rubs, or gallops.  Lungs clear to auscultation bilaterally.  Mild erythema noted in the posterior oropharynx.  Soft submental compartments.  Uvula midline.  Readily handling secretions.  Does not appear consistent with PTA or Ludwig's angina at this time.  Left TM is erythematous and bulging.  Concerning for continued otitis media.  Patient completed a course of azithromycin and has an allergy to penicillin.  Due to this will discharge on a course of clindamycin.  Given patient's persistent productive cough I obtained a chest x-ray which is reassuring.  I obtained a strep test which is negative.  Patient symptoms were treated with viscous lidocaine, Toradol, as well as Decadron.  She reports moderate improvement.  Patient appears stable for discharge at this time and she is agreeable.  She is going to check the results of her respiratory panel on MyChart.  We discussed return precautions.  Patient given a work note.  Her questions were answered and she was amicable at the time of discharge.  Note: Portions of this report may have been transcribed using voice recognition software. Every effort was made to ensure accuracy; however, inadvertent computerized transcription errors may be present.   Final Clinical Impression(s) / ED Diagnoses Final diagnoses:  Acute suppurative otitis media of left ear without spontaneous rupture of tympanic membrane, recurrence not specified  Viral URI with cough   Rx / DC Orders ED Discharge Orders          Ordered    clindamycin (CLEOCIN) 150 MG capsule  3 times daily        09/11/21 1730              Rayna Sexton, PA-C 09/11/21 1738    Noemi Chapel, MD 09/11/21  Pauline Aus

## 2021-09-14 ENCOUNTER — Ambulatory Visit: Payer: Medicaid Other | Admitting: Adult Health

## 2021-09-14 ENCOUNTER — Encounter: Payer: Self-pay | Admitting: Adult Health

## 2021-09-14 VITALS — BP 137/87 | HR 84 | Ht 66.0 in | Wt 267.0 lb

## 2021-09-14 DIAGNOSIS — Z713 Dietary counseling and surveillance: Secondary | ICD-10-CM | POA: Diagnosis not present

## 2021-09-14 DIAGNOSIS — Z6841 Body Mass Index (BMI) 40.0 and over, adult: Secondary | ICD-10-CM

## 2021-09-14 NOTE — Progress Notes (Signed)
  Subjective:     Patient ID: Marie Mills, female   DOB: 08/12/1970, 51 y.o.   MRN: [id number]  HPI Marie Mills is a 51 year old white female, divorced, [phone], in for weight and BP check, she had not been taking phentermine, ahs been sick and has been on antibiotics and stilll says can't hear out of left ear. Lab Results  Component Value Date   DIAGPAP  02/03/2021    - Negative for intraepithelial lesion or malignancy (NILM)   HPV NOT DETECTED 05/30/2016   [place] Negative 02/03/2021   PCP is Dr Sherrie Sport  Review of Systems Has gained weight, off phentermine Can't hear out of left ear   Reviewed past medical,surgical, social and family history. Reviewed medications and allergies.  Objective:   Physical Exam BP 137/87 (BP Location: Right Arm, Patient Position: Sitting, Cuff Size: Normal)   Pulse 84   Ht '5\' 6"'$  (1.676 m)   Wt 267 lb (121.1 kg)   LMP 09/11/2021   BMI 43.09 kg/m     Skin warm and dry. Lungs: clear to ausculation bilaterally. Cardiovascular: regular rate and rhythm. Has redness both ears, no drainage  Upstream - 09/14/21 1433       Pregnancy Intention Screening   Does the patient want to become pregnant in the next year? No    Does the patient's partner want to become pregnant in the next year? No    Would the patient like to discuss contraceptive options today? No      Contraception Wrap Up   Current Method Oral Contraceptive    End Method Oral Contraceptive    Contraception Counseling Provided No             Assessment:     1. Weight loss counseling, encounter for Keep trying to lose weight  2. Body mass index 40.0-44.9, adult (HCC)     Plan:     Follow up in 4 weeks about restarting phentermine    May need ENT consult will talk with PCP

## 2021-09-15 ENCOUNTER — Other Ambulatory Visit: Payer: Self-pay | Admitting: Internal Medicine

## 2021-10-04 ENCOUNTER — Telehealth: Payer: Self-pay | Admitting: Adult Health

## 2021-10-04 MED ORDER — PHENTERMINE HCL 37.5 MG PO TABS: 37.5000 mg | ORAL_TABLET | Freq: Every day | ORAL | 0 refills | Status: DC

## 2021-10-04 NOTE — Telephone Encounter (Signed)
Patient calling stating that she is off her steroids and wants to know if you would call her in script for her weight loss 37.5 adipex-p to [name]

## 2021-10-04 NOTE — Telephone Encounter (Signed)
Refilled adipex

## 2021-10-04 NOTE — Addendum Note (Signed)
Addended by: Derrek Monaco A on: 10/04/2021 11:50 AM   Modules accepted: Orders

## 2021-10-12 ENCOUNTER — Ambulatory Visit: Payer: Medicaid Other | Admitting: Adult Health

## 2021-10-16 ENCOUNTER — Other Ambulatory Visit: Payer: Self-pay | Admitting: Internal Medicine

## 2021-10-26 ENCOUNTER — Ambulatory Visit: Payer: Medicaid Other | Admitting: Adult Health

## 2021-10-29 ENCOUNTER — Encounter: Payer: Self-pay | Admitting: Adult Health

## 2021-10-29 ENCOUNTER — Ambulatory Visit: Payer: Medicaid Other | Admitting: Adult Health

## 2021-10-29 VITALS — BP 128/73 | HR 89 | Ht 65.0 in | Wt 263.0 lb

## 2021-10-29 DIAGNOSIS — M25461 Effusion, right knee: Secondary | ICD-10-CM | POA: Diagnosis not present

## 2021-10-29 DIAGNOSIS — Z713 Dietary counseling and surveillance: Secondary | ICD-10-CM | POA: Diagnosis not present

## 2021-10-29 DIAGNOSIS — Z6841 Body Mass Index (BMI) 40.0 and over, adult: Secondary | ICD-10-CM | POA: Diagnosis not present

## 2021-10-29 DIAGNOSIS — M25561 Pain in right knee: Secondary | ICD-10-CM

## 2021-10-29 MED ORDER — PHENTERMINE HCL 37.5 MG PO TABS: 37.5000 mg | ORAL_TABLET | Freq: Every day | ORAL | 0 refills | Status: DC

## 2021-10-29 NOTE — Progress Notes (Signed)
  Subjective:     Patient ID: Marie Mills, female   DOB: [birth year], 51 y.o.   MRN: [id number]  HPI Marie Mills is a 51 year old white female, divorced, [id number] in for weight and BP check, is off prednisone and has lost 4 lbs. She is complaining of pain and swelling in right knee for 2 weeks, no known injury. Lab Results  Component Value Date   DIAGPAP  02/03/2021    - Negative for intraepithelial lesion or malignancy (NILM)   HPV NOT DETECTED 05/30/2016   [place] Negative 02/03/2021   PCP is Marie Mills  Review of Systems +weight loss Pain and swelling right knee, no known injury Reviewed past medical,surgical, social and family history. Reviewed medications and allergies.     Objective:   Physical Exam BP 128/73 (BP Location: Left Arm, Patient Position: Sitting, Cuff Size: Large)   Pulse 89   Ht '5\' 5"'$  (1.651 m)   Wt 263 lb (119.3 kg)   LMP 10/19/2021   BMI 43.77 kg/m     Skin warm and dry. Lungs: clear to ausculation bilaterally. Cardiovascular: regular rate and rhythm.  +swelling right knee Fall risk is low  Upstream - 10/29/21 1038       Pregnancy Intention Screening   Does the patient want to become pregnant in the next year? No    Does the patient's partner want to become pregnant in the next year? No    Would the patient like to discuss contraceptive options today? No      Contraception Wrap Up   Current Method Oral Contraceptive    End Method Oral Contraceptive             Assessment:     1. Weight loss counseling, encounter for Continue weight loss efforts Will refill adipex  Meds ordered this encounter  Medications   phentermine (ADIPEX-P) 37.5 MG tablet    Sig: Take 1 tablet (37.5 mg total) by mouth daily before breakfast. Take 1 daily    Dispense:  30 tablet    Refill:  0    Order Specific Question:   Supervising Provider    Answer:   Marie Mills, Marie Mills [[id number]]     2. Body mass index 40.0-44.9, adult (HCC) Continue weight loss  3. Pain and swelling  of right knee RICE it Can use tylenol or advil  Will refer to Marie Mills for evaluation - Ambulatory referral to Orthopedics     Plan:     Follow up in 4 weeks for weight and BP check

## 2021-11-04 ENCOUNTER — Other Ambulatory Visit: Payer: Self-pay | Admitting: Internal Medicine

## 2021-11-12 DIAGNOSIS — J342 Deviated nasal septum: Secondary | ICD-10-CM | POA: Diagnosis not present

## 2021-11-12 DIAGNOSIS — Z8709 Personal history of other diseases of the respiratory system: Secondary | ICD-10-CM | POA: Insufficient documentation

## 2021-11-15 ENCOUNTER — Telehealth: Payer: Self-pay | Admitting: Internal Medicine

## 2021-11-15 NOTE — Telephone Encounter (Signed)
Has ?cyst to come tomorrow at 1:30 pm to be seen

## 2021-11-15 NOTE — Telephone Encounter (Signed)
Patient called in regard to cyst on vag.  Patient is requesting a call back from provider, states that they have spoken before of possibly having removed.   The cyst is getting worse and monthly cycle makes is worse as well

## 2021-11-16 ENCOUNTER — Encounter: Payer: Self-pay | Admitting: Adult Health

## 2021-11-16 ENCOUNTER — Ambulatory Visit: Payer: Medicaid Other | Admitting: Adult Health

## 2021-11-16 VITALS — BP 156/86 | HR 105 | Ht 66.0 in | Wt 264.5 lb

## 2021-11-16 DIAGNOSIS — L720 Epidermal cyst: Secondary | ICD-10-CM

## 2021-11-16 DIAGNOSIS — R300 Dysuria: Secondary | ICD-10-CM | POA: Diagnosis not present

## 2021-11-16 LAB — POCT URINALYSIS DIPSTICK
Glucose, UA: NEGATIVE
Ketones, UA: NEGATIVE
Leukocytes, UA: NEGATIVE
Nitrite, UA: NEGATIVE
Protein, UA: POSITIVE — AB

## 2021-11-16 NOTE — Progress Notes (Signed)
  Subjective:     Patient ID: Marie Mills, female   DOB: May 11, 1970, 51 y.o.   MRN: [id number]  HPI Marie Mills is a 51 year old white female, divorced, [id number], in complaining of burning with urination and csyt on left labia feels bigger and irritated. She has  appt tomorrow with Dr Aline Brochure for her right knee.  Lab Results  Component Value Date   DIAGPAP  02/03/2021    - Negative for intraepithelial lesion or malignancy (NILM)   HPV NOT DETECTED 05/30/2016   [place] Negative 02/03/2021   PCP is Dr Sherrie Sport  Review of Systems +burning with urination Cyst left labia feels bigger and irritated. Reviewed past medical,surgical, social and family history. Reviewed medications and allergies.     Objective:   Physical Exam BP (!) 156/86 (BP Location: Left Arm, Patient Position: Sitting, Cuff Size: Normal)   Pulse (!) 105   Ht '5\' 6"'$  (1.676 m)   Wt 264 lb 8 oz (120 kg)   LMP 10/19/2021   BMI 42.69 kg/m  urine dipstick trace protein and blood. Skin warm and dry.Pelvic: external genitalia is normal in appearance, has 1 cm oval epidermal cyst top inner left labia, vagina: pink, no lesions noted,urethra has no lesions or masses noted, cervix:smooth and bulbous, uterus: normal size, shape and contour, non tender, no masses felt, adnexa: no masses or tenderness noted. Bladder is non tender and no masses felt.  Upstream - 11/16/21 1353       Pregnancy Intention Screening   Does the patient want to become pregnant in the next year? No    Does the patient's partner want to become pregnant in the next year? No    Would the patient like to discuss contraceptive options today? No      Contraception Wrap Up   Current Method Oral Contraceptive    End Method Oral Contraceptive                Examination chaperoned by Levy Pupa LPN Assessment:     1. Burning with urination Will send urine for UA C&S  - POCT Urinalysis Dipstick - Urine Culture - Urinalysis, Routine w reflex microscopic  2.  Epidermal cyst Will get back for Dr Nelda Marseille to do I&D    Plan:     Follow up with Dr Nelda Marseille about 11/25/21 for I&D epidermal cyst left inner labia

## 2021-11-17 ENCOUNTER — Ambulatory Visit: Payer: Medicaid Other | Admitting: Orthopedic Surgery

## 2021-11-17 ENCOUNTER — Ambulatory Visit (INDEPENDENT_AMBULATORY_CARE_PROVIDER_SITE_OTHER): Payer: Medicaid Other

## 2021-11-17 ENCOUNTER — Encounter: Payer: Self-pay | Admitting: Orthopedic Surgery

## 2021-11-17 VITALS — BP 154/93 | HR 86 | Ht 66.0 in | Wt 264.0 lb

## 2021-11-17 DIAGNOSIS — M25561 Pain in right knee: Secondary | ICD-10-CM

## 2021-11-17 LAB — URINALYSIS, ROUTINE W REFLEX MICROSCOPIC
Bilirubin, UA: NEGATIVE
Glucose, UA: NEGATIVE
Ketones, UA: NEGATIVE
Leukocytes,UA: NEGATIVE
Nitrite, UA: NEGATIVE
RBC, UA: NEGATIVE
Specific Gravity, UA: 1.028 (ref 1.005–1.030)
Urobilinogen, Ur: 0.2 mg/dL (ref 0.2–1.0)
pH, UA: 6 (ref 5.0–7.5)

## 2021-11-17 LAB — MICROSCOPIC EXAMINATION
Bacteria, UA: NONE SEEN
Casts: NONE SEEN /lpf
RBC, Urine: NONE SEEN /hpf (ref 0–2)
WBC, UA: NONE SEEN /hpf (ref 0–5)

## 2021-11-17 MED ORDER — SULINDAC 150 MG PO TABS: 150.0000 mg | ORAL_TABLET | Freq: Two times a day (BID) | ORAL | 1 refills | Status: DC

## 2021-11-17 NOTE — Addendum Note (Signed)
Addended byCandice Camp on: 11/17/2021 10:28 AM   Modules accepted: Orders

## 2021-11-17 NOTE — Patient Instructions (Addendum)
Start new medication  Physical therapy  Avoid activities that cause pain  Return 4 weeks  Physical therapy has been ordered for you at [facility]. They should call you to schedule, [phone] is the phone number to call, if you want to call to schedule.

## 2021-11-17 NOTE — Progress Notes (Signed)
Chief Complaint  Patient presents with   Knee Pain    Right    LMP 10/19/2021   This is a 51 year old female presents with atraumatic onset of right knee pain.  She denies any trauma.  Complains of diffuse knee pain anteriorly and posteriorly as well.  There is no localization is in terms of medial or lateral chest diffuse pain all over the joint.  She did receive an anti-inflammatory from primary care she said did not help. IT WAS DICLOFENAC   No other treatment to date  Past Medical History:  Diagnosis Date   BV (bacterial vaginosis) 08/29/2013   Contraceptive management 03/21/2013   GERD (gastroesophageal reflux disease)    Hypertension    Obesity    Vaginal irritation 08/29/2013    Past Surgical History:  Procedure Laterality Date   CHOLECYSTECTOMY     COLONOSCOPY WITH PROPOFOL N/A 07/30/2021   Procedure: COLONOSCOPY WITH PROPOFOL;  Surgeon: Eloise Harman, DO;  [facility];  Service: Endoscopy;  Laterality: N/A;  2:45pm   POLYPECTOMY  07/30/2021   Procedure: POLYPECTOMY;  Surgeon: Eloise Harman, DO;  Location: [facility];  Service: Endoscopy;;   Review of Systems  Cardiovascular:  Positive for leg swelling.  Musculoskeletal:  Positive for back pain and joint pain.  Endo/Heme/Allergies:  Positive for environmental allergies.    Constitutional: Vital signs, General appearance, Body mass index is 42.61 kg/m. The patient meets the AMA guidelines for Morbid (severe) obesity with a BMI > 40.0 and I have recommended weight loss.   Cardiovascular NO swelling NO Varicosities palpation of pulses NRML temperature NRML NO edema tNO enderness  Skin NRML  Neuro NRML sensation  Psych alert and oriented x3  Musculoskeletal gait NRML NO LIMP OBSERVED   RIGHT KNEE  NRML ROM RT HIP   EFFUSION NO  MED AND LAT TENDER  ACL PCL INTACT COLLATERALS INTACT  MOTOR 5/5   XRAYS: Normal alignment on x-ray with mild patellofemoral spur possible patellofemoral  arthritis no fracture  ASSESSMENT AND PLAN   Encounter Diagnosis  Name Primary?   Acute pain of right knee Yes   Meds ordered this encounter  Medications   sulindac (CLINORIL) 150 MG tablet    Sig: Take 1 tablet (150 mg total) by mouth 2 (two) times daily.    Dispense:  30 tablet    Refill:  1    PT  4WEEKS F/U

## 2021-11-19 ENCOUNTER — Telehealth: Payer: Self-pay | Admitting: *Deleted

## 2021-11-19 LAB — URINE CULTURE

## 2021-11-19 NOTE — Telephone Encounter (Signed)
Pt was advised her urine culture didn't show anything that she needs treatment for. Pt states JAG mentioned sending something in if her culture was negative. JAG not in office today but I called her regarding this. Pt was advised can take OTC AZO and continue to push fluids. Pt voiced understanding. [place]

## 2021-11-25 ENCOUNTER — Encounter: Payer: Self-pay | Admitting: Obstetrics & Gynecology

## 2021-11-25 ENCOUNTER — Ambulatory Visit: Payer: Medicaid Other | Admitting: Obstetrics & Gynecology

## 2021-11-25 DIAGNOSIS — N907 Vulvar cyst: Secondary | ICD-10-CM | POA: Diagnosis not present

## 2021-11-25 NOTE — Progress Notes (Signed)
   GYN VISIT Patient name: Marie Mills MRN [id number]  Date of birth: 10-14-1970 Chief Complaint:   I&D of epidermal cyst  History of Present Illness:   Marie Mills is a 51 y.o. [phone] female being seen today for sebaceous cyst.     This has been ongoing for the past year- states it will come and go, but she is sick of dealing with it.  Notes burning and discomfort.  Pt seen by J.Griffin who advised follow up with me regarding removal.  Still having periods- denies HMB or dysmenorrhea.    Patient's last menstrual period was 10/19/2021.     03/05/2021    8:43 AM 02/03/2021    2:55 PM 10/08/2018    9:07 AM 05/31/2017   11:29 AM 05/30/2016    9:45 AM  Depression screen PHQ 2/9  Decreased Interest 0 1 0 0 0  Down, Depressed, Hopeless 0 1 0 0 0  PHQ - 2 Score 0 2 0 0 0  Altered sleeping  2     Tired, decreased energy  3     Change in appetite  2     Feeling bad or failure about yourself   3     Trouble concentrating  0     Moving slowly or fidgety/restless  0     Suicidal thoughts  0     PHQ-9 Score  12        Review of Systems:   Pertinent items are noted in HPI Denies fever/chills, dizziness, headaches, visual disturbances, fatigue, shortness of breath, chest pain, abdominal pain, vomiting, no problems with periods, bowel movements, urination, or intercourse unless otherwise stated above.  Pertinent History Reviewed:  Reviewed past medical,surgical, social, obstetrical and family history.  Reviewed problem list, medications and allergies. Physical Assessment:   Vitals:   11/25/21 1412  BP: (!) 143/86  Pulse: 95  Weight: 263 lb 9.6 oz (119.6 kg)  Height: '5\' 6"'$  (1.676 m)  Body mass index is 42.55 kg/m.       Physical Examination:   General appearance: alert, well appearing, and in no distress  Psych: mood appropriate, normal affect  Skin: warm & dry   Cardiovascular: normal heart rate noted  Respiratory: normal respiratory effort, no distress  Abdomen: soft,  non-tender   Pelvic:  left labia minora with 1.5cm sebaceous cyst noted, no other abnormalities noted  Extremities: no edema   Chaperone: Levy Pupa    Procedure: After informed consent, area cleaned with Betadine.  5 cc of Lidocaine infiltrated.  Scalpel used to make a small incision and sebaceous debris removed from site. Cyst wall grabbed and removed in pieces. Silver nitrazine used for hemostasis.  2 interrupted figure of eight stitches of prolene placed.  Pt tolerated procedure without difficulty   Assessment & Plan:  1) Sebaceous cyst -incision and removal completed -f/u in 1 wk for stitch removal -reviewed precautions  Return in about 1 week (around 12/02/2021).   Janyth Pupa, DO Attending [place], [facility] for [employer], [place]

## 2021-11-26 ENCOUNTER — Ambulatory Visit: Payer: Medicaid Other | Admitting: Adult Health

## 2021-11-30 ENCOUNTER — Ambulatory Visit: Payer: Medicaid Other | Admitting: Obstetrics & Gynecology

## 2021-11-30 ENCOUNTER — Telehealth: Payer: Self-pay

## 2021-11-30 NOTE — Telephone Encounter (Signed)
Patient states she had I&D of labial cyst on 8/10.  Area is draining and seems to be infected and is also having burning.  Advised patient to come in this afternoon for Dr Elonda Husky to assess area.

## 2021-11-30 NOTE — Telephone Encounter (Signed)
LMOVM returning patient's call.  

## 2021-11-30 NOTE — Telephone Encounter (Signed)
Patient called and stated that she had a cyst removed last Thursday by Dr. Nelda Marseille, she thinks it is infected and she also states that it is burning.  Would like for someone to call her with advice on what to do.. She has an appointment on Thursday.

## 2021-12-02 ENCOUNTER — Ambulatory Visit: Payer: Medicaid Other | Admitting: Adult Health

## 2021-12-02 ENCOUNTER — Encounter: Payer: Self-pay | Admitting: Adult Health

## 2021-12-02 VITALS — BP 153/87 | HR 90 | Ht 66.0 in | Wt 267.0 lb

## 2021-12-02 DIAGNOSIS — Z4802 Encounter for removal of sutures: Secondary | ICD-10-CM

## 2021-12-02 DIAGNOSIS — Z713 Dietary counseling and surveillance: Secondary | ICD-10-CM | POA: Diagnosis not present

## 2021-12-02 DIAGNOSIS — Z6841 Body Mass Index (BMI) 40.0 and over, adult: Secondary | ICD-10-CM | POA: Diagnosis not present

## 2021-12-02 MED ORDER — PHENTERMINE HCL 37.5 MG PO TABS: 37.5000 mg | ORAL_TABLET | Freq: Every day | ORAL | 0 refills | Status: DC

## 2021-12-02 NOTE — Progress Notes (Signed)
  Subjective:     Patient ID: Marie Mills, female   DOB: Aug 01, 1970, 51 y.o.   MRN: [id number]  HPI Marie Mills is a 51 year old white female,divorced, [id number] in for weight and BP check  and have sutures removed had sebaceous cyst removed last week. She says it itches and burns. She saw Dr Aline Brochure about right knee and has been on steroids. Starting PT right knee end of the month, has to do before getting MRI, she says. She says knee feels better.  Lab Results  Component Value Date   DIAGPAP  02/03/2021    - Negative for intraepithelial lesion or malignancy (NILM)   HPV NOT DETECTED 05/30/2016   [name] Negative 02/03/2021   PCP is Dr Sherrie Sport.  Review of Systems Has itching and burning at suture site Has gained weight with steroids Reviewed past medical,surgical, social and family history. Reviewed medications and allergies.     Objective:   Physical Exam BP (!) 153/87 (BP Location: Left Arm, Patient Position: Sitting, Cuff Size: Normal)   Pulse 90   Ht '5\' 6"'$  (1.676 m)   Wt 267 lb (121.1 kg)   BMI 43.09 kg/m     Skin warm and dry.  Lungs: clear to ausculation bilaterally. Cardiovascular: regular rate and rhythm.  Pelvic: external genitalia is normal in appearance, left inner labia has 2 sutures, clipped and easily removed, area open now, and pink with some exudate, triple antibiotic cream applied. Fall risk is low  Upstream - 12/02/21 1103       Pregnancy Intention Screening   Does the patient want to become pregnant in the next year? No    Does the patient's partner want to become pregnant in the next year? No    Would the patient like to discuss contraceptive options today? No      Contraception Wrap Up   Current Method Oral Contraceptive    End Method Oral Contraceptive            Examination chaperoned by Levy Pupa LPN  Assessment:      1. Weight loss counseling, encounter for Will refill adipex Meds ordered this encounter  Medications   phentermine (ADIPEX-P)  37.5 MG tablet    Sig: Take 1 tablet (37.5 mg total) by mouth daily before breakfast. Take 1 daily    Dispense:  30 tablet    Refill:  0    Order Specific Question:   Supervising Provider    Answer:   EURE, LUTHER H [[id number]]   Recheck weight and BP in 4 weeks   2. Body mass index 40.0-44.9, adult ([place])  3. Visit for suture removal Sutures removed Triple antibiotic cream applied Keep clean and apply cream 2-3 x daily, do not rub Will recheck next week     Plan:     Follow up in about 6 days for recheck

## 2021-12-03 ENCOUNTER — Ambulatory Visit: Payer: Medicaid Other | Admitting: Adult Health

## 2021-12-06 ENCOUNTER — Encounter: Payer: Self-pay | Admitting: Adult Health

## 2021-12-06 ENCOUNTER — Other Ambulatory Visit: Payer: Self-pay | Admitting: Adult Health

## 2021-12-06 ENCOUNTER — Ambulatory Visit: Payer: Medicaid Other | Admitting: Adult Health

## 2021-12-06 ENCOUNTER — Telehealth: Payer: Self-pay | Admitting: Adult Health

## 2021-12-06 VITALS — BP 146/90 | HR 87 | Ht 66.0 in | Wt 267.0 lb

## 2021-12-06 DIAGNOSIS — R300 Dysuria: Secondary | ICD-10-CM

## 2021-12-06 DIAGNOSIS — N949 Unspecified condition associated with female genital organs and menstrual cycle: Secondary | ICD-10-CM | POA: Diagnosis not present

## 2021-12-06 DIAGNOSIS — B369 Superficial mycosis, unspecified: Secondary | ICD-10-CM | POA: Insufficient documentation

## 2021-12-06 DIAGNOSIS — N898 Other specified noninflammatory disorders of vagina: Secondary | ICD-10-CM | POA: Insufficient documentation

## 2021-12-06 DIAGNOSIS — N9489 Other specified conditions associated with female genital organs and menstrual cycle: Secondary | ICD-10-CM

## 2021-12-06 DIAGNOSIS — B3731 Acute candidiasis of vulva and vagina: Secondary | ICD-10-CM

## 2021-12-06 LAB — POCT URINALYSIS DIPSTICK
Glucose, UA: NEGATIVE
Ketones, UA: NEGATIVE
Leukocytes, UA: NEGATIVE
Nitrite, UA: NEGATIVE
Protein, UA: POSITIVE — AB

## 2021-12-06 LAB — POCT WET PREP (WET MOUNT)

## 2021-12-06 MED ORDER — NYSTATIN 100000 UNIT/GM EX CREA: 1.0000 | TOPICAL_CREAM | Freq: Two times a day (BID) | CUTANEOUS | 3 refills | Status: DC

## 2021-12-06 MED ORDER — NYSTATIN 100000 UNIT/GM EX OINT: 1.0000 | TOPICAL_OINTMENT | Freq: Two times a day (BID) | CUTANEOUS | 0 refills | Status: DC

## 2021-12-06 MED ORDER — FLUCONAZOLE 150 MG PO TABS: ORAL_TABLET | ORAL | 2 refills | Status: DC

## 2021-12-06 MED ORDER — CIPROFLOXACIN HCL 500 MG PO TABS: 500.0000 mg | ORAL_TABLET | Freq: Two times a day (BID) | ORAL | 0 refills | Status: DC

## 2021-12-06 NOTE — Telephone Encounter (Signed)
Will change to , nystatin cream

## 2021-12-06 NOTE — Telephone Encounter (Addendum)
Pt has vaginal burning. Pt states it's bad. Back and sides hurt. Pt feels like it's a yeast infection. Burning with urination and itching. Pt can't walk and don't feel like she can come in. Please advise. Thanks! [place]

## 2021-12-06 NOTE — Telephone Encounter (Signed)
Pt states her insurance will not cover nystatin ointment is there anything else medicaid would cover.

## 2021-12-06 NOTE — Telephone Encounter (Signed)
The patient states that she is burning and thinks that she might have an infection and states that she can barely walk wants someone to call her.

## 2021-12-06 NOTE — Addendum Note (Signed)
Addended by: Linton Rump on: 12/06/2021 10:40 AM   Modules accepted: Orders

## 2021-12-06 NOTE — Progress Notes (Signed)
Change to nystatin cream

## 2021-12-06 NOTE — Progress Notes (Signed)
Subjective:     Patient ID: Marie Mills, female   DOB: 10/26/1970, 51 y.o.   MRN: [id number]  HPI [name] is a 51 year old white female, divorced, [phone], worked in for complaints of vaginal irritation, burning and itching and burns to pee, also has low back pain on left. And has spot under right breast that burns. Lab Results  Component Value Date   DIAGPAP  02/03/2021    - Negative for intraepithelial lesion or malignancy (NILM)   HPV NOT DETECTED 05/30/2016   [place] Negative 02/03/2021   PCP is Dr Sherrie Sport.   Review of Systems +vaginal irritation, burning and itching and burns to pee, also has low back pain on left.  And has spot under right breast that burns.    Objective:   Physical Exam BP (!) 146/90 (BP Location: Left Arm, Patient Position: Sitting, Cuff Size: Normal)   Pulse 87   Ht '5\' 6"'$  (1.676 m)   Wt 267 lb (121.1 kg)   LMP 12/02/2021   BMI 43.09 kg/m  urine 3+ blood and trace protein.   Skin warm and dry.Pelvic: external genitalia is normal in appearance, area left inner labia much smaller, almost closed, vagina: pink discharge without odor,urethra has no lesions or masses noted, cervix:smooth and bulbous, uterus: normal size, shape and contour, non tender, no masses felt, adnexa: no masses or tenderness noted. Bladder is non tender and no masses felt. Wet prep: + for yeast and +WBCs., has skin fungus under right breast, red and irritated. Painted vulva and vagina with gentian violet  Upstream - 12/06/21 1017       Pregnancy Intention Screening   Does the patient want to become pregnant in the next year? No    Does the patient's partner want to become pregnant in the next year? No    Would the patient like to discuss contraceptive options today? No      Contraception Wrap Up   Current Method Oral Contraceptive    End Method Oral Contraceptive    Contraception Counseling Provided No             Examination chaperoned by Levy Pupa LPN  Assessment:      1. Burning with urination Urine was +blood and protein Increase water UA C&S sent, suspected UTI  Will rx Cipro 500 mg 1 bid x 3 days   2. Vaginal irritation Painted with gentian violet   3. Vaginal itching Painted with gentian violet   4. Vaginal burning Painted with gentian violet   5. Vaginal yeast infection Painted with gentian violet  Will rx diflucan  6. Superficial fungus infection of skin Red under right breast Will rx diflucan and rx nystatin ointment   Meds ordered this encounter  Medications   fluconazole (DIFLUCAN) 150 MG tablet    Sig: Take 1 now and 1 in 3 days    Dispense:  2 tablet    Refill:  2    Order Specific Question:   Supervising Provider    Answer:   Tania Ade H [[id number]]   ciprofloxacin (CIPRO) 500 MG tablet    Sig: Take 1 tablet (500 mg total) by mouth 2 (two) times daily.    Dispense:  6 tablet    Refill:  0    Order Specific Question:   Supervising Provider    Answer:   Tania Ade H [[id number]]   nystatin ointment (MYCOSTATIN)    Sig: Apply 1 Application topically 2 (two) times daily.    Dispense:  30 g    Refill:  0    Order Specific Question:   Supervising Provider    Answer:   Florian Buff [[id number]]       Plan:    Note given to be off till 12/09/21 was already off Wednesday  Will recheck in 2 days already has appt.

## 2021-12-06 NOTE — Telephone Encounter (Signed)
Burning and back hurts can hardly walk, to come in now

## 2021-12-07 LAB — MICROSCOPIC EXAMINATION
Bacteria, UA: NONE SEEN
Casts: NONE SEEN /lpf
RBC, Urine: NONE SEEN /hpf (ref 0–2)
WBC, UA: NONE SEEN /hpf (ref 0–5)

## 2021-12-07 LAB — URINALYSIS, ROUTINE W REFLEX MICROSCOPIC
Bilirubin, UA: NEGATIVE
Glucose, UA: NEGATIVE
Ketones, UA: NEGATIVE
Nitrite, UA: NEGATIVE
RBC, UA: NEGATIVE
Specific Gravity, UA: 1.026 (ref 1.005–1.030)
Urobilinogen, Ur: 0.2 mg/dL (ref 0.2–1.0)
pH, UA: 5.5 (ref 5.0–7.5)

## 2021-12-08 ENCOUNTER — Encounter: Payer: Self-pay | Admitting: Adult Health

## 2021-12-08 ENCOUNTER — Ambulatory Visit: Payer: Medicaid Other | Admitting: Adult Health

## 2021-12-08 VITALS — BP 148/90 | HR 83 | Ht 66.0 in | Wt 260.0 lb

## 2021-12-08 DIAGNOSIS — N949 Unspecified condition associated with female genital organs and menstrual cycle: Secondary | ICD-10-CM

## 2021-12-08 DIAGNOSIS — N898 Other specified noninflammatory disorders of vagina: Secondary | ICD-10-CM | POA: Diagnosis not present

## 2021-12-08 LAB — URINE CULTURE

## 2021-12-08 NOTE — Progress Notes (Signed)
  Subjective:     Patient ID: Marie Mills, female   DOB: 23-Jul-1970, 51 y.o.   MRN: [id number]  HPI Marie Mills is a 51 year old white female, divorced, [id number] in for recheck of having burning and itching feels much better.  PCP is Dr Sherrie Sport.  Last pap normal with negative HPV 02/03/21    Review of Systems Still some burning and itching but much better Reviewed past medical,surgical, social and family history. Reviewed medications and allergies.     Objective:   Physical Exam BP (!) 148/90 (BP Location: Left Arm, Patient Position: Sitting, Cuff Size: Normal)   Pulse 83   Ht '5\' 6"'$  (1.676 m)   Wt 260 lb (117.9 kg)   LMP 12/02/2021   BMI 41.97 kg/m     Skin warm and dry.Pelvic: external genitalia is normal in appearance, area left inner labia is smaller, vagina: pink tissue, red at introitus,urethra has no lesions or masses noted, cervix:smooth and bulbous, uterus: normal size, shape and contour, non tender, no masses felt, adnexa: no masses or tenderness noted. Bladder is non tender and no masses felt. Painted with gentian violet at clitoris area and introitus. Examination chaperoned by Levy Pupa LPN  Upstream - 49/09/32 1054       Pregnancy Intention Screening   Does the patient want to become pregnant in the next year? No    Does the patient's partner want to become pregnant in the next year? No    Would the patient like to discuss contraceptive options today? No      Contraception Wrap Up   Current Method Oral Contraceptive    End Method Oral Contraceptive    Contraception Counseling Provided No              Assessment:     1. Vaginal itching Much better Painted with gentian violet Take another diflucan tomorrow  2. Vaginal burning Much better     Plan:     Follow up as scheduled for weight check

## 2021-12-10 ENCOUNTER — Telehealth: Payer: Self-pay | Admitting: Adult Health

## 2021-12-10 NOTE — Telephone Encounter (Signed)
Pt states she still has burning and irritation with urination. Pt has finished antibiotic. She took one round of Diflucan. Has a refill on Diflucan. Pt was advised can give it over the weekend and see if she gets any better. She may want to refill Diflucan. If things don't improve over the weekend, she may need to be seen at Urgent Care. Pt voiced understanding. [place]

## 2021-12-10 NOTE — Telephone Encounter (Signed)
Pt is still burning and has irritation when she urinates. Please advise

## 2021-12-13 ENCOUNTER — Telehealth: Payer: Self-pay

## 2021-12-13 NOTE — Telephone Encounter (Signed)
Pt is still having itching and burning. She has noticed blood when she wipes. She has taken 3 rounds of Diflucan and it's not time for her period. Jenn offered to work pt in at 2:50 this afternoon but pt was unable to come then. She will be worked in at 8:30 tomorrow morning. Pt voiced understanding. [place]

## 2021-12-13 NOTE — Telephone Encounter (Signed)
Patient called and stated that she is still having itching and burning, pt also stated that she is now seeing blood when she wipes.  Patient would like for a nurse to call her.

## 2021-12-13 NOTE — Telephone Encounter (Signed)
Marie Mills spoke with her today and appt made for tomorrow at 8:30 am

## 2021-12-14 ENCOUNTER — Encounter: Payer: Self-pay | Admitting: Adult Health

## 2021-12-14 ENCOUNTER — Ambulatory Visit: Payer: Medicaid Other | Admitting: Adult Health

## 2021-12-14 VITALS — BP 136/88 | HR 83 | Ht 66.0 in | Wt 260.0 lb

## 2021-12-14 DIAGNOSIS — T148XXA Other injury of unspecified body region, initial encounter: Secondary | ICD-10-CM | POA: Insufficient documentation

## 2021-12-14 DIAGNOSIS — N949 Unspecified condition associated with female genital organs and menstrual cycle: Secondary | ICD-10-CM

## 2021-12-14 DIAGNOSIS — R300 Dysuria: Secondary | ICD-10-CM | POA: Diagnosis not present

## 2021-12-14 DIAGNOSIS — N898 Other specified noninflammatory disorders of vagina: Secondary | ICD-10-CM

## 2021-12-14 DIAGNOSIS — N9489 Other specified conditions associated with female genital organs and menstrual cycle: Secondary | ICD-10-CM

## 2021-12-14 LAB — POCT URINALYSIS DIPSTICK
Glucose, UA: NEGATIVE
Ketones, UA: NEGATIVE
Nitrite, UA: NEGATIVE
Protein, UA: POSITIVE — AB

## 2021-12-14 MED ORDER — DOXYCYCLINE HYCLATE 100 MG PO TABS: 100.0000 mg | ORAL_TABLET | Freq: Two times a day (BID) | ORAL | 0 refills | Status: DC

## 2021-12-14 MED ORDER — VALACYCLOVIR HCL 1 G PO TABS: 1000.0000 mg | ORAL_TABLET | Freq: Two times a day (BID) | ORAL | 1 refills | Status: DC

## 2021-12-14 NOTE — Progress Notes (Signed)
  Subjective:     Patient ID: Marie Mills, female   DOB: 21-Dec-1970, 51 y.o.   MRN: [id number]  HPI Marie Mills is a 51 year old white female,divorced, [id number] in complaining of burning when pees and vaginal itching and irritation. Was better now worse again. PCP is Dr Sherrie Sport  Last pap normal with negative HPV 02/03/21   Review of Systems Has vaginal burning and itching and irritation Really burns with urination Reviewed past medical,surgical, social and family history. Reviewed medications and allergies.     Objective:   Physical Exam BP 136/88 (BP Location: Left Arm, Patient Position: Sitting, Cuff Size: Normal)   Pulse 83   Ht '5\' 6"'$  (1.676 m)   Wt 260 lb (117.9 kg)   LMP 12/02/2021   BMI 41.97 kg/m  urine dipstick 1+blood, trace leuks and protein.   Skin warm and dry.Pelvic: external genitalia is normal in appearance, area left inner labia is smaller, inner labia has kissing areas of excoriation, not ulcer but irritated, HSV culture obtained,vagina: pink,urethra has no lesions or masses noted, cervix:smooth and bulbous, uterus: normal size, shape and contour, non tender, no masses felt, adnexa: no masses or tenderness noted. Bladder is non tender and no masses felt.  Examination chaperoned by Levy Pupa LPN    Upstream - 93/81/01 1050       Pregnancy Intention Screening   Does the patient's partner want to become pregnant in the next year? No    Would the patient like to discuss contraceptive options today? No      Contraception Wrap Up   Current Method Oral Contraceptive    End Method Oral Contraceptive    Contraception Counseling Provided No             Assessment:     1. Burning with urination I expect the burning is when the urine hits inner labia Can use peri bottle with warm water and apply aquaphor   2. Vaginal burning  3. Vaginal irritation  4. Vaginal itching Take a diflucan today or tomorrow  5. Skin excoriation HSV culture obtained   Will rx  valtrex  1 gm bid and doxycycline 100 mg 1 bid  Meds ordered this encounter  Medications   doxycycline (VIBRA-TABS) 100 MG tablet    Sig: Take 1 tablet (100 mg total) by mouth 2 (two) times daily.    Dispense:  14 tablet    Refill:  0    Order Specific Question:   Supervising Provider    Answer:   Tania Ade H [[id number]]   valACYclovir (VALTREX) 1000 MG tablet    Sig: Take 1 tablet (1,000 mg total) by mouth 2 (two) times daily.    Dispense:  20 tablet    Refill:  1    Order Specific Question:   Supervising Provider    Answer:   Florian Buff [[id number]]    Plan:     Follow up in 3 days for recheck

## 2021-12-15 ENCOUNTER — Ambulatory Visit: Payer: Medicaid Other | Admitting: Orthopedic Surgery

## 2021-12-15 NOTE — Therapy (Unsigned)
OUTPATIENT PHYSICAL THERAPY LOWER EXTREMITY EVALUATION   Patient Name: Marie Mills MRN: [id number] DOB:May 08, 1970, 51 y.o., female Today's Date: 12/16/2021   PT End of Session - 12/16/21 1437     Visit Number 1    Number of Visits 12    Date for PT Re-Evaluation 01/27/22    Authorization Type  Medicaid [employer]- authorization put in    PT Start Time 1345    PT Stop Time 1420    PT Time Calculation (min) 35 min    Equipment Utilized During Treatment Gait belt    Activity Tolerance Patient tolerated treatment well;No increased pain    Behavior During Therapy [facility] for tasks assessed/performed             Past Medical History:  Diagnosis Date   BV (bacterial vaginosis) 08/29/2013   Contraceptive management 03/21/2013   GERD (gastroesophageal reflux disease)    Hypertension    Obesity    Vaginal irritation 08/29/2013   Past Surgical History:  Procedure Laterality Date   CHOLECYSTECTOMY     COLONOSCOPY WITH PROPOFOL N/A 07/30/2021   Procedure: COLONOSCOPY WITH PROPOFOL;  Surgeon: Eloise Harman, DO;  [facility];  Service: Endoscopy;  Laterality: N/A;  2:45pm   POLYPECTOMY  07/30/2021   Procedure: POLYPECTOMY;  Surgeon: Eloise Harman, DO;  [facility];  Service: Endoscopy;;   Patient Active Problem List   Diagnosis Date Noted   Skin excoriation 12/14/2021   Superficial fungus infection of skin 12/06/2021   Vaginal yeast infection 12/06/2021   Vaginal itching 12/06/2021   Vaginal burning 12/06/2021   Visit for suture removal 12/02/2021   Burning with urination 11/16/2021   Pain and swelling of right knee 10/29/2021   Encounter for screening colonoscopy 06/29/2021   Screening examination for STD (sexually transmitted disease) 05/06/2021   Epidermal cyst 05/06/2021   Screening mammogram for breast cancer 02/03/2021   Routine general medical examination at a health care facility 02/03/2021   Encounter for gynecological examination with  Papanicolaou smear of cervix 02/03/2021   Encounter for surveillance of contraceptive pills 02/03/2021   Anxiety and depression 02/03/2021   Weight gain 07/04/2019   Body mass index 39.0-39.9, adult 05/28/2019   Body mass index 37.0-37.9, adult 04/08/2019   Body mass index 38.0-38.9, adult 02/04/2019   Body mass index 40.0-44.9, adult ([place]) 11/01/2018   Encounter for well woman exam with routine gynecological exam 10/08/2018   Weight loss counseling, encounter for 10/08/2018   Body mass index 45.0-49.9, adult ([place]) 10/08/2018   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 present 05/30/2016   Surveillance for birth control, oral contraceptives 05/30/2016   Family planning 05/30/2016   Screening for colorectal cancer 05/30/2016   Vaginal irritation 08/29/2013   BV (bacterial vaginosis) 08/29/2013   Essential hypertension 03/21/2013   Contraceptive management 03/21/2013    PCP: Stoney Bang  REFERRING PROVIDER: Arther Abbott   REFERRING DIAG: Rt knee pain   THERAPY DIAG:  Right knee pain. Rationale for Evaluation and Treatment Rehabilitation  ONSET DATE: 09/16/2021  SUBJECTIVE:   SUBJECTIVE STATEMENT: Pt states no injury or trauma Rt knee has been having progressive anterior pain which causes her knee to give way.  She has not fallen and it only gives way once in a while.  She has increased pain with steps.  She has to hold onto the rail and at times goes one at a time.   She has four steps into her home. She can squat down  but is unable to return to standing without assist of her arms.  She can walk for 30-40 minutes before she has increased pain.   PERTINENT HISTORY: N/A  PAIN:  Are you having pain? Yes: NPRS scale: 4/10, worst is a 10/10  Pain location: anterior  Pain description: tight feels like it is going to 'Pop" Aggravating factors: steps, squatting and walking  Relieving factors: ice and new  medication   PRECAUTIONS: None  WEIGHT BEARING RESTRICTIONS  No  FALLS:  Has patient fallen in last 6 months? No  LIVING ENVIRONMENT: Lives with: lives with their family Lives in: House/apartment Stairs: Yes: External: 4 steps; on right going up Has following equipment at home: None  OCCUPATION: nurse aide.   PLOF: Independent  PATIENT GOALS Less pain    OBJECTIVE:   DIAGNOSTIC FINDINGS: XRAYS: Normal alignment on x-ray with mild patellofemoral spur possible patellofemoral arthritis no fracture     COGNITION:  Overall cognitive status: Within functional limits for tasks assessed      EDEMA:  Mid knee circumferential is equal but noted swelling inferior medial knee.   MUSCLE LENGTH: Hamstrings: Right 150 deg; Left 160 deg POSTURE: No Significant postural limitations  PALPATION: No tenderness   LOWER EXTREMITY ROM: wfl  LOWER EXTREMITY MMT:  MMT Right eval Left eval  Hip flexion 5 4+   Hip extension 4+ 4-  Hip abduction 5 5  Hip adduction    Hip internal rotation    Hip external rotation    Knee flexion 5 5  Knee extension 4+ 5  Ankle dorsiflexion 5 4  Ankle plantarflexion    Ankle inversion    Ankle eversion     (Blank rows = not tested)   FUNCTIONAL TESTS:  30 seconds chair stand test:  30"  10x  pt is not in poor category for her age and sex  2 minute walk test: 420 in 2 minutes  Single leg stance: Lt: 60"; RT: 37"     TODAY'S TREATMENT: Evaluation:  SLR,x10                     sit to stand x 10                     hamstring stretch 30" x 2    PATIENT EDUCATION:  Education details:  HEP Person educated: Patient Education method: Theatre stage manager Education comprehension: verbalized understanding and returned demonstration   HOME EXERCISE PROGRAM:[id number] SLR Hamstring stretch  Sit to stand  ASSESSMENT:  CLINICAL IMPRESSION: Patient is a 51 y.o. female  who was seen today for physical therapy evaluation and treatment for Rt knee pain.  Evaluation demonstrated decreased activity  tolerance, decreased strength, increased edema and increased pain.  Pt will benefit from skilled PT to address these issues and maximize her functional ability. .    OBJECTIVE IMPAIRMENTS decreased activity tolerance, decreased balance, difficulty walking, decreased strength, increased edema, obesity, and pain.   ACTIVITY LIMITATIONS lifting, bending, stairs, and locomotion level  PARTICIPATION LIMITATIONS: cleaning, shopping, community activity, occupation, and yard work  PERSONAL FACTORS Fitness and 1 comorbidity: sciatica  are also affecting patient's functional outcome.   REHAB POTENTIAL: Good  CLINICAL DECISION MAKING: Stable/uncomplicated  EVALUATION COMPLEXITY: Low   GOALS: Goals reviewed with patient? No  SHORT TERM GOALS: Target date: 01/06/2022  Pt to be I in HEP for RT knee pain to be no greater than a 6/10  Baseline: Goal status: INITIAL  2.  PT to be able to go up and down steps in a reciprocal manner.  Baseline:  Goal status: INITIAL    LONG TERM GOALS: Target date: 01/27/2022   PT to be I in advanced HEP to allow pain to decrease to no greater than a 2/10 Baseline:  Goal status: INITIAL  2.  Pt to be able to be standing/walking for 2 hours at a time without increased pain for work/shopping activities. Baseline:  Goal status: INITIAL  3.  PT to be able to rise from a squatted position to pick items off the floor without straining her back  Baseline:  Goal status: INITIAL   PLAN: PT FREQUENCY: 2x/week  PT DURATION: 6 weeks  PLANNED INTERVENTIONS: Therapeutic exercises, Therapeutic activity, Patient/Family education, Self Care, and Manual therapy  PLAN FOR NEXT SESSION: Begin functional strengthening exercises, stretches to decrease Rt knee pain.

## 2021-12-16 ENCOUNTER — Ambulatory Visit (HOSPITAL_COMMUNITY): Payer: Medicaid Other | Attending: Orthopedic Surgery | Admitting: Physical Therapy

## 2021-12-16 DIAGNOSIS — M25561 Pain in right knee: Secondary | ICD-10-CM | POA: Diagnosis not present

## 2021-12-16 DIAGNOSIS — M6281 Muscle weakness (generalized): Secondary | ICD-10-CM | POA: Insufficient documentation

## 2021-12-16 LAB — HERPES SIMPLEX VIRUS CULTURE

## 2021-12-17 ENCOUNTER — Ambulatory Visit: Payer: Medicaid Other | Admitting: Adult Health

## 2021-12-17 ENCOUNTER — Encounter: Payer: Self-pay | Admitting: Adult Health

## 2021-12-17 VITALS — BP 147/92 | HR 71 | Ht 66.0 in | Wt 259.5 lb

## 2021-12-17 DIAGNOSIS — N898 Other specified noninflammatory disorders of vagina: Secondary | ICD-10-CM | POA: Diagnosis not present

## 2021-12-17 NOTE — Progress Notes (Signed)
  Subjective:     Patient ID: Marie Mills, female   DOB: 03-Feb-1971, 51 y.o.   MRN: [id number]  HPI Marie Mills is a 51 year old white female,divorced,[id number]. Back in follow up on itching, and irritation when peeing, and is much better. Was treated 12/14/21 with valtrex and doxycyline. HSV culture was negative.    Last pap normal with negative HPV 02/03/21   PCP is Dr Sherrie Sport  Review of Systems Feels much better, no pain,itching or burning Reviewed past medical,surgical, social and family history. Reviewed medications and allergies.     Objective:   Physical Exam BP (!) 147/92 (BP Location: Left Arm, Patient Position: Sitting, Cuff Size: Normal)   Pulse 71   Ht '5\' 6"'$  (1.676 m)   Wt 259 lb 8 oz (117.7 kg)   LMP 12/02/2021   BMI 41.88 kg/m   Skin warm and dry.Pelvic: external genitalia is normal in appearance, area left labia just a dimple now, where cyst removed. The areas of excoriation inner labia's has resolved.    Fall risk is low  Upstream - 12/17/21 0849       Pregnancy Intention Screening   Does the patient want to become pregnant in the next year? No    Does the patient's partner want to become pregnant in the next year? No    Would the patient like to discuss contraceptive options today? No      Contraception Wrap Up   Current Method Oral Contraceptive    End Method Oral Contraceptive            Examination chaperoned by Levy Pupa LPN  Assessment:     1. Vaginal irritation HSV culture was negative She is feeling much better Finish doxycycline and valtrex     Plan:    Note given to excuse 8/30 and 8/31 from work will return tonight  Follow up as scheduled

## 2021-12-24 DIAGNOSIS — R519 Headache, unspecified: Secondary | ICD-10-CM | POA: Insufficient documentation

## 2021-12-24 DIAGNOSIS — J329 Chronic sinusitis, unspecified: Secondary | ICD-10-CM | POA: Diagnosis not present

## 2021-12-24 DIAGNOSIS — J342 Deviated nasal septum: Secondary | ICD-10-CM | POA: Diagnosis not present

## 2021-12-29 ENCOUNTER — Ambulatory Visit
Admission: RE | Admit: 2021-12-29 | Discharge: 2021-12-29 | Disposition: A | Discharge status: Home / Self Care | Payer: Medicaid Other | Source: Ambulatory Visit | Attending: Family Medicine | Admitting: Family Medicine

## 2021-12-29 VITALS — BP 142/102 | HR 62 | Temp 98.0°F | Resp 18

## 2021-12-29 DIAGNOSIS — M5432 Sciatica, left side: Secondary | ICD-10-CM | POA: Diagnosis not present

## 2021-12-29 MED ORDER — PREDNISONE 10 MG PO TABS: ORAL_TABLET | ORAL | 0 refills | Status: DC

## 2021-12-29 NOTE — ED Triage Notes (Signed)
Pt present back & left leg pain, symptoms started two days ago but has gotten intensive worst.

## 2021-12-29 NOTE — ED Provider Notes (Signed)
[facility]    CSN: [id number] Arrival date & time: 12/29/21  0957      History   Chief Complaint Chief Complaint  Patient presents with   Back Pain    Leg pain and having spasms on the left side . Can't hardly walk . - Entered by patient    HPI Marie Mills is a 51 y.o. female.   Patient presenting today with 2-day history of left low back pain radiating down to her hip and down the left leg.  States the pain is progressively worsening and the area feels stiff, tight and when she tries to ambulate sharp pains go down to the back of her leg.  Denies bowel or bladder incontinence, leg weakness, numbness, tingling, loss of range of motion, known injury to the area, saddle anesthesia.  History of off-and-on sciatica issues in the past.  Taking a muscle relaxer, ibuprofen with no relief.  States she is finishing a prednisone taper for a sinus infection and is on the last day or so of that but has not taken her dose for the day.   Past Medical History:  Diagnosis Date   BV (bacterial vaginosis) 08/29/2013   Contraceptive management 03/21/2013   GERD (gastroesophageal reflux disease)    Hypertension    Obesity    Vaginal irritation 08/29/2013   Patient Active Problem List   Diagnosis Date Noted   Skin excoriation 12/14/2021   Superficial fungus infection of skin 12/06/2021   Vaginal yeast infection 12/06/2021   Vaginal itching 12/06/2021   Vaginal burning 12/06/2021   Visit for suture removal 12/02/2021   Burning with urination 11/16/2021   Pain and swelling of right knee 10/29/2021   Encounter for screening colonoscopy 06/29/2021   Screening examination for STD (sexually transmitted disease) 05/06/2021   Epidermal cyst 05/06/2021   Screening mammogram for breast cancer 02/03/2021   Routine general medical examination at a health care facility 02/03/2021   Encounter for gynecological examination with Papanicolaou smear of cervix 02/03/2021   Encounter for  surveillance of contraceptive pills 02/03/2021   Anxiety and depression 02/03/2021   Weight gain 07/04/2019   Body mass index 39.0-39.9, adult 05/28/2019   Body mass index 37.0-37.9, adult 04/08/2019   Body mass index 38.0-38.9, adult 02/04/2019   Body mass index 40.0-44.9, adult ([place]) 11/01/2018   Encounter for well woman exam with routine gynecological exam 10/08/2018   Weight loss counseling, encounter for 10/08/2018   Body mass index 45.0-49.9, adult ([place]) 10/08/2018   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 present 05/30/2016   Surveillance for birth control, oral contraceptives 05/30/2016   Family planning 05/30/2016   Screening for colorectal cancer 05/30/2016   Vaginal irritation 08/29/2013   BV (bacterial vaginosis) 08/29/2013   Essential hypertension 03/21/2013   Contraceptive management 03/21/2013   Past Surgical History:  Procedure Laterality Date   CHOLECYSTECTOMY     COLONOSCOPY WITH PROPOFOL N/A 07/30/2021   Procedure: COLONOSCOPY WITH PROPOFOL;  Surgeon: Eloise Harman, DO;  [facility];  Service: Endoscopy;  Laterality: N/A;  2:45pm   POLYPECTOMY  07/30/2021   Procedure: POLYPECTOMY;  Surgeon: Eloise Harman, DO;  [facility];  Service: Endoscopy;;   OB History     Gravida  4   Para  3   Term  3   Preterm      AB  1   Living  3      SAB      IAB  1   Ectopic      Multiple      Live Births  3           Home Medications    Prior to Admission medications   Medication Sig Start Date End Date Taking? Authorizing Provider  predniSONE (DELTASONE) 10 MG tablet Take 6 tabs day one, 5 tabs day two, 4 tabs day three, etc 12/29/21  Yes Volney American, PA-C  amLODipine (NORVASC) 2.5 MG tablet TAKE 1 TABLET(2.5 MG) BY MOUTH DAILY 10/18/21   Fay Records, MD  aspirin EC 81 MG tablet Take 81 mg by mouth daily. Swallow whole.    [provider]  azelastine (ASTELIN) 0.1 % nasal spray Place 2 sprays into both  nostrils 2 (two) times daily. Use in each nostril as directed 08/09/21   Leath-Warren, Alda Lea, NP  budesonide (PULMICORT) 1 MG/2ML nebulizer solution Take 1 mg by nebulization daily. 09/08/21   [provider]  cetirizine (ZYRTEC) 10 MG tablet Take 1 tablet (10 mg total) by mouth daily. 08/09/21   Leath-Warren, Alda Lea, NP  doxycycline (VIBRA-TABS) 100 MG tablet Take 1 tablet (100 mg total) by mouth 2 (two) times daily. 12/14/21   Estill Dooms, NP  fluticasone (FLONASE) 50 MCG/ACT nasal spray Place 1 spray into both nostrils daily.    [provider]  ipratropium (ATROVENT) 0.06 % nasal spray Place 2 sprays into both nostrils 4 (four) times daily.    [provider]  losartan (COZAAR) 50 MG tablet Take 50 mg by mouth daily. 09/08/21   [provider]  methocarbamol (ROBAXIN) 500 MG tablet Take 500 mg by mouth daily.    [provider]  metoprolol succinate (TOPROL-XL) 25 MG 24 hr tablet Take 25 mg by mouth daily. 04/23/19   [provider]  montelukast (SINGULAIR) 10 MG tablet Take 10 mg by mouth daily. 05/03/21   [provider]  Norgestimate-Ethinyl Estradiol Triphasic (TRI-SPRINTEC) 0.18/0.215/0.25 MG-35 MCG tablet Take 1 tablet by mouth daily. 02/03/21   Estill Dooms, NP  nystatin cream (MYCOSTATIN) Apply 1 Application topically 2 (two) times daily. 12/06/21   Estill Dooms, NP  omeprazole (PRILOSEC) 40 MG capsule TK 1 C PO QD Patient taking differently: Take 40 mg by mouth daily. 03/04/19   Estill Dooms, NP  phentermine (ADIPEX-P) 37.5 MG tablet Take 1 tablet (37.5 mg total) by mouth daily before breakfast. Take 1 daily 12/02/21   Estill Dooms, NP  sulindac (CLINORIL) 150 MG tablet Take 1 tablet (150 mg total) by mouth 2 (two) times daily. 11/17/21   Carole Civil, MD  valACYclovir (VALTREX) 1000 MG tablet Take 1 tablet (1,000 mg total) by mouth 2 (two) times daily. 12/14/21   Estill Dooms, NP     Family History Family History  Problem Relation Age of Onset   Hypertension Mother    Hyperlipidemia Mother    Diabetes Mother    Hyperlipidemia Father    Hypertension Father    Diabetes Father    Seizures Father    Diabetes Sister    Hypertension Sister    Diabetes Sister    Hypertension Sister    Hypertension Sister    Diabetes Brother    Hypertension Brother    Asthma Daughter    Asthma Son    Cancer Maternal Aunt        breast   Colon cancer Neg Hx    Colon polyps Neg Hx  Social History Social History   Tobacco Use   Smoking status: Never   Smokeless tobacco: Never  Vaping Use   Vaping Use: Never used  Substance Use Topics   Alcohol use: No   Drug use: No     Allergies   Penicillins, Codeine, and Sulfa antibiotics   Review of Systems Review of Systems Per HPI  Physical Exam Triage Vital Signs ED Triage Vitals  Enc Vitals Group     BP 12/29/21 1007 (!) 142/102     Pulse Rate 12/29/21 1007 62     Resp 12/29/21 1007 18     Temp 12/29/21 1007 98 F (36.7 C)     Temp Source 12/29/21 1007 Oral     SpO2 12/29/21 1007 98 %     Weight --      Height --      Head Circumference --      Peak Flow --      Pain Score 12/29/21 1005 9     Pain Loc --      Pain Edu? --      Excl. in [place]? --    No data found.  Updated Vital Signs BP (!) 142/102 (BP Location: Right Arm)   Pulse 62   Temp 98 F (36.7 C) (Oral)   Resp 18   LMP 12/02/2021   SpO2 98%   Visual Acuity Right Eye Distance:   Left Eye Distance:   Bilateral Distance:    Right Eye Near:   Left Eye Near:    Bilateral Near:     Physical Exam Vitals and nursing note reviewed.  Constitutional:      Appearance: Normal appearance. She is not ill-appearing.  HENT:     Head: Atraumatic.     Mouth/Throat:     Mouth: Mucous membranes are moist.  Eyes:     Extraocular Movements: Extraocular movements intact.     Conjunctiva/sclera: Conjunctivae normal.  Cardiovascular:     Rate  and Rhythm: Normal rate and regular rhythm.     Heart sounds: Normal heart sounds.  Pulmonary:     Effort: Pulmonary effort is normal.     Breath sounds: Normal breath sounds.  Musculoskeletal:        General: Tenderness present. No swelling or deformity. Normal range of motion.     Cervical back: Normal range of motion and neck supple.     Comments: No midline spinal tenderness to palpation diffusely.  Negative straight leg raise bilaterally.  Left lateral lumbar region tender to palpation extending into left buttock  Skin:    General: Skin is warm and dry.     Findings: No erythema.  Neurological:     Mental Status: She is alert and oriented to person, place, and time.     Comments: B/l LEs neurovascularly intact  Psychiatric:        Mood and Affect: Mood normal.        Thought Content: Thought content normal.        Judgment: Judgment normal.      UC Treatments / Results  Labs (all labs ordered are listed, but only abnormal results are displayed) Labs Reviewed - No data to display  EKG   Radiology No results found.  Procedures Procedures (including critical care time)  Medications Ordered in UC Medications - No data to display  Initial Impression / Assessment and Plan / UC Course  I have reviewed the triage vital signs and the nursing notes.  Pertinent labs &  imaging results that were available during my care of the patient were reviewed by me and considered in my medical decision making (see chart for details).     Suspect some piriformis syndrome causing left-sided sciatica.  We will restart a prednisone taper as the dose that she is currently taking is likely not enough to affect her current back issue.  She already has muscle relaxers at home, continue as needed use of this and stretches, warm Epsom salt baths.  Work note given.  Return for worsening symptoms.  Has a follow-up scheduled tomorrow with her primary care provider.  Final Clinical Impressions(s) /  UC Diagnoses   Final diagnoses:  Left sided sciatica   Discharge Instructions   None    ED Prescriptions     Medication Sig Dispense Auth. Provider   predniSONE (DELTASONE) 10 MG tablet Take 6 tabs day one, 5 tabs day two, 4 tabs day three, etc 21 tablet Volney American, [place]      PDMP not reviewed this encounter.   Volney American, [place] 12/29/21 1037

## 2021-12-30 ENCOUNTER — Ambulatory Visit: Payer: Medicaid Other | Admitting: Adult Health

## 2021-12-30 ENCOUNTER — Encounter: Payer: Self-pay | Admitting: Adult Health

## 2021-12-30 VITALS — BP 175/86 | HR 63 | Ht 66.0 in | Wt 266.5 lb

## 2021-12-30 DIAGNOSIS — M5442 Lumbago with sciatica, left side: Secondary | ICD-10-CM | POA: Diagnosis not present

## 2021-12-30 DIAGNOSIS — Z713 Dietary counseling and surveillance: Secondary | ICD-10-CM | POA: Diagnosis not present

## 2021-12-30 DIAGNOSIS — M5441 Lumbago with sciatica, right side: Secondary | ICD-10-CM

## 2021-12-30 DIAGNOSIS — Z6841 Body Mass Index (BMI) 40.0 and over, adult: Secondary | ICD-10-CM

## 2021-12-30 DIAGNOSIS — J301 Allergic rhinitis due to pollen: Secondary | ICD-10-CM | POA: Diagnosis not present

## 2021-12-30 DIAGNOSIS — K219 Gastro-esophageal reflux disease without esophagitis: Secondary | ICD-10-CM | POA: Diagnosis not present

## 2021-12-30 DIAGNOSIS — I10 Essential (primary) hypertension: Secondary | ICD-10-CM | POA: Diagnosis not present

## 2021-12-30 DIAGNOSIS — M5430 Sciatica, unspecified side: Secondary | ICD-10-CM | POA: Diagnosis not present

## 2021-12-30 NOTE — Progress Notes (Signed)
  Subjective:     Patient ID: Marie Mills, female   DOB: 24-Jul-1970, 51 y.o.   MRN: [id number]  HPI Marie Mills is a 51 year old white female,divorced, [id number], in for weight and BP check, she stopped phentermine  When got sinus infection and was placed on steroids and antibiotics. Today she is walking slow, has low back pain with pain radiating dow both legs, R>L, was seen at Urgent Care yesterday. She is going to PT for right knee and has appt with Dr Aline Brochure next week.  Last pap 02/03/21 High risk HPV Negative   Adequacy Satisfactory for evaluation; transformation zone component ABSENT.   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   PCP is Dr Sherrie Sport  Review of Systems +gain gain on steroids  +back low back radiates down both legs Reviewed past medical,surgical, social and family history. Reviewed medications and allergies.     Objective:   Physical Exam BP (!) 175/86 (BP Location: Right Arm, Cuff Size: Large)   Pulse 63   Ht '5\' 6"'$  (1.676 m)   Wt 266 lb 8 oz (120.9 kg)   LMP 12/29/2021   BMI 43.01 kg/m    Skin warm and dry.  Lungs: clear to ausculation bilaterally. Cardiovascular: regular rate and rhythm.   Upstream - 12/30/21 0852       Pregnancy Intention Screening   Does the patient want to become pregnant in the next year? No    Does the patient's partner want to become pregnant in the next year? No    Would the patient like to discuss contraceptive options today? No      Contraception Wrap Up   Current Method Oral Contraceptive    End Method Oral Contraceptive             Assessment:     1. Weight loss counseling, encounter for +weight gain on steroids Encouraged to keep trying, stay off adipex for now  2. Body mass index 40.0-44.9, adult ([place])  3. Acute bilateral low back pain with bilateral sciatica Try alternating heat and ice Try Voltaren gel Take 800 mg  Motrin and 2 ES tylenol every 8 hours as needed Call Dr Aline Brochure to see if can be seen sooner     Plan:     Return 02/04/22 for physical

## 2022-01-01 ENCOUNTER — Encounter (HOSPITAL_COMMUNITY): Payer: Self-pay | Admitting: *Deleted

## 2022-01-01 ENCOUNTER — Emergency Department (HOSPITAL_COMMUNITY): Payer: Medicaid Other

## 2022-01-01 ENCOUNTER — Emergency Department (HOSPITAL_COMMUNITY)
Admission: EM | Admit: 2022-01-01 | Discharge: 2022-01-01 | Disposition: A | Discharge status: Home / Self Care | Payer: Medicaid Other | Attending: Emergency Medicine | Admitting: Emergency Medicine

## 2022-01-01 ENCOUNTER — Other Ambulatory Visit: Payer: Self-pay

## 2022-01-01 DIAGNOSIS — G8929 Other chronic pain: Secondary | ICD-10-CM | POA: Diagnosis not present

## 2022-01-01 DIAGNOSIS — Z7982 Long term (current) use of aspirin: Secondary | ICD-10-CM | POA: Diagnosis not present

## 2022-01-01 DIAGNOSIS — M5442 Lumbago with sciatica, left side: Secondary | ICD-10-CM | POA: Insufficient documentation

## 2022-01-01 DIAGNOSIS — M549 Dorsalgia, unspecified: Secondary | ICD-10-CM | POA: Diagnosis present

## 2022-01-01 DIAGNOSIS — G5702 Lesion of sciatic nerve, left lower limb: Secondary | ICD-10-CM | POA: Diagnosis not present

## 2022-01-01 DIAGNOSIS — I10 Essential (primary) hypertension: Secondary | ICD-10-CM | POA: Diagnosis not present

## 2022-01-01 LAB — URINALYSIS, ROUTINE W REFLEX MICROSCOPIC
Bacteria, UA: NONE SEEN
Bilirubin Urine: NEGATIVE
Glucose, UA: NEGATIVE mg/dL
Hgb urine dipstick: NEGATIVE
Ketones, ur: NEGATIVE mg/dL
Leukocytes,Ua: NEGATIVE
Nitrite: NEGATIVE
Protein, ur: 30 mg/dL — AB
Specific Gravity, Urine: 1.029 (ref 1.005–1.030)
pH: 6 (ref 5.0–8.0)

## 2022-01-01 MED ORDER — ACETAMINOPHEN 325 MG PO TABS: 650.0000 mg | ORAL_TABLET | Freq: Four times a day (QID) | ORAL | 0 refills | Status: DC | PRN

## 2022-01-01 MED ORDER — SODIUM CHLORIDE 0.9 % IV BOLUS
1000.0000 mL | Freq: Once | INTRAVENOUS | Status: AC
  Administered 2022-01-01: 1000 mL via INTRAVENOUS

## 2022-01-01 MED ORDER — KETOROLAC TROMETHAMINE 60 MG/2ML IM SOLN
60.0000 mg | Freq: Once | INTRAMUSCULAR | Status: AC
  Administered 2022-01-01: 60 mg via INTRAMUSCULAR
  Filled 2022-01-01: qty 2

## 2022-01-01 MED ORDER — IBUPROFEN 600 MG PO TABS: 600.0000 mg | ORAL_TABLET | Freq: Four times a day (QID) | ORAL | 0 refills | Status: DC | PRN

## 2022-01-01 MED ORDER — MAGNESIUM SULFATE 2 GM/50ML IV SOLN
2.0000 g | Freq: Once | INTRAVENOUS | Status: AC
  Administered 2022-01-01: 2 g via INTRAVENOUS
  Filled 2022-01-01: qty 50

## 2022-01-01 MED ORDER — HYDROMORPHONE HCL 4 MG PO TABS: 4.0000 mg | ORAL_TABLET | Freq: Four times a day (QID) | ORAL | 0 refills | Status: DC | PRN

## 2022-01-01 MED ORDER — HYDROMORPHONE HCL 1 MG/ML IJ SOLN
1.0000 mg | Freq: Once | INTRAMUSCULAR | Status: AC
  Administered 2022-01-01: 1 mg via INTRAVENOUS
  Filled 2022-01-01: qty 1

## 2022-01-01 MED ORDER — HYDROCODONE-ACETAMINOPHEN 5-325 MG PO TABS
1.0000 | ORAL_TABLET | Freq: Once | ORAL | Status: AC
  Administered 2022-01-01: 1 via ORAL
  Filled 2022-01-01: qty 1

## 2022-01-01 MED ORDER — ONDANSETRON HCL 4 MG/2ML IJ SOLN
4.0000 mg | Freq: Once | INTRAMUSCULAR | Status: AC
  Administered 2022-01-01: 4 mg via INTRAVENOUS
  Filled 2022-01-01: qty 2

## 2022-01-01 MED ORDER — LIDOCAINE 5 % EX PTCH: 1.0000 | MEDICATED_PATCH | Freq: Every day | CUTANEOUS | 0 refills | Status: DC | PRN

## 2022-01-01 MED ORDER — LIDOCAINE 5 % EX PTCH
1.0000 | MEDICATED_PATCH | CUTANEOUS | Status: DC
  Administered 2022-01-01: 1 via TRANSDERMAL
  Filled 2022-01-01: qty 1

## 2022-01-01 MED ORDER — OXYCODONE HCL 5 MG PO TABS: 5.0000 mg | ORAL_TABLET | ORAL | 0 refills | Status: DC | PRN

## 2022-01-01 MED ORDER — HYDROMORPHONE HCL 1 MG/ML IJ SOLN
1.0000 mg | Freq: Once | INTRAMUSCULAR | Status: AC
  Administered 2022-01-01: 1 mg via INTRAMUSCULAR
  Filled 2022-01-01: qty 1

## 2022-01-01 MED ORDER — METHOCARBAMOL 500 MG PO TABS: 1000.0000 mg | ORAL_TABLET | Freq: Two times a day (BID) | ORAL | 0 refills | Status: AC

## 2022-01-01 NOTE — Discharge Instructions (Addendum)
It was a pleasure caring for you today in the emergency department.  Please return to the emergency department for any worsening or worrisome symptoms.    Please follow-up with your Primary Care Physician within the next week. Please take your medications as instructed and discuss any changes to your medications with your primary care physician.    Please return to the Emergency Department if you have any leg numbness, leg weakness, difficulty walking, fevers, worsening of pain, lightheadedness, lose consciousness, severe abdominal pain, severe headache, difficulty urinating, or difficulty having a bowel movement.   Please return to the emergency department immediately for any new or concerning symptoms, or if you get worse.    Follow-up with your orthopedic doctor or your family doctor this week.  If your back gets worse and you need to go to the emergency department, you need to get seen either in [place] or [facility]

## 2022-01-01 NOTE — ED Provider Notes (Signed)
[facility] EMERGENCY DEPARTMENT Provider Note   CSN: [id number] Arrival date & time: 01/01/22  1008     History  Chief Complaint  Patient presents with   Back Pain    Marie Mills is a 51 y.o. female.  Patient as above with significant medical history as below, including  GERD, HTN, sciatica who presents to the ED with complaint of left sided back pain over the past few weeks, worse in the last week. Working with pcp, received an injection yesterday, she is unsure what it was, was on oral steroids, motrin/tylenol, gabapentin, and was recently rx'd tramadol but has not picked up. Also is scheduled to trial PT but unable to complete PT 2/2 pain, diff w/ ambulation. No numbness, no recent falls, no fevers, no IV drug use, no saddle paresthesias, no urinary overflow incontinence, no bowel overflow incontinence.  Pain is primarily to her left low back and left gluteal area.  Radiation down her leg posteriorly.  intermittent tingling down her leg.  No numbness.     Past Medical History:  Diagnosis Date   BV (bacterial vaginosis) 08/29/2013   Contraceptive management 03/21/2013   GERD (gastroesophageal reflux disease)    Hypertension    Obesity    Vaginal irritation 08/29/2013    Past Surgical History:  Procedure Laterality Date   CHOLECYSTECTOMY     COLONOSCOPY WITH PROPOFOL N/A 07/30/2021   Procedure: COLONOSCOPY WITH PROPOFOL;  Surgeon: Eloise Harman, DO;  Location: [facility] SUITE;  Service: Endoscopy;  Laterality: N/A;  2:45pm   POLYPECTOMY  07/30/2021   Procedure: POLYPECTOMY;  Surgeon: Eloise Harman, DO;  Location: [facility];  Service: Endoscopy;;     The history is provided by the patient. No language interpreter was used.  Back Pain Associated symptoms: no abdominal pain, no chest pain, no dysuria, no fever and no headaches        Home Medications Prior to Admission medications   Medication Sig Start Date End Date Taking? Authorizing Provider  acetaminophen  (TYLENOL) 325 MG tablet Take 2 tablets (650 mg total) by mouth every 6 (six) hours as needed. 01/01/22  Yes Wynona Dove A, DO  amLODipine (NORVASC) 2.5 MG tablet TAKE 1 TABLET(2.5 MG) BY MOUTH DAILY Patient taking differently: Take 2.5 mg by mouth daily. 10/18/21  Yes Fay Records, MD  aspirin EC 81 MG tablet Take 81 mg by mouth daily. Swallow whole.   Yes [provider]  azelastine (ASTELIN) 0.1 % nasal spray Place 2 sprays into both nostrils 2 (two) times daily. Use in each nostril as directed 08/09/21  Yes Leath-Warren, Alda Lea, NP  budesonide (PULMICORT) 1 MG/2ML nebulizer solution Take 1 mg by nebulization daily. 09/08/21  Yes [provider]  cetirizine (ZYRTEC) 10 MG tablet Take 1 tablet (10 mg total) by mouth daily. 08/09/21  Yes Leath-Warren, Alda Lea, NP  doxycycline (VIBRA-TABS) 100 MG tablet Take 1 tablet (100 mg total) by mouth 2 (two) times daily. 12/14/21  Yes Derrek Monaco A, NP  fluticasone (FLONASE) 50 MCG/ACT nasal spray Place 1 spray into both nostrils daily.   Yes [provider]  gabapentin (NEURONTIN) 300 MG capsule Take 300 mg by mouth daily. 12/30/21  Yes [provider]  ibuprofen (ADVIL) 600 MG tablet Take 1 tablet (600 mg total) by mouth every 6 (six) hours as needed. 01/01/22  Yes Wynona Dove A, DO  ipratropium (ATROVENT) 0.06 % nasal spray Place 2 sprays into both nostrils 4 (four) times daily.  Yes [provider]  lidocaine (LIDODERM) 5 % Place 1 patch onto the skin daily as needed. Remove & Discard patch within 12 hours or as directed by MD 01/01/22  Yes Wynona Dove A, DO  losartan (COZAAR) 50 MG tablet Take 50 mg by mouth daily. 09/08/21  Yes [provider]  methocarbamol (ROBAXIN) 500 MG tablet Take 2 tablets (1,000 mg total) by mouth 2 (two) times daily for 5 days. 01/01/22 01/06/22 Yes Wynona Dove A, DO  metoprolol succinate (TOPROL-XL) 25 MG 24 hr tablet Take 25 mg by mouth daily. 04/23/19  Yes [provider]  montelukast (SINGULAIR) 10 MG tablet Take 10 mg by mouth daily. 05/03/21  Yes [provider]  Norgestimate-Ethinyl Estradiol Triphasic (TRI-SPRINTEC) 0.18/0.215/0.25 MG-35 MCG tablet Take 1 tablet by mouth daily. 02/03/21  Yes Derrek Monaco A, NP  nystatin cream (MYCOSTATIN) Apply 1 Application topically 2 (two) times daily. Patient taking differently: Apply 1 Application topically 2 (two) times daily as needed for dry skin. 12/06/21  Yes Estill Dooms, NP  omeprazole (PRILOSEC) 40 MG capsule TK 1 C PO QD Patient taking differently: Take 40 mg by mouth daily. 03/04/19  Yes Derrek Monaco A, NP  oxyCODONE (ROXICODONE) 5 MG immediate release tablet Take 1 tablet (5 mg total) by mouth every 4 (four) hours as needed for severe pain. 01/01/22  Yes Wynona Dove A, DO  predniSONE (DELTASONE) 10 MG tablet Take 6 tabs day one, 5 tabs day two, 4 tabs day three, etc Patient taking differently: Take 10-60 mg by mouth See admin instructions. Take 6 tabs day one, 5 tabs day two, 4 tabs day three, 3 tabs day four, 2 tabs day five, 1 tab day six. 12/29/21  Yes Volney American, PA-C  sulindac (CLINORIL) 150 MG tablet Take 1 tablet (150 mg total) by mouth 2 (two) times daily. 11/17/21  Yes Carole Civil, MD  valACYclovir (VALTREX) 1000 MG tablet Take 1 tablet (1,000 mg total) by mouth 2 (two) times daily. 12/14/21  Yes Estill Dooms, NP  phentermine (ADIPEX-P) 37.5 MG tablet Take 1 tablet (37.5 mg total) by mouth daily before breakfast. Take 1 daily Patient not taking: Reported on 12/30/2021 12/02/21   Estill Dooms, NP      Allergies    Penicillins, Codeine, and Sulfa antibiotics    Review of Systems   Review of Systems  Constitutional:  Negative for activity change and fever.  HENT:  Negative for facial swelling and trouble swallowing.   Eyes:  Negative for discharge and redness.  Respiratory:  Negative for cough and shortness of breath.    Cardiovascular:  Negative for chest pain and palpitations.  Gastrointestinal:  Negative for abdominal pain and nausea.  Genitourinary:  Negative for dysuria and flank pain.  Musculoskeletal:  Positive for back pain and myalgias. Negative for gait problem.  Skin:  Negative for pallor and rash.  Neurological:  Negative for syncope and headaches.    Physical Exam Updated Vital Signs BP (!) 166/91 (BP Location: Left Arm)   Pulse 77   Temp 98 F (36.7 C) (Oral)   Resp 18   Ht '5\' 6"'$  (1.676 m)   Wt 120.9 kg   LMP 12/29/2021   SpO2 100%   BMI 43.01 kg/m  Physical Exam Vitals and nursing note reviewed.  Constitutional:      General: She is not in acute distress.    Appearance: Normal appearance.  HENT:     Head: Normocephalic and atraumatic.  Right Ear: External ear normal.     Left Ear: External ear normal.     Nose: Nose normal.     Mouth/Throat:     Mouth: Mucous membranes are moist.  Eyes:     General: No scleral icterus.       Right eye: No discharge.        Left eye: No discharge.  Cardiovascular:     Rate and Rhythm: Normal rate and regular rhythm.     Pulses: Normal pulses.     Heart sounds: Normal heart sounds.  Pulmonary:     Effort: Pulmonary effort is normal. No respiratory distress.     Breath sounds: Normal breath sounds.  Abdominal:     General: Abdomen is flat.     Tenderness: There is no abdominal tenderness.  Musculoskeletal:        General: Normal range of motion.     Cervical back: Normal range of motion.     Right lower leg: No edema.     Left lower leg: No edema.     Comments: No midline spinous process tenderness to palpation or percussion, no crepitus or step-off.   No pain with palpation to b/l hip  2+ DP equal b/l, cap refill is brisk to feet, no LE edema   Skin:    General: Skin is warm and dry.     Capillary Refill: Capillary refill takes less than 2 seconds.  Neurological:     Mental Status: She is alert.  Psychiatric:         Mood and Affect: Mood normal.        Behavior: Behavior normal.     ED Results / Procedures / Treatments   Labs (all labs ordered are listed, but only abnormal results are displayed) Labs Reviewed  URINALYSIS, ROUTINE W REFLEX MICROSCOPIC - Abnormal; Notable for the following components:      Result Value   Protein, ur 30 (*)    All other components within normal limits  POC URINE PREG, ED    EKG None  Radiology DG Lumbar Spine Complete  Result Date: 01/01/2022 CLINICAL DATA:  Lower back pain EXAM: LUMBAR SPINE - COMPLETE 4+ VIEW COMPARISON:  11/15/2019 FINDINGS: Normal alignment. Vertebral body heights are well preserved. No signs of acute fracture. There is mild disc space narrowing and endplate spurring noted at L2-3, L3-4, L4-5. IMPRESSION: Mild multilevel degenerative disc disease. Electronically Signed   By: Kerby Moors M.D.   On: 01/01/2022 11:20    Procedures Procedures    Medications Ordered in ED Medications  lidocaine (LIDODERM) 5 % 1 patch (1 patch Transdermal Patch Applied 01/01/22 1236)  HYDROmorphone (DILAUDID) injection 1 mg (has no administration in time range)  ondansetron (ZOFRAN) injection 4 mg (has no administration in time range)  sodium chloride 0.9 % bolus 1,000 mL (has no administration in time range)  magnesium sulfate IVPB 2 g 50 mL (has no administration in time range)  HYDROcodone-acetaminophen (NORCO/VICODIN) 5-325 MG per tablet 1 tablet (1 tablet Oral Given 01/01/22 1237)  ketorolac (TORADOL) injection 60 mg (60 mg Intramuscular Given 01/01/22 1237)  HYDROmorphone (DILAUDID) injection 1 mg (1 mg Intramuscular Given 01/01/22 1513)    ED Course/ Medical Decision Making/ A&P Clinical Course as of 01/01/22 1638  Sat Jan 01, 2022  1634 Back pain persists despite analgesics, diff w/ ambulation, will pursue ct imaging and give further iv analgesics and IVF. Her neuro exam is non-focal, neurovasc intact le b/l [SG]  Clinical Course User Index [SG]  Jeanell Sparrow, DO                           Medical Decision Making Amount and/or Complexity of Data Reviewed Labs: ordered. Radiology: ordered.  Risk OTC drugs. Prescription drug management.   This patient presents to the ED with chief complaint(s) of back pain with pertinent past medical history of chronic back pain which further complicates the presenting complaint. The complaint involves an extensive differential diagnosis and also carries with it a high risk of complications and morbidity.    Differential diagnosis includes but is not exclusive to musculoskeletal back pain, renal colic, urinary tract infection, pyelonephritis, intra-abdominal causes of back pain, aortic aneurysm or dissection, cauda equina syndrome, sciatica, lumbar disc disease, thoracic disc disease, etc. I have very low suspicion for spinal cord compression, cauda equina syndrome but this is on differential  . Serious etiologies were considered.   The initial plan is to xr, meds   Additional history obtained: Additional history obtained from  na Records reviewed Primary Care Documents home meds  Independent labs interpretation:  The following labs were independently interpreted: ua neg, preg pending   Independent visualization of imaging: - I independently visualized the following imaging with scope of interpretation limited to determining acute life threatening conditions related to emergency care: lumbar xr, which revealed no acute process, shows degenerative dz  Cardiac monitoring was reviewed and interpreted by myself which shows na  Treatment and Reassessment: Norco Dilaudid Toradol Lidoderm >> pain not improved   Consultation: - Consulted or discussed management/test interpretation w/ external professional: n/a  Consideration for admission or further workup: Admission was considered  Pt with persistent back pain despite analgesics, her neuro exam is nonfocal. Diff w/ ambulation 2/2 pain.  She has no midline pain, no fevers, no IVDU, no trauma. Will obtain CT to better differentiate her discomfort, give further meds. Signed out to incomign EDP Dr Roderic Palau pending imaging and re-assessment following meds. Pt HDS.    Social Determinants of health: Social History   Tobacco Use   Smoking status: Never   Smokeless tobacco: Never  Vaping Use   Vaping Use: Never used  Substance Use Topics   Alcohol use: No   Drug use: No              Final Clinical Impression(s) / ED Diagnoses Final diagnoses:  Chronic left-sided low back pain with left-sided sciatica  Piriformis syndrome, left    Rx / DC Orders ED Discharge Orders          Ordered    methocarbamol (ROBAXIN) 500 MG tablet  2 times daily        01/01/22 1527    oxyCODONE (ROXICODONE) 5 MG immediate release tablet  Every 4 hours PRN        01/01/22 1527    acetaminophen (TYLENOL) 325 MG tablet  Every 6 hours PRN        01/01/22 1527    ibuprofen (ADVIL) 600 MG tablet  Every 6 hours PRN        01/01/22 1527    lidocaine (LIDODERM) 5 %  Daily PRN        01/01/22 1527              Jeanell Sparrow, DO 01/01/22 1638

## 2022-01-01 NOTE — ED Triage Notes (Signed)
Pt c/o lower back pain; pt states she has seen her PCP with no relief; pt now states she has shooting pains going down both legs and numbness to both her feet

## 2022-01-01 NOTE — ED Notes (Signed)
15m urine post void residual noted with bladder scanner

## 2022-01-03 ENCOUNTER — Other Ambulatory Visit: Payer: Self-pay

## 2022-01-03 ENCOUNTER — Emergency Department (HOSPITAL_COMMUNITY)
Admission: EM | Admit: 2022-01-03 | Discharge: 2022-01-03 | Disposition: A | Discharge status: Home / Self Care | Payer: Medicaid Other | Attending: Emergency Medicine | Admitting: Emergency Medicine

## 2022-01-03 ENCOUNTER — Encounter (HOSPITAL_COMMUNITY): Payer: Self-pay

## 2022-01-03 DIAGNOSIS — I10 Essential (primary) hypertension: Secondary | ICD-10-CM | POA: Insufficient documentation

## 2022-01-03 DIAGNOSIS — M545 Low back pain, unspecified: Secondary | ICD-10-CM | POA: Diagnosis present

## 2022-01-03 DIAGNOSIS — M5442 Lumbago with sciatica, left side: Secondary | ICD-10-CM | POA: Insufficient documentation

## 2022-01-03 DIAGNOSIS — Z79899 Other long term (current) drug therapy: Secondary | ICD-10-CM | POA: Diagnosis not present

## 2022-01-03 DIAGNOSIS — Z7982 Long term (current) use of aspirin: Secondary | ICD-10-CM | POA: Insufficient documentation

## 2022-01-03 MED ORDER — PREDNISONE 10 MG (21) PO TBPK: ORAL_TABLET | Freq: Every day | ORAL | 0 refills | Status: DC

## 2022-01-03 NOTE — ED Notes (Signed)
Pt was informed of discharge instructions. Pt verbalized an understanding of instructions reviewed. Will follow up with ortho. Pt discharged with support person in a wheelchair. VS stable. Pt in NAD at time of departure.

## 2022-01-03 NOTE — Discharge Instructions (Addendum)
You were seen today for low back pain which is consistent with sciatica.  Please follow-up with the orthopedic provider this Thursday for further evaluation and management as needed.

## 2022-01-03 NOTE — ED Provider Triage Note (Signed)
Emergency Medicine Provider Triage Evaluation Note  Princes Finger , a 51 y.o. female  was evaluated in triage.  Pt complains of lower back pain with radiation to her left leg.  She reports associated numbness and difficulty walking.  She has been seen in the emergency department for the same.  She has been on pain medication, anti-inflammatories, muscle relaxer --however these have not been helping.  Patient denies fecal incontinence, urinary retention or overflow incontinence.  Review of Systems  Positive: Back pain, numbness Negative: Fever  Physical Exam  BP (!) 156/86 (BP Location: Left Arm)   Pulse 91   Temp 97.7 F (36.5 C) (Oral)   Resp 18   Ht '5\' 6"'$  (1.676 m)   Wt 120.7 kg   LMP 12/29/2021   SpO2 96%   BMI 42.93 kg/m  Gen:   Awake, no distress   Resp:  Normal effort  MSK:   Moves extremities without difficulty  Other:  Lower back tenderness to palpation  Medical Decision Making  Medically screening exam initiated at 11:18 AM.  Appropriate orders placed.  Sanaya Gwilliam was informed that the remainder of the evaluation will be completed by another provider, this initial triage assessment does not replace that evaluation, and the importance of remaining in the ED until their evaluation is complete.     Carlisle Cater, PA-C 01/03/22 1119

## 2022-01-03 NOTE — ED Triage Notes (Signed)
Complains of back pain and left leg numbness and swelling. Has been seen several times for same and reports meds she has received has not helped. Denies bowel or bladder loss

## 2022-01-03 NOTE — ED Provider Notes (Signed)
[facility] EMERGENCY DEPARTMENT Provider Note   CSN: [id number] Arrival date & time: 01/03/22  1101     History  Chief Complaint  Patient presents with   Back Pain    Marie Mills is a 51 y.o. female.  Patient presents to the emergency department complaining of left-sided low back pain, left-sided radicular symptoms, and intermittent right-sided radicular symptoms.  Patient states she has had this pain over the past few weeks.  It has been worse over the past week.  She endorses the left-sided back pain, left buttock pain, radiation down her left leg in a posterior distribution, intermittent tingling down the same leg.  She denies numbness.  Her primary care provider has provided her with steroid injections in the buttock region which have not provided relief.  She has also taken oxycodone, ibuprofen, gabapentin, methocarbamol for the pain.  She has been evaluated by both urgent care and the emergency department for this pain.  Patient is followed by orthopedics for knee pain and is scheduled to start physical therapy for same but states she is unable to do so due to pain.  She is here today due to continued pain.  Patient denies saddle anesthesias, recent injury, fever, IV drug use, urinary incontinence, urinary retention.  Past medical history significant for acute bilateral low back pain with bilateral sciatica, obesity, hypertension, GERD  HPI     Home Medications Prior to Admission medications   Medication Sig Start Date End Date Taking? Authorizing Provider  acetaminophen (TYLENOL) 325 MG tablet Take 2 tablets (650 mg total) by mouth every 6 (six) hours as needed. 01/01/22   Jeanell Sparrow, DO  amLODipine (NORVASC) 2.5 MG tablet TAKE 1 TABLET(2.5 MG) BY MOUTH DAILY Patient taking differently: Take 2.5 mg by mouth daily. 10/18/21   Fay Records, MD  aspirin EC 81 MG tablet Take 81 mg by mouth daily. Swallow whole.    [provider]  azelastine (ASTELIN)  0.1 % nasal spray Place 2 sprays into both nostrils 2 (two) times daily. Use in each nostril as directed 08/09/21   Leath-Warren, Alda Lea, NP  budesonide (PULMICORT) 1 MG/2ML nebulizer solution Take 1 mg by nebulization daily. 09/08/21   [provider]  cetirizine (ZYRTEC) 10 MG tablet Take 1 tablet (10 mg total) by mouth daily. 08/09/21   Leath-Warren, Alda Lea, NP  doxycycline (VIBRA-TABS) 100 MG tablet Take 1 tablet (100 mg total) by mouth 2 (two) times daily. 12/14/21   Estill Dooms, NP  fluticasone (FLONASE) 50 MCG/ACT nasal spray Place 1 spray into both nostrils daily.    [provider]  gabapentin (NEURONTIN) 300 MG capsule Take 300 mg by mouth daily. 12/30/21   [provider]  HYDROmorphone (DILAUDID) 4 MG tablet Take 1 tablet (4 mg total) by mouth every 6 (six) hours as needed for severe pain. 01/01/22   Milton Ferguson, MD  ibuprofen (ADVIL) 600 MG tablet Take 1 tablet (600 mg total) by mouth every 6 (six) hours as needed. 01/01/22   Wynona Dove A, DO  ipratropium (ATROVENT) 0.06 % nasal spray Place 2 sprays into both nostrils 4 (four) times daily.    [provider]  lidocaine (LIDODERM) 5 % Place 1 patch onto the skin daily as needed. Remove & Discard patch within 12 hours or as directed by MD 01/01/22   Wynona Dove A, DO  losartan (COZAAR) 50 MG tablet Take 50 mg by mouth daily. 09/08/21   [provider]  methocarbamol (ROBAXIN) 500 MG tablet Take 2 tablets (1,000 mg total) by mouth 2 (two) times daily for 5 days. 01/01/22 01/06/22  Wynona Dove A, DO  metoprolol succinate (TOPROL-XL) 25 MG 24 hr tablet Take 25 mg by mouth daily. 04/23/19   [provider]  montelukast (SINGULAIR) 10 MG tablet Take 10 mg by mouth daily. 05/03/21   [provider]  Norgestimate-Ethinyl Estradiol Triphasic (TRI-SPRINTEC) 0.18/0.215/0.25 MG-35 MCG tablet Take 1 tablet by mouth daily. 02/03/21   Estill Dooms, NP  nystatin cream  (MYCOSTATIN) Apply 1 Application topically 2 (two) times daily. Patient taking differently: Apply 1 Application topically 2 (two) times daily as needed for dry skin. 12/06/21   Estill Dooms, NP  omeprazole (PRILOSEC) 40 MG capsule TK 1 C PO QD Patient taking differently: Take 40 mg by mouth daily. 03/04/19   Estill Dooms, NP  oxyCODONE (ROXICODONE) 5 MG immediate release tablet Take 1 tablet (5 mg total) by mouth every 4 (four) hours as needed for severe pain. 01/01/22   Jeanell Sparrow, DO  phentermine (ADIPEX-P) 37.5 MG tablet Take 1 tablet (37.5 mg total) by mouth daily before breakfast. Take 1 daily Patient not taking: Reported on 12/30/2021 12/02/21   Estill Dooms, NP  predniSONE (DELTASONE) 10 MG tablet Take 6 tabs day one, 5 tabs day two, 4 tabs day three, etc Patient taking differently: Take 10-60 mg by mouth See admin instructions. Take 6 tabs day one, 5 tabs day two, 4 tabs day three, 3 tabs day four, 2 tabs day five, 1 tab day six. 12/29/21   Volney American, PA-C  sulindac (CLINORIL) 150 MG tablet Take 1 tablet (150 mg total) by mouth 2 (two) times daily. 11/17/21   Carole Civil, MD  valACYclovir (VALTREX) 1000 MG tablet Take 1 tablet (1,000 mg total) by mouth 2 (two) times daily. 12/14/21   Estill Dooms, NP      Allergies    Penicillins, Codeine, and Sulfa antibiotics    Review of Systems   Review of Systems  Musculoskeletal:  Positive for back pain.    Physical Exam Updated Vital Signs BP (!) 156/86 (BP Location: Left Arm)   Pulse 91   Temp 97.7 F (36.5 C) (Oral)   Resp 18   Ht '5\' 6"'$  (1.676 m)   Wt 120.7 kg   LMP 12/29/2021   SpO2 96%   BMI 42.93 kg/m  Physical Exam Vitals and nursing note reviewed.  Constitutional:      General: She is not in acute distress.    Appearance: She is well-developed.  HENT:     Head: Normocephalic and atraumatic.  Eyes:     Conjunctiva/sclera: Conjunctivae normal.  Cardiovascular:     Rate and  Rhythm: Normal rate.  Pulmonary:     Effort: Pulmonary effort is normal.  Abdominal:     General: Abdomen is flat.  Musculoskeletal:        General: Tenderness (Mild left-sided low back tenderness, left buttock tenderness.  No midline spinal tenderness noted.) present. No swelling.     Cervical back: Neck supple.     Comments: 2+ DP equal b/l, cap refill is brisk to feet, no LE edema  Skin:    General: Skin is warm and dry.     Capillary Refill: Capillary refill takes less than 2 seconds.  Neurological:     General: No focal deficit present.     Mental Status: She is alert.  Psychiatric:  Mood and Affect: Mood normal.     ED Results / Procedures / Treatments   Labs (all labs ordered are listed, but only abnormal results are displayed) Labs Reviewed - No data to display  EKG None  Radiology CT Lumbar Spine Wo Contrast  Result Date: 01/01/2022 CLINICAL DATA:  Low back pain, cancer suspected EXAM: CT LUMBAR SPINE WITHOUT CONTRAST TECHNIQUE: Multidetector CT imaging of the lumbar spine was performed without intravenous contrast administration. Multiplanar CT image reconstructions were also generated. RADIATION DOSE REDUCTION: This exam was performed according to the departmental dose-optimization program which includes automated exposure control, adjustment of the mA and/or kV according to patient size and/or use of iterative reconstruction technique. COMPARISON:  Radiograph performed earlier on the same date. FINDINGS: Segmentation: 5 lumbar type vertebrae. Alignment: Normal. Vertebrae: No acute fracture or focal pathologic process. Paraspinal and other soft tissues: Negative. Disc levels: T12-L1: No significant finding. L1-L2: No significant findings. L2-L3: No significant findings. l L3-L4: Asymmetric disc bulge to the left with mild left lateral recess stenosis. No significant neural foraminal stenosis. Mild facet joint arthropathy. L4-L5: Disc height loss and disc bulge with  narrowing of lateral recesses bilaterally. Mild bilateral neural foraminal stenosis. Mild-to-moderate bilateral facet joint arthropathy. L5-S1: Broad-based disc bulge with narrowing of lateral recesses bilaterally. Mild bilateral neural foraminal stenosis. Moderate bilateral facet joint arthropathy. IMPRESSION: 1. No acute fracture or traumatic subluxation. 2. Suspicious osseous lesion. 3. Degenerate disc disease prominent at L4-L5 and L5-S1 with narrowing of lateral recesses bilaterally. Mild bilateral neural foraminal stenosis and facet joint arthropathy. Electronically Signed   By: Keane Police D.O.   On: 01/01/2022 17:30    Procedures Procedures    Medications Ordered in ED Medications - No data to display  ED Course/ Medical Decision Making/ A&P                           Medical Decision Making  Patient presents to the hospital complaining of low back pain with radicular symptoms.  Differential includes but is not due to lumbar radiculopathy, herniated disc, fracture, dislocation, and others  I reviewed the patient's past medical history including recent visits to the emergency department at urgent care.  CT scan of the lumbar spine was performed on September 16. 1. No acute fracture or traumatic subluxation. 2. Suspicious osseous lesion. 3. Degenerate disc disease prominent at L4-L5 and L5-S1 with narrowing of lateral recesses bilaterally. Mild bilateral neural foraminal stenosis and facet joint arthropathy.  There is no indication for new imaging at this time.  The patient has no red flag symptoms such as saddle anesthesia, urinary incontinence, urinary retention.  She has no new injury or trauma to the area.  There is no indication at this time for lab work.  The patient is not septic.  The patient has been taking multiple medications that I would normally prescribe for this condition.  She has had steroid injections within the past week.  She has taken muscle relaxants and  anti-inflammatory medication.  I feel that I may add a steroid taper but see no other medication to add at this time.  The patient states she has a follow-up orthopedic appointment on Thursday at which she plans to discuss her back pain.  I believe this is the best course for the patient.  Recommend she follow-up with that provider for further evaluation and management.  I initially was going to send the steroid taper but the patient just finished  one two days ago and saw no relief. Patient will not fill the steroid taper prescription.         Final Clinical Impression(s) / ED Diagnoses Final diagnoses:  Left-sided low back pain with left-sided sciatica, unspecified chronicity    Rx / DC Orders ED Discharge Orders          Ordered    predniSONE (STERAPRED UNI-PAK 21 TAB) 10 MG (21) TBPK tablet  Daily,   Status:  Discontinued        01/03/22 1200              Ronny Bacon 01/03/22 1204    Davonna Belling, MD 01/08/22 [phone]

## 2022-01-04 ENCOUNTER — Telehealth: Payer: Self-pay | Admitting: Orthopedic Surgery

## 2022-01-04 NOTE — Telephone Encounter (Signed)
Call received from patient today; aware of appointment 01/13/22. Relays updates as noted in Epic chart regarding emergency room visit at [facility], with Xray and CT; also emergency room visit at [facility]. States would like to be seen sooner, as said she is having difficulty moving around. Patient is aware on wait list for this new problem appointment. Also aware of 01/06/22 appointment for follow up of knee.  Please review and advise.

## 2022-01-05 ENCOUNTER — Ambulatory Visit (HOSPITAL_COMMUNITY): Payer: Medicaid Other | Admitting: Physical Therapy

## 2022-01-05 ENCOUNTER — Telehealth (HOSPITAL_COMMUNITY): Payer: Self-pay | Admitting: Physical Therapy

## 2022-01-05 NOTE — Telephone Encounter (Signed)
She will see Dr. Aline Brochure before she returns to PT

## 2022-01-06 ENCOUNTER — Telehealth: Payer: Self-pay | Admitting: Orthopedic Surgery

## 2022-01-06 ENCOUNTER — Encounter: Payer: Self-pay | Admitting: Orthopedic Surgery

## 2022-01-06 ENCOUNTER — Ambulatory Visit: Payer: Medicaid Other | Admitting: Orthopedic Surgery

## 2022-01-06 VITALS — BP 191/91 | HR 98 | Ht 66.0 in | Wt 266.0 lb

## 2022-01-06 DIAGNOSIS — M48061 Spinal stenosis, lumbar region without neurogenic claudication: Secondary | ICD-10-CM

## 2022-01-06 DIAGNOSIS — M5410 Radiculopathy, site unspecified: Secondary | ICD-10-CM | POA: Diagnosis not present

## 2022-01-06 DIAGNOSIS — M545 Low back pain, unspecified: Secondary | ICD-10-CM | POA: Diagnosis not present

## 2022-01-06 MED ORDER — GABAPENTIN 300 MG PO CAPS: 300.0000 mg | ORAL_CAPSULE | Freq: Three times a day (TID) | ORAL | 5 refills | Status: DC

## 2022-01-06 MED ORDER — TIZANIDINE HCL 4 MG PO TABS: 4.0000 mg | ORAL_TABLET | Freq: Three times a day (TID) | ORAL | 1 refills | Status: DC

## 2022-01-06 MED ORDER — PREDNISONE 10 MG PO TABS: ORAL_TABLET | ORAL | 1 refills | Status: DC

## 2022-01-06 NOTE — Telephone Encounter (Signed)
Call received from pharmacist at [facility] on [address] clarification on prednisone prescription. [place] [phone]-

## 2022-01-06 NOTE — Telephone Encounter (Signed)
On 01/05/22, spoke w/patient and scheduled appointment with Dr Aline Brochure; aware.

## 2022-01-06 NOTE — Patient Instructions (Addendum)
OOW X 4 WEEKS   TAKE THE FOLLOWING FOR PAIN   TYLENOL for pain 500 MG EVERY 6 HRS AND   Meds ordered this encounter  Medications   tiZANidine (ZANAFLEX) 4 MG tablet  muscle relaxer     Sig: Take 1 tablet (4 mg total) by mouth 3 (three) times daily for 20 days.    Dispense:  30 tablet    Refill:  1   predniSONE (DELTASONE) 10 MG tablet pain and inflammation     Sig: 3 tabs , 3 x a day    Dispense:  90 tablet    Refill:  1   gabapentin (NEURONTIN) 300 MG capsule pain     Sig: Take 1 capsule (300 mg total) by mouth 3 (three) times daily.    Dispense:  90 capsule    Refill:  5   Address: [address], [place], [place] [address] Phone: [phone] Dr Izora Ribas is the name of the Neurosurgeon with [place] [facility], and his office will call you with appointment

## 2022-01-06 NOTE — Progress Notes (Signed)
Chief Complaint  Patient presents with   Back Pain    Not better with prednisone    51 year old female we are seeing with atypical nonspecific knee pain presented to the ER 3 times over the last few weeks with back pain and leg pain  Marie Mills is a 51 y.o. female.  Patient presents to the emergency department complaining of left-sided low back pain, left-sided radicular symptoms, and intermittent right-sided radicular symptoms.  Patient states she has had this pain over the past few weeks.  It has been worse over the past week.  She endorses the left-sided back pain, left buttock pain, radiation down her left leg in a posterior distribution, intermittent tingling down the same leg.  She denies numbness.  Her primary care provider has provided her with steroid injections in the buttock region which have not provided relief.  She has also taken oxycodone, ibuprofen, gabapentin, methocarbamol for the pain.  She has been evaluated by both urgent care and the emergency department for this pain.  Patient is followed by orthopedics for knee pain and is scheduled to start physical therapy for same but states she is unable to do so due to pain.  She is here today due to continued pain.  Patient denies saddle anesthesias, recent injury, fever, IV drug use, urinary incontinence, urinary retention.  Past medical history significant for acute bilateral low back pain with bilateral sciatica, obesity, hypertension, GERD  She went to the ER on the 13th, 16th and 18th of this month finally had a CT scan  This is the report on the CAT scan  I looked at the CT scan and this is my report: And I agree that she has disease at all 3 levels with foraminal stenosis especially at L5-S1 and L4-L5   L3-L4: Asymmetric disc bulge to the left with mild left lateral recess stenosis. No significant neural foraminal stenosis. Mild facet joint arthropathy.   L4-L5: Disc height loss and disc bulge with narrowing of  lateral recesses bilaterally. Mild bilateral neural foraminal stenosis. Mild-to-moderate bilateral facet joint arthropathy.   L5-S1: Broad-based disc bulge with narrowing of lateral recesses bilaterally. Mild bilateral neural foraminal stenosis. Moderate bilateral facet joint arthropathy.   IMPRESSION: 1. No acute fracture or traumatic subluxation. 2. No Suspicious osseous lesion. 3. Degenerate disc disease prominent at L4-L5 and L5-S1 with narrowing of lateral recesses bilaterally. Mild bilateral neural foraminal stenosis and facet joint arthropathy.   Electronically Signed: By: Keane Police D.O. On: 01/01/2022 17:30 BP (!) 191/91   Pulse 98   Ht '5\' 6"'$  (1.676 m)   Wt 266 lb (120.7 kg)   LMP 12/29/2021   BMI 42.93 kg/m  Constitutional: Vital signs stable BMI is greater than 42   General appearance   normal appearance  Cardiovascular:  swelling none varicosities none visible palpation of pulses    normal normal temperature normal edema none tenderness none  Lymph nodes not checked  Skin lower extremities normal  Neuro : coordination normal               deep tendon reflexes as above sensation as above  Psych: alert and oriented x 3 yes    depression none anxiety none none agitation none  Musculoskeletal gait abnormal as above  My exam reveals a patient in severe pain walking with the characteristic abnormal gait She has back pain and tenderness She is decreased sensation L5 and L4 She has extensor weakness of the great toe and dorsiflexion weakness of the  ankle Reflexes 0-1+ on the left     Unfortunately I cannot treat her for this I am going to send her to a back specialist who can manage this as they see fit  Refer to neurosugery

## 2022-01-06 NOTE — Telephone Encounter (Signed)
Left message to advise '30mg'$  tid until gone #90

## 2022-01-10 ENCOUNTER — Encounter (HOSPITAL_COMMUNITY): Payer: Self-pay | Admitting: Physical Therapy

## 2022-01-10 ENCOUNTER — Telehealth: Payer: Self-pay | Admitting: Radiology

## 2022-01-10 NOTE — Progress Notes (Unsigned)
Referring Physician:  Carole Civil, MD [address] [place],  [place] [address]  Primary Physician:  Neale Burly, MD  History of Present Illness: 01/10/2022 [name] is here today with a chief complaint of left sided low back pain with radiation into the left buttock and down the leg along with constant and numbness and tingling down the leg.  Been having pain for 3 years but has worsened over the past 3 to 4 weeks.  She describes pain in the left side of her lower back extending to the lateral side of her leg and anterior and lateral aspect of her calf.  Walking, standing, bending, and lifting make it worse.  She can only walk about 10 feet before she has onset of pain.  Pain can be as bad as 10 out of 10.  Sitting down makes it better but does not completely eliminate her pain.  She denies weakness.   Bowel/Bladder Dysfunction: none  Conservative measures:  Physical therapy: has not participated in Multimodal medical therapy including regular antiinflammatories: hydromorphone, ibuprofen, gabapentin, methocarbamol, tizanidine Injections: has not had any epidural steroid injections  Past Surgery: denies  Marie Mills has no symptoms of cervical myelopathy.  The symptoms are causing a significant impact on the patient's life.   Review of Systems:  A 10 point review of systems is negative, except for the pertinent positives and negatives detailed in the HPI.  Past Medical History: Past Medical History:  Diagnosis Date   BV (bacterial vaginosis) 08/29/2013   Contraceptive management 03/21/2013   GERD (gastroesophageal reflux disease)    Hypertension    Obesity    Vaginal irritation 08/29/2013    Past Surgical History: Past Surgical History:  Procedure Laterality Date   CHOLECYSTECTOMY     COLONOSCOPY WITH PROPOFOL N/A 07/30/2021   Procedure: COLONOSCOPY WITH PROPOFOL;  Surgeon: Eloise Harman, DO;  Location: [facility];  Service: Endoscopy;   Laterality: N/A;  2:45pm   POLYPECTOMY  07/30/2021   Procedure: POLYPECTOMY;  Surgeon: Eloise Harman, DO;  Location: [facility];  Service: Endoscopy;;    Allergies: Allergies as of 01/11/2022 - Review Complete 01/06/2022  Allergen Reaction Noted   Penicillins Anaphylaxis 05/07/2011   Codeine Other (See Comments) 05/07/2011   Sulfa antibiotics Hives 08/29/2013    Medications: No outpatient medications have been marked as taking for the 01/11/22 encounter (Appointment) with Meade Maw, MD.    Social History: Social History   Tobacco Use   Smoking status: Never   Smokeless tobacco: Never  Vaping Use   Vaping Use: Never used  Substance Use Topics   Alcohol use: No   Drug use: No    Family Medical History: Family History  Problem Relation Age of Onset   Hypertension Mother    Hyperlipidemia Mother    Diabetes Mother    Hyperlipidemia Father    Hypertension Father    Diabetes Father    Seizures Father    Diabetes Sister    Hypertension Sister    Diabetes Sister    Hypertension Sister    Hypertension Sister    Diabetes Brother    Hypertension Brother    Asthma Daughter    Asthma Son    Cancer Maternal Aunt        breast   Colon cancer Neg Hx    Colon polyps Neg Hx     Physical Examination: There were no vitals filed for this visit.  General: Patient is well  developed, well nourished, calm, collected, and in no apparent distress. Attention to examination is appropriate.  Neck:   Supple.  Full range of motion.  Respiratory: Patient is breathing without any difficulty.   NEUROLOGICAL:     Awake, alert, oriented to person, place, and time.  Speech is clear and fluent. Fund of knowledge is appropriate.   Cranial Nerves: Pupils equal round and reactive to light.  Facial tone is symmetric.  Facial sensation is symmetric. Shoulder shrug is symmetric. Tongue protrusion is midline.  There is no pronator drift.   Strength: Side Biceps Triceps  Deltoid Interossei Grip Wrist Ext. Wrist Flex.  R '5 5 5 5 5 5 5  '$ L '5 5 5 5 5 5 5   '$ Side Iliopsoas Quads Hamstring PF DF EHL  R '5 5 5 5 5 5  '$ L '5 5 5 5 5 5   '$ Reflexes are 1+ and symmetric at the biceps, triceps, brachioradialis, patella and achilles.   Hoffman's is absent.  Clonus is not present.  Toes are down-going.  Bilateral upper and lower extremity sensation is intact to light touch.    No evidence of dysmetria noted.  Gait is antalgic  Medical Decision Making  Imaging: CT L spine 01/01/22 IMPRESSION: 1. No acute fracture or traumatic subluxation. 2. No suspicious osseous lesion. 3. Degenerate disc disease prominent at L4-L5 and L5-S1 with narrowing of lateral recesses bilaterally. Mild bilateral neural foraminal stenosis and facet joint arthropathy.   Electronically Signed: By: Keane Police D.O. On: 01/01/2022 17:30  I have personally reviewed the images and agree with the above interpretation.  Assessment and Plan: [name] is a pleasant 51 y.o. female with chronic low back pain with more acute pain down her left leg concerning for left L5 radiculopathy.  We do not have confirmatory imaging at this time.  We discussed the work-up and management of this issue.  The majority of patients will have improvement with conservative management including physical therapy, injections, and medications.  I recommended that she continue NSAIDs and muscle relaxants.  She will have to wean off her steroids, as long-term use of steroids may lead her to adverse effects from these medications.  I would like to send her for an injection, but will need an MRI scan of the lumbar spine to help guide this.  I have ordered this study for her.  We will touch base with her after her MRI scan to like recommendations about injections.    She should remain out of work for now.  She is on an altered lifting schedule with lifting limit of 10 pounds.  I spent a total of 30 minutes in face-to-face  and non-face-to-face activities related to this patient's care today.  Thank you for involving me in the care of this patient.      Hodari Chuba K. Izora Ribas MD, [facility]

## 2022-01-10 NOTE — Telephone Encounter (Signed)
Patient called said she sees Dr Izora Ribas for her back tomorrow.  She will get her back looked at then follow up on any knee issues/PT.  She is cancelling PT for now.

## 2022-01-10 NOTE — Therapy (Unsigned)
PHYSICAL THERAPY DISCHARGE SUMMARY  Visits from Start of Care: 1  Current functional level related to goals / functional outcomes: Unknown pt did not return   Remaining deficits: unknown   Education / Equipment: HEP   Patient agrees to discharge. Patient goals were not met. Patient is being discharged due to not returning since the last visit.  PT only came for evaluation.  Rayetta Humphrey, [place] CLT [phone]

## 2022-01-11 ENCOUNTER — Ambulatory Visit: Payer: Medicaid Other | Admitting: Neurosurgery

## 2022-01-11 ENCOUNTER — Encounter (HOSPITAL_COMMUNITY): Payer: Medicaid Other | Admitting: Physical Therapy

## 2022-01-11 ENCOUNTER — Encounter: Payer: Self-pay | Admitting: Neurosurgery

## 2022-01-11 VITALS — BP 150/88 | Ht 66.0 in | Wt 270.2 lb

## 2022-01-11 DIAGNOSIS — G8929 Other chronic pain: Secondary | ICD-10-CM

## 2022-01-11 DIAGNOSIS — M5416 Radiculopathy, lumbar region: Secondary | ICD-10-CM | POA: Diagnosis not present

## 2022-01-11 DIAGNOSIS — M5442 Lumbago with sciatica, left side: Secondary | ICD-10-CM

## 2022-01-13 ENCOUNTER — Ambulatory Visit: Payer: Medicaid Other | Admitting: Orthopedic Surgery

## 2022-01-13 ENCOUNTER — Encounter (HOSPITAL_COMMUNITY): Payer: Medicaid Other | Admitting: Physical Therapy

## 2022-01-14 DIAGNOSIS — J342 Deviated nasal septum: Secondary | ICD-10-CM | POA: Diagnosis not present

## 2022-01-14 DIAGNOSIS — R519 Headache, unspecified: Secondary | ICD-10-CM | POA: Diagnosis not present

## 2022-01-14 DIAGNOSIS — J3489 Other specified disorders of nose and nasal sinuses: Secondary | ICD-10-CM | POA: Diagnosis not present

## 2022-01-18 ENCOUNTER — Encounter (HOSPITAL_COMMUNITY): Payer: Medicaid Other

## 2022-01-18 ENCOUNTER — Telehealth: Payer: Self-pay

## 2022-01-18 NOTE — Telephone Encounter (Signed)
-----   Message from Peggyann Shoals sent at 01/18/2022  9:48 AM EDT ----- Regarding: work note Contact: [phone] Patient's MRI is scheduled until 02/03/2022. Dr.Harrison took her out of work until 02/03/2022. Can you take her out of work for more time or should he reach back out to [place].

## 2022-01-18 NOTE — Telephone Encounter (Signed)
Work note written to be out of work until 02/16/22.

## 2022-01-19 NOTE — Telephone Encounter (Signed)
Patient aware of doctor's updated note. She requested it to be mailed to her since she lives in [place], letter placed in the mail.

## 2022-01-20 ENCOUNTER — Encounter (HOSPITAL_COMMUNITY): Payer: Medicaid Other | Admitting: Physical Therapy

## 2022-01-25 ENCOUNTER — Other Ambulatory Visit: Payer: Self-pay | Admitting: Orthopedic Surgery

## 2022-01-25 ENCOUNTER — Encounter (HOSPITAL_COMMUNITY): Payer: Medicaid Other

## 2022-01-25 DIAGNOSIS — M48061 Spinal stenosis, lumbar region without neurogenic claudication: Secondary | ICD-10-CM

## 2022-01-27 ENCOUNTER — Encounter (HOSPITAL_COMMUNITY): Payer: Medicaid Other | Admitting: Physical Therapy

## 2022-02-01 ENCOUNTER — Encounter (HOSPITAL_COMMUNITY): Payer: Medicaid Other

## 2022-02-03 ENCOUNTER — Telehealth: Payer: Self-pay

## 2022-02-03 ENCOUNTER — Encounter (HOSPITAL_COMMUNITY): Payer: Medicaid Other

## 2022-02-03 ENCOUNTER — Other Ambulatory Visit: Payer: Self-pay | Admitting: Orthopedic Surgery

## 2022-02-03 ENCOUNTER — Ambulatory Visit (HOSPITAL_COMMUNITY)
Admission: RE | Admit: 2022-02-03 | Discharge: 2022-02-03 | Disposition: A | Discharge status: Home / Self Care | Payer: Medicaid Other | Source: Ambulatory Visit | Attending: Neurosurgery | Admitting: Neurosurgery

## 2022-02-03 DIAGNOSIS — M5442 Lumbago with sciatica, left side: Secondary | ICD-10-CM | POA: Diagnosis present

## 2022-02-03 DIAGNOSIS — M5416 Radiculopathy, lumbar region: Secondary | ICD-10-CM | POA: Insufficient documentation

## 2022-02-03 DIAGNOSIS — G8929 Other chronic pain: Secondary | ICD-10-CM | POA: Diagnosis present

## 2022-02-03 MED ORDER — SULINDAC 150 MG PO TABS: 150.0000 mg | ORAL_TABLET | Freq: Two times a day (BID) | ORAL | 1 refills | Status: DC

## 2022-02-03 NOTE — Telephone Encounter (Signed)
Patient had left message earlier today, reached per message - states if possible, would like to have refill on the medication prescribed by Dr Aline Brochure at her first visit 11/17/21:  sulindac (CLINORIL) 150 MG tablet / [facility] on [address], [place]. Patient has seen the Dr Izora Ribas per referral by Dr Aline Brochure; awaiting MRI ordered by Dr Izora Ribas.

## 2022-02-03 NOTE — Telephone Encounter (Signed)
-----   Message from Peggyann Shoals sent at 02/03/2022  9:47 AM EDT ----- Regarding: MRI She had her MRI today. Please call her with the results. She is aware it will be 24-48 hours.

## 2022-02-04 ENCOUNTER — Other Ambulatory Visit: Payer: Medicaid Other | Admitting: Adult Health

## 2022-02-07 ENCOUNTER — Other Ambulatory Visit: Payer: Self-pay | Admitting: Neurosurgery

## 2022-02-07 DIAGNOSIS — M5416 Radiculopathy, lumbar region: Secondary | ICD-10-CM

## 2022-02-07 NOTE — Telephone Encounter (Signed)
She wants to know if this problem could be causing pain on her right side too? She is out of work until 02/16/22 and wants to know if she could continue to stay out until she sees you on 03/01/22?  Patient has been notified about her results and her follow up appt has been made.

## 2022-02-08 ENCOUNTER — Ambulatory Visit (HOSPITAL_COMMUNITY): Payer: Medicaid Other

## 2022-02-08 ENCOUNTER — Encounter (HOSPITAL_COMMUNITY): Payer: Medicaid Other

## 2022-02-08 NOTE — Telephone Encounter (Signed)
She just called back. Her appt with Dr.Lateef on 02/15/22 will only be for a consultation. They will schedule her for an injection at that time due to her having [employer] she can not get an injection at the first appt. She cancelled with Dr.Yarbrough and will reschedule when she gets her injection. I will mail her the letter.

## 2022-02-08 NOTE — Telephone Encounter (Signed)
I have notified the patient about her work note and she is asking if we can mail it to her. Can you please put her note in the mail?

## 2022-02-08 NOTE — Telephone Encounter (Signed)
Noted  

## 2022-02-10 ENCOUNTER — Encounter (HOSPITAL_COMMUNITY): Payer: Medicaid Other | Admitting: Physical Therapy

## 2022-02-11 DIAGNOSIS — E782 Mixed hyperlipidemia: Secondary | ICD-10-CM | POA: Diagnosis not present

## 2022-02-11 DIAGNOSIS — E038 Other specified hypothyroidism: Secondary | ICD-10-CM | POA: Diagnosis not present

## 2022-02-11 DIAGNOSIS — Z131 Encounter for screening for diabetes mellitus: Secondary | ICD-10-CM | POA: Diagnosis not present

## 2022-02-11 DIAGNOSIS — Z79899 Other long term (current) drug therapy: Secondary | ICD-10-CM | POA: Diagnosis not present

## 2022-02-15 ENCOUNTER — Ambulatory Visit
Payer: Medicaid Other | Attending: Student in an Organized Health Care Education/Training Program | Admitting: Student in an Organized Health Care Education/Training Program

## 2022-02-15 ENCOUNTER — Encounter: Payer: Self-pay | Admitting: Student in an Organized Health Care Education/Training Program

## 2022-02-15 ENCOUNTER — Encounter (HOSPITAL_COMMUNITY): Payer: Medicaid Other | Admitting: Physical Therapy

## 2022-02-15 VITALS — BP 161/84 | Temp 97.5°F | Resp 18 | Ht 66.0 in | Wt 266.0 lb

## 2022-02-15 DIAGNOSIS — M5416 Radiculopathy, lumbar region: Secondary | ICD-10-CM

## 2022-02-15 DIAGNOSIS — M48062 Spinal stenosis, lumbar region with neurogenic claudication: Secondary | ICD-10-CM

## 2022-02-15 DIAGNOSIS — G894 Chronic pain syndrome: Secondary | ICD-10-CM | POA: Diagnosis not present

## 2022-02-15 DIAGNOSIS — G8929 Other chronic pain: Secondary | ICD-10-CM | POA: Diagnosis not present

## 2022-02-15 DIAGNOSIS — M5116 Intervertebral disc disorders with radiculopathy, lumbar region: Secondary | ICD-10-CM | POA: Diagnosis not present

## 2022-02-15 NOTE — Progress Notes (Signed)
Patient: Marie Mills  Service Category: E/M  Provider: Gillis Santa, MD  DOB: Aug 20, 1970  DOS: 02/15/2022  Referring Provider: Meade Maw, MD  MRN: [id number]  Setting: Ambulatory outpatient  PCP: Neale Burly, MD  Type: New Patient  Specialty: Interventional Pain Management    Location: Office  Delivery: Face-to-face     Primary Reason(s) for Visit: Encounter for initial evaluation of one or more chronic problems (new to examiner) potentially causing chronic pain, and posing a threat to normal musculoskeletal function. (Level of risk: High) CC: Back Pain  HPI  Marie Mills is a 51 y.o. year old, female patient, who comes for the first time to our practice referred by Meade Maw, MD for our initial evaluation of her chronic pain. She has Essential hypertension; Contraceptive management; Vaginal irritation; BV (bacterial vaginosis); 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 present; Surveillance for birth control, oral contraceptives; Family planning; Screening for colorectal cancer; Encounter for well woman exam with routine gynecological exam; Weight loss counseling, encounter for; Body mass index 45.0-49.9, adult ([place]); Body mass index 40.0-44.9, adult ([place]); Body mass index 38.0-38.9, adult; Body mass index 37.0-37.9, adult; Body mass index 39.0-39.9, adult; Weight gain; Screening mammogram for breast cancer; Routine general medical examination at a health care facility; Encounter for gynecological examination with Papanicolaou smear of cervix; Encounter for surveillance of contraceptive pills; Anxiety and depression; Screening examination for STD (sexually transmitted disease); Epidermal cyst; Encounter for screening colonoscopy; Pain and swelling of right knee; Burning with urination; Visit for suture removal; Superficial fungus infection of skin; Vaginal yeast infection; Vaginal itching; Vaginal burning; Skin excoriation; Acute bilateral low back pain with bilateral sciatica;  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 Today she comes in for evaluation of her Back Pain  Pain Assessment: Location: Lower, Right, Left   Radiating: hips bilateral, down side of legs bilateral to feet, left worse Onset: More than a month ago Duration: Chronic pain Quality: Aching, Constant, Throbbing, Stabbing, Pressure Severity: 4 /10 (subjective, self-reported pain score)  Effect on ADL: prolonge walking, standing, drive, I do what I can Timing: Constant Modifying factors: patient on several meds and would likde a procedure BP: (!) 161/84  HR:    Onset and Duration: Date of onset: 06/23 Cause of pain: Unknown Severity: Getting better, NAS-11 now: 5/10, and NAS-11 on the average: 8/10 Timing: Morning, Afternoon, Night, During activity or exercise, After activity or exercise, and After a period of immobility Aggravating Factors: Bending, Climbing, Intercourse (sex), Kneeling, Lifiting, Prolonged sitting, Prolonged standing, Squatting, Stooping , Twisting, Walking, Walking uphill, Walking downhill, and Working Alleviating Factors: Lying down, Resting, Sitting, and Sleeping Associated Problems: Night-time cramps, Depression, Numbness, Sadness, Spasms, Sweating, Swelling, Weakness, Pain that wakes patient up, and Pain that does not allow patient to sleep Quality of Pain: Annoying, Cramping, and Deep Previous Examinations or Tests: CT scan, EMG/PNCV, MRI scan, and X-rays Previous Treatments: The patient denies na  LBP with radiation into left leg in dermatomal fashion. Started in June, has gotten worse with activity and exertion.  No inciting events.  Negative for trauma.  She is currently on gabapentin 300 mg 3 times a day, ibuprofen 600 mg every 6 hours as needed, lidocaine patch for her low back as well as tizanidine as needed for lumbar paraspinal muscle  spasms.  She has not done physical therapy or any epidural injections.  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  She states that she can walk a limited distance before she has to sit down secondary to weakness in her left leg and associated pain.  She has been evaluated by Dr. Cari Caraway who has recommended a left L3-L4 lumbar epidural steroid injection for left lumbar radicular pain and lumbar spinal stenosis secondary to impressive L3-L4 disc herniation as detailed below on MRI.  She adds that over the last couple of weeks she has started to notice increased pain along her right anterior thigh as well.  I informed her that this visit is an evaluation only about possible interventional pain management options.  In the event that Ms. Chaviano decides not to go with those options, or prefers to stay away from interventional therapies, this will conclude our involvement in the case.     Meds   Current Outpatient Medications:    acetaminophen (TYLENOL) 325 MG tablet, Take 2 tablets (650 mg total) by mouth every 6 (six) hours as needed., Disp: 36 tablet, Rfl: 0   amLODipine (NORVASC) 2.5 MG tablet, TAKE 1 TABLET(2.5 MG) BY MOUTH DAILY (Patient taking differently: Take 2.5 mg by mouth daily.), Disp: 15 tablet, Rfl: 0   aspirin EC 81 MG tablet, Take 81 mg by mouth daily. Swallow whole., Disp: , Rfl:    azelastine (ASTELIN) 0.1 % nasal spray, Place 2 sprays into both nostrils 2 (two) times daily. Use in each nostril as directed, Disp: 30 mL, Rfl: 0   budesonide (PULMICORT) 1 MG/2ML nebulizer solution, Take 1 mg by nebulization daily., Disp: , Rfl:    cetirizine (ZYRTEC) 10 MG tablet, Take 1 tablet (10 mg total) by mouth daily., Disp: 30 tablet, Rfl: 0   doxycycline (VIBRA-TABS) 100 MG tablet, Take 1 tablet (100 mg total) by mouth 2 (two) times daily., Disp: 14 tablet, Rfl: 0   fluticasone (FLONASE) 50 MCG/ACT nasal spray,  Place 1 spray into both nostrils daily., Disp: , Rfl:    gabapentin (NEURONTIN) 300 MG capsule, Take 1 capsule (300 mg total) by mouth 3 (three) times daily., Disp: 90 capsule, Rfl: 5   ibuprofen (ADVIL) 600 MG tablet, Take 1 tablet (600 mg total) by mouth every 6 (six) hours as needed., Disp: 30 tablet, Rfl: 0   ipratropium (ATROVENT) 0.06 % nasal spray, Place 2 sprays into both nostrils 4 (four) times daily., Disp: , Rfl:    lidocaine (LIDODERM) 5 %, Place 1 patch onto the skin daily as needed. Remove & Discard patch within 12 hours or as directed by MD, Disp: 15 patch, Rfl: 0   losartan (COZAAR) 50 MG tablet, Take 50 mg by mouth daily., Disp: , Rfl:    magnesium gluconate (MAGONATE) 500 MG tablet, Take 500 mg by mouth 2 (two) times daily., Disp: , Rfl:    metoprolol succinate (TOPROL-XL) 25 MG 24 hr tablet, Take 25 mg by mouth daily., Disp: , Rfl:    montelukast (SINGULAIR) 10 MG tablet, Take 10 mg by mouth daily., Disp: , Rfl:    Norgestimate-Ethinyl Estradiol Triphasic (TRI-SPRINTEC) 0.18/0.215/0.25 MG-35 MCG tablet, Take 1 tablet by mouth daily., Disp: 84 tablet, Rfl: 4   nystatin cream (MYCOSTATIN), Apply 1 Application topically 2 (two) times daily. (Patient taking differently: Apply 1 Application topically 2 (two) times daily as needed for dry skin.), Disp: 30 g, Rfl: 3   omeprazole (PRILOSEC) 40 MG capsule, TK 1 C PO QD (Patient taking differently: Take 40  mg by mouth daily.), Disp: 30 capsule, Rfl: 6   potassium chloride (KLOR-CON M) 10 MEQ tablet, Take 10 mEq by mouth 2 (two) times daily., Disp: , Rfl:    pyridOXINE (VITAMIN B6) 100 MG tablet, Take 100 mg by mouth daily., Disp: , Rfl:    sulindac (CLINORIL) 150 MG tablet, Take 1 tablet (150 mg total) by mouth 2 (two) times daily., Disp: 60 tablet, Rfl: 1   tiZANidine (ZANAFLEX) 4 MG tablet, TAKE 1 TABLET(4 MG) BY MOUTH THREE TIMES DAILY FOR 20 DAYS, Disp: 30 tablet, Rfl: 1   valACYclovir (VALTREX) 1000 MG tablet, Take 1 tablet (1,000 mg  total) by mouth 2 (two) times daily., Disp: 20 tablet, Rfl: 1  Imaging Review   MR LUMBAR SPINE WO CONTRAST  Narrative CLINICAL DATA:  Low back pain and left leg numbness  EXAM: MRI LUMBAR SPINE WITHOUT CONTRAST  TECHNIQUE: Multiplanar, multisequence MR imaging of the lumbar spine was performed. No intravenous contrast was administered.  COMPARISON:  12/17/2019  FINDINGS: Segmentation:  Standard.  Alignment:  Physiologic.  Vertebrae:  No fracture, evidence of discitis, or bone lesion.  Conus medullaris and cauda equina: Conus extends to the L1 level. Conus and cauda equina appear normal.  Paraspinal and other soft tissues: Negative  Disc levels:  T11-12 and T12-L1 are normal.  L1-L2: Normal disc space and facet joints. No spinal canal stenosis. No neural foraminal stenosis.  L2-L3: Small left asymmetric disc bulge. No spinal canal stenosis. No neural foraminal stenosis.  L3-L4: Large left subarticular disc extrusion with inferior migration to the infrapedicle level with marked mass effect on the left-sided nerve roots in the lateral recess. Severe spinal canal stenosis. No neural foraminal stenosis.  L4-L5: Small central disc protrusion. Mild spinal canal stenosis. Mild left neural foraminal stenosis.  L5-S1: Normal disc space and facet joints. No spinal canal stenosis. No neural foraminal stenosis.  Visualized sacrum: Normal.  IMPRESSION: 1. Large left subarticular disc extrusion at L3-L4 with inferior migration to the infrapedicle level with marked mass effect on the left-sided nerve roots in the lateral recess and severe spinal canal stenosis. 2. Mild spinal canal and left neural foraminal stenosis at L4-L5.   Electronically Signed By: Ulyses Jarred M.D. On: 02/05/2022 13:09  Addendum 01/06/2022  3:43 PM ADDENDUM REPORT: 01/06/2022 15:41  ADDENDUM: There is a typo in the item number 2 of the impression, No suspicious osseous  lesion.   Electronically Signed By: Keane Police D.O. On: 01/06/2022 15:41  Narrative CLINICAL DATA:  Low back pain, cancer suspected  EXAM: CT LUMBAR SPINE WITHOUT CONTRAST  TECHNIQUE: Multidetector CT imaging of the lumbar spine was performed without intravenous contrast administration. Multiplanar CT image reconstructions were also generated.  RADIATION DOSE REDUCTION: This exam was performed according to the departmental dose-optimization program which includes automated exposure control, adjustment of the mA and/or kV according to patient size and/or use of iterative reconstruction technique.  COMPARISON:  Radiograph performed earlier on the same date.  FINDINGS: Segmentation: 5 lumbar type vertebrae.  Alignment: Normal.  Vertebrae: No acute fracture or focal pathologic process.  Paraspinal and other soft tissues: Negative.  Disc levels:  T12-L1: No significant finding.  L1-L2: No significant findings.  L2-L3: No significant findings. l  L3-L4: Asymmetric disc bulge to the left with mild left lateral recess stenosis. No significant neural foraminal stenosis. Mild facet joint arthropathy.  L4-L5: Disc height loss and disc bulge with narrowing of lateral recesses bilaterally. Mild bilateral neural foraminal stenosis. Mild-to-moderate bilateral facet  joint arthropathy.  L5-S1: Broad-based disc bulge with narrowing of lateral recesses bilaterally. Mild bilateral neural foraminal stenosis. Moderate bilateral facet joint arthropathy.  IMPRESSION: 1. No acute fracture or traumatic subluxation. 2. Suspicious osseous lesion. 3. Degenerate disc disease prominent at L4-L5 and L5-S1 with narrowing of lateral recesses bilaterally. Mild bilateral neural foraminal stenosis and facet joint arthropathy.  Electronically Signed: By: Keane Police D.O. On: 01/01/2022 17:30  Narrative CLINICAL DATA:  Lower back pain  EXAM: LUMBAR SPINE - COMPLETE 4+  VIEW  COMPARISON:  11/15/2019  FINDINGS: Normal alignment. Vertebral body heights are well preserved. No signs of acute fracture. There is mild disc space narrowing and endplate spurring noted at L2-3, L3-4, L4-5.  IMPRESSION: Mild multilevel degenerative disc disease.   Electronically Signed By: Kerby Moors M.D. On: 01/01/2022 11:20  DG Hip Unilat W or Wo Pelvis 2-3 Views Left  Narrative CLINICAL DATA:  Left hip pain for 4 days.  EXAM: DG HIP (WITH OR WITHOUT PELVIS) 2-3V LEFT  COMPARISON:  None.  FINDINGS: Pelvic bony ring is intact. Left hip is located without a fracture. No gross abnormality to the right hip. Gas and stool in the colon.  IMPRESSION: No acute abnormality.   Electronically Signed By: Markus Daft M.D. On: 02/20/2016 13:39  DG Hand Complete Right  Narrative *RADIOLOGY REPORT*  Clinical Data: Laceration  RIGHT HAND - COMPLETE 3+ VIEW  Comparison: None.  Findings: Three views of the right hand submitted.  No acute fracture or subluxation.  No radiopaque foreign body.  Suboptimal study due to flexed position of the fifth finger.  IMPRESSION: No acute fracture or subluxation.  No radiopaque foreign body.   Original Report Authenticated By: Lahoma Crocker, M.D.     Complexity Note: Imaging results reviewed.                         ROS  Cardiovascular: Daily Aspirin intake, High blood pressure, and Needs antibiotics prior to dental procedures Pulmonary or Respiratory: Wheezing and difficulty taking a deep full breath (Asthma) and Snoring  Neurological: No reported neurological signs or symptoms such as seizures, abnormal skin sensations, urinary and/or fecal incontinence, being born with an abnormal open spine and/or a tethered spinal cord Psychological-Psychiatric: Depressed and Prone to panicking Gastrointestinal: No reported gastrointestinal signs or symptoms such as vomiting or evacuating blood, reflux, heartburn, alternating episodes  of diarrhea and constipation, inflamed or scarred liver, or pancreas or irrregular and/or infrequent bowel movements Genitourinary: Passing kidney stones Hematological: No reported hematological signs or symptoms such as prolonged bleeding, low or poor functioning platelets, bruising or bleeding easily, hereditary bleeding problems, low energy levels due to low hemoglobin or being anemic Endocrine: No reported endocrine signs or symptoms such as high or low blood sugar, rapid heart rate due to high thyroid levels, obesity or weight gain due to slow thyroid or thyroid disease Rheumatologic: No reported rheumatological signs and symptoms such as fatigue, joint pain, tenderness, swelling, redness, heat, stiffness, decreased range of motion, with or without associated rash Musculoskeletal: Negative for myasthenia gravis, muscular dystrophy, multiple sclerosis or malignant hyperthermia Work History: Working full time  Allergies  Ms. Squibb is allergic to penicillins, codeine, and sulfa antibiotics.  Laboratory Chemistry Profile   Renal Lab Results  Component Value Date   BUN 18 07/01/2020   CREATININE 0.59 07/01/2020   GFRAA >60 04/20/2018   GFRNONAA >60 07/01/2020   SPECGRAV 1.026 12/06/2021   PHUR 5.5 12/06/2021   PROTEINUR  30 (A) 01/01/2022     Electrolytes Lab Results  Component Value Date   NA 137 07/01/2020   K 3.8 07/01/2020   CL 106 07/01/2020   CALCIUM 8.7 (L) 07/01/2020     Hepatic No results found for: "AST", "ALT", "ALBUMIN", "ALKPHOS", "AMYLASE", "LIPASE", "AMMONIA"   ID Lab Results  Component Value Date   HIV Non Reactive 05/31/2017   [place] NEGATIVE 09/11/2021   PREGTESTUR NEGATIVE 07/28/2021     Bone No results found for: "VD25OH", "VD125OH2TOT", "JJ0093GH8", "EX9371IR6", "25OHVITD1", "25OHVITD2", "78LFYBOF7", "TESTOFREE", "TESTOSTERONE"   Endocrine Lab Results  Component Value Date   GLUCOSE 110 (H) 07/01/2020   GLUCOSEU NEGATIVE 01/01/2022      Neuropathy Lab Results  Component Value Date   HIV Non Reactive 05/31/2017     CNS No results found for: "COLORCSF", "APPEARCSF", "RBCCOUNTCSF", "WBCCSF", "POLYSCSF", "LYMPHSCSF", "EOSCSF", "PROTEINCSF", "GLUCCSF", "JCVIRUS", "CSFOLI", "IGGCSF", "LABACHR", "ACETBL"   Inflammation (CRP: Acute  ESR: Chronic) No results found for: "CRP", "ESRSEDRATE", "LATICACIDVEN"   Rheumatology No results found for: "RF", "ANA", "LABURIC", "URICUR", "LYMEIGGIGMAB", "LYMEABIGMQN", "HLAB27"   Coagulation Lab Results  Component Value Date   PLT 253 06/29/2020   DDIMER 0.32 04/20/2018     Cardiovascular Lab Results  Component Value Date   TROPONINI <0.03 06/09/2015   HGB 12.8 06/29/2020   HCT 39.3 06/29/2020     Screening Lab Results  Component Value Date   SARSCOV2NAA NEGATIVE 09/11/2021   HIV Non Reactive 05/31/2017   PREGTESTUR NEGATIVE 07/28/2021     Cancer No results found for: "CEA", "CA125", "LABCA2"   Allergens No results found for: "ALMOND", "APPLE", "ASPARAGUS", "AVOCADO", "BANANA", "BARLEY", "BASIL", "BAYLEAF", "GREENBEAN", "LIMABEAN", "WHITEBEAN", "BEEFIGE", "REDBEET", "BLUEBERRY", "BROCCOLI", "CABBAGE", "MELON", "CARROT", "CASEIN", "CASHEWNUT", "CAULIFLOWER", "CELERY"     Note: Lab results reviewed.    Drug: Ms. Kaczmarczyk  reports no history of drug use. Alcohol:  reports no history of alcohol use. Tobacco:  reports that she has never smoked. She has never used smokeless tobacco. Medical:  has a past medical history of BV (bacterial vaginosis) (08/29/2013), Contraceptive management (03/21/2013), GERD (gastroesophageal reflux disease), Hypertension, Obesity, and Vaginal irritation (08/29/2013). Family: family history includes Asthma in her daughter and son; Cancer in her maternal aunt; Diabetes in her brother, father, mother, sister, and sister; Hyperlipidemia in her father and mother; Hypertension in her brother, father, mother, sister, sister, and sister; Seizures in her  father.  Past Surgical History:  Procedure Laterality Date   CHOLECYSTECTOMY     COLONOSCOPY WITH PROPOFOL N/A 07/30/2021   Procedure: COLONOSCOPY WITH PROPOFOL;  Surgeon: Eloise Harman, DO;  Location: [facility];  Service: Endoscopy;  Laterality: N/A;  2:45pm   POLYPECTOMY  07/30/2021   Procedure: POLYPECTOMY;  Surgeon: Eloise Harman, DO;  Location: [facility];  Service: Endoscopy;;   Active Ambulatory Problems    Diagnosis Date Noted   Essential hypertension 03/21/2013   Contraceptive management 03/21/2013   Vaginal irritation 08/29/2013   BV (bacterial vaginosis) 08/29/2013   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 present 05/30/2016   Surveillance for birth control, oral contraceptives 05/30/2016   Family planning 05/30/2016   Screening for colorectal cancer 05/30/2016   Encounter for well woman exam with routine gynecological exam 10/08/2018   Weight loss counseling, encounter for 10/08/2018   Body mass index 45.0-49.9, adult ([place]) 10/08/2018   Body mass index 40.0-44.9, adult ([place]) 11/01/2018   Body mass index 38.0-38.9, adult 02/04/2019   Body mass index 37.0-37.9, adult 04/08/2019   Body  mass index 39.0-39.9, adult 05/28/2019   Weight gain 07/04/2019   Screening mammogram for breast cancer 02/03/2021   Routine general medical examination at a health care facility 02/03/2021   Encounter for gynecological examination with Papanicolaou smear of cervix 02/03/2021   Encounter for surveillance of contraceptive pills 02/03/2021   Anxiety and depression 02/03/2021   Screening examination for STD (sexually transmitted disease) 05/06/2021   Epidermal cyst 05/06/2021   Encounter for screening colonoscopy 06/29/2021   Pain and swelling of right knee 10/29/2021   Burning with urination 11/16/2021   Visit for suture removal 12/02/2021   Superficial fungus infection of skin 12/06/2021   Vaginal yeast infection 12/06/2021   Vaginal itching 12/06/2021   Vaginal burning  12/06/2021   Skin excoriation 12/14/2021   Acute bilateral low back pain with bilateral sciatica 12/30/2021   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) 02/15/2022   Lumbar radiculopathy 02/15/2022   Spinal stenosis of lumbar region with neurogenic claudication 02/15/2022   Chronic pain syndrome 02/15/2022   Resolved Ambulatory Problems    Diagnosis Date Noted   No Resolved Ambulatory Problems   Past Medical History:  Diagnosis Date   GERD (gastroesophageal reflux disease)    Hypertension    Obesity    Constitutional Exam  General appearance: alert, cooperative, in mild distress, and moderately obese Vitals:   02/15/22 0927  BP: (!) 161/84  Resp: 18  Temp: (!) 97.5 F (36.4 C)  SpO2: 99%  Weight: 266 lb (120.7 kg)  Height: _0  (1.676 m)   BMI Assessment: Estimated body mass index is 42.93 kg/m as calculated from the following:   Height as of this encounter: _1  (1.676 m).   Weight as of this encounter: 266 lb (120.7 kg).  BMI interpretation table: BMI level Category Range association with higher incidence of chronic pain  <18 kg/m2 Underweight   18.5-24.9 kg/m2 Ideal body weight   25-29.9 kg/m2 Overweight Increased incidence by 20%  30-34.9 kg/m2 Obese (Class I) Increased incidence by 68%  35-39.9 kg/m2 Severe obesity (Class II) Increased incidence by 136%  >40 kg/m2 Extreme obesity (Class III) Increased incidence by 254%   Patient's current BMI Ideal Body weight  Body mass index is 42.93 kg/m. Ideal body weight: 59.3 kg (130 lb 11.7 oz) Adjusted ideal body weight: 83.8 kg (184 lb 13.4 oz)   BMI Readings from Last 4 Encounters:  02/15/22 42.93 kg/m  01/11/22 43.61 kg/m  01/06/22 42.93 kg/m  01/03/22 42.93 kg/m   Wt Readings from Last 4 Encounters:  02/15/22 266 lb (120.7 kg)  01/11/22 270 lb 3.2 oz (122.6 kg)  01/06/22 266 lb (120.7 kg)  01/03/22 266 lb (120.7 kg)    Psych/Mental status: Alert, oriented x 3 (person, place, & time)       Eyes:  PERLA Respiratory: No evidence of acute respiratory distress  Lumbar Spine Area Exam  Skin & Axial Inspection: No masses, redness, or swelling Alignment: Symmetrical Functional ROM: Pain restricted ROM affecting primarily the left Stability: No instability detected Muscle Tone/Strength: Functionally intact. No obvious neuro-muscular anomalies detected. Sensory (Neurological): Dermatomal pain pattern left L3-L4 Palpation: No palpable anomalies       Provocative Tests: Hyperextension/rotation test: deferred today       Lumbar quadrant test (Kemp's test): (+) on the left for foraminal stenosis Lateral bending test: (+) ipsilateral radicular pain, on the left. Positive for left-sided foraminal stenosis.  Gait & Posture Assessment  Ambulation: Unassisted Gait: Relatively normal for age and body habitus Posture:  WNL  Lower Extremity Exam    Side: Right lower extremity  Side: Left lower extremity  Stability: No instability observed          Stability: No instability observed          Skin & Extremity Inspection: Skin color, temperature, and hair growth are WNL. No peripheral edema or cyanosis. No masses, redness, swelling, asymmetry, or associated skin lesions. No contractures.  Skin & Extremity Inspection: Skin color, temperature, and hair growth are WNL. No peripheral edema or cyanosis. No masses, redness, swelling, asymmetry, or associated skin lesions. No contractures.  Functional ROM: Unrestricted ROM                  Functional ROM: Pain restricted ROM for hip and knee joints          Muscle Tone/Strength: Functionally intact. No obvious neuro-muscular anomalies detected.  Muscle Tone/Strength: Functionally intact. No obvious neuro-muscular anomalies detected.  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  Visit Diagnosis (New problems to examiner): 1. 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 Spinal stenosis of lumbar region with neurogenic claudication   4. Lumbar radiculopathy   5. Chronic pain syndrome    Plan of Care  I reviewed the patient's MRI with her in great detail.  She has an impressive left disc herniation at L3-L4 that is affecting her left L3 and L4 spinal nerve roots.  She has associated severe spinal stenosis and neurogenic claudication.  Discussed left L3-L4 lumbar epidural steroid injection.  Risk and benefits reviewed and patient would like to proceed.  Also placed referral for physical therapy.   1. 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 Spinal stenosis of lumbar region with neurogenic claudication - Ambulatory referral to Physical Therapy - Lumbar Epidural Injection; Future  4. Lumbar radiculopathy - Ambulatory referral to Physical Therapy - Lumbar Epidural Injection; Future  5. Chronic pain syndrome -continue with multimodal analgesics with PCP   Referral Orders         Ambulatory referral to Physical Therapy     Procedure Orders         Lumbar Epidural Injection          Provider-requested  follow-up: Return in about 2 weeks (around 03/01/2022) for  LEFT L3/4 ESI, in clinic (PO Valium).  Future Appointments  Date Time Provider [place]  03/08/2022  9:00 AM Ardis Rowan, PT [facility] None  03/23/2022  2:30 PM Estill Dooms, NP [facility]   I spent a total of 60 minutes reviewing chart data, face-to-face evaluation with the patient, counseling and coordination of care as detailed above.   Note by: Gillis Santa, MD Date: 02/15/2022;  Time: 10:28 AM

## 2022-02-15 NOTE — Progress Notes (Signed)
Safety precautions to be maintained throughout the outpatient stay will include: orient to surroundings, keep bed in low position, maintain call bell within reach at all times, provide assistance with transfer out of bed and ambulation.  

## 2022-02-17 ENCOUNTER — Encounter (HOSPITAL_COMMUNITY): Payer: Medicaid Other | Admitting: Physical Therapy

## 2022-02-18 ENCOUNTER — Other Ambulatory Visit: Payer: Self-pay | Admitting: Orthopedic Surgery

## 2022-02-18 DIAGNOSIS — M48061 Spinal stenosis, lumbar region without neurogenic claudication: Secondary | ICD-10-CM

## 2022-02-22 ENCOUNTER — Telehealth: Payer: Self-pay

## 2022-02-22 NOTE — Telephone Encounter (Signed)
-----   Message from Peggyann Shoals sent at 02/22/2022  1:16 PM EST ----- Regarding: injection Contact: [phone] Patient went to [facility] on 10/31 and was told that they can not order the injection until she completes 6 weeks of PT per Medicaid. Patient is stressed out because she is in pain, she will not start PT until 11/21, and she knows she needs surgery. She has already been out of work for a while and does not want to lose her job. She just wants to make sure that the pain clinic is telling her the truth about medicaid not approving the injection until she has PT. She was told that medicaid would not approve her MRI until she had PT and they approved it anyways. What advice can you give her?

## 2022-02-22 NOTE — Telephone Encounter (Signed)
Left message I will call her tomorrow.

## 2022-02-23 ENCOUNTER — Other Ambulatory Visit: Payer: Self-pay | Admitting: Adult Health

## 2022-02-23 NOTE — Telephone Encounter (Signed)
Patient wants to try at least 1 injection first before considering surgery. She will update the office with her PT progress when she starts. She is on PT's wait list.  She did call around other PT offices but none take Medicaid.  Can you give her a new work note to be out until she finishes up with PT?

## 2022-02-23 NOTE — Telephone Encounter (Signed)
Left message to call back  

## 2022-02-23 NOTE — Telephone Encounter (Signed)
Letter placed on your desk.

## 2022-02-24 ENCOUNTER — Other Ambulatory Visit: Payer: Self-pay | Admitting: Adult Health

## 2022-02-24 NOTE — Telephone Encounter (Signed)
Patient notified that the letter was ready and requested it to be mailed to her. Letter mailed out.

## 2022-03-01 ENCOUNTER — Ambulatory Visit: Payer: Medicaid Other | Admitting: Neurosurgery

## 2022-03-03 ENCOUNTER — Encounter: Payer: Self-pay | Admitting: Orthopedic Surgery

## 2022-03-03 ENCOUNTER — Ambulatory Visit: Payer: Medicaid Other | Admitting: Orthopedic Surgery

## 2022-03-03 VITALS — Ht 66.0 in | Wt 266.0 lb

## 2022-03-03 DIAGNOSIS — G8929 Other chronic pain: Secondary | ICD-10-CM

## 2022-03-03 DIAGNOSIS — M25561 Pain in right knee: Secondary | ICD-10-CM | POA: Diagnosis not present

## 2022-03-03 DIAGNOSIS — M23321 Other meniscus derangements, posterior horn of medial meniscus, right knee: Secondary | ICD-10-CM | POA: Diagnosis not present

## 2022-03-03 NOTE — Progress Notes (Signed)
FOLLOW UP   Encounter Diagnoses  Name Primary?   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  She denies any trauma.  Complains of diffuse knee pain anteriorly and posteriorly as well.  There is no localization is in terms of medial or lateral chest diffuse pain all over the joint.  She did receive an anti-inflammatory from primary care she said did not help. IT WAS DICLOFENAC    Rose has been evaluated for the spine issue but today she is complaining of pain in the right knee and numbness over the anterior aspect of the right knee.  She does not report any new trauma  When I saw her in August she was sent for physical therapy for ongoing pain which came on without any trauma.  She had anterior and posterior knee pain which was diffuse.  Her examination revealed medial and lateral joint line tenderness x-rays were normal with normal alignment possible patellofemoral bone spur indicating arthritis she was put on Clinoril 150 mg twice a day  Reexamination she is tender over the medial joint line she has numbness over the front of the knee which I cannot explain she seems to have normal range of motion a pause McMurray's sign and negative drawer test and normal collateral ligaments  Recommend MRI to evaluate torn medial meniscus  Encounter Diagnoses  Name Primary?   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

## 2022-03-03 NOTE — Patient Instructions (Signed)
While we are working on your approval for MRI please go ahead and call to schedule your appointment with [facility] within at least one (1) week.   Central Scheduling [phone]  

## 2022-03-07 NOTE — Therapy (Signed)
OUTPATIENT PHYSICAL THERAPY THORACOLUMBAR EVALUATION   Patient Name: Marie Mills MRN: [id number] DOB:13-Oct-1970, 51 y.o., female Today's Date: 03/08/2022  END OF SESSION:  PT End of Session - 03/08/22 0911     Visit Number 1    Number of Visits 8    Date for PT Re-Evaluation 04/05/22    Authorization Type [place] Medicaid [employer]- authorization put in    PT Start Time 0900    PT Stop Time 0940    PT Time Calculation (min) 40 min             Past Medical History:  Diagnosis Date   BV (bacterial vaginosis) 08/29/2013   Contraceptive management 03/21/2013   GERD (gastroesophageal reflux disease)    Hypertension    Obesity    Vaginal irritation 08/29/2013   Past Surgical History:  Procedure Laterality Date   CHOLECYSTECTOMY     COLONOSCOPY WITH PROPOFOL N/A 07/30/2021   Procedure: COLONOSCOPY WITH PROPOFOL;  Surgeon: Eloise Harman, DO;  [facility];  Service: Endoscopy;  Laterality: N/A;  2:45pm   POLYPECTOMY  07/30/2021   Procedure: POLYPECTOMY;  Surgeon: Eloise Harman, DO;  Location[facility];  Service: Endoscopy;;   Patient Active Problem List   Diagnosis Date Noted   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) 02/15/2022   Lumbar radiculopathy 02/15/2022   Spinal stenosis of lumbar region with neurogenic claudication 02/15/2022   Chronic pain syndrome 02/15/2022   Acute bilateral low back pain with bilateral sciatica 12/30/2021   Skin excoriation 12/14/2021   Superficial fungus infection of skin 12/06/2021   Vaginal yeast infection 12/06/2021   Vaginal itching 12/06/2021   Vaginal burning 12/06/2021   Visit for suture removal 12/02/2021   Burning with urination 11/16/2021   Pain and swelling of right knee 10/29/2021   Encounter for screening colonoscopy 06/29/2021   Screening examination for STD (sexually transmitted disease) 05/06/2021   Epidermal cyst 05/06/2021   Screening mammogram for breast cancer 02/03/2021   Routine general  medical examination at a health care facility 02/03/2021   Encounter for gynecological examination with Papanicolaou smear of cervix 02/03/2021   Encounter for surveillance of contraceptive pills 02/03/2021   Anxiety and depression 02/03/2021   Weight gain 07/04/2019   Body mass index 39.0-39.9, adult 05/28/2019   Body mass index 37.0-37.9, adult 04/08/2019   Body mass index 38.0-38.9, adult 02/04/2019   Body mass index 40.0-44.9, adult ([place]) 11/01/2018   Encounter for well woman exam with routine gynecological exam 10/08/2018   Weight loss counseling, encounter for 10/08/2018   Body mass index 45.0-49.9, adult ([place]) 10/08/2018   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 present 05/30/2016   Surveillance for birth control, oral contraceptives 05/30/2016   Family planning 05/30/2016   Screening for colorectal cancer 05/30/2016   Vaginal irritation 08/29/2013   BV (bacterial vaginosis) 08/29/2013   Essential hypertension 03/21/2013   Contraceptive management 03/21/2013    PCP: Neale Burly, MD  REFERRING PROVIDER: Gillis Santa, MD  REFERRING DIAG: M54.16,G89.29 (ICD-10-CM) - Chronic radicular lumbar pain M51.16 (ICD-10-CM) - Lumbar disc herniation with radiculopathy M48.062 (ICD-10-CM) - Spinal stenosis of lumbar region with neurogenic claudication M54.16 (ICD-10-CM) - Lumbar radiculopathy  Rationale for Evaluation and Treatment: Rehabilitation  THERAPY DIAG:  Low back pain, unspecified back pain laterality, unspecified chronicity, unspecified whether sciatica present  Difficulty in walking, not elsewhere classified  Other symptoms and signs involving the musculoskeletal system  Acute pain of right knee  Pain in left leg  ONSET DATE: 6 months  SUBJECTIVE:                                                                                                                                                                                           SUBJECTIVE STATEMENT: Back and  both knees hurt.  Had started to come to therapy for her knee but her back was hurting so badly could not do it; want her to try therapy first before trying injections in her back; also treated for her knee. Right knee pain continues  PERTINENT HISTORY:  CNA  PAIN:  Are you having pain? Yes: NPRS scale: 4/10 Pain location: low back and into left leg Pain description: aggravating, sore, burning, throbbing Aggravating factors: walking, standing Relieving factors: sitting  PRECAUTIONS: None  WEIGHT BEARING RESTRICTIONS: No  FALLS:  Has patient fallen in last 6 months? No  LIVING ENVIRONMENT: Lives with: lives with their daughter Lives in: House/apartment Stairs: No Has following equipment at home: Environmental consultant - 2 wheeled  Rossie for 10 years  PLOF: Independent  PATIENT GOALS: decreased back pain  NEXT MD VISIT: next month  OBJECTIVE:   DIAGNOSTIC FINDINGS:  CLINICAL DATA:  Low back pain and left leg numbness   EXAM: MRI LUMBAR SPINE WITHOUT CONTRAST   TECHNIQUE: Multiplanar, multisequence MR imaging of the lumbar spine was performed. No intravenous contrast was administered.   COMPARISON:  12/17/2019   FINDINGS: Segmentation:  Standard.   Alignment:  Physiologic.   Vertebrae:  No fracture, evidence of discitis, or bone lesion.   Conus medullaris and cauda equina: Conus extends to the L1 level. Conus and cauda equina appear normal.   Paraspinal and other soft tissues: Negative   Disc levels:   T11-12 and T12-L1 are normal.   L1-L2: Normal disc space and facet joints. No spinal canal stenosis. No neural foraminal stenosis.   L2-L3: Small left asymmetric disc bulge. No spinal canal stenosis. No neural foraminal stenosis.   L3-L4: Large left subarticular disc extrusion with inferior migration to the infrapedicle level with marked mass effect on the left-sided nerve roots in the lateral recess. Severe spinal canal stenosis. No neural foraminal  stenosis.   L4-L5: Small central disc protrusion. Mild spinal canal stenosis. Mild left neural foraminal stenosis.   L5-S1: Normal disc space and facet joints. No spinal canal stenosis. No neural foraminal stenosis.   Visualized sacrum: Normal.   IMPRESSION: 1. Large left subarticular disc extrusion at L3-L4 with inferior migration to the infrapedicle level with marked mass effect on the left-sided nerve roots in the lateral recess and severe spinal canal stenosis. 2. Mild spinal canal and left  neural foraminal stenosis at L4-L5.     Electronically Signed   By: Ulyses Jarred M.D.   On: 02/05/2022 13:09  PATIENT SURVEYS:  FOTO 22   COGNITION: Overall cognitive status: Within functional limits for tasks assessed     SENSATION: Both legs; left leg stays numb; right side occasionally goes numb  POSTURE: rounded shoulders, forward head, and decreased lumbar lordosis  PALPATION: Tender L3-S1 area  LUMBAR ROM:   AROM eval  Flexion 70% *available  Extension 40% available*  Right lateral flexion   Left lateral flexion   Right rotation   Left rotation    (Blank rows = not tested)    LOWER EXTREMITY MMT:    MMT Right eval Left eval  Hip flexion 4+ 3+*  Hip extension 4 3-  Hip abduction    Hip adduction    Hip internal rotation    Hip external rotation    Knee flexion 4+ 4-  Knee extension 4+ 4*  Ankle dorsiflexion 5 4*  Ankle plantarflexion    Ankle inversion    Ankle eversion     (Blank rows = not tested)    FUNCTIONAL TESTS:  5 times sit to stand: 19.75 sec  GAIT: Distance walked: 80 ft Assistive device utilized: None Level of assistance: SBA Comments: decreased stance phase left leg; antalgic gait and decreased gait speed  TODAY'S TREATMENT:                                                                                                                              DATE:  Physical therapy evaluation and HEP instruction    Education details:  Patient educated on exam findings, POC, scope of PT, HEP. Person educated: Patient Education method: Explanation, Demonstration, and Handouts Education comprehension: verbalized understanding, returned demonstration, verbal cues required, and tactile cues required  HOME EXERCISE PROGRAM: Access Code: [email or web address] URL: [email or web address] Date: 03/08/2022 Prepared by: [facility]  Exercises - Lying Prone  - 1 x daily - 7 x weekly - 3 sets - 10 reps  ASSESSMENT:  CLINICAL IMPRESSION: Patient is a 51 y.o. female who was seen today for physical therapy evaluation and treatment for . M54.16,G89.29 (ICD-10-CM) - Chronic radicular lumbar pain M51.16 (ICD-10-CM) - Lumbar disc herniation with radiculopathy M48.062 (ICD-10-CM) - Spinal stenosis of lumbar region with neurogenic claudication M54.16 (ICD-10-CM) - Lumbar radiculopathyM25.561 (ICD-10-CM) - Acute pain of right knee. Patient demonstrates muscle weakness, reduced ROM, and fascial restrictions which are likely contributing to symptoms of pain and are negatively impacting patient ability to perform ADLs and functional mobility tasks. Patient will benefit from skilled physical therapy services to address these deficits to reduce pain and improve level of function with ADLs and functional mobility tasks.   OBJECTIVE IMPAIRMENTS: Abnormal gait, decreased activity tolerance, decreased balance, decreased coordination, decreased endurance, decreased knowledge of condition, decreased mobility, difficulty walking, decreased ROM, decreased strength, hypomobility, increased fascial restrictions, impaired perceived functional ability, increased muscle spasms, impaired  flexibility, impaired sensation, impaired tone, and pain.   ACTIVITY LIMITATIONS: carrying, lifting, bending, sitting, standing, squatting, sleeping, stairs, transfers, bed mobility, bathing, toileting, dressing, reach over head, hygiene/grooming, locomotion level, and caring for  others  PARTICIPATION LIMITATIONS: meal prep, cleaning, laundry, driving, shopping, community activity, occupation, and yard work  [employer] POTENTIAL: Good  CLINICAL DECISION MAKING: Stable/uncomplicated  EVALUATION COMPLEXITY: Low   GOALS: Goals reviewed with patient? No  SHORT TERM GOALS: Target date: 03/22/2022  Patient will be independent with initial HEP  Baseline: Goal status: INITIAL  2.  Patient will improve 5 times sit to stand score from 19.75 sec to 17 sec to demonstrate improved functional mobility and increased lower extremity strength.  Baseline:  Goal status: INITIAL  LONG TERM GOALS: Target date: 04/05/2022  Patient will be independent in self management strategies to improve quality of life and functional outcomes.  Baseline:  Goal status: INITIAL  2.   Patient will report at least 50% improvement in overall symptoms and/or function to demonstrate improved functional mobility  Baseline:  Goal status: INITIAL  3.  Patient will improve 5 times sit to stand score from 19.75 sec to 15 sec to demonstrate improved functional mobility and increased lower extremity strength.  Baseline:  Goal status: INITIAL  4.  Patient will increase lower extremity MMTs to 4+-5/5 without pain to promote return to ambulation community distances with minimal deviation.  Baseline:  Goal status: INITIAL  PLAN:  PT FREQUENCY: 2x/week  PT DURATION: 4 weeks  Therapeutic exercises, Therapeutic activity, Neuromuscular re-education, Balance training, Gait training, Patient/Family education, Joint manipulation, Joint mobilization, Stair training, Orthotic/Fit training, DME instructions, Aquatic Therapy, Dry Needling, Electrical stimulation, Spinal manipulation, Spinal mobilization, Cryotherapy, Moist heat, Compression bandaging, scar mobilization, Splintting, Taping, Traction, Ultrasound, Ionotophoresis '4mg'$ /ml Dexamethasone, and Manual therapy   PLAN FOR NEXT SESSION: Review HEP and  goals, progress right knee mobility and strength, lumbar mobility and core strengthening.   10:53 AM, 03/08/22 Yasheka Fossett Small Claudine Stallings MPT [place] [facility] [place] [phone]

## 2022-03-08 ENCOUNTER — Ambulatory Visit (HOSPITAL_COMMUNITY): Payer: Medicaid Other | Attending: Orthopedic Surgery

## 2022-03-08 DIAGNOSIS — M48062 Spinal stenosis, lumbar region with neurogenic claudication: Secondary | ICD-10-CM | POA: Insufficient documentation

## 2022-03-08 DIAGNOSIS — R262 Difficulty in walking, not elsewhere classified: Secondary | ICD-10-CM | POA: Diagnosis not present

## 2022-03-08 DIAGNOSIS — M5116 Intervertebral disc disorders with radiculopathy, lumbar region: Secondary | ICD-10-CM | POA: Insufficient documentation

## 2022-03-08 DIAGNOSIS — M79605 Pain in left leg: Secondary | ICD-10-CM

## 2022-03-08 DIAGNOSIS — M25561 Pain in right knee: Secondary | ICD-10-CM

## 2022-03-08 DIAGNOSIS — M5442 Lumbago with sciatica, left side: Secondary | ICD-10-CM | POA: Insufficient documentation

## 2022-03-08 DIAGNOSIS — M5416 Radiculopathy, lumbar region: Secondary | ICD-10-CM | POA: Insufficient documentation

## 2022-03-08 DIAGNOSIS — M6281 Muscle weakness (generalized): Secondary | ICD-10-CM | POA: Diagnosis not present

## 2022-03-08 DIAGNOSIS — R29898 Other symptoms and signs involving the musculoskeletal system: Secondary | ICD-10-CM | POA: Diagnosis not present

## 2022-03-08 DIAGNOSIS — G8929 Other chronic pain: Secondary | ICD-10-CM | POA: Insufficient documentation

## 2022-03-08 DIAGNOSIS — M545 Low back pain, unspecified: Secondary | ICD-10-CM

## 2022-03-11 ENCOUNTER — Other Ambulatory Visit: Payer: Self-pay | Admitting: Orthopedic Surgery

## 2022-03-11 DIAGNOSIS — M48061 Spinal stenosis, lumbar region without neurogenic claudication: Secondary | ICD-10-CM

## 2022-03-15 ENCOUNTER — Ambulatory Visit (HOSPITAL_COMMUNITY): Payer: Medicaid Other

## 2022-03-15 ENCOUNTER — Encounter (HOSPITAL_COMMUNITY): Payer: Self-pay

## 2022-03-15 DIAGNOSIS — M5416 Radiculopathy, lumbar region: Secondary | ICD-10-CM | POA: Diagnosis not present

## 2022-03-15 DIAGNOSIS — M25561 Pain in right knee: Secondary | ICD-10-CM | POA: Diagnosis not present

## 2022-03-15 DIAGNOSIS — M6281 Muscle weakness (generalized): Secondary | ICD-10-CM

## 2022-03-15 DIAGNOSIS — M79605 Pain in left leg: Secondary | ICD-10-CM

## 2022-03-15 DIAGNOSIS — R262 Difficulty in walking, not elsewhere classified: Secondary | ICD-10-CM

## 2022-03-15 DIAGNOSIS — M48062 Spinal stenosis, lumbar region with neurogenic claudication: Secondary | ICD-10-CM | POA: Diagnosis not present

## 2022-03-15 DIAGNOSIS — M545 Low back pain, unspecified: Secondary | ICD-10-CM

## 2022-03-15 DIAGNOSIS — R29898 Other symptoms and signs involving the musculoskeletal system: Secondary | ICD-10-CM | POA: Diagnosis not present

## 2022-03-15 DIAGNOSIS — G8929 Other chronic pain: Secondary | ICD-10-CM | POA: Diagnosis not present

## 2022-03-15 DIAGNOSIS — M5442 Lumbago with sciatica, left side: Secondary | ICD-10-CM | POA: Diagnosis not present

## 2022-03-15 DIAGNOSIS — M5116 Intervertebral disc disorders with radiculopathy, lumbar region: Secondary | ICD-10-CM | POA: Diagnosis not present

## 2022-03-15 NOTE — Therapy (Signed)
OUTPATIENT PHYSICAL THERAPY THORACOLUMBAR EVALUATION   Patient Name: Marie Mills MRN: [id number] DOB:07-01-1970, 51 y.o., female Today's Date: 03/15/2022  END OF SESSION:  PT End of Session - 03/15/22 1049     Visit Number 2    Number of Visits 8    Date for PT Re-Evaluation 04/05/22    Authorization Type [place] [employer]- 7 visits approved    Authorization Time Period 7 visits approved from 11/21-->05/06/21    Authorization - Visit Number 2    Authorization - Number of Visits 7    Progress Note Due on Visit 7    PT Start Time 1038    PT Stop Time 1116    PT Time Calculation (min) 38 min    Activity Tolerance Patient tolerated treatment well    Behavior During Therapy [facility] for tasks assessed/performed              Past Medical History:  Diagnosis Date   BV (bacterial vaginosis) 08/29/2013   Contraceptive management 03/21/2013   GERD (gastroesophageal reflux disease)    Hypertension    Obesity    Vaginal irritation 08/29/2013   Past Surgical History:  Procedure Laterality Date   CHOLECYSTECTOMY     COLONOSCOPY WITH PROPOFOL N/A 07/30/2021   Procedure: COLONOSCOPY WITH PROPOFOL;  Surgeon: Eloise Harman, DO;  Location: [facility];  Service: Endoscopy;  Laterality: N/A;  2:45pm   POLYPECTOMY  07/30/2021   Procedure: POLYPECTOMY;  Surgeon: Eloise Harman, DO;  Location: [facility];  Service: Endoscopy;;   Patient Active Problem List   Diagnosis Date Noted   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) 02/15/2022   Lumbar radiculopathy 02/15/2022   Spinal stenosis of lumbar region with neurogenic claudication 02/15/2022   Chronic pain syndrome 02/15/2022   Acute bilateral low back pain with bilateral sciatica 12/30/2021   Skin excoriation 12/14/2021   Superficial fungus infection of skin 12/06/2021   Vaginal yeast infection 12/06/2021   Vaginal itching 12/06/2021   Vaginal burning 12/06/2021   Visit for suture removal 12/02/2021   Burning with  urination 11/16/2021   Pain and swelling of right knee 10/29/2021   Encounter for screening colonoscopy 06/29/2021   Screening examination for STD (sexually transmitted disease) 05/06/2021   Epidermal cyst 05/06/2021   Screening mammogram for breast cancer 02/03/2021   Routine general medical examination at a health care facility 02/03/2021   Encounter for gynecological examination with Papanicolaou smear of cervix 02/03/2021   Encounter for surveillance of contraceptive pills 02/03/2021   Anxiety and depression 02/03/2021   Weight gain 07/04/2019   Body mass index 39.0-39.9, adult 05/28/2019   Body mass index 37.0-37.9, adult 04/08/2019   Body mass index 38.0-38.9, adult 02/04/2019   Body mass index 40.0-44.9, adult ([place]) 11/01/2018   Encounter for well woman exam with routine gynecological exam 10/08/2018   Weight loss counseling, encounter for 10/08/2018   Body mass index 45.0-49.9, adult ([place]) 10/08/2018   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 present 05/30/2016   Surveillance for birth control, oral contraceptives 05/30/2016   Family planning 05/30/2016   Screening for colorectal cancer 05/30/2016   Vaginal irritation 08/29/2013   BV (bacterial vaginosis) 08/29/2013   Essential hypertension 03/21/2013   Contraceptive management 03/21/2013    PCP: Neale Burly, MD  REFERRING PROVIDER: Gillis Santa, MD Next apt scheduled after therapy to receive shots   REFERRING DIAG: M54.16,G89.29 (ICD-10-CM) - Chronic radicular lumbar pain M51.16 (ICD-10-CM) - Lumbar disc herniation with radiculopathy M48.062 (  ICD-10-CM) - Spinal stenosis of lumbar region with neurogenic claudication M54.16 (ICD-10-CM) - Lumbar radiculopathy  Rationale for Evaluation and Treatment: Rehabilitation  THERAPY DIAG:  Low back pain, unspecified back pain laterality, unspecified chronicity, unspecified whether sciatica present  Difficulty in walking, not elsewhere classified  Other symptoms and signs  involving the musculoskeletal system  Acute pain of right knee  Pain in left leg  Muscle weakness (generalized)  Left-sided low back pain with left-sided sciatica, unspecified chronicity  ONSET DATE: 6 months  SUBJECTIVE:                                                                                                                                                                                           SUBJECTIVE STATEMENT: Back and both knees hurt.  Had started to come to therapy for her knee but her back was hurting so badly could not do it; want her to try therapy first before trying injections in her back; also treated for her knee. Right knee pain continues  Has began the HEP with minimal changes.  Continues to have LBP with radicular symptoms anterior/lateral from back to Lt to bottom of foot and and just past Rt knee.  Reports some relief sitting but pain in constant.  MRI scheduled for Rt knee 03/25/22.     PERTINENT HISTORY:  CNA  PAIN:  Are you having pain? Yes: NPRS scale: 4/10 Pain location: low back and into left leg Pain description: aggravating, sore, burning, throbbing Aggravating factors: walking, standing Relieving factors: sitting  PRECAUTIONS: None  WEIGHT BEARING RESTRICTIONS: No  FALLS:  Has patient fallen in last 6 months? 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 No intravenous contrast was administered.   COMPARISON:  12/17/2019   FINDINGS: Segmentation:  Standard.   Alignment:  Physiologic.    Vertebrae:  No fracture, evidence of discitis, or bone lesion.   Conus medullaris and cauda equina: Conus extends to the L1 level. Conus and cauda equina appear normal.   Paraspinal and other soft tissues: Negative   Disc levels:   T11-12 and T12-L1 are normal.   L1-L2: Normal disc space and facet joints. No spinal canal stenosis. No neural foraminal stenosis.  L2-L3: Small left asymmetric disc bulge. No spinal canal stenosis. No neural foraminal stenosis.   L3-L4: Large left subarticular disc extrusion with inferior migration to the infrapedicle level with marked mass effect on the left-sided nerve roots in the lateral recess. Severe spinal canal stenosis. No neural foraminal stenosis.   L4-L5: Small central disc protrusion. Mild spinal canal stenosis. Mild left neural foraminal stenosis.   L5-S1: Normal disc space and facet joints. No spinal canal stenosis. No neural foraminal stenosis.   Visualized sacrum: Normal.   IMPRESSION: 1. Large left subarticular disc extrusion at L3-L4 with inferior migration to the infrapedicle level with marked mass effect on the left-sided nerve roots in the lateral recess and severe spinal canal stenosis. 2. Mild spinal canal and left neural foraminal stenosis at L4-L5.     Electronically Signed   By: Ulyses Jarred M.D.   On: 02/05/2022 13:09  PATIENT SURVEYS:  FOTO 22   COGNITION: Overall cognitive status: Within functional limits for tasks assessed     SENSATION: Both legs; left leg stays numb; right side occasionally goes numb  POSTURE: rounded shoulders, forward head, and decreased lumbar lordosis  PALPATION: Tender L3-S1 area  LUMBAR ROM:   AROM eval  Flexion 70% *available  Extension 40% available*  Right lateral flexion   Left lateral flexion   Right rotation   Left rotation    (Blank rows = not tested)    LOWER EXTREMITY MMT:    MMT Right eval Left eval  Hip flexion 4+ 3+*  Hip extension 4 3-  Hip  abduction    Hip adduction    Hip internal rotation    Hip external rotation    Knee flexion 4+ 4-  Knee extension 4+ 4*  Ankle dorsiflexion 5 4*  Ankle plantarflexion    Ankle inversion    Ankle eversion     (Blank rows = not tested)    FUNCTIONAL TESTS:  5 times sit to stand: 19.75 sec  GAIT: Distance walked: 80 ft Assistive device utilized: None Level of assistance: SBA Comments: decreased stance phase left leg; antalgic gait and decreased gait speed  TODAY'S TREATMENT:                                                                                                                              DATE:  03/15/22: Reviewed goals  Prone: POE x 2 min Prone partial press up 5x 10" with reports of decreased radicular symptoms Heel squeeze 5x 5"  Supine:  Deep breathing x 3', abdominal set paired with exhalation. March 5x 3" holds with ab set increased pain Rt knee AROM: 0-133 degrees Bridge 10x 5"   Physical therapy evaluation and HEP instruction    Education details: Patient educated on exam findings, POC, scope of PT, HEP. Person educated: Patient Education method: Explanation, Demonstration, and Handouts Education comprehension: verbalized understanding, returned demonstration, verbal cues required, and tactile cues required  HOME EXERCISE PROGRAM: Access Code:  [facility] URL: [email or web address] 03/15/22: POE, prone press up, abdominal sets, bridge  Date: 03/08/2022 Prepared by: [facility]  Exercises - Lying Prone  - 1 x daily - 7 x weekly - 3 sets - 10 reps  ASSESSMENT:  CLINICAL IMPRESSION: Reviewed goals and educated importance of HEP compliance for maximal benefits, pt able to recall and demonstrate appropriate mechanics.  Attempted extension preferred exercises this session with positive reports of decreased LE symptoms following prone partial press ups, reports of LBP reduced as well.  Educated importance of breathing through session,  added breathing paired with abdominal sets.  Reports of increased burning during supine marching.  Added extension based exercises to HEP with printout given.   OBJECTIVE IMPAIRMENTS: Abnormal gait, decreased activity tolerance, decreased balance, decreased coordination, decreased endurance, decreased knowledge of condition, decreased mobility, difficulty walking, decreased ROM, decreased strength, hypomobility, increased fascial restrictions, impaired perceived functional ability, increased muscle spasms, impaired flexibility, impaired sensation, impaired tone, and pain.   ACTIVITY LIMITATIONS: carrying, lifting, bending, sitting, standing, squatting, sleeping, stairs, transfers, bed mobility, bathing, toileting, dressing, reach over head, hygiene/grooming, locomotion level, and caring for others  PARTICIPATION LIMITATIONS: meal prep, cleaning, laundry, driving, shopping, community activity, occupation, and yard work  [employer] POTENTIAL: Good  CLINICAL DECISION MAKING: Stable/uncomplicated  EVALUATION COMPLEXITY: Low   GOALS: Goals reviewed with patient? No  SHORT TERM GOALS: Target date: 03/22/2022  Patient will be independent with initial HEP  Baseline: Goal status: IN PROGRESS  2.  Patient will improve 5 times sit to stand score from 19.75 sec to 17 sec to demonstrate improved functional mobility and increased lower extremity strength.  Baseline:  Goal status: IN PROGRESS  LONG TERM GOALS: Target date: 04/05/2022  Patient will be independent in self management strategies to improve quality of life and functional outcomes.  Baseline:  Goal status: IN PROGRESS  2.   Patient will report at least 50% improvement in overall symptoms and/or function to demonstrate improved functional mobility  Baseline:  Goal status: IN PROGRESS  3.  Patient will improve 5 times sit to stand score from 19.75 sec to 15 sec to demonstrate improved functional mobility and increased lower extremity  strength.  Baseline:  Goal status: IN PROGRESS  4.  Patient will increase lower extremity MMTs to 4+-5/5 without pain to promote return to ambulation community distances with minimal deviation.  Baseline:  Goal status: IN PROGRESS  PLAN:  PT FREQUENCY: 2x/week  PT DURATION: 4 weeks  Therapeutic exercises, Therapeutic activity, Neuromuscular re-education, Balance training, Gait training, Patient/Family education, Joint manipulation, Joint mobilization, Stair training, Orthotic/Fit training, DME instructions, Aquatic Therapy, Dry Needling, Electrical stimulation, Spinal manipulation, Spinal mobilization, Cryotherapy, Moist heat, Compression bandaging, scar mobilization, Splintting, Taping, Traction, Ultrasound, Ionotophoresis '4mg'$ /ml Dexamethasone, and Manual therapy   PLAN FOR NEXT SESSION: Progress lumbar mobility and core/knee strengthening.  F/u with extension based exercises and HEP compliance.  Ihor Austin, LPTA/CLT; CBIS [phone]  12:48 PM, 03/15/22

## 2022-03-18 ENCOUNTER — Ambulatory Visit (HOSPITAL_COMMUNITY): Payer: Medicaid Other | Attending: Student in an Organized Health Care Education/Training Program

## 2022-03-18 DIAGNOSIS — M25561 Pain in right knee: Secondary | ICD-10-CM | POA: Diagnosis not present

## 2022-03-18 DIAGNOSIS — R29898 Other symptoms and signs involving the musculoskeletal system: Secondary | ICD-10-CM | POA: Insufficient documentation

## 2022-03-18 DIAGNOSIS — M6281 Muscle weakness (generalized): Secondary | ICD-10-CM | POA: Insufficient documentation

## 2022-03-18 DIAGNOSIS — M5442 Lumbago with sciatica, left side: Secondary | ICD-10-CM | POA: Insufficient documentation

## 2022-03-18 DIAGNOSIS — M545 Low back pain, unspecified: Secondary | ICD-10-CM | POA: Diagnosis not present

## 2022-03-18 DIAGNOSIS — M79605 Pain in left leg: Secondary | ICD-10-CM | POA: Insufficient documentation

## 2022-03-18 DIAGNOSIS — R262 Difficulty in walking, not elsewhere classified: Secondary | ICD-10-CM | POA: Diagnosis not present

## 2022-03-18 NOTE — Therapy (Signed)
OUTPATIENT PHYSICAL THERAPY THORACOLUMBAR EVALUATION   Patient Name: Marie Mills MRN: [id number] DOB:Feb 17, 1971, 51 y.o., female Today's Date: 03/18/2022  END OF SESSION:  PT End of Session - 03/18/22 1444     Visit Number 3    Number of Visits 8    Date for PT Re-Evaluation 04/05/22    Authorization Type [place] Medicaid [employer]- 7 visits approved    Authorization Time Period 7 visits approved from 11/21-->05/06/21    Authorization - Number of Visits 7    Progress Note Due on Visit 7    PT Start Time 0241    PT Stop Time 0320    PT Time Calculation (min) 39 min    Activity Tolerance Patient tolerated treatment well    Behavior During Therapy [facility] for tasks assessed/performed              Past Medical History:  Diagnosis Date   BV (bacterial vaginosis) 08/29/2013   Contraceptive management 03/21/2013   GERD (gastroesophageal reflux disease)    Hypertension    Obesity    Vaginal irritation 08/29/2013   Past Surgical History:  Procedure Laterality Date   CHOLECYSTECTOMY     COLONOSCOPY WITH PROPOFOL N/A 07/30/2021   Procedure: COLONOSCOPY WITH PROPOFOL;  Surgeon: Eloise Harman, DO;  [facility]: [facility];  Service: Endoscopy;  Laterality: N/A;  2:45pm   POLYPECTOMY  07/30/2021   Procedure: POLYPECTOMY;  Surgeon: Eloise Harman, DO;  Location: [facility];  Service: Endoscopy;;   Patient Active Problem List   Diagnosis Date Noted   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) 02/15/2022   Lumbar radiculopathy 02/15/2022   Spinal stenosis of lumbar region with neurogenic claudication 02/15/2022   Chronic pain syndrome 02/15/2022   Acute bilateral low back pain with bilateral sciatica 12/30/2021   Skin excoriation 12/14/2021   Superficial fungus infection of skin 12/06/2021   Vaginal yeast infection 12/06/2021   Vaginal itching 12/06/2021   Vaginal burning 12/06/2021   Visit for suture removal 12/02/2021   Burning with urination 11/16/2021   Pain and  swelling of right knee 10/29/2021   Encounter for screening colonoscopy 06/29/2021   Screening examination for STD (sexually transmitted disease) 05/06/2021   Epidermal cyst 05/06/2021   Screening mammogram for breast cancer 02/03/2021   Routine general medical examination at a health care facility 02/03/2021   Encounter for gynecological examination with Papanicolaou smear of cervix 02/03/2021   Encounter for surveillance of contraceptive pills 02/03/2021   Anxiety and depression 02/03/2021   Weight gain 07/04/2019   Body mass index 39.0-39.9, adult 05/28/2019   Body mass index 37.0-37.9, adult 04/08/2019   Body mass index 38.0-38.9, adult 02/04/2019   Body mass index 40.0-44.9, adult ([place]) 11/01/2018   Encounter for well woman exam with routine gynecological exam 10/08/2018   Weight loss counseling, encounter for 10/08/2018   Body mass index 45.0-49.9, adult ([place]) 10/08/2018   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 present 05/30/2016   Surveillance for birth control, oral contraceptives 05/30/2016   Family planning 05/30/2016   Screening for colorectal cancer 05/30/2016   Vaginal irritation 08/29/2013   BV (bacterial vaginosis) 08/29/2013   Essential hypertension 03/21/2013   Contraceptive management 03/21/2013    PCP: Neale Burly, MD  REFERRING PROVIDER: Gillis Santa, MD Next apt scheduled after therapy to receive shots   REFERRING DIAG: M54.16,G89.29 (ICD-10-CM) - Chronic radicular lumbar pain M51.16 (ICD-10-CM) - Lumbar disc herniation with radiculopathy M48.062 (ICD-10-CM) - Spinal stenosis of lumbar region with  neurogenic claudication M54.16 (ICD-10-CM) - Lumbar radiculopathy  Rationale for Evaluation and Treatment: Rehabilitation  THERAPY DIAG:  Pain in left leg  Low back pain, unspecified back pain laterality, unspecified chronicity, unspecified whether sciatica present  Difficulty in walking, not elsewhere classified  Acute pain of right knee  Other  symptoms and signs involving the musculoskeletal system  Muscle weakness (generalized)  Left-sided low back pain with left-sided sciatica, unspecified chronicity  ONSET DATE: 6 months  SUBJECTIVE:                                                                                                                                                                                           SUBJECTIVE STATEMENT: Back and both knees hurt.  Had started to come to therapy for her knee but her back was hurting so badly could not do it; want her to try therapy first before trying injections in her back; also treated for her knee. Right knee pain continues  Has began the HEP with minimal changes.  Continues to have LBP with radicular symptoms anterior/lateral from back to Lt to bottom of foot and and just past Rt knee.  Reports some relief sitting but pain in constant.  MRI scheduled for Rt knee 03/25/22.     PERTINENT HISTORY:  CNA  PAIN:  Are you having pain? Yes: NPRS scale: 4/10 Pain location: low back and into left leg Pain description: aggravating, sore, burning, throbbing Aggravating factors: walking, standing Relieving factors: sitting  PRECAUTIONS: None  WEIGHT BEARING RESTRICTIONS: No  FALLS:  Has patient fallen in last 6 months? 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 No intravenous contrast was administered.   COMPARISON:  12/17/2019   FINDINGS: Segmentation:  Standard.   Alignment:  Physiologic.   Vertebrae:  No fracture,  evidence of discitis, or bone lesion.   Conus medullaris and cauda equina: Conus extends to the L1 level. Conus and cauda equina appear normal.   Paraspinal and other soft tissues: Negative   Disc levels:   T11-12 and T12-L1 are normal.   L1-L2: Normal disc space and facet joints. No spinal canal stenosis. No neural foraminal stenosis.   L2-L3: Small left asymmetric disc bulge. No  spinal canal stenosis. No neural foraminal stenosis.   L3-L4: Large left subarticular disc extrusion with inferior migration to the infrapedicle level with marked mass effect on the left-sided nerve roots in the lateral recess. Severe spinal canal stenosis. No neural foraminal stenosis.   L4-L5: Small central disc protrusion. Mild spinal canal stenosis. Mild left neural foraminal stenosis.   L5-S1: Normal disc space and facet joints. No spinal canal stenosis. No neural foraminal stenosis.   Visualized sacrum: Normal.   IMPRESSION: 1. Large left subarticular disc extrusion at L3-L4 with inferior migration to the infrapedicle level with marked mass effect on the left-sided nerve roots in the lateral recess and severe spinal canal stenosis. 2. Mild spinal canal and left neural foraminal stenosis at L4-L5.     Electronically Signed   By: Ulyses Jarred M.D.   On: 02/05/2022 13:09  PATIENT SURVEYS:  FOTO 22   COGNITION: Overall cognitive status: Within functional limits for tasks assessed     SENSATION: Both legs; left leg stays numb; right side occasionally goes numb  POSTURE: rounded shoulders, forward head, and decreased lumbar lordosis  PALPATION: Tender L3-S1 area  LUMBAR ROM:   AROM eval  Flexion 70% *available  Extension 40% available*  Right lateral flexion   Left lateral flexion   Right rotation   Left rotation    (Blank rows = not tested)    LOWER EXTREMITY MMT:    MMT Right eval Left eval  Hip flexion 4+ 3+*  Hip extension 4 3-  Hip abduction    Hip  adduction    Hip internal rotation    Hip external rotation    Knee flexion 4+ 4-  Knee extension 4+ 4*  Ankle dorsiflexion 5 4*  Ankle plantarflexion    Ankle inversion    Ankle eversion     (Blank rows = not tested)    FUNCTIONAL TESTS:  5 times sit to stand: 19.75 sec  GAIT: Distance walked: 80 ft Assistive device utilized: None Level of assistance: SBA Comments: decreased stance phase left leg; antalgic gait and decreased gait speed  TODAY'S TREATMENT:                                                                                                                              DATE:  03/18/22 Prone: Lying prone x 2' Prone press ups x 8 Glute sets 3" hold x 10  Supine: Transverse abd contraction 5" hold x 10 Bridge with 3" x 10 Bridge with ball squeeze for hip adduction x 10 Bridge with belt for hip abduction x 10 SAQ's x 10 each  Standing: Slant board 5 x 10" (started to cramp so d/c) Scapular retractions 2 x 10   03/15/22: Reviewed goals  Prone: POE x 2 min Prone partial press up 5x 10" with reports of decreased radicular symptoms Heel squeeze 5x 5"  Supine:  Deep breathing x 3', abdominal set paired with exhalation. March 5x 3" holds  with ab set increased pain Rt knee AROM: 0-133 degrees Bridge 10x 5"   Physical therapy evaluation and HEP instruction    Education details: Patient educated on exam findings, POC, scope of PT, HEP. Person educated: Patient Education method: Explanation, Demonstration, and Handouts Education comprehension: verbalized understanding, returned demonstration, verbal cues required, and tactile cues required  HOME EXERCISE PROGRAM: 03/18/22 prone glute sets, hamstring curls Access Code: [facility] URL: [email or web address] 03/15/22: POE, prone press up, abdominal sets, bridge  Date: 03/08/2022 Prepared by: [facility] - Rehab  Exercises - Lying Prone  - 1 x daily - 7 x weekly - 3 sets - 10  reps  ASSESSMENT:  CLINICAL IMPRESSION: Today's session continued to focus on decreasing back and leg pain.  Progress prone exercise today without issue; no pain in back or legs with prone positioning. Patient needs cues for proper breathing with exercise.  Progressed bridge exercise, added SAQ's and updated HEP. Progressed to standing exercise. Patient will benefit from continued skilled therapy services to address deficits and promote return to optimal function.     OBJECTIVE IMPAIRMENTS: Abnormal gait, decreased activity tolerance, decreased balance, decreased coordination, decreased endurance, decreased knowledge of condition, decreased mobility, difficulty walking, decreased ROM, decreased strength, hypomobility, increased fascial restrictions, impaired perceived functional ability, increased muscle spasms, impaired flexibility, impaired sensation, impaired tone, and pain.   ACTIVITY LIMITATIONS: carrying, lifting, bending, sitting, standing, squatting, sleeping, stairs, transfers, bed mobility, bathing, toileting, dressing, reach over head, hygiene/grooming, locomotion level, and caring for others  PARTICIPATION LIMITATIONS: meal prep, cleaning, laundry, driving, shopping, community activity, occupation, and yard work  [employer] POTENTIAL: Good  CLINICAL DECISION MAKING: Stable/uncomplicated  EVALUATION COMPLEXITY: Low   GOALS: Goals reviewed with patient? No  SHORT TERM GOALS: Target date: 03/22/2022  Patient will be independent with initial HEP  Baseline: Goal status: IN PROGRESS  2.  Patient will improve 5 times sit to stand score from 19.75 sec to 17 sec to demonstrate improved functional mobility and increased lower extremity strength.  Baseline:  Goal status: IN PROGRESS  LONG TERM GOALS: Target date: 04/05/2022  Patient will be independent in self management strategies to improve quality of life and functional outcomes.  Baseline:  Goal status: IN PROGRESS  2.    Patient will report at least 50% improvement in overall symptoms and/or function to demonstrate improved functional mobility  Baseline:  Goal status: IN PROGRESS  3.  Patient will improve 5 times sit to stand score from 19.75 sec to 15 sec to demonstrate improved functional mobility and increased lower extremity strength.  Baseline:  Goal status: IN PROGRESS  4.  Patient will increase lower extremity MMTs to 4+-5/5 without pain to promote return to ambulation community distances with minimal deviation.  Baseline:  Goal status: IN PROGRESS  PLAN:  PT FREQUENCY: 2x/week  PT DURATION: 4 weeks  Therapeutic exercises, Therapeutic activity, Neuromuscular re-education, Balance training, Gait training, Patient/Family education, Joint manipulation, Joint mobilization, Stair training, Orthotic/Fit training, DME instructions, Aquatic Therapy, Dry Needling, Electrical stimulation, Spinal manipulation, Spinal mobilization, Cryotherapy, Moist heat, Compression bandaging, scar mobilization, Splintting, Taping, Traction, Ultrasound, Ionotophoresis '4mg'$ /ml Dexamethasone, and Manual therapy   PLAN FOR NEXT SESSION: Progress lumbar mobility and core/knee strengthening.  F/u with extension based exercises and HEP compliance. Add seated  or long sitting calf stretch  3:17 PM, 03/18/22 Mariame Rybolt Small Maggie Senseney MPT [facility] [place] [phone]

## 2022-03-23 ENCOUNTER — Other Ambulatory Visit: Payer: Self-pay | Admitting: Adult Health

## 2022-03-23 ENCOUNTER — Encounter (HOSPITAL_COMMUNITY): Payer: Medicaid Other

## 2022-03-23 ENCOUNTER — Encounter: Payer: Self-pay | Admitting: Adult Health

## 2022-03-23 ENCOUNTER — Ambulatory Visit (INDEPENDENT_AMBULATORY_CARE_PROVIDER_SITE_OTHER): Payer: Medicaid Other | Admitting: Adult Health

## 2022-03-23 VITALS — BP 158/90 | HR 88 | Ht 66.0 in | Wt 273.6 lb

## 2022-03-23 DIAGNOSIS — Z0000 Encounter for general adult medical examination without abnormal findings: Secondary | ICD-10-CM | POA: Diagnosis not present

## 2022-03-23 DIAGNOSIS — Z1211 Encounter for screening for malignant neoplasm of colon: Secondary | ICD-10-CM

## 2022-03-23 DIAGNOSIS — Z01419 Encounter for gynecological examination (general) (routine) without abnormal findings: Secondary | ICD-10-CM

## 2022-03-23 DIAGNOSIS — I10 Essential (primary) hypertension: Secondary | ICD-10-CM

## 2022-03-23 DIAGNOSIS — Z3041 Encounter for surveillance of contraceptive pills: Secondary | ICD-10-CM

## 2022-03-23 LAB — HEMOCCULT GUIAC POC 1CARD (OFFICE): Fecal Occult Blood, POC: NEGATIVE

## 2022-03-23 MED ORDER — NORETHINDRONE 0.35 MG PO TABS: 1.0000 | ORAL_TABLET | Freq: Every day | ORAL | 11 refills | Status: DC

## 2022-03-23 NOTE — Progress Notes (Addendum)
Patient ID: Marie Mills, female   DOB: 1970/06/25, 51 y.o.   MRN: [id number] History of Present Illness: Marie Mills is a 51 year old white female, divorced, [id number], in for a well woman gyn exam. She has been out of work since September with back and knee problems. Has had prednisone and going to PT, hopes to try injections, does not want surgery.   Last pap was negative HPV and malignancy 02/03/21.  PCP is Dr Sherrie Sport   Current Medications, Allergies, Past Medical History, Past Surgical History, Family History and Social History were reviewed in [place] record.     Review of Systems: Patient denies any headaches, hearing loss, fatigue, blurred vision, shortness of breath, chest pain, abdominal pain, problems with bowel movements, urination, or intercourse. No joint pain or mood swings. +stress from not working, has no DB  Has back pain and pain right knee  Has numbness leg leg and swelling    Physical Exam:BP (!) 158/90 (BP Location: Left Wrist, Patient Position: Sitting, Cuff Size: Normal)   Pulse 88   Ht '5\' 6"'$  (1.676 m)   Wt 273 lb 9.6 oz (124.1 kg)   LMP 02/21/2022   BMI 44.16 kg/m   General:  Well developed, well nourished, no acute distress Skin:  Warm and dry Neck:  Midline trachea, normal thyroid, good ROM, no lymphadenopathy Lungs; Clear to auscultation bilaterally Breast:  No dominant palpable mass, retraction, or nipple discharge Cardiovascular: Regular rate and rhythm Abdomen:  Soft, non tender, no hepatosplenomegaly Pelvic:  External genitalia is normal in appearance, no lesions.  The vagina is normal in appearance. Urethra has no lesions or masses. The cervix is bulbous.  Uterus is felt to be normal size, shape, and contour.  No adnexal masses or tenderness noted.Bladder is non tender, no masses felt. Rectal: Good sphincter tone, no polyps, or hemorrhoids felt.  Hemoccult negative. Extremities/musculoskeletal:  No swelling or varicosities noted, no  clubbing or cyanosis Psych:  No mood changes, alert and cooperative,she is worried  AA is 0 Fall risk is low She refused PHQ 9 and GAD 7  [place] - 03/23/22 1411       Pregnancy Intention Screening   Does the patient want to become pregnant in the next year? No    Does the patient's partner want to become pregnant in the next year? No    Would the patient like to discuss contraceptive options today? No      Contraception Wrap Up   Current Method Oral Contraceptive    End Method Oral Contraceptive    Contraception Counseling Provided No             Examination chaperoned by Celene Squibb LPN   Impression and Plan: 1. Encounter for well woman exam with routine gynecological exam Pap in 2025 Physical in 1 year Had negative mammogram 11/89/22 Colonoscopy per GI. Last one was 07/30/21 Keep appt with Dr Aline Brochure about knee and back doctor Check CBC,CMP,TSH and lipids   2. Encounter for screening fecal occult blood testing Hemoccult was negative  - POCT occult blood stool  3. Essential hypertension Take BP meds from PCP Keep check on BP Will change to POP birth control Will follow up in 2 months for BP check and ROS  4. Encounter for surveillance of contraceptive pills Will  finish this week of pills and then start Micronor, use condoms for 1 pack and take same time every day will recheck in 2 months  Meds ordered this encounter  Medications   norethindrone (MICRONOR) 0.35 MG tablet    Sig: Take 1 tablet (0.35 mg total) by mouth daily.    Dispense:  28 tablet    Refill:  11    Order Specific Question:   Supervising Provider    Answer:   Tania Ade H [[id number]]

## 2022-03-23 NOTE — Progress Notes (Signed)
Check CBC,CMP,TSH and lipids  

## 2022-03-25 ENCOUNTER — Ambulatory Visit (HOSPITAL_COMMUNITY)
Admission: RE | Admit: 2022-03-25 | Discharge: 2022-03-25 | Disposition: A | Discharge status: Home / Self Care | Payer: Medicaid Other | Source: Ambulatory Visit | Attending: Orthopedic Surgery | Admitting: Orthopedic Surgery

## 2022-03-25 ENCOUNTER — Encounter (HOSPITAL_COMMUNITY): Payer: Medicaid Other

## 2022-03-25 DIAGNOSIS — M25561 Pain in right knee: Secondary | ICD-10-CM | POA: Insufficient documentation

## 2022-03-25 DIAGNOSIS — G8929 Other chronic pain: Secondary | ICD-10-CM | POA: Insufficient documentation

## 2022-03-25 DIAGNOSIS — Z01419 Encounter for gynecological examination (general) (routine) without abnormal findings: Secondary | ICD-10-CM | POA: Diagnosis not present

## 2022-03-26 LAB — COMPREHENSIVE METABOLIC PANEL
ALT: 17 IU/L (ref 0–32)
AST: 22 IU/L (ref 0–40)
Albumin/Globulin Ratio: 2 (ref 1.2–2.2)
Albumin: 4.3 g/dL (ref 3.9–4.9)
Alkaline Phosphatase: 69 IU/L (ref 44–121)
BUN/Creatinine Ratio: 22 (ref 9–23)
BUN: 16 mg/dL (ref 6–24)
Bilirubin Total: 0.3 mg/dL (ref 0.0–1.2)
CO2: 21 mmol/L (ref 20–29)
Calcium: 9.5 mg/dL (ref 8.7–10.2)
Chloride: 106 mmol/L (ref 96–106)
Creatinine, Ser: 0.72 mg/dL (ref 0.57–1.00)
Globulin, Total: 2.2 g/dL (ref 1.5–4.5)
Glucose: 102 mg/dL — ABNORMAL HIGH (ref 70–99)
Potassium: 4.8 mmol/L (ref 3.5–5.2)
Sodium: 146 mmol/L — ABNORMAL HIGH (ref 134–144)
Total Protein: 6.5 g/dL (ref 6.0–8.5)
eGFR: 102 mL/min/{1.73_m2} (ref >59)

## 2022-03-26 LAB — LIPID PANEL
Chol/HDL Ratio: 3.6 ratio (ref 0.0–4.4)
Cholesterol, Total: 172 mg/dL (ref 100–199)
HDL: 48 mg/dL (ref >39)
LDL Chol Calc (NIH): 100 mg/dL — ABNORMAL HIGH (ref 0–99)
Triglycerides: 137 mg/dL (ref 0–149)
VLDL Cholesterol Cal: 24 mg/dL (ref 5–40)

## 2022-03-26 LAB — CBC
Hematocrit: 39.3 % (ref 34.0–46.6)
Hemoglobin: 13 g/dL (ref 11.1–15.9)
MCH: 28.6 pg (ref 26.6–33.0)
MCHC: 33.1 g/dL (ref 31.5–35.7)
MCV: 87 fL (ref 79–97)
Platelets: 294 10*3/uL (ref 150–450)
RBC: 4.54 x10E6/uL (ref 3.77–5.28)
RDW: 13.4 % (ref 11.7–15.4)
WBC: 5.7 10*3/uL (ref 3.4–10.8)

## 2022-03-26 LAB — TSH: TSH: 0.659 u[IU]/mL (ref 0.450–4.500)

## 2022-03-29 ENCOUNTER — Ambulatory Visit (HOSPITAL_COMMUNITY): Payer: Medicaid Other

## 2022-03-29 DIAGNOSIS — M545 Low back pain, unspecified: Secondary | ICD-10-CM

## 2022-03-29 DIAGNOSIS — M6281 Muscle weakness (generalized): Secondary | ICD-10-CM | POA: Diagnosis not present

## 2022-03-29 DIAGNOSIS — R29898 Other symptoms and signs involving the musculoskeletal system: Secondary | ICD-10-CM | POA: Diagnosis not present

## 2022-03-29 DIAGNOSIS — R262 Difficulty in walking, not elsewhere classified: Secondary | ICD-10-CM | POA: Diagnosis not present

## 2022-03-29 DIAGNOSIS — M25561 Pain in right knee: Secondary | ICD-10-CM | POA: Diagnosis not present

## 2022-03-29 DIAGNOSIS — M79605 Pain in left leg: Secondary | ICD-10-CM

## 2022-03-29 DIAGNOSIS — M5442 Lumbago with sciatica, left side: Secondary | ICD-10-CM | POA: Diagnosis not present

## 2022-03-29 NOTE — Therapy (Signed)
OUTPATIENT PHYSICAL THERAPY THORACOLUMBAR EVALUATION   Patient Name: Marie Mills MRN: [id number] DOB:1970-12-28, 51 y.o., female Today's Date: 03/29/2022  END OF SESSION:  PT End of Session - 03/29/22 0910     Visit Number 4    Number of Visits 8    Date for PT Re-Evaluation 04/05/22    Authorization Type [place] [employer]- 7 visits approved    Authorization Time Period 7 visits approved from 11/21-->05/06/21    Authorization - Visit Number 3    Authorization - Number of Visits 7    Progress Note Due on Visit 7    PT Start Time 0905    PT Stop Time 0945    PT Time Calculation (min) 40 min    Activity Tolerance Patient tolerated treatment well    Behavior During Therapy [facility] for tasks assessed/performed              Past Medical History:  Diagnosis Date   BV (bacterial vaginosis) 08/29/2013   Contraceptive management 03/21/2013   GERD (gastroesophageal reflux disease)    Hypertension    Obesity    Vaginal irritation 08/29/2013   Past Surgical History:  Procedure Laterality Date   CHOLECYSTECTOMY     COLONOSCOPY WITH PROPOFOL N/A 07/30/2021   Procedure: COLONOSCOPY WITH PROPOFOL;  Surgeon: Eloise Harman, DO;  [facility];  Service: Endoscopy;  Laterality: N/A;  2:45pm   POLYPECTOMY  07/30/2021   Procedure: POLYPECTOMY;  Surgeon: Eloise Harman, DO;  Location: [facility];  Service: Endoscopy;;   Patient Active Problem List   Diagnosis Date Noted   Encounter for screening fecal occult blood testing 03/23/2022   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) 02/15/2022   Lumbar radiculopathy 02/15/2022   Spinal stenosis of lumbar region with neurogenic claudication 02/15/2022   Chronic pain syndrome 02/15/2022   Acute bilateral low back pain with bilateral sciatica 12/30/2021   Skin excoriation 12/14/2021   Superficial fungus infection of skin 12/06/2021   Vaginal yeast infection 12/06/2021   Vaginal itching 12/06/2021   Vaginal burning  12/06/2021   Visit for suture removal 12/02/2021   Burning with urination 11/16/2021   Pain and swelling of right knee 10/29/2021   Encounter for screening colonoscopy 06/29/2021   Screening examination for STD (sexually transmitted disease) 05/06/2021   Epidermal cyst 05/06/2021   Screening mammogram for breast cancer 02/03/2021   Routine general medical examination at a health care facility 02/03/2021   Encounter for gynecological examination with Papanicolaou smear of cervix 02/03/2021   Encounter for surveillance of contraceptive pills 02/03/2021   Anxiety and depression 02/03/2021   Weight gain 07/04/2019   Body mass index 39.0-39.9, adult 05/28/2019   Body mass index 37.0-37.9, adult 04/08/2019   Body mass index 38.0-38.9, adult 02/04/2019   Body mass index 40.0-44.9, adult ([place]) 11/01/2018   Encounter for well woman exam with routine gynecological exam 10/08/2018   Weight loss counseling, encounter for 10/08/2018   Body mass index 45.0-49.9, adult ([employer]) 10/08/2018   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 present 05/30/2016   Surveillance for birth control, oral contraceptives 05/30/2016   Family planning 05/30/2016   Screening for colorectal cancer 05/30/2016   Vaginal irritation 08/29/2013   BV (bacterial vaginosis) 08/29/2013   Essential hypertension 03/21/2013   Contraceptive management 03/21/2013    PCP: Neale Burly, MD  REFERRING PROVIDER: Gillis Santa, MD Next apt scheduled after therapy to receive shots   REFERRING DIAG: M54.16,G89.29 (ICD-10-CM) - Chronic radicular lumbar  pain M51.16 (ICD-10-CM) - Lumbar disc herniation with radiculopathy M48.062 (ICD-10-CM) - Spinal stenosis of lumbar region with neurogenic claudication M54.16 (ICD-10-CM) - Lumbar radiculopathy  Rationale for Evaluation and Treatment: Rehabilitation  THERAPY DIAG:  Pain in left leg  Low back pain, unspecified back pain laterality, unspecified chronicity, unspecified whether sciatica  present  Difficulty in walking, not elsewhere classified  Acute pain of right knee  Other symptoms and signs involving the musculoskeletal system  Muscle weakness (generalized)  ONSET DATE: 6 months  SUBJECTIVE:                                                                                                                                                                                           SUBJECTIVE STATEMENT: "I have been having a lot of pain, trouble" back and knees; leg pain > back pain; trying to move more at home.  Will have an injection in her back after she completes her therapy. Had MRI of her right knee 03/25/22 and see Aline Brochure tomorrow regarding knee.   Has began the HEP with minimal changes.  Continues to have LBP with radicular symptoms anterior/lateral from back to Lt to bottom of foot and and just past Rt knee.  Reports some relief sitting but pain in constant.  MRI scheduled for Rt knee 03/25/22.     PERTINENT HISTORY:  CNA  PAIN:  Are you having pain? Yes: NPRS scale: 8/10 Pain location: low back and into left leg Pain description: aggravating, sore, burning, throbbing Aggravating factors: walking, standing Relieving factors: sitting  PRECAUTIONS: None  WEIGHT BEARING RESTRICTIONS: No  FALLS:  Has patient fallen in last 6 months? 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 No intravenous contrast was administered.   COMPARISON:  12/17/2019   FINDINGS: Segmentation:  Standard.   Alignment:   Physiologic.   Vertebrae:  No fracture, evidence of discitis, or bone lesion.   Conus medullaris and cauda equina: Conus extends to the L1 level. Conus and cauda equina appear normal.   Paraspinal and other soft tissues: Negative   Disc levels:   T11-12 and T12-L1 are normal.   L1-L2: Normal disc space and facet joints. No spinal canal stenosis. No neural  foraminal stenosis.   L2-L3: Small left asymmetric disc bulge. No spinal canal stenosis. No neural foraminal stenosis.   L3-L4: Large left subarticular disc extrusion with inferior migration to the infrapedicle level with marked mass effect on the left-sided nerve roots in the lateral recess. Severe spinal canal stenosis. No neural foraminal stenosis.   L4-L5: Small central disc protrusion. Mild spinal canal stenosis. Mild left neural foraminal stenosis.   L5-S1: Normal disc space and facet joints. No spinal canal stenosis. No neural foraminal stenosis.   Visualized sacrum: Normal.   IMPRESSION: 1. Large left subarticular disc extrusion at L3-L4 with inferior migration to the infrapedicle level with marked mass effect on the left-sided nerve roots in the lateral recess and severe spinal canal stenosis. 2. Mild spinal canal and left neural foraminal stenosis at L4-L5.     Electronically Signed   By: Ulyses Jarred M.D.   On: 02/05/2022 13:09  PATIENT SURVEYS:  FOTO 22   COGNITION: Overall cognitive status: Within functional limits for tasks assessed     SENSATION: Both legs; left leg stays numb; right side occasionally goes numb  POSTURE: rounded shoulders, forward head, and decreased lumbar lordosis  PALPATION: Tender L3-S1 area  LUMBAR ROM:   AROM eval  Flexion 70% *available  Extension 40% available*  Right lateral flexion   Left lateral flexion   Right rotation   Left rotation    (Blank rows = not tested)    LOWER EXTREMITY MMT:    MMT Right eval Left eval  Hip flexion 4+ 3+*  Hip  extension 4 3-  Hip abduction    Hip adduction    Hip internal rotation    Hip external rotation    Knee flexion 4+ 4-  Knee extension 4+ 4*  Ankle dorsiflexion 5 4*  Ankle plantarflexion    Ankle inversion    Ankle eversion     (Blank rows = not tested)    FUNCTIONAL TESTS:  5 times sit to stand: 19.75 sec  GAIT: Distance walked: 80 ft Assistive device utilized: None Level of assistance: SBA Comments: decreased stance phase left leg; antalgic gait and decreased gait speed  TODAY'S TREATMENT:                                                                                                                              DATE:  03/29/22 Prone: Prone lying x 2' no improvement pain into calves Prone press up x 5 reps increasing pain in legs so d/c  Supine: LTR x 20 SKTC 5 x 20" using towel to assist Hamstring stretch using a towel 5 x 20" Abdominal bracing 5" hold x 15 Abdominal bracing with march 2 x 10 SAQ's 2 x 10 each   03/18/22 Prone: Lying prone x 2' Prone press ups x 8 Glute sets 3" hold x 10  Supine: Transverse abd contraction 5" hold x 10 Bridge with 3" x 10 Bridge with ball squeeze for hip  adduction x 10 Bridge with belt for hip abduction x 10 SAQ's x 10 each  Standing: Slant board 5 x 10" (started to cramp so d/c) Scapular retractions 2 x 10   03/15/22: Reviewed goals  Prone: POE x 2 min Prone partial press up 5x 10" with reports of decreased radicular symptoms Heel squeeze 5x 5"  Supine:  Deep breathing x 3', abdominal set paired with exhalation. March 5x 3" holds with ab set increased pain Rt knee AROM: 0-133 degrees Bridge 10x 5"   Physical therapy evaluation and HEP instruction    Education details: Patient educated on exam findings, POC, scope of PT, HEP. Person educated: Patient Education method: Explanation, Demonstration, and Handouts Education comprehension: verbalized understanding, returned demonstration, verbal cues  required, and tactile cues required  HOME EXERCISE PROGRAM: 03/18/22 prone glute sets, hamstring curls Access Code: [facility] URL: [email or web address] 03/15/22: POE, prone press up, abdominal sets, bridge  Date: 03/08/2022 Prepared by: [facility] - [facility]  Exercises - Lying Prone  - 1 x daily - 7 x weekly - 3 sets - 10 reps  ASSESSMENT:  CLINICAL IMPRESSION: Today's session continued to focus on decreasing back and leg pain.  Patient with increased pain in her legs with prone exercise today so discontinued and focused on lumbar stretching and core and lower extremity strengthening.  Patient quite painful with leg movements today; guarded.  Patient will benefit from continued skilled therapy services to address deficits and promote return to optimal function.     OBJECTIVE IMPAIRMENTS: Abnormal gait, decreased activity tolerance, decreased balance, decreased coordination, decreased endurance, decreased knowledge of condition, decreased mobility, difficulty walking, decreased ROM, decreased strength, hypomobility, increased fascial restrictions, impaired perceived functional ability, increased muscle spasms, impaired flexibility, impaired sensation, impaired tone, and pain.   ACTIVITY LIMITATIONS: carrying, lifting, bending, sitting, standing, squatting, sleeping, stairs, transfers, bed mobility, bathing, toileting, dressing, reach over head, hygiene/grooming, locomotion level, and caring for others  PARTICIPATION LIMITATIONS: meal prep, cleaning, laundry, driving, shopping, community activity, occupation, and yard work  [employer] POTENTIAL: Good  CLINICAL DECISION MAKING: Stable/uncomplicated  EVALUATION COMPLEXITY: Low   GOALS: Goals reviewed with patient? No  SHORT TERM GOALS: Target date: 03/22/2022  Patient will be independent with initial HEP  Baseline: Goal status: IN PROGRESS  2.  Patient will improve 5 times sit to stand score from 19.75 sec to 17 sec to demonstrate  improved functional mobility and increased lower extremity strength.  Baseline:  Goal status: IN PROGRESS  LONG TERM GOALS: Target date: 04/05/2022  Patient will be independent in self management strategies to improve quality of life and functional outcomes.  Baseline:  Goal status: IN PROGRESS  2.   Patient will report at least 50% improvement in overall symptoms and/or function to demonstrate improved functional mobility  Baseline:  Goal status: IN PROGRESS  3.  Patient will improve 5 times sit to stand score from 19.75 sec to 15 sec to demonstrate improved functional mobility and increased lower extremity strength.  Baseline:  Goal status: IN PROGRESS  4.  Patient will increase lower extremity MMTs to 4+-5/5 without pain to promote return to ambulation community distances with minimal deviation.  Baseline:  Goal status: IN PROGRESS  PLAN:  PT FREQUENCY: 2x/week  PT DURATION: 4 weeks  Therapeutic exercises, Therapeutic activity, Neuromuscular re-education, Balance training, Gait training, Patient/Family education, Joint manipulation, Joint mobilization, Stair training, Orthotic/Fit training, DME instructions, Aquatic Therapy, Dry Needling, Electrical stimulation, Spinal manipulation, Spinal mobilization, Cryotherapy, Moist heat, Compression bandaging, scar mobilization, Splintting, Taping, Traction,  Ultrasound, Ionotophoresis '4mg'$ /ml Dexamethasone, and Manual therapy   PLAN FOR NEXT SESSION: Progress lumbar mobility and core/knee strengthening.     9:46 AM, 03/29/22 Karalyn Kadel Small Vinson Tietze MPT [facility] [place] [phone]

## 2022-03-30 ENCOUNTER — Ambulatory Visit: Payer: Medicaid Other | Admitting: Orthopedic Surgery

## 2022-03-30 VITALS — BP 168/99 | HR 94

## 2022-03-30 DIAGNOSIS — M1711 Unilateral primary osteoarthritis, right knee: Secondary | ICD-10-CM | POA: Diagnosis not present

## 2022-03-30 DIAGNOSIS — M23321 Other meniscus derangements, posterior horn of medial meniscus, right knee: Secondary | ICD-10-CM | POA: Diagnosis not present

## 2022-03-30 DIAGNOSIS — M1712 Unilateral primary osteoarthritis, left knee: Secondary | ICD-10-CM

## 2022-03-30 MED ORDER — METHYLPREDNISOLONE ACETATE 40 MG/ML IJ SUSP
40.0000 mg | Freq: Once | INTRAMUSCULAR | Status: AC
  Administered 2022-03-30: 40 mg via INTRA_ARTICULAR

## 2022-03-30 NOTE — Addendum Note (Signed)
Addended by: Moreen Fowler R on: 03/30/2022 09:46 AM   Modules accepted: Orders

## 2022-03-30 NOTE — Patient Instructions (Signed)

## 2022-03-30 NOTE — Progress Notes (Addendum)
FOLLOW UP   Encounter Diagnosis  Name Primary?   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  Evidence of arthritis of the right knee.  She is 51 she has BMI of 44  The patient meets the AMA guidelines for Morbid (severe) obesity with a BMI > 40.0 and I have recommended weight loss.  She is not a candidate for knee replacement surgery and her x-rays do not show major joint space narrowing in retrospect there is some symmetric joint space narrowing but not enough to warrant total knee replacement   Plan return in 1 month for possible repeat injection  PRIOR TREATMENT: 11/17/2021 This is a 51 year old female presents with atraumatic onset of right knee pain.  She denies any trauma.  Complains of diffuse knee pain anteriorly and posteriorly as well.  There is no localization is in terms of medial or lateral JUST diffuse pain all over the joint.  She did receive an anti-inflammatory from primary care she said did not help. IT WAS DICLOFENAC      Marie Mills has been evaluated for the spine issue but today she is complaining of pain in the right knee and numbness over the anterior aspect of the right knee.  She does not report any new trauma   When I saw her in August she was sent for physical therapy for ongoing pain which came on without any trauma.  She had anterior and posterior knee pain which was diffuse.  Her examination revealed medial and lateral joint line tenderness x-rays were normal with normal alignment possible patellofemoral bone spur indicating arthritis she was put on Clinoril 150 mg twice a day   Reexamination she is tender over the medial joint line she has numbness over the front of the knee which I cannot explain she seems to have normal range of motion a pause McMurray's sign and negative drawer test and normal  collateral ligaments   Recommend MRI to evaluate torn medial meniscus  MRI REPORT: Medial: Degeneration and fraying of the free edge of the posterior horn of the medial meniscus. Otherwise no discrete medial meniscal tear.   Lateral: Intact.   LIGAMENTS   Cruciates: ACL and PCL are intact.   Collaterals: Medial collateral ligament is intact. Lateral collateral ligament complex is intact.   CARTILAGE   Patellofemoral: Partial-thickness cartilage loss of the lateral patellar facet with mild subchondral reactive marrow edema.   Medial: Focal high-grade partial-thickness cartilage loss of the weight-bearing surface of the medial femoral condyle and medial tibial plateau measuring 7 x 6 mm.   Lateral:  No chondral defect.   JOINT: No joint effusion. Normal Hoffa's fat-pad. No plical thickening.   POPLITEAL FOSSA: Popliteus tendon is intact. No Baker's cyst.   EXTENSOR MECHANISM: Intact quadriceps tendon. Intact patellar tendon. Intact lateral patellar retinaculum. Intact medial patellar retinaculum. Intact MPFL.   BONES: No aggressive osseous lesion. No fracture or dislocation.   Other: No fluid collection or hematoma. Muscles are normal.   IMPRESSION: 1. Degeneration and fraying of the free edge of the posterior horn of the medial meniscus. Otherwise no discrete medial meniscal tear. 2. Partial-thickness cartilage loss of the lateral patellar facet with mild subchondral reactive marrow edema. 3. Focal high-grade partial-thickness cartilage loss of the weight-bearing surface of the medial femoral condyle and medial  tibial plateau measuring 7 x 6 mm.     Electronically Signed   By: Kathreen Devoid M.D.   On: 03/26/2022 07:47  LUMBAR MRI  L3-L4: Asymmetric disc bulge to the left with mild left lateral recess stenosis. No significant neural foraminal stenosis. Mild facet joint arthropathy.   L4-L5: Disc height loss and disc bulge with narrowing of lateral recesses  bilaterally. Mild bilateral neural foraminal stenosis. Mild-to-moderate bilateral facet joint arthropathy.   L5-S1: Broad-based disc bulge with narrowing of lateral recesses bilaterally. Mild bilateral neural foraminal stenosis. Moderate bilateral facet joint arthropathy.   X-ray report (11/17/21)  Chief complaint pain right knee diffuse nonlocalized acute  Images [facility] both knees lateral and sunrise view right knee  Reading: No fracture or dislocation, alignment normal.  On the patellofemoral joint with small lateral spur.  Impression: Normal alignment right knee possible patellofemoral arthritis   Encounter Diagnoses  Name Primary?   Derangement of posterior horn of medial meniscus of right knee    Primary osteoarthritis of left knee Yes   Procedure note right knee injection   verbal consent was obtained to inject right knee joint  Timeout was completed to confirm the site of injection  The medications used were depomedrol 40 mg and 1% lidocaine 3 cc Anesthesia was provided by ethyl chloride and the skin was prepped with alcohol.  After cleaning the skin with alcohol a 20-gauge needle was used to inject the right knee joint. There were no complications. A sterile bandage was applied.   51 year old female with MRI showing no evidence of meniscal tear.  Evidence of arthritis of the right knee.  She is 51 she has BMI of 44  The patient meets the AMA guidelines for Morbid (severe) obesity with a BMI > 40.0 and I have recommended weight loss.  She is not a candidate for knee replacement surgery and her x-rays do not show major joint space narrowing in retrospect there is some symmetric joint space narrowing but not enough to warrant total knee replacement   Plan return in 1 month for possible repeat injection

## 2022-03-31 ENCOUNTER — Encounter (HOSPITAL_COMMUNITY): Payer: Self-pay

## 2022-03-31 ENCOUNTER — Ambulatory Visit (HOSPITAL_COMMUNITY): Payer: Medicaid Other

## 2022-03-31 DIAGNOSIS — M545 Low back pain, unspecified: Secondary | ICD-10-CM | POA: Diagnosis not present

## 2022-03-31 DIAGNOSIS — M5442 Lumbago with sciatica, left side: Secondary | ICD-10-CM | POA: Diagnosis not present

## 2022-03-31 DIAGNOSIS — R262 Difficulty in walking, not elsewhere classified: Secondary | ICD-10-CM

## 2022-03-31 DIAGNOSIS — M6281 Muscle weakness (generalized): Secondary | ICD-10-CM

## 2022-03-31 DIAGNOSIS — M25561 Pain in right knee: Secondary | ICD-10-CM | POA: Diagnosis not present

## 2022-03-31 DIAGNOSIS — M79605 Pain in left leg: Secondary | ICD-10-CM | POA: Diagnosis not present

## 2022-03-31 DIAGNOSIS — R29898 Other symptoms and signs involving the musculoskeletal system: Secondary | ICD-10-CM | POA: Diagnosis not present

## 2022-03-31 NOTE — Therapy (Signed)
OUTPATIENT PHYSICAL THERAPY THORACOLUMBAR TREATMENT   Patient Name: Marie Mills MRN: [id number] DOB:1971-01-01, 51 y.o., female Today's Date: 03/31/2022  END OF SESSION:  PT End of Session - 03/31/22 1014     Visit Number 5    Number of Visits 8    Date for PT Re-Evaluation 04/05/22    Authorization Type [place] Medicaid [employer]- 7 visits approved    Authorization Time Period 7 visits approved from 11/21-->05/06/21    Authorization - Visit Number 5    Authorization - Number of Visits 7    Progress Note Due on Visit 7    PT Start Time 1000   late arrival   PT Stop Time 1034    PT Time Calculation (min) 34 min    Activity Tolerance Patient tolerated treatment well    Behavior During Therapy [facility] for tasks assessed/performed               Past Medical History:  Diagnosis Date   BV (bacterial vaginosis) 08/29/2013   Contraceptive management 03/21/2013   GERD (gastroesophageal reflux disease)    Hypertension    Obesity    Vaginal irritation 08/29/2013   Past Surgical History:  Procedure Laterality Date   CHOLECYSTECTOMY     COLONOSCOPY WITH PROPOFOL N/A 07/30/2021   Procedure: COLONOSCOPY WITH PROPOFOL;  Surgeon: Eloise Harman, DO;  Location: [facility];  Service: Endoscopy;  Laterality: N/A;  2:45pm   POLYPECTOMY  07/30/2021   Procedure: POLYPECTOMY;  Surgeon: Eloise Harman, DO;  Location: [facility];  Service: Endoscopy;;   Patient Active Problem List   Diagnosis Date Noted   Encounter for screening fecal occult blood testing 03/23/2022   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) 02/15/2022   Lumbar radiculopathy 02/15/2022   Spinal stenosis of lumbar region with neurogenic claudication 02/15/2022   Chronic pain syndrome 02/15/2022   Acute bilateral low back pain with bilateral sciatica 12/30/2021   Skin excoriation 12/14/2021   Superficial fungus infection of skin 12/06/2021   Vaginal yeast infection 12/06/2021   Vaginal itching 12/06/2021    Vaginal burning 12/06/2021   Visit for suture removal 12/02/2021   Burning with urination 11/16/2021   Pain and swelling of right knee 10/29/2021   Encounter for screening colonoscopy 06/29/2021   Screening examination for STD (sexually transmitted disease) 05/06/2021   Epidermal cyst 05/06/2021   Screening mammogram for breast cancer 02/03/2021   Routine general medical examination at a health care facility 02/03/2021   Encounter for gynecological examination with Papanicolaou smear of cervix 02/03/2021   Encounter for surveillance of contraceptive pills 02/03/2021   Anxiety and depression 02/03/2021   Weight gain 07/04/2019   Body mass index 39.0-39.9, adult 05/28/2019   Body mass index 37.0-37.9, adult 04/08/2019   Body mass index 38.0-38.9, adult 02/04/2019   Body mass index 40.0-44.9, adult ([place]) 11/01/2018   Encounter for well woman exam with routine gynecological exam 10/08/2018   Weight loss counseling, encounter for 10/08/2018   Body mass index 45.0-49.9, adult ([place]) 10/08/2018   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 present 05/30/2016   Surveillance for birth control, oral contraceptives 05/30/2016   Family planning 05/30/2016   Screening for colorectal cancer 05/30/2016   Vaginal irritation 08/29/2013   BV (bacterial vaginosis) 08/29/2013   Essential hypertension 03/21/2013   Contraceptive management 03/21/2013    PCP: Neale Burly, MD  REFERRING PROVIDER: Gillis Santa, MD Next apt scheduled after therapy to receive shots   REFERRING DIAG: M54.16,G89.29 (ICD-10-CM) -  Chronic radicular lumbar pain M51.16 (ICD-10-CM) - Lumbar disc herniation with radiculopathy M48.062 (ICD-10-CM) - Spinal stenosis of lumbar region with neurogenic claudication M54.16 (ICD-10-CM) - Lumbar radiculopathy  Rationale for Evaluation and Treatment: Rehabilitation  THERAPY DIAG:  Pain in left leg  Low back pain, unspecified back pain laterality, unspecified chronicity, unspecified  whether sciatica present  Difficulty in walking, not elsewhere classified  Muscle weakness (generalized)  Left-sided low back pain with left-sided sciatica, unspecified chronicity  ONSET DATE: 6 months  SUBJECTIVE:                                                                                                                                                                                           SUBJECTIVE STATEMENT: Reports she got injection Rt knee with some relief.  Plans for injection in back in [place] following therapy, plans to be done next week.  Purchased compression garments to assist with swelling.  Reports some relief in radicular symptoms BLE since last session, pain scale 5/10.  Reports compliance with HEP daily.  Has began the HEP with minimal changes.  Continues to have LBP with radicular symptoms anterior/lateral from back to Lt to bottom of foot and and just past Rt knee.  Reports some relief sitting but pain in constant.  MRI scheduled for Rt knee 03/25/22.     PERTINENT HISTORY:  CNA  PAIN:  Are you having pain? Yes: NPRS scale: 5/10 Pain location: low back and into both legs in calf/ankle Pain description: aggravating, sore, burning, throbbing Aggravating factors: walking, standing Relieving factors: sitting  PRECAUTIONS: None  WEIGHT BEARING RESTRICTIONS: No  FALLS:  Has patient fallen in last 6 months? 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 No intravenous contrast was administered.   COMPARISON:   12/17/2019   FINDINGS: Segmentation:  Standard.   Alignment:  Physiologic.   Vertebrae:  No fracture, evidence of discitis, or bone lesion.   Conus medullaris and cauda equina: Conus extends to the L1 level. Conus and cauda equina appear normal.   Paraspinal and other soft tissues: Negative   Disc levels:   T11-12 and T12-L1 are normal.   L1-L2: Normal disc space and facet joints. No spinal canal  stenosis. No neural foraminal stenosis.   L2-L3: Small left asymmetric disc bulge. No spinal canal stenosis. No neural foraminal stenosis.   L3-L4: Large left subarticular disc extrusion with inferior migration to the infrapedicle level with marked mass effect on the left-sided nerve roots in the lateral recess. Severe spinal canal stenosis. No neural foraminal stenosis.   L4-L5: Small central disc protrusion. Mild spinal canal stenosis. Mild left neural foraminal stenosis.   L5-S1: Normal disc space and facet joints. No spinal canal stenosis. No neural foraminal stenosis.   Visualized sacrum: Normal.   IMPRESSION: 1. Large left subarticular disc extrusion at L3-L4 with inferior migration to the infrapedicle level with marked mass effect on the left-sided nerve roots in the lateral recess and severe spinal canal stenosis. 2. Mild spinal canal and left neural foraminal stenosis at L4-L5.     Electronically Signed   By: Ulyses Jarred M.D.   On: 02/05/2022 13:09  PATIENT SURVEYS:  FOTO 22   COGNITION: Overall cognitive status: Within functional limits for tasks assessed     SENSATION: Both legs; left leg stays numb; right side occasionally goes numb  POSTURE: rounded shoulders, forward head, and decreased lumbar lordosis  PALPATION: Tender L3-S1 area  LUMBAR ROM:   AROM eval  Flexion 70% *available  Extension 40% available*  Right lateral flexion   Left lateral flexion   Right rotation   Left rotation    (Blank rows = not tested)    LOWER EXTREMITY  MMT:    MMT Right eval Left eval  Hip flexion 4+ 3+*  Hip extension 4 3-  Hip abduction    Hip adduction    Hip internal rotation    Hip external rotation    Knee flexion 4+ 4-  Knee extension 4+ 4*  Ankle dorsiflexion 5 4*  Ankle plantarflexion    Ankle inversion    Ankle eversion     (Blank rows = not tested)    FUNCTIONAL TESTS:  5 times sit to stand: 19.75 sec  GAIT: Distance walked: 80 ft Assistive device utilized: None Level of assistance: SBA Comments: decreased stance phase left leg; antalgic gait and decreased gait speed  TODAY'S TREATMENT:                                                                                                                              DATE:  03/31/22: Standing:  3D hip excursion (weight shifting, rotation, STS then standing upright, increased pain with lumbar extension) 10   Prone:  POE: 2 min no improvement with radicular symptoms  Supine: march with ab set 10x  Modified dead bug with hooklying position 10x Sidelying: clam with GTB. 10x 5" BLE  03/29/22 Prone: Prone lying x 2' no improvement pain into calves Prone press up x 5 reps increasing pain in legs so d/c  Supine: LTR x 20 SKTC 5 x 20" using towel to assist Hamstring stretch using a towel 5 x 20"  Abdominal bracing 5" hold x 15 Abdominal bracing with march 2 x 10 SAQ's 2 x 10 each   03/18/22 Prone: Lying prone x 2' Prone press ups x 8 Glute sets 3" hold x 10  Supine: Transverse abd contraction 5" hold x 10 Bridge with 3" x 10 Bridge with ball squeeze for hip adduction x 10 Bridge with belt for hip abduction x 10 SAQ's x 10 each  Standing: Slant board 5 x 10" (started to cramp so d/c) Scapular retractions 2 x 10   03/15/22: Reviewed goals  Prone: POE x 2 min Prone partial press up 5x 10" with reports of decreased radicular symptoms Heel squeeze 5x 5"  Supine:  Deep breathing x 3', abdominal set paired with exhalation. March 5x 3" holds  with ab set increased pain Rt knee AROM: 0-133 degrees Bridge 10x 5"   Physical therapy evaluation and HEP instruction    Education details: Patient educated on exam findings, POC, scope of PT, HEP. Person educated: Patient Education method: Explanation, Demonstration, and Handouts Education comprehension: verbalized understanding, returned demonstration, verbal cues required, and tactile cues required  HOME EXERCISE PROGRAM: 03/31/22: Marching, clam with GTB 03/18/22 prone glute sets, hamstring curls Access Code: [facility] URL: [email or web address] 03/15/22: POE, prone press up, abdominal sets, bridge  Date: 03/08/2022 Prepared by: [facility]  Exercises - Lying Prone  - 1 x daily - 7 x weekly - 3 sets - 10 reps  ASSESSMENT:  CLINICAL IMPRESSION: Extension based exercises increases pain and radicular symptoms, today's session focus with flexion based exercises for core and proximal strengthening with reports of radicular symptoms resolved.  Updated HEP with flexion based for core and gluteal strengthening and encouraged pt to stop prone exercises.  Pt reports decreased radicular symptoms at EOS.  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  Encouraged pt to call and discuss receiving injection for pain control and continue therapy.  OBJECTIVE IMPAIRMENTS: Abnormal gait, decreased activity tolerance, decreased balance, decreased coordination, decreased endurance, decreased knowledge of condition, decreased mobility, difficulty walking, decreased ROM, decreased strength, hypomobility, increased fascial restrictions, impaired perceived functional ability, increased muscle spasms, impaired flexibility, impaired sensation, impaired tone, and pain.   ACTIVITY LIMITATIONS: carrying, lifting, bending, sitting, standing, squatting, sleeping, stairs, transfers, bed mobility,  bathing, toileting, dressing, reach over head, hygiene/grooming, locomotion level, and caring for others  PARTICIPATION LIMITATIONS: meal prep, cleaning, laundry, driving, shopping, community activity, occupation, and yard work  [employer] POTENTIAL: Good  CLINICAL DECISION MAKING: Stable/uncomplicated  EVALUATION COMPLEXITY: Low   GOALS: Goals reviewed with patient? No  SHORT TERM GOALS: Target date: 03/22/2022  Patient will be independent with initial HEP  Baseline: Goal status: IN PROGRESS  2.  Patient will improve 5 times sit to stand score from 19.75 sec to 17 sec to demonstrate improved functional mobility and increased lower extremity strength.  Baseline:  Goal status: IN PROGRESS  LONG TERM GOALS: Target date: 04/05/2022  Patient will be independent in self management strategies to improve quality of life and functional outcomes.  Baseline:  Goal status: IN PROGRESS  2.   Patient will report at least 50% improvement in overall symptoms and/or function to demonstrate improved functional mobility  Baseline:  Goal status: IN PROGRESS  3.  Patient will improve 5 times sit to stand score from 19.75 sec to 15 sec to demonstrate improved functional mobility and increased lower extremity strength.  Baseline:  Goal status: IN PROGRESS  4.  Patient will increase lower extremity MMTs to 4+-5/5 without pain to promote return to ambulation community distances with minimal deviation.  Baseline:  Goal status: IN PROGRESS  PLAN:  PT FREQUENCY: 2x/week  PT DURATION: 4 weeks  Therapeutic exercises, Therapeutic activity, Neuromuscular re-education, Balance training, Gait training, Patient/Family education, Joint manipulation, Joint mobilization, Stair training, Orthotic/Fit training, DME instructions, Aquatic Therapy, Dry Needling, Electrical stimulation, Spinal manipulation, Spinal mobilization, Cryotherapy, Moist heat, Compression bandaging, scar mobilization, Splintting, Taping,  Traction, Ultrasound, Ionotophoresis '4mg'$ /ml Dexamethasone, and Manual therapy   PLAN FOR NEXT SESSION: Progress lumbar mobility and core/knee strengthening.  Hold on extension based exercises.  Review goals next session per cert.  Has 2 more apts through Medicaid.    Ihor Austin, LPTA/CLT; CBIS [phone]  11:45 AM, 03/31/22

## 2022-04-04 DIAGNOSIS — Z0000 Encounter for general adult medical examination without abnormal findings: Secondary | ICD-10-CM | POA: Diagnosis not present

## 2022-04-04 DIAGNOSIS — Z6841 Body Mass Index (BMI) 40.0 and over, adult: Secondary | ICD-10-CM | POA: Diagnosis not present

## 2022-04-05 ENCOUNTER — Encounter (HOSPITAL_COMMUNITY): Payer: Medicaid Other

## 2022-04-06 ENCOUNTER — Other Ambulatory Visit: Payer: Self-pay | Admitting: Orthopedic Surgery

## 2022-04-06 DIAGNOSIS — M48061 Spinal stenosis, lumbar region without neurogenic claudication: Secondary | ICD-10-CM

## 2022-04-08 ENCOUNTER — Ambulatory Visit (HOSPITAL_COMMUNITY): Payer: Medicaid Other

## 2022-04-08 DIAGNOSIS — M5442 Lumbago with sciatica, left side: Secondary | ICD-10-CM | POA: Diagnosis not present

## 2022-04-08 DIAGNOSIS — M79605 Pain in left leg: Secondary | ICD-10-CM | POA: Diagnosis not present

## 2022-04-08 DIAGNOSIS — R262 Difficulty in walking, not elsewhere classified: Secondary | ICD-10-CM

## 2022-04-08 DIAGNOSIS — M545 Low back pain, unspecified: Secondary | ICD-10-CM

## 2022-04-08 DIAGNOSIS — R29898 Other symptoms and signs involving the musculoskeletal system: Secondary | ICD-10-CM | POA: Diagnosis not present

## 2022-04-08 DIAGNOSIS — M25561 Pain in right knee: Secondary | ICD-10-CM | POA: Diagnosis not present

## 2022-04-08 DIAGNOSIS — M6281 Muscle weakness (generalized): Secondary | ICD-10-CM

## 2022-04-08 NOTE — Therapy (Signed)
OUTPATIENT PHYSICAL THERAPY THORACOLUMBAR PROGRESS/ DISCHARGE NOTE  PHYSICAL THERAPY DISCHARGE SUMMARY  Visits from Start of Care: 6  Current functional level related to goals / functional outcomes: See below   Remaining deficits: See below   Education / Equipment: See below   Patient agrees to discharge. Patient goals were not met. Patient is being discharged due to did not respond to therapy.   Progress Note Reporting Period 03/08/2022 to 04/08/2022  See note below for Objective Data and Assessment of Progress/Goals.       Patient Name: Shawntell Dixson MRN: [id number] DOB:[birth year], 51 y.o., female Today's Date: 04/08/2022  END OF SESSION:  PT End of Session - 04/08/22 0911     Visit Number 6    Number of Visits 8    Date for PT Re-Evaluation 04/05/22    Authorization Type [place] Medicaid [employer]- 7 visits approved    Authorization Time Period 7 visits approved from 11/21-->05/06/21    Authorization - Number of Visits 7    Progress Note Due on Visit 7    PT Start Time 0910    PT Stop Time 0940    PT Time Calculation (min) 30 min    Activity Tolerance Patient tolerated treatment well    Behavior During Therapy [facility] for tasks assessed/performed               Past Medical History:  Diagnosis Date   BV (bacterial vaginosis) 08/29/2013   Contraceptive management 03/21/2013   GERD (gastroesophageal reflux disease)    Hypertension    Obesity    Vaginal irritation 08/29/2013   Past Surgical History:  Procedure Laterality Date   CHOLECYSTECTOMY     COLONOSCOPY WITH PROPOFOL N/A 07/30/2021   Procedure: COLONOSCOPY WITH PROPOFOL;  Surgeon: Eloise Harman, DO;  Location: [facility];  Service: Endoscopy;  Laterality: N/A;  2:45pm   POLYPECTOMY  07/30/2021   Procedure: POLYPECTOMY;  Surgeon: Eloise Harman, DO;  Location: [facility];  Service: Endoscopy;;   Patient Active Problem List   Diagnosis Date Noted   Encounter for screening fecal occult  blood testing 03/23/2022   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) 02/15/2022   Lumbar radiculopathy 02/15/2022   Spinal stenosis of lumbar region with neurogenic claudication 02/15/2022   Chronic pain syndrome 02/15/2022   Acute bilateral low back pain with bilateral sciatica 12/30/2021   Skin excoriation 12/14/2021   Superficial fungus infection of skin 12/06/2021   Vaginal yeast infection 12/06/2021   Vaginal itching 12/06/2021   Vaginal burning 12/06/2021   Visit for suture removal 12/02/2021   Burning with urination 11/16/2021   Pain and swelling of right knee 10/29/2021   Encounter for screening colonoscopy 06/29/2021   Screening examination for STD (sexually transmitted disease) 05/06/2021   Epidermal cyst 05/06/2021   Screening mammogram for breast cancer 02/03/2021   Routine general medical examination at a health care facility 02/03/2021   Encounter for gynecological examination with Papanicolaou smear of cervix 02/03/2021   Encounter for surveillance of contraceptive pills 02/03/2021   Anxiety and depression 02/03/2021   Weight gain 07/04/2019   Body mass index 39.0-39.9, adult 05/28/2019   Body mass index 37.0-37.9, adult 04/08/2019   Body mass index 38.0-38.9, adult 02/04/2019   Body mass index 40.0-44.9, adult ([place]) 11/01/2018   Encounter for well woman exam with routine gynecological exam 10/08/2018   Weight loss counseling, encounter for 10/08/2018   Body mass index 45.0-49.9, adult ([place]) 10/08/2018   Pap smear of cervix shows high  risk HPV present 05/30/2016   Surveillance for birth control, oral contraceptives 05/30/2016   Family planning 05/30/2016   Screening for colorectal cancer 05/30/2016   Vaginal irritation 08/29/2013   BV (bacterial vaginosis) 08/29/2013   Essential hypertension 03/21/2013   Contraceptive management 03/21/2013    PCP: Neale Burly, MD  REFERRING PROVIDER: Gillis Santa, MD Next apt scheduled after therapy to receive shots    REFERRING DIAG: M54.16,G89.29 (ICD-10-CM) - Chronic radicular lumbar pain M51.16 (ICD-10-CM) - Lumbar disc herniation with radiculopathy M48.062 (ICD-10-CM) - Spinal stenosis of lumbar region with neurogenic claudication M54.16 (ICD-10-CM) - Lumbar radiculopathy  Rationale for Evaluation and Treatment: Rehabilitation  THERAPY DIAG:  Pain in left leg  Low back pain, unspecified back pain laterality, unspecified chronicity, unspecified whether sciatica present  Difficulty in walking, not elsewhere classified  Muscle weakness (generalized)  Left-sided low back pain with left-sided sciatica, unspecified chronicity  ONSET DATE: 6 months  SUBJECTIVE:                                                                                                                                                                                           SUBJECTIVE STATEMENT: Right knee injection is not longer helping; may try another type next visit with her MD.  Reports minimal change regarding pain she may be moving a little better.    Has began the HEP with minimal changes.  Continues to have LBP with radicular symptoms anterior/lateral from back to Lt to bottom of foot and and just past Rt knee.  Reports some relief sitting but pain in constant.  MRI scheduled for Rt knee 03/25/22.     PERTINENT HISTORY:  CNA  PAIN:  Are you having pain? Yes: NPRS scale: 5/10 Pain location: low back and into both legs in calf/ankle Pain description: aggravating, sore, burning, throbbing Aggravating factors: walking, standing Relieving factors: sitting  PRECAUTIONS: None  WEIGHT BEARING RESTRICTIONS: No  FALLS:  Has patient fallen in last 6 months? 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 No intravenous contrast was administered.   COMPARISON:  12/17/2019   FINDINGS: Segmentation:  Standard.   Alignment:  Physiologic.   Vertebrae:  No fracture, evidence of discitis,  or bone lesion.   Conus medullaris and cauda equina: Conus extends to the L1 level. Conus and cauda equina appear normal.   Paraspinal and other soft tissues: Negative   Disc levels:   T11-12 and T12-L1 are normal.   L1-L2: Normal disc space and facet joints. No spinal canal stenosis. No neural foraminal stenosis.   L2-L3: Small left asymmetric disc bulge. No spinal canal stenosis. No neural foraminal stenosis.   L3-L4: Large left subarticular disc extrusion with inferior migration to the infrapedicle level with marked mass effect on the left-sided nerve roots in the lateral recess. Severe spinal canal stenosis. No neural foraminal stenosis.   L4-L5: Small central disc protrusion. Mild spinal canal stenosis. Mild left neural foraminal stenosis.   L5-S1: Normal disc space and facet joints. No spinal canal stenosis. No neural foraminal stenosis.   Visualized sacrum: Normal.   IMPRESSION: 1. Large left subarticular disc extrusion at L3-L4 with inferior migration to the infrapedicle level with marked mass effect on the left-sided nerve roots in the lateral recess and severe spinal canal stenosis. 2. Mild spinal canal and left neural foraminal stenosis at L4-L5.     Electronically Signed   By: Ulyses Jarred M.D.   On: 02/05/2022 13:09  PATIENT SURVEYS:  FOTO 22   COGNITION: Overall cognitive status: Within functional limits for tasks assessed     SENSATION: Both legs; left leg stays numb; right side occasionally goes numb  POSTURE: rounded shoulders,  forward head, and decreased lumbar lordosis  PALPATION: Tender L3-S1 area  LUMBAR ROM:   AROM eval 04/08/22  Flexion 70% *available 75% available  Extension 40% available* 20% available*  Right lateral flexion    Left lateral flexion    Right rotation    Left rotation     (Blank rows = not tested)    LOWER EXTREMITY MMT:    MMT Right eval Left eval Right 04/08/22 Left 04/08/22  Hip flexion 4+ 3+* 4 4  Hip extension 4 3- 4- 3-  Hip abduction      Hip adduction      Hip internal rotation      Hip external rotation      Knee flexion 4+ 4- 4+ 4+  Knee extension 4+ 4* 4+ 4+  Ankle dorsiflexion 5 4* 5 4+  Ankle plantarflexion      Ankle inversion      Ankle eversion       (Blank rows = not tested)    FUNCTIONAL TESTS:  5 times sit to stand: 19.75 sec  GAIT: Distance walked: 80 ft Assistive device utilized: None Level of assistance: SBA Comments: decreased stance phase left leg; antalgic gait and decreased gait speed  TODAY'S TREATMENT:                                                                                                                              DATE:  04/08/22 Progress note 5 x sit to stand  19.53 sec MMT's FOTO 32  Prone: Glute sets 5" x 10 Hamstring curls x 10 each  Supine: LTR x 10 Abdominal bracing 5" hold x 10 Bridge x 10 Abdominal bracing with march x 10 each  Updated HEP      03/31/22: Standing:  3D hip excursion (weight shifting, rotation, STS then standing upright, increased pain with lumbar extension) 10   Prone:  POE: 2 min no improvement with radicular symptoms  Supine: march with ab set 10x  Modified dead bug with hooklying position 10x Sidelying: clam with GTB. 10x 5" BLE  03/29/22 Prone: Prone lying x 2' no improvement pain into calves Prone press up x 5 reps increasing pain in legs so d/c  Supine: LTR x 20 SKTC 5 x 20" using towel to assist Hamstring stretch using a towel 5 x 20" Abdominal bracing 5"  hold x 15 Abdominal bracing with march 2 x 10 SAQ's 2 x 10 each   03/18/22 Prone: Lying prone x 2' Prone press ups x 8 Glute sets 3" hold x 10  Supine: Transverse abd contraction 5" hold x 10 Bridge with 3" x 10 Bridge with ball squeeze for hip adduction x 10 Bridge with belt for hip abduction x 10 SAQ's x 10 each  Standing: Slant board 5 x 10" (started to cramp so d/c) Scapular retractions 2 x 10   03/15/22: Reviewed goals  Prone: POE x 2 min Prone partial press up 5x 10" with reports of decreased radicular symptoms Heel squeeze 5x 5"  Supine:  Deep breathing x 3', abdominal set paired with exhalation. March 5x 3" holds with ab set increased pain Rt knee AROM: 0-133 degrees Bridge 10x 5"   Physical therapy evaluation and HEP instruction    Education details: Patient educated on exam findings, POC, scope of PT, HEP. Person educated: Patient Education method: Explanation, Demonstration, and Handouts Education comprehension: verbalized understanding, returned demonstration, verbal cues required, and tactile cues required  HOME EXERCISE PROGRAM: 04/08/22 Access Code: [email or web address] URL: [email or web address] Date: 04/08/2022 Prepared by: [facility]  Exercises - Lying Prone  - 1 x daily - 7 x weekly - 3 sets - 10 reps - Prone on Elbows Stretch  - 1 x daily - 7 x weekly - 2 sets - 2 reps - 2' hold - Prone Press Up  - 1 x daily - 7 x weekly - 3 sets - 5 reps - 10" hold - Supine Transversus Abdominis Bracing - Hands on Ground  - 1 x daily - 7 x weekly - 3 sets - 10 reps - 3" hold - Supine Bridge  - 1 x daily - 7 x weekly - 3 sets - 10 reps - 5" hold - Prone Gluteal Sets  - 2 x daily - 7 x weekly - 1 sets - 10 reps - 3 sec hold - Prone Knee Flexion  - 2 x daily - 7 x weekly - 1 sets - 10 reps - Supine March  - 1 x daily - 7 x weekly - 3 sets - 10 reps - Clam with Resistance  - 1 x daily - 7 x weekly - 2 sets - 10 reps - 5" hold - Supine Lower Trunk  Rotation  - 1 x daily - 7 x weekly - 3 sets - 10 reps 03/31/22: Marching, clam with GTB 03/18/22 prone glute sets, hamstring curls Access Code: [facility] URL: [email or web address] 03/15/22: POE, prone press up, abdominal sets, bridge  Date: 03/08/2022 Prepared by: [facility]  Exercises - Lying Prone  - 1 x daily - 7 x weekly - 3 sets - 10 reps  ASSESSMENT:  CLINICAL IMPRESSION: Progress note today; minimal changes in subjective complaints; mild improvement in FOTO score and with strength testing although shows a decline in hip extension strength and lumbar extension mobility.   Discussed with patient continuing with her exercises at home and possibly trying aquatic exercise to decrease load on joints and decrease pain levels.  She is planning to have an injection in her back and will call to follow up on that appointment. Updated HEP. Patient is agreeable to discharge at this time due to minimal progress with therapy.   OBJECTIVE IMPAIRMENTS: Abnormal gait, decreased activity tolerance, decreased balance, decreased coordination, decreased endurance, decreased knowledge of condition, decreased mobility, difficulty walking, decreased ROM, decreased strength, hypomobility, increased fascial restrictions, impaired perceived functional ability, increased muscle spasms, impaired flexibility, impaired sensation, impaired tone, and pain.   ACTIVITY LIMITATIONS: carrying, lifting, bending, sitting, standing, squatting, sleeping, stairs, transfers, bed mobility, bathing, toileting, dressing, reach over head, hygiene/grooming, locomotion level, and caring for others  PARTICIPATION LIMITATIONS: meal prep, cleaning, laundry, driving, shopping, community activity, occupation, and yard work  [employer] POTENTIAL: Good  CLINICAL DECISION MAKING: Stable/uncomplicated  EVALUATION COMPLEXITY: Low   GOALS: Goals reviewed with patient? No  SHORT TERM GOALS: Target date: 03/22/2022  Patient  will be independent with initial HEP  Baseline: Goal status: MET  2.  Patient will improve 5 times sit to stand score from 19.75 sec to 17 sec to demonstrate improved functional mobility and increased lower extremity strength.  Baseline:  Goal status: NOT MET  LONG TERM GOALS: Target date: 04/05/2022  Patient will be independent in self management strategies to improve quality of life and functional outcomes.  Baseline:  Goal status: NOT MET  2.   Patient will report at least 50% improvement in overall symptoms and/or function to demonstrate improved functional mobility  Baseline:  Goal status: NOT MET  3.  Patient will improve 5 times sit to stand score from 19.75 sec to 15 sec to demonstrate improved functional mobility and increased lower extremity strength.  Baseline:  Goal status: NOT MET  4.  Patient will increase lower extremity MMTs to 4+-5/5 without pain to promote return to ambulation community distances with minimal deviation.  Baseline:  Goal status: NOT MET  PLAN:  PT FREQUENCY: 2x/week  PT DURATION: 4 weeks  Therapeutic exercises, Therapeutic activity, Neuromuscular re-education, Balance training, Gait training, Patient/Family education, Joint manipulation, Joint mobilization, Stair training, Orthotic/Fit training, DME instructions, Aquatic Therapy, Dry Needling, Electrical stimulation, Spinal manipulation, Spinal mobilization, Cryotherapy, Moist heat, Compression bandaging, scar mobilization, Splintting, Taping, Traction, Ultrasound, Ionotophoresis 51m/ml Dexamethasone, and Manual therapy   PLAN FOR NEXT SESSION: discharge   9:45 AM, 04/08/22 Jadwiga Faidley Small Bryanah Sidell MPT CRelampagophysical therapy [facility] #[phone]:[phone]

## 2022-04-09 ENCOUNTER — Other Ambulatory Visit: Payer: Self-pay | Admitting: Orthopedic Surgery

## 2022-04-25 ENCOUNTER — Telehealth: Payer: Self-pay

## 2022-04-25 ENCOUNTER — Encounter: Payer: Self-pay | Admitting: Student in an Organized Health Care Education/Training Program

## 2022-04-25 ENCOUNTER — Ambulatory Visit
Admission: RE | Admit: 2022-04-25 | Discharge: 2022-04-25 | Disposition: A | Discharge status: Home / Self Care | Payer: Medicaid Other | Source: Ambulatory Visit | Attending: Student in an Organized Health Care Education/Training Program | Admitting: Student in an Organized Health Care Education/Training Program

## 2022-04-25 ENCOUNTER — Ambulatory Visit
Payer: Medicaid Other | Attending: Student in an Organized Health Care Education/Training Program | Admitting: Student in an Organized Health Care Education/Training Program

## 2022-04-25 DIAGNOSIS — G8929 Other chronic pain: Secondary | ICD-10-CM | POA: Insufficient documentation

## 2022-04-25 DIAGNOSIS — M5416 Radiculopathy, lumbar region: Secondary | ICD-10-CM | POA: Insufficient documentation

## 2022-04-25 DIAGNOSIS — M5116 Intervertebral disc disorders with radiculopathy, lumbar region: Secondary | ICD-10-CM | POA: Diagnosis not present

## 2022-04-25 DIAGNOSIS — M48062 Spinal stenosis, lumbar region with neurogenic claudication: Secondary | ICD-10-CM | POA: Diagnosis not present

## 2022-04-25 MED ORDER — ROPIVACAINE HCL 2 MG/ML IJ SOLN
INTRAMUSCULAR | Status: AC
  Filled 2022-04-25: qty 20

## 2022-04-25 MED ORDER — LIDOCAINE HCL (PF) 2 % IJ SOLN
INTRAMUSCULAR | Status: AC
  Filled 2022-04-25: qty 10

## 2022-04-25 MED ORDER — DIAZEPAM 5 MG PO TABS
5.0000 mg | ORAL_TABLET | ORAL | Status: AC
  Administered 2022-04-25: 5 mg via ORAL

## 2022-04-25 MED ORDER — SODIUM CHLORIDE (PF) 0.9 % IJ SOLN
INTRAMUSCULAR | Status: AC
  Filled 2022-04-25: qty 10

## 2022-04-25 MED ORDER — DIAZEPAM 5 MG PO TABS
ORAL_TABLET | ORAL | Status: AC
  Filled 2022-04-25: qty 1

## 2022-04-25 MED ORDER — DEXAMETHASONE SODIUM PHOSPHATE 10 MG/ML IJ SOLN
10.0000 mg | Freq: Once | INTRAMUSCULAR | Status: AC
  Administered 2022-04-25: 10 mg

## 2022-04-25 MED ORDER — IOHEXOL 180 MG/ML  SOLN
10.0000 mL | Freq: Once | INTRAMUSCULAR | Status: AC
  Administered 2022-04-25: 10 mL via EPIDURAL

## 2022-04-25 MED ORDER — LIDOCAINE HCL 2 % IJ SOLN
20.0000 mL | Freq: Once | INTRAMUSCULAR | Status: AC
  Administered 2022-04-25: 100 mg

## 2022-04-25 MED ORDER — ROPIVACAINE HCL 2 MG/ML IJ SOLN
2.0000 mL | Freq: Once | INTRAMUSCULAR | Status: AC
  Administered 2022-04-25: 2 mL via EPIDURAL

## 2022-04-25 MED ORDER — IOHEXOL 180 MG/ML  SOLN
INTRAMUSCULAR | Status: AC
  Filled 2022-04-25: qty 20

## 2022-04-25 MED ORDER — DEXAMETHASONE SODIUM PHOSPHATE 10 MG/ML IJ SOLN
INTRAMUSCULAR | Status: AC
  Filled 2022-04-25: qty 1

## 2022-04-25 MED ORDER — SODIUM CHLORIDE 0.9% FLUSH
2.0000 mL | Freq: Once | INTRAVENOUS | Status: AC
  Administered 2022-04-25: 2 mL

## 2022-04-25 NOTE — Telephone Encounter (Signed)
She can be scheduled at 3:30pm on 05/17/22. If someone cancels for the week before, she can move up. I put a letter in her chart for her to stay out of work until the appt on 1/30.

## 2022-04-25 NOTE — Patient Instructions (Signed)

## 2022-04-25 NOTE — Progress Notes (Signed)
PROVIDER NOTE: Interpretation of information contained herein should be left to medically-trained personnel. Specific patient instructions are provided elsewhere under "Patient Instructions" section of medical record. This document was created in part using STT-dictation technology, any transcriptional errors that may result from this process are unintentional.  Patient: Marie Mills Type: Established DOB: 1971/01/12 MRN: [id number] PCP: Neale Burly, MD  Service: Procedure DOS: 04/25/2022 Setting: Ambulatory Location: Ambulatory outpatient facility Delivery: Face-to-face Provider: Gillis Santa, MD Specialty: Interventional Pain Management Specialty designation: 09 Location: Outpatient facility Ref. Prov.: Gillis Santa, MD    Primary Reason for Visit: Interventional Pain Management Treatment. CC: Back Pain   Procedure:           Type: Lumbar epidural steroid injection (LESI) (interlaminar) #1    Laterality: Midline   Level:  L3-4 Level.  Imaging: Fluoroscopic guidance         Anesthesia: Local anesthesia (1-2% Lidocaine) Anxiolysis: Oral Valium 5 mg PO DOS: 04/25/2022  Performed by: Gillis Santa, MD  Purpose: Diagnostic/Therapeutic Indications: Lumbar radicular pain of intraspinal etiology of more than 4 weeks that has failed to respond to conservative therapy and is severe enough to impact quality of life or function. 1. 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 Spinal stenosis of lumbar region with neurogenic claudication   4. Lumbar radiculopathy    NAS-11 Pain score:   Pre-procedure: 3 /10   Post-procedure: 3 /10      Position / Prep / Materials:  Position: Prone w/ head of the table raised (slight reverse trendelenburg) to facilitate breathing.  Prep solution: DuraPrep (Iodine Povacrylex [0.7% available iodine] and Isopropyl Alcohol, 74% w/w) Prep Area: Entire Posterior Lumbar Region from lower scapular  tip down to mid buttocks area and from flank to flank. Materials:  Tray: Epidural tray Needle(s):  Type: Epidural needle (Tuohy) Gauge (G):  17 Length: Regular (3.5-in) Qty: 1  Pre-op H&P Assessment:  Ms. Huebert is a 52 y.o. (year old), female patient, seen today for interventional treatment. She  has a past surgical history that includes Cholecystectomy; Colonoscopy with propofol (N/A, 07/30/2021); and polypectomy (07/30/2021). Ms. Schinke has a current medication list which includes the following prescription(s): acetaminophen, amlodipine, aspirin ec, azelastine, budesonide, cetirizine, fluticasone, gabapentin, ibuprofen, ipratropium, lidocaine, losartan, magnesium gluconate, metoprolol succinate, montelukast, norethindrone, nystatin cream, omeprazole, potassium chloride, pyridoxine, sulindac, tizanidine, and valacyclovir. Her primarily concern today is the Back Pain  Initial Vital Signs:  Pulse/HCG Rate:  ECG Heart Rate: 91 Temp: (!) 97.3 F (36.3 C) Resp: 16 BP: (!) 181/96 SpO2: 100 %  BMI: Estimated body mass index is 41.97 kg/m as calculated from the following:   Height as of this encounter: '5\' 6"'$  (1.676 m).   Weight as of this encounter: 260 lb (117.9 kg).  Risk Assessment: Allergies: Reviewed. She is allergic to penicillins, codeine, and sulfa antibiotics.  Allergy Precautions: None required Coagulopathies: Reviewed. None identified.  Blood-thinner therapy: None at this time Active Infection(s): Reviewed. None identified. Ms. Kirkey is afebrile  Site Confirmation: Ms. Mille was asked to confirm the procedure and laterality before marking the site Procedure checklist: Completed Consent: Before the procedure and under the influence of no sedative(s), amnesic(s), or anxiolytics, the patient was informed of the treatment options, risks and possible complications. To fulfill our ethical and legal obligations, as recommended by the American Medical Association's Code of  Ethics, I have informed the patient of my clinical impression; the nature and purpose of the  treatment or procedure; the risks, benefits, and possible complications of the intervention; the alternatives, including doing nothing; the risk(s) and benefit(s) of the alternative treatment(s) or procedure(s); and the risk(s) and benefit(s) of doing nothing. The patient was provided information about the general risks and possible complications associated with the procedure. These may include, but are not limited to: failure to achieve desired goals, infection, bleeding, organ or nerve damage, allergic reactions, paralysis, and death. In addition, the patient was informed of those risks and complications associated to Spine-related procedures, such as failure to decrease pain; infection (i.e.: Meningitis, epidural or intraspinal abscess); bleeding (i.e.: epidural hematoma, subarachnoid hemorrhage, or any other type of intraspinal or peri-dural bleeding); organ or nerve damage (i.e.: Any type of peripheral nerve, nerve root, or spinal cord injury) with subsequent damage to sensory, motor, and/or autonomic systems, resulting in permanent pain, numbness, and/or weakness of one or several areas of the body; allergic reactions; (i.e.: anaphylactic reaction); and/or death. Furthermore, the patient was informed of those risks and complications associated with the medications. These include, but are not limited to: allergic reactions (i.e.: anaphylactic or anaphylactoid reaction(s)); adrenal axis suppression; blood sugar elevation that in diabetics may result in ketoacidosis or comma; water retention that in patients with history of congestive heart failure may result in shortness of breath, pulmonary edema, and decompensation with resultant heart failure; weight gain; swelling or edema; medication-induced neural toxicity; particulate matter embolism and blood vessel occlusion with resultant organ, and/or nervous system  infarction; and/or aseptic necrosis of one or more joints. Finally, the patient was informed that Medicine is not an exact science; therefore, there is also the possibility of unforeseen or unpredictable risks and/or possible complications that may result in a catastrophic outcome. The patient indicated having understood very clearly. We have given the patient no guarantees and we have made no promises. Enough time was given to the patient to ask questions, all of which were answered to the patient's satisfaction. Ms. Nowakowski has indicated that she wanted to continue with the procedure. Attestation: I, the ordering provider, attest that I have discussed with the patient the benefits, risks, side-effects, alternatives, likelihood of achieving goals, and potential problems during recovery for the procedure that I have provided informed consent. Date  Time: 04/25/2022 11:40 AM  Pre-Procedure Preparation:  Monitoring: As per clinic protocol. Respiration, ETCO2, SpO2, BP, heart rate and rhythm monitor placed and checked for adequate function Safety Precautions: Patient was assessed for positional comfort and pressure points before starting the procedure. Time-out: I initiated and conducted the "Time-out" before starting the procedure, as per protocol. The patient was asked to participate by confirming the accuracy of the "Time Out" information. Verification of the correct person, site, and procedure were performed and confirmed by me, the nursing staff, and the patient. "Time-out" conducted as per Joint Commission's Universal Protocol (UP.01.01.01). Time: 1153  Description/Narrative of Procedure:          Target: Epidural space via interlaminar opening, initially targeting the lower laminar border of the superior vertebral body. Region: Lumbar Approach: Percutaneous paravertebral  Rationale (medical necessity): procedure needed and proper for the diagnosis and/or treatment of the patient's medical  symptoms and needs. Procedural Technique Safety Precautions: Aspiration looking for blood return was conducted prior to all injections. At no point did we inject any substances, as a needle was being advanced. No attempts were made at seeking any paresthesias. Safe injection practices and needle disposal techniques used. Medications properly checked for expiration dates. SDV (single dose vial)  medications used. Description of the Procedure: Protocol guidelines were followed. The procedure needle was introduced through the skin, ipsilateral to the reported pain, and advanced to the target area. Bone was contacted and the needle walked caudad, until the lamina was cleared. The epidural space was identified using "loss-of-resistance technique" with 2-3 ml of PF-NaCl (0.9% NSS), in a 5cc LOR glass syringe.  Vitals:   04/25/22 1143 04/25/22 1150 04/25/22 1155 04/25/22 1202  BP: (!) 181/96 (!) 155/81 (!) 160/88 (!) 172/83  Resp: '16 18 16 18  '$ Temp: (!) 97.3 F (36.3 C)     SpO2: 100% 100% 100% 100%  Weight: 260 lb (117.9 kg)     Height: '5\' 6"'$  (1.676 m)       Start Time: 1153 hrs. End Time: 1201 hrs.  Imaging Guidance (Spinal):          Type of Imaging Technique: Fluoroscopy Guidance (Spinal) Indication(s): Assistance in needle guidance and placement for procedures requiring needle placement in or near specific anatomical locations not easily accessible without such assistance. Exposure Time: Please see nurses notes. Contrast: Before injecting any contrast, we confirmed that the patient did not have an allergy to iodine, shellfish, or radiological contrast. Once satisfactory needle placement was completed at the desired level, radiological contrast was injected. Contrast injected under live fluoroscopy. No contrast complications. See chart for type and volume of contrast used. Fluoroscopic Guidance: I was personally present during the use of fluoroscopy. "Tunnel Vision Technique" used to obtain the  best possible view of the target area. Parallax error corrected before commencing the procedure. "Direction-depth-direction" technique used to introduce the needle under continuous pulsed fluoroscopy. Once target was reached, antero-posterior, oblique, and lateral fluoroscopic projection used confirm needle placement in all planes. Images permanently stored in EMR. Interpretation: I personally interpreted the imaging intraoperatively. Adequate needle placement confirmed in multiple planes. Appropriate spread of contrast into desired area was observed. No evidence of afferent or efferent intravascular uptake. No intrathecal or subarachnoid spread observed. Permanent images saved into the patient's record.  Antibiotic Prophylaxis:   Anti-infectives (From admission, onward)    None      Indication(s): None identified  Post-operative Assessment:  Post-procedure Vital Signs:  Pulse/HCG Rate:  86 Temp: (!) 97.3 F (36.3 C) Resp: 18 BP: (!) 172/83 SpO2: 100 %  EBL: None  Complications: No immediate post-treatment complications observed by team, or reported by patient.  Note: The patient tolerated the entire procedure well. A repeat set of vitals were taken after the procedure and the patient was kept under observation following institutional policy, for this type of procedure. Post-procedural neurological assessment was performed, showing return to baseline, prior to discharge. The patient was provided with post-procedure discharge instructions, including a section on how to identify potential problems. Should any problems arise concerning this procedure, the patient was given instructions to immediately contact us, at any time, without hesitation. In any case, we plan to contact the patient by telephone for a follow-up status report regarding this interventional procedure.  Comments:  No additional relevant information.  Plan of Care  Orders:  Orders Placed This Encounter  Procedures   DG  PAIN CLINIC C-ARM 1-60 MIN NO REPORT    Intraoperative interpretation by procedural physician at [place].    Standing Status:   Standing    Number of Occurrences:   1    Order Specific Question:   Reason for exam:    Answer:   Assistance in needle guidance and placement for procedures  requiring needle placement in or near specific anatomical locations not easily accessible without such assistance.     Medications ordered for procedure: Meds ordered this encounter  Medications   iohexol (OMNIPAQUE) 180 MG/ML injection 10 mL    Must be Myelogram-compatible. If not available, you may substitute with a water-soluble, non-ionic, hypoallergenic, myelogram-compatible radiological contrast medium.   lidocaine (XYLOCAINE) 2 % (with pres) injection 400 mg   diazepam (VALIUM) tablet 5 mg    Make sure Flumazenil is available in the pyxis when using this medication. If oversedation occurs, administer 0.2 mg IV over 15 sec. If after 45 sec no response, administer 0.2 mg again over 1 min; may repeat at 1 min intervals; not to exceed 4 doses (1 mg)   ropivacaine (PF) 2 mg/mL (0.2%) (NAROPIN) injection 2 mL   sodium chloride flush (NS) 0.9 % injection 2 mL   dexamethasone (DECADRON) injection 10 mg   Medications administered: We administered iohexol, lidocaine, diazepam, ropivacaine (PF) 2 mg/mL (0.2%), sodium chloride flush, and dexamethasone.  See the medical record for exact dosing, route, and time of administration.  Follow-up plan:   Return in about 4 weeks (around 05/23/2022) for Post Procedure Evaluation, in person.       L3/4 ESI   Recent Visits Date Type Provider Dept  02/15/22 Office Visit Gillis Santa, MD [facility]  Showing recent visits within past 90 days and meeting all other requirements Today's Visits Date Type Provider Dept  04/25/22 Procedure visit Gillis Santa, MD [facility]  Showing today's visits and meeting all other requirements Future  Appointments Date Type Provider Dept  05/23/22 Appointment Gillis Santa, MD [facility]  Showing future appointments within next 90 days and meeting all other requirements  Disposition: Discharge home  Discharge (Date  Time): 04/25/2022; 1210 hrs.   Primary Care Physician: Neale Burly, MD Location: [facility] Note by: Gillis Santa, MD Date: 04/25/2022; Time: 1:16 PM  Disclaimer:  Medicine is not an exact science. The only guarantee in medicine is that nothing is guaranteed. It is important to note that the decision to proceed with this intervention was based on the information collected from the patient. The Data and conclusions were drawn from the patient's questionnaire, the interview, and the physical examination. Because the information was provided in large part by the patient, it cannot be guaranteed that it has not been purposely or unconsciously manipulated. Every effort has been made to obtain as much relevant data as possible for this evaluation. It is important to note that the conclusions that lead to this procedure are derived in large part from the available data. Always take into account that the treatment will also be dependent on availability of resources and existing treatment guidelines, considered by other Pain Management Practitioners as being common knowledge and practice, at the time of the intervention. For Medico-Legal purposes, it is also important to point out that variation in procedural techniques and pharmacological choices are the acceptable norm. The indications, contraindications, technique, and results of the above procedure should only be interpreted and judged by a Board-Certified Interventional Pain Specialist with extensive familiarity and expertise in the same exact procedure and technique.

## 2022-04-25 NOTE — Progress Notes (Signed)
Safety precautions to be maintained throughout the outpatient stay will include: orient to surroundings, keep bed in low position, maintain call bell within reach at all times, provide assistance with transfer out of bed and ambulation.  

## 2022-04-25 NOTE — Telephone Encounter (Signed)
-----   Message from Peggyann Shoals sent at 04/25/2022  2:58 PM EST ----- Regarding: workin appt and work note Contact: [phone] She had her ESI injection today. She needs a followup in 2 weeks with Dr.Yarbrough. Where can I over book? She would like a doctor's note to keep her out of work until she sees him. The note needs to be mailed to her.

## 2022-04-26 ENCOUNTER — Telehealth: Payer: Self-pay

## 2022-04-26 NOTE — Telephone Encounter (Signed)
Post procedure follow up.  Patient states she is doing good.  

## 2022-04-26 NOTE — Telephone Encounter (Signed)
Patient aware of appt and work note mailed to her.

## 2022-04-27 ENCOUNTER — Encounter: Payer: Self-pay | Admitting: Orthopedic Surgery

## 2022-04-27 ENCOUNTER — Ambulatory Visit: Payer: Medicaid Other | Admitting: Orthopedic Surgery

## 2022-04-27 DIAGNOSIS — Z01818 Encounter for other preprocedural examination: Secondary | ICD-10-CM

## 2022-04-27 DIAGNOSIS — M1711 Unilateral primary osteoarthritis, right knee: Secondary | ICD-10-CM

## 2022-04-27 DIAGNOSIS — M1712 Unilateral primary osteoarthritis, left knee: Secondary | ICD-10-CM

## 2022-04-27 DIAGNOSIS — D1723 Benign lipomatous neoplasm of skin and subcutaneous tissue of right leg: Secondary | ICD-10-CM | POA: Diagnosis not present

## 2022-04-27 DIAGNOSIS — M23321 Other meniscus derangements, posterior horn of medial meniscus, right knee: Secondary | ICD-10-CM

## 2022-04-27 NOTE — Patient Instructions (Signed)
Your surgery will be at [facility] by Dr Harrison  The hospital will contact you with a preoperative appointment to discuss Anesthesia.  Please arrive on time or 15 minutes early for the preoperative appointment, they have a very tight schedule if you are late or do not come in your surgery will be cancelled.  The phone number is [phone]. Please bring your medications with you for the appointment. They will tell you the arrival time and medication instructions when you have your preoperative evaluation. Do not wear nail polish the day of your surgery and if you take Phentermine you need to stop this medication ONE WEEK prior to your surgery. If you take Invokana, Farxiga, Jardiance, or Steglatro) - Hold 72 hours before the procedure.  If you take Ozempic,  Bydureon or Trulicity do not take for 8 days before your surgery. If you take Victoza, Rybelsis, Saxenda or Adlyxi stop 24 hours before the procedure.  Please arrive at the hospital 2 hours before procedure if scheduled at 9:30 or later in the day or at the time the nurse tells you at your preoperative visit.   If you have my chart do not use the time given in my chart use the time given to you by the nurse during your preoperative visit.   Your surgery  time may change. Please be available for phone calls the day of your surgery and the day before. The Short Stay department may need to discuss changes about your surgery time. Not reaching the you could lead to procedure delays and possible cancellation.  You must have a ride home and someone to stay with you for 24 to 48 hours. The person taking you home will receive and sign for the your discharge instructions.  Please be prepared to give your support person's name and telephone number to Central Registration. Dr Harrison will need that name and phone number post procedure.   

## 2022-04-27 NOTE — Progress Notes (Signed)
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  The medial femoral condylar partial-thickness cartilage loss is also corresponding with her medial symptoms as well  She wants to have a lipoma.  I talked her about possibly doing an arthroscopy to evaluate the joint and she is agreeable to that  Our recommendations are for arthroscopy right knee partial medial meniscectomy followed by lipoma.  She understands that this will keep her out of work for a longer period of time  She wants to use nonabsorbable sutures and skin glue if possible and I said we would try as long as it does not threaten the wound  Follow-up postop after surgery. IMPRESSION: MRI  1. Degeneration and fraying of the free edge of the posterior horn of the medial meniscus. Otherwise no discrete medial meniscal tear. 2. Partial-thickness cartilage loss of the lateral patellar facet with mild subchondral reactive marrow edema. 3. Focal high-grade partial-thickness cartilage loss of the weight-bearing surface of the medial femoral condyle and medial tibial plateau measuring 7 x 6 mm.  Electronically Signed   By: Kathreen Devoid M.D.   On: 03/26/2022 07:47

## 2022-04-30 ENCOUNTER — Other Ambulatory Visit: Payer: Self-pay | Admitting: Orthopedic Surgery

## 2022-04-30 DIAGNOSIS — M48061 Spinal stenosis, lumbar region without neurogenic claudication: Secondary | ICD-10-CM

## 2022-05-13 ENCOUNTER — Ambulatory Visit
Admission: RE | Admit: 2022-05-13 | Discharge: 2022-05-13 | Disposition: A | Discharge status: Home / Self Care | Payer: Medicaid Other | Source: Ambulatory Visit

## 2022-05-13 VITALS — BP 153/76 | HR 97 | Temp 98.0°F | Resp 18

## 2022-05-13 DIAGNOSIS — J209 Acute bronchitis, unspecified: Secondary | ICD-10-CM

## 2022-05-13 DIAGNOSIS — B372 Candidiasis of skin and nail: Secondary | ICD-10-CM | POA: Diagnosis not present

## 2022-05-13 MED ORDER — NYSTATIN 100000 UNIT/GM EX CREA: TOPICAL_CREAM | CUTANEOUS | 0 refills | Status: DC

## 2022-05-13 MED ORDER — ALBUTEROL SULFATE HFA 108 (90 BASE) MCG/ACT IN AERS: 2.0000 | INHALATION_SPRAY | RESPIRATORY_TRACT | 0 refills | Status: AC | PRN

## 2022-05-13 MED ORDER — PROMETHAZINE-DM 6.25-15 MG/5ML PO SYRP: 5.0000 mL | ORAL_SOLUTION | Freq: Four times a day (QID) | ORAL | 0 refills | Status: DC | PRN

## 2022-05-13 MED ORDER — FLUCONAZOLE 150 MG PO TABS: 150.0000 mg | ORAL_TABLET | ORAL | 0 refills | Status: DC

## 2022-05-13 NOTE — ED Provider Notes (Signed)
[facility]    CSN: [id number] Arrival date & time: 05/13/22  1807      History   Chief Complaint Chief Complaint  Patient presents with   Cough    HPI Marie Mills is a 52 y.o. female.   Patient presenting today with 5-day history of productive cough, nasal congestion, sinus pressure, scratchy throat.  Denies fever, chills, body aches, chest pain, shortness of breath.  So far taking Mucinex and Robitussin with minimal relief.  States she has a history of pneumonia so wanted to make sure this was not happening again.  She also has an itchy rash underneath her breast bilaterally for the past 3 days.  No new soaps or products, foods, medications.    Past Medical History:  Diagnosis Date   BV (bacterial vaginosis) 08/29/2013   Contraceptive management 03/21/2013   GERD (gastroesophageal reflux disease)    Hypertension    Obesity    Vaginal irritation 08/29/2013    Patient Active Problem List   Diagnosis Date Noted   Encounter for screening fecal occult blood testing 03/23/2022   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) 02/15/2022   Lumbar radiculopathy 02/15/2022   Spinal stenosis of lumbar region with neurogenic claudication 02/15/2022   Chronic pain syndrome 02/15/2022   Acute bilateral low back pain with bilateral sciatica 12/30/2021   Skin excoriation 12/14/2021   Superficial fungus infection of skin 12/06/2021   Vaginal yeast infection 12/06/2021   Vaginal itching 12/06/2021   Vaginal burning 12/06/2021   Visit for suture removal 12/02/2021   Burning with urination 11/16/2021   Pain and swelling of right knee 10/29/2021   Encounter for screening colonoscopy 06/29/2021   Screening examination for STD (sexually transmitted disease) 05/06/2021   Epidermal cyst 05/06/2021   Screening mammogram for breast cancer 02/03/2021   Routine general medical examination at a health care facility 02/03/2021   Encounter for gynecological examination with  Papanicolaou smear of cervix 02/03/2021   Encounter for surveillance of contraceptive pills 02/03/2021   Anxiety and depression 02/03/2021   Weight gain 07/04/2019   Body mass index 39.0-39.9, adult 05/28/2019   Body mass index 37.0-37.9, adult 04/08/2019   Body mass index 38.0-38.9, adult 02/04/2019   Body mass index 40.0-44.9, adult ([place]) 11/01/2018   Encounter for well woman exam with routine gynecological exam 10/08/2018   Weight loss counseling, encounter for 10/08/2018   Body mass index 45.0-49.9, adult ([place]) 10/08/2018   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 present 05/30/2016   Surveillance for birth control, oral contraceptives 05/30/2016   Family planning 05/30/2016   Screening for colorectal cancer 05/30/2016   Vaginal irritation 08/29/2013   BV (bacterial vaginosis) 08/29/2013   Essential hypertension 03/21/2013   Contraceptive management 03/21/2013    Past Surgical History:  Procedure Laterality Date   CHOLECYSTECTOMY     COLONOSCOPY WITH PROPOFOL N/A 07/30/2021   Procedure: COLONOSCOPY WITH PROPOFOL;  Surgeon: Eloise Harman, DO;  [facility];  Service: Endoscopy;  Laterality: N/A;  2:45pm   POLYPECTOMY  07/30/2021   Procedure: POLYPECTOMY;  Surgeon: Eloise Harman, DO;  Location: [facility];  Service: Endoscopy;;    OB History     Gravida  4   Para  3   Term  3   Preterm      AB  1   Living  3      SAB      IAB  1   Ectopic  Multiple      Live Births  3            Home Medications    Prior to Admission medications   Medication Sig Start Date End Date Taking? Authorizing Provider  albuterol (VENTOLIN HFA) 108 (90 Base) MCG/ACT inhaler Inhale 2 puffs into the lungs every 4 (four) hours as needed for wheezing or shortness of breath. 05/13/22  Yes Volney American, PA-C  fluconazole (DIFLUCAN) 150 MG tablet Take 1 tablet (150 mg total) by mouth every other day. 05/13/22  Yes Volney American, PA-C   nystatin cream (MYCOSTATIN) Apply to affected area 2 times daily 05/13/22  Yes Volney American, PA-C  predniSONE (DELTASONE) 10 MG tablet Take 10 mg by mouth daily with breakfast.   Yes [provider]  promethazine-dextromethorphan (PROMETHAZINE-DM) 6.25-15 MG/5ML syrup Take 5 mLs by mouth 4 (four) times daily as needed. 05/13/22  Yes Volney American, PA-C  acetaminophen (TYLENOL) 325 MG tablet Take 2 tablets (650 mg total) by mouth every 6 (six) hours as needed. 01/01/22   Jeanell Sparrow, DO  amLODipine (NORVASC) 2.5 MG tablet TAKE 1 TABLET(2.5 MG) BY MOUTH DAILY Patient taking differently: Take 2.5 mg by mouth daily. 10/18/21   Fay Records, MD  aspirin EC 81 MG tablet Take 81 mg by mouth daily. Swallow whole.    [provider]  azelastine (ASTELIN) 0.1 % nasal spray Place 2 sprays into both nostrils 2 (two) times daily. Use in each nostril as directed 08/09/21   Leath-Warren, Alda Lea, NP  budesonide (PULMICORT) 1 MG/2ML nebulizer solution Take 1 mg by nebulization daily. 09/08/21   [provider]  cetirizine (ZYRTEC) 10 MG tablet Take 1 tablet (10 mg total) by mouth daily. 08/09/21   Leath-Warren, Alda Lea, NP  fluticasone (FLONASE) 50 MCG/ACT nasal spray Place 1 spray into both nostrils daily.    [provider]  gabapentin (NEURONTIN) 300 MG capsule Take 1 capsule (300 mg total) by mouth 3 (three) times daily. 01/06/22   Carole Civil, MD  ibuprofen (ADVIL) 600 MG tablet Take 1 tablet (600 mg total) by mouth every 6 (six) hours as needed. 01/01/22   Wynona Dove A, DO  ipratropium (ATROVENT) 0.06 % nasal spray Place 2 sprays into both nostrils 4 (four) times daily.    [provider]  lidocaine (LIDODERM) 5 % Place 1 patch onto the skin daily as needed. Remove & Discard patch within 12 hours or as directed by MD 01/01/22   Wynona Dove A, DO  losartan (COZAAR) 50 MG tablet Take 50 mg by mouth daily. 09/08/21   [provider]  magnesium gluconate (MAGONATE) 500 MG tablet Take 500 mg by mouth 2 (two) times daily.    [provider]  metoprolol succinate (TOPROL-XL) 25 MG 24 hr tablet Take 25 mg by mouth daily. 04/23/19   [provider]  montelukast (SINGULAIR) 10 MG tablet Take 10 mg by mouth daily. 05/03/21   [provider]  norethindrone (MICRONOR) 0.35 MG tablet Take 1 tablet (0.35 mg total) by mouth daily. 03/23/22   Estill Dooms, NP  nystatin cream (MYCOSTATIN) Apply 1 Application topically 2 (two) times daily. Patient taking differently: Apply 1 Application topically 2 (two) times daily as needed for dry skin. 12/06/21   Estill Dooms, NP  omeprazole (PRILOSEC) 40 MG capsule TK 1 C PO QD Patient taking differently: Take 40 mg by mouth daily. 03/04/19   Estill Dooms,  NP  potassium chloride (KLOR-CON M) 10 MEQ tablet Take 10 mEq by mouth 2 (two) times daily.    [provider]  pyridOXINE (VITAMIN B6) 100 MG tablet Take 100 mg by mouth daily.    [provider]  sulindac (CLINORIL) 150 MG tablet TAKE 1 TABLET(150 MG) BY MOUTH TWICE DAILY 04/13/22   Carole Civil, MD  tiZANidine (ZANAFLEX) 4 MG tablet TAKE 1 TABLET(4 MG) BY MOUTH THREE TIMES DAILY FOR 20 DAYS 05/03/22   Carole Civil, MD  valACYclovir (VALTREX) 1000 MG tablet Take 1 tablet (1,000 mg total) by mouth 2 (two) times daily. 12/14/21   Estill Dooms, NP    Family History Family History  Problem Relation Age of Onset   Hypertension Mother    Hyperlipidemia Mother    Diabetes Mother    Hyperlipidemia Father    Hypertension Father    Diabetes Father    Seizures Father    Diabetes Sister    Hypertension Sister    Diabetes Sister    Hypertension Sister    Hypertension Sister    Diabetes Brother    Hypertension Brother    Asthma Daughter    Asthma Son    Cancer Maternal Aunt        breast   Colon cancer Neg Hx    Colon polyps Neg Hx     Social History Social  History   Tobacco Use   Smoking status: Never   Smokeless tobacco: Never  Vaping Use   Vaping Use: Never used  Substance Use Topics   Alcohol use: No   Drug use: No     Allergies   Penicillins, Codeine, and Sulfa antibiotics   Review of Systems Review of Systems Per HPI  Physical Exam Triage Vital Signs ED Triage Vitals  Enc Vitals Group     BP 05/13/22 1814 (!) 153/76     Pulse Rate 05/13/22 1814 97     Resp 05/13/22 1814 18     Temp 05/13/22 1814 98 F (36.7 C)     Temp Source 05/13/22 1814 Oral     SpO2 05/13/22 1814 94 %     Weight --      Height --      Head Circumference --      Peak Flow --      Pain Score 05/13/22 1815 7     Pain Loc --      Pain Edu? --      Excl. in [place]? --    No data found.  Updated Vital Signs BP (!) 153/76 (BP Location: Right Arm)   Pulse 97   Temp 98 F (36.7 C) (Oral)   Resp 18   LMP 04/14/2022   SpO2 94%   Visual Acuity Right Eye Distance:   Left Eye Distance:   Bilateral Distance:    Right Eye Near:   Left Eye Near:    Bilateral Near:     Physical Exam Vitals and nursing note reviewed.  Constitutional:      Appearance: Normal appearance.  HENT:     Head: Atraumatic.     Right Ear: Tympanic membrane and external ear normal.     Left Ear: Tympanic membrane and external ear normal.     Nose: Rhinorrhea present.     Mouth/Throat:     Mouth: Mucous membranes are moist.     Pharynx: Posterior oropharyngeal erythema present.  Eyes:     Extraocular Movements: Extraocular movements intact.  Conjunctiva/sclera: Conjunctivae normal.  Cardiovascular:     Rate and Rhythm: Normal rate and regular rhythm.     Heart sounds: Normal heart sounds.  Pulmonary:     Effort: Pulmonary effort is normal.     Breath sounds: Normal breath sounds. No wheezing or rales.  Musculoskeletal:        General: Normal range of motion.     Cervical back: Normal range of motion and neck supple.  Skin:    General: Skin is warm and dry.   Neurological:     Mental Status: She is alert and oriented to person, place, and time.     Motor: No weakness.     Gait: Gait normal.  Psychiatric:        Mood and Affect: Mood normal.        Thought Content: Thought content normal.      UC Treatments / Results  Labs (all labs ordered are listed, but only abnormal results are displayed) Labs Reviewed - No data to display  EKG   Radiology No results found.  Procedures Procedures (including critical care time)  Medications Ordered in UC Medications - No data to display  Initial Impression / Assessment and Plan / UC Course  I have reviewed the triage vital signs and the nursing notes.  Pertinent labs & imaging results that were available during my care of the patient were reviewed by me and considered in my medical decision making (see chart for details).     Suspect viral upper respiratory infection leading to some bronchitis.  She states she is already taking prednisone for some back issues, will add albuterol inhaler, Phenergan DM and continue Mucinex.  Regarding her rash under her breasts, consistent with yeast rash so we will treat with nystatin, Diflucan.  Return for worsening symptoms. Final Clinical Impressions(s) / UC Diagnoses   Final diagnoses:  Cutaneous candidiasis  Acute bronchitis, unspecified organism   Discharge Instructions   None    ED Prescriptions     Medication Sig Dispense Auth. Provider   albuterol (VENTOLIN HFA) 108 (90 Base) MCG/ACT inhaler Inhale 2 puffs into the lungs every 4 (four) hours as needed for wheezing or shortness of breath. [place], [place]   promethazine-dextromethorphan (PROMETHAZINE-DM) 6.25-15 MG/5ML syrup Take 5 mLs by mouth 4 (four) times daily as needed. 100 mL Volney American, PA-C   nystatin cream (MYCOSTATIN) Apply to affected area 2 times daily 80 g Volney American, PA-C   fluconazole (DIFLUCAN) 150 MG tablet Take 1 tablet (150 mg  total) by mouth every other day. 3 tablet Volney American, [place]      PDMP not reviewed this encounter.   Volney American, [place] 05/13/22 1925

## 2022-05-13 NOTE — ED Triage Notes (Signed)
Pt reports she's been coughing with green flem, chest pain due to coughing , and sinus pressure x 5 days    Pt reports a rash underneath her stomach and breast area x 3 days

## 2022-05-16 ENCOUNTER — Telehealth: Payer: Self-pay | Admitting: Orthopedic Surgery

## 2022-05-16 NOTE — Telephone Encounter (Signed)
Patient called, stated that she is scheduled to have surgery and that she got a letter in the mail on Saturday stating her surgery is denied.  She would like Amy to call her back.  Pt's # [phone]

## 2022-05-17 ENCOUNTER — Ambulatory Visit: Payer: Medicaid Other | Admitting: Neurosurgery

## 2022-05-17 ENCOUNTER — Encounter: Payer: Self-pay | Admitting: Neurosurgery

## 2022-05-17 VITALS — BP 140/82 | Ht 66.0 in | Wt 273.2 lb

## 2022-05-17 DIAGNOSIS — M5416 Radiculopathy, lumbar region: Secondary | ICD-10-CM

## 2022-05-17 NOTE — Progress Notes (Signed)
  Referring Physician:  Hasanaj, Xaje A, MD [address] [place],  [place] [address]  Primary Physician:  Hasanaj, Xaje A, MD  History of Present Illness: 05/17/2022 [name] returns to see me.  She continues to have symptoms as noted below.  She has tried an injection without significant improvement.  She tried physical therapy without improvement.  01/11/2022 [name] is here today with a chief complaint of left sided low back pain with radiation into the left buttock and down the leg along with constant and numbness and tingling down the leg.  Been having pain for 3 years but has worsened over the past 3 to 4 weeks.  She describes pain in the left side of her lower back extending to the lateral side of her leg and anterior and lateral aspect of her calf.  Walking, standing, bending, and lifting make it worse.  She can only walk about 10 feet before she has onset of pain.  Pain can be as bad as 10 out of 10.  Sitting down makes it better but does not completely eliminate her pain.  She denies weakness.   Bowel/Bladder Dysfunction: none  Conservative measures:  Physical therapy: has not participated in Multimodal medical therapy including regular antiinflammatories: hydromorphone, ibuprofen, gabapentin, methocarbamol, tizanidine Injections: has not had any epidural steroid injections  Past Surgery: denies  [name] has no symptoms of cervical myelopathy.  The symptoms are causing a significant impact on the patient's life.   Review of Systems:  A 10 point review of systems is negative, except for the pertinent positives and negatives detailed in the HPI.  Past Medical History: Past Medical History:  Diagnosis Date   BV (bacterial vaginosis) 08/29/2013   Contraceptive management 03/21/2013   GERD (gastroesophageal reflux disease)    Hypertension    Obesity    Vaginal irritation 08/29/2013    Past Surgical History: Past Surgical History:  Procedure  Laterality Date   CHOLECYSTECTOMY     COLONOSCOPY WITH PROPOFOL N/A 07/30/2021   Procedure: COLONOSCOPY WITH PROPOFOL;  Surgeon: Carver, Charles K, DO;  [facility];  Service: Endoscopy;  Laterality: N/A;  2:45pm   POLYPECTOMY  07/30/2021   Procedure: POLYPECTOMY;  Surgeon: Carver, Charles K, DO;  [facility];  Service: Endoscopy;;    Allergies: Allergies as of 05/17/2022 - Review Complete 05/17/2022  Allergen Reaction Noted   Penicillins Anaphylaxis 05/07/2011   Codeine Other (See Comments) 05/07/2011   Sulfa antibiotics Hives 08/29/2013    Medications: Current Meds  Medication Sig   acetaminophen (TYLENOL) 325 MG tablet Take 2 tablets (650 mg total) by mouth every 6 (six) hours as needed.   albuterol (VENTOLIN HFA) 108 (90 Base) MCG/ACT inhaler Inhale 2 puffs into the lungs every 4 (four) hours as needed for wheezing or shortness of breath.   amLODipine (NORVASC) 2.5 MG tablet TAKE 1 TABLET(2.5 MG) BY MOUTH DAILY (Patient taking differently: Take 2.5 mg by mouth daily.)   aspirin EC 81 MG tablet Take 81 mg by mouth daily. Swallow whole.   azelastine (ASTELIN) 0.1 % nasal spray Place 2 sprays into both nostrils 2 (two) times daily. Use in each nostril as directed   budesonide (PULMICORT) 1 MG/2ML nebulizer solution Take 1 mg by nebulization daily.   cetirizine (ZYRTEC) 10 MG tablet Take 1 tablet (10 mg total) by mouth daily.   fluconazole (DIFLUCAN) 150 MG tablet Take 1 tablet (150 mg total) by mouth every other day.   fluticasone (  FLONASE) 50 MCG/ACT nasal spray Place 1 spray into both nostrils daily.   gabapentin (NEURONTIN) 300 MG capsule Take 1 capsule (300 mg total) by mouth 3 (three) times daily.   ibuprofen (ADVIL) 600 MG tablet Take 1 tablet (600 mg total) by mouth every 6 (six) hours as needed.   ipratropium (ATROVENT) 0.06 % nasal spray Place 2 sprays into both nostrils 4 (four) times daily.   lidocaine (LIDODERM) 5 % Place 1 patch onto the skin daily  as needed. Remove & Discard patch within 12 hours or as directed by MD   losartan (COZAAR) 50 MG tablet Take 50 mg by mouth daily.   magnesium gluconate (MAGONATE) 500 MG tablet Take 500 mg by mouth 2 (two) times daily.   metoprolol succinate (TOPROL-XL) 25 MG 24 hr tablet Take 25 mg by mouth daily.   montelukast (SINGULAIR) 10 MG tablet Take 10 mg by mouth daily.   norethindrone (MICRONOR) 0.35 MG tablet Take 1 tablet (0.35 mg total) by mouth daily.   nystatin cream (MYCOSTATIN) Apply 1 Application topically 2 (two) times daily. (Patient taking differently: Apply 1 Application topically 2 (two) times daily as needed for dry skin.)   nystatin cream (MYCOSTATIN) Apply to affected area 2 times daily   omeprazole (PRILOSEC) 40 MG capsule TK 1 C PO QD (Patient taking differently: Take 40 mg by mouth daily.)   potassium chloride (KLOR-CON M) 10 MEQ tablet Take 10 mEq by mouth 2 (two) times daily.   predniSONE (DELTASONE) 10 MG tablet Take 10 mg by mouth daily with breakfast.   promethazine-dextromethorphan (PROMETHAZINE-DM) 6.25-15 MG/5ML syrup Take 5 mLs by mouth 4 (four) times daily as needed.   pyridOXINE (VITAMIN B6) 100 MG tablet Take 100 mg by mouth daily.   sulindac (CLINORIL) 150 MG tablet TAKE 1 TABLET(150 MG) BY MOUTH TWICE DAILY   tiZANidine (ZANAFLEX) 4 MG tablet TAKE 1 TABLET(4 MG) BY MOUTH THREE TIMES DAILY FOR 20 DAYS   valACYclovir (VALTREX) 1000 MG tablet Take 1 tablet (1,000 mg total) by mouth 2 (two) times daily.    Social History: Social History   Tobacco Use   Smoking status: Never   Smokeless tobacco: Never  Vaping Use   Vaping Use: Never used  Substance Use Topics   Alcohol use: No   Drug use: No    Family Medical History: Family History  Problem Relation Age of Onset   Hypertension Mother    Hyperlipidemia Mother    Diabetes Mother    Hyperlipidemia Father    Hypertension Father    Diabetes Father    Seizures Father    Diabetes Sister    Hypertension  Sister    Diabetes Sister    Hypertension Sister    Hypertension Sister    Diabetes Brother    Hypertension Brother    Asthma Daughter    Asthma Son    Cancer Maternal Aunt        breast   Colon cancer Neg Hx    Colon polyps Neg Hx     Physical Examination: Vitals:   05/17/22 1515  BP: (!) 140/82    General: Patient is well developed, well nourished, calm, collected, and in no apparent distress. Attention to examination is appropriate.  Neck:   Supple.  Full range of motion.  Respiratory: Patient is breathing without any difficulty.   NEUROLOGICAL:     Awake, alert, oriented to person, place, and time.  Speech is clear and fluent. Fund of knowledge is appropriate.     Cranial Nerves: Pupils equal round and reactive to light.  Facial tone is symmetric.  Facial sensation is symmetric. Shoulder shrug is symmetric. Tongue protrusion is midline.  There is no pronator drift.   Strength: Side Biceps Triceps Deltoid Interossei Grip Wrist Ext. Wrist Flex.  R 5 5 5 5 5 5 5  L 5 5 5 5 5 5 5   Side Iliopsoas Quads Hamstring PF DF EHL  R 5 5 5 5 5 5  L 5 5 5 5 5 5   Reflexes are 1+ and symmetric at the biceps, triceps, brachioradialis, patella and achilles.   Hoffman's is absent.  Clonus is not present.  Toes are down-going.  Bilateral upper and lower extremity sensation is intact to light touch.    No evidence of dysmetria noted.  Gait is antalgic  Medical Decision Making  Imaging: CT L spine 01/01/22 IMPRESSION: 1. No acute fracture or traumatic subluxation. 2. No suspicious osseous lesion. 3. Degenerate disc disease prominent at L4-L5 and L5-S1 with narrowing of lateral recesses bilaterally. Mild bilateral neural foraminal stenosis and facet joint arthropathy.   Electronically Signed: By: Imran  Ahmed D.O. On: 01/01/2022 17:30  MRI L spine 02/03/2022 IMPRESSION: 1. Large left subarticular disc extrusion at L3-L4 with inferior migration to the infrapedicle level  with marked mass effect on the left-sided nerve roots in the lateral recess and severe spinal canal stenosis. 2. Mild spinal canal and left neural foraminal stenosis at L4-L5.     Electronically Signed   By: Kevin  Herman M.D.   On: 02/05/2022 13:09 I have personally reviewed the images and agree with the above interpretation.  Assessment and Plan: Ms. Kerstein is a pleasant 51 y.o. female with chronic low back pain with more acute pain down her left leg concerning for left L4 radiculopathy.  She has tried and failed physical therapy and an injection.  There is no role for continued conservative management as she has now failed more than 6 weeks of conservative management.  I recommended left-sided L3-4 microdiscectomy.   I discussed the planned procedure at length with the patient, including the risks, benefits, alternatives, and indications. The risks discussed include but are not limited to bleeding, infection, need for reoperation, spinal fluid leak, stroke, vision loss, anesthetic complication, coma, paralysis, and even death. I also described in detail that improvement was not guaranteed.  The patient expressed understanding of these risks, and asked that we proceed with surgery. I described the surgery in layman's terms, and gave ample opportunity for questions, which were answered to the best of my ability.  I spent a total of 30 minutes in face-to-face and non-face-to-face activities related to this patient's care today.  Thank you for involving me in the care of this patient.      Alexandera Kuntzman K. Jahlil Ziller MD, MPHS Neurosurgery 

## 2022-05-17 NOTE — H&P (View-Only) (Signed)
Referring Physician:  Neale Burly, MD [place],  [place] [id number]  Primary Physician:  Neale Burly, MD  History of Present Illness: 05/17/2022 Marie Mills returns to see me.  She continues to have symptoms as noted below.  She has tried an injection without significant improvement.  She tried physical therapy without improvement.  01/11/2022 Marie Mills is here today with a chief complaint of left sided low back pain with radiation into the left buttock and down the leg along with constant and numbness and tingling down the leg.  Been having pain for 3 years but has worsened over the past 3 to 4 weeks.  She describes pain in the left side of her lower back extending to the lateral side of her leg and anterior and lateral aspect of her calf.  Walking, standing, bending, and lifting make it worse.  She can only walk about 10 feet before she has onset of pain.  Pain can be as bad as 10 out of 10.  Sitting down makes it better but does not completely eliminate her pain.  She denies weakness.   Bowel/Bladder Dysfunction: none  Conservative measures:  Physical therapy: has not participated in Multimodal medical therapy including regular antiinflammatories: hydromorphone, ibuprofen, gabapentin, methocarbamol, tizanidine Injections: has not had any epidural steroid injections  Past Surgery: denies  Marie Mills has no symptoms of cervical myelopathy.  The symptoms are causing a significant impact on the patient's life.   Review of Systems:  A 10 point review of systems is negative, except for the pertinent positives and negatives detailed in the HPI.  Past Medical History: Past Medical History:  Diagnosis Date   BV (bacterial vaginosis) 08/29/2013   Contraceptive management 03/21/2013   GERD (gastroesophageal reflux disease)    Hypertension    Obesity    Vaginal irritation 08/29/2013    Past Surgical History: Past Surgical History:  Procedure  Laterality Date   CHOLECYSTECTOMY     COLONOSCOPY WITH PROPOFOL N/A 07/30/2021   Procedure: COLONOSCOPY WITH PROPOFOL;  Surgeon: Eloise Harman, DO;  [facility];  Service: Endoscopy;  Laterality: N/A;  2:45pm   POLYPECTOMY  07/30/2021   Procedure: POLYPECTOMY;  Surgeon: Eloise Harman, DO;  Location: [facility];  Service: Endoscopy;;    Allergies: Allergies as of 05/17/2022 - Review Complete 05/17/2022  Allergen Reaction Noted   Penicillins Anaphylaxis 05/07/2011   Codeine Other (See Comments) 05/07/2011   Sulfa antibiotics Hives 08/29/2013    Medications: Current Meds  Medication Sig   acetaminophen (TYLENOL) 325 MG tablet Take 2 tablets (650 mg total) by mouth every 6 (six) hours as needed.   albuterol (VENTOLIN HFA) 108 (90 Base) MCG/ACT inhaler Inhale 2 puffs into the lungs every 4 (four) hours as needed for wheezing or shortness of breath.   amLODipine (NORVASC) 2.5 MG tablet TAKE 1 TABLET(2.5 MG) BY MOUTH DAILY (Patient taking differently: Take 2.5 mg by mouth daily.)   aspirin EC 81 MG tablet Take 81 mg by mouth daily. Swallow whole.   azelastine (ASTELIN) 0.1 % nasal spray Place 2 sprays into both nostrils 2 (two) times daily. Use in each nostril as directed   budesonide (PULMICORT) 1 MG/2ML nebulizer solution Take 1 mg by nebulization daily.   cetirizine (ZYRTEC) 10 MG tablet Take 1 tablet (10 mg total) by mouth daily.   fluconazole (DIFLUCAN) 150 MG tablet Take 1 tablet (150 mg total) by mouth every other day.   fluticasone (  FLONASE) 50 MCG/ACT nasal spray Place 1 spray into both nostrils daily.   gabapentin (NEURONTIN) 300 MG capsule Take 1 capsule (300 mg total) by mouth 3 (three) times daily.   ibuprofen (ADVIL) 600 MG tablet Take 1 tablet (600 mg total) by mouth every 6 (six) hours as needed.   ipratropium (ATROVENT) 0.06 % nasal spray Place 2 sprays into both nostrils 4 (four) times daily.   lidocaine (LIDODERM) 5 % Place 1 patch onto the skin daily  as needed. Remove & Discard patch within 12 hours or as directed by MD   losartan (COZAAR) 50 MG tablet Take 50 mg by mouth daily.   magnesium gluconate (MAGONATE) 500 MG tablet Take 500 mg by mouth 2 (two) times daily.   metoprolol succinate (TOPROL-XL) 25 MG 24 hr tablet Take 25 mg by mouth daily.   montelukast (SINGULAIR) 10 MG tablet Take 10 mg by mouth daily.   norethindrone (MICRONOR) 0.35 MG tablet Take 1 tablet (0.35 mg total) by mouth daily.   nystatin cream (MYCOSTATIN) Apply 1 Application topically 2 (two) times daily. (Patient taking differently: Apply 1 Application topically 2 (two) times daily as needed for dry skin.)   nystatin cream (MYCOSTATIN) Apply to affected area 2 times daily   omeprazole (PRILOSEC) 40 MG capsule TK 1 C PO QD (Patient taking differently: Take 40 mg by mouth daily.)   potassium chloride (KLOR-CON M) 10 MEQ tablet Take 10 mEq by mouth 2 (two) times daily.   predniSONE (DELTASONE) 10 MG tablet Take 10 mg by mouth daily with breakfast.   promethazine-dextromethorphan (PROMETHAZINE-DM) 6.25-15 MG/5ML syrup Take 5 mLs by mouth 4 (four) times daily as needed.   pyridOXINE (VITAMIN B6) 100 MG tablet Take 100 mg by mouth daily.   sulindac (CLINORIL) 150 MG tablet TAKE 1 TABLET(150 MG) BY MOUTH TWICE DAILY   tiZANidine (ZANAFLEX) 4 MG tablet TAKE 1 TABLET(4 MG) BY MOUTH THREE TIMES DAILY FOR 20 DAYS   valACYclovir (VALTREX) 1000 MG tablet Take 1 tablet (1,000 mg total) by mouth 2 (two) times daily.    Social History: Social History   Tobacco Use   Smoking status: Never   Smokeless tobacco: Never  Vaping Use   Vaping Use: Never used  Substance Use Topics   Alcohol use: No   Drug use: No    Family Medical History: Family History  Problem Relation Age of Onset   Hypertension Mother    Hyperlipidemia Mother    Diabetes Mother    Hyperlipidemia Father    Hypertension Father    Diabetes Father    Seizures Father    Diabetes Sister    Hypertension  Sister    Diabetes Sister    Hypertension Sister    Hypertension Sister    Diabetes Brother    Hypertension Brother    Asthma Daughter    Asthma Son    Cancer Maternal Aunt        breast   Colon cancer Neg Hx    Colon polyps Neg Hx     Physical Examination: Vitals:   05/17/22 1515  BP: (!) 140/82    General: Patient is well developed, well nourished, calm, collected, and in no apparent distress. Attention to examination is appropriate.  Neck:   Supple.  Full range of motion.  Respiratory: Patient is breathing without any difficulty.   NEUROLOGICAL:     Awake, alert, oriented to person, place, and time.  Speech is clear and fluent. Fund of knowledge is appropriate.  Cranial Nerves: Pupils equal round and reactive to light.  Facial tone is symmetric.  Facial sensation is symmetric. Shoulder shrug is symmetric. Tongue protrusion is midline.  There is no pronator drift.   Strength: Side Biceps Triceps Deltoid Interossei Grip Wrist Ext. Wrist Flex.  R '5 5 5 5 5 5 5  '$ L '5 5 5 5 5 5 5   '$ Side Iliopsoas Quads Hamstring PF DF EHL  R '5 5 5 5 5 5  '$ L '5 5 5 5 5 5   '$ Reflexes are 1+ and symmetric at the biceps, triceps, brachioradialis, patella and achilles.   Hoffman's is absent.  Clonus is not present.  Toes are down-going.  Bilateral upper and lower extremity sensation is intact to light touch.    No evidence of dysmetria noted.  Gait is antalgic  Medical Decision Making  Imaging: CT L spine 01/01/22 IMPRESSION: 1. No acute fracture or traumatic subluxation. 2. No suspicious osseous lesion. 3. Degenerate disc disease prominent at L4-L5 and L5-S1 with narrowing of lateral recesses bilaterally. Mild bilateral neural foraminal stenosis and facet joint arthropathy.   Electronically Signed: By: Keane Police D.O. On: 01/01/2022 17:30  MRI L spine 02/03/2022 IMPRESSION: 1. Large left subarticular disc extrusion at L3-L4 with inferior migration to the infrapedicle level  with marked mass effect on the left-sided nerve roots in the lateral recess and severe spinal canal stenosis. 2. Mild spinal canal and left neural foraminal stenosis at L4-L5.     Electronically Signed   By: Ulyses Jarred M.D.   On: 02/05/2022 13:09 I have personally reviewed the images and agree with the above interpretation.  Assessment and Plan: Marie Mills is a pleasant 52 y.o. female with chronic low back pain with more acute pain down her left leg concerning for left L4 radiculopathy.  She has tried and failed physical therapy and an injection.  There is no role for continued conservative management as she has now failed more than 6 weeks of conservative management.  I recommended left-sided L3-4 microdiscectomy.   I discussed the planned procedure at length with the patient, including the risks, benefits, alternatives, and indications. The risks discussed include but are not limited to bleeding, infection, need for reoperation, spinal fluid leak, stroke, vision loss, anesthetic complication, coma, paralysis, and even death. I also described in detail that improvement was not guaranteed.  The patient expressed understanding of these risks, and asked that we proceed with surgery. I described the surgery in layman's terms, and gave ample opportunity for questions, which were answered to the best of my ability.  I spent a total of 30 minutes in face-to-face and non-face-to-face activities related to this patient's care today.  Thank you for involving me in the care of this patient.      Tattianna Schnarr K. Izora Ribas MD, [facility]

## 2022-05-17 NOTE — Patient Instructions (Signed)
Please see below for information in regards to your upcoming surgery:  Planned surgery: Left L3-4 microdiscectomy  Back surgery and knee surgery should be spaced 6 weeks apart   Surgery date: 06/15/22 - you will find out your arrival time the business day before your surgery.   Pre-op appointment at [facility]: we will call you with a date/time for this. Pre-admit testing is located on the first floor of the [facility], [place], [address]. Please bring all prescriptions in the original prescription bottles to your appointment, even if you have reviewed medications by phone with a pharmacy representative. Pre-op labs may be done at your pre-op appointment. You are not required to fast for these labs. Should you need to change your pre-op appointment, please call Pre-admit testing at [phone].    Surgical clearance: we will send a clearance request to Dr Sherrie Sport    Blood thinners: stop aspirin 7 days prior, resume aspirin 14 days after    If you have FMLA/disability paperwork, please drop it off or fax it to [phone], attention [place].   We can be reached by phone or mychart 8am-4pm, Monday-Friday. If you have any questions/concerns before or after surgery, you can reach [place] at [phone], or you can send a mychart message. If you have a concern after hours that cannot wait until normal business hours, you can call [phone] and ask to page the neurosurgeon on call for [facility].    Appointments/FMLA & disability paperwork: [place] Nurse: Ophelia Shoulder  Medical assistant: Raquel Sarna Physician Assistant's: Cooper Render & Geronimo Boot Surgeon: Meade Maw, MD

## 2022-05-17 NOTE — Telephone Encounter (Signed)
Will look into this. Tomorrow afternoon I should have time

## 2022-05-18 ENCOUNTER — Telehealth: Payer: Self-pay

## 2022-05-18 ENCOUNTER — Telehealth: Payer: Self-pay | Admitting: Student in an Organized Health Care Education/Training Program

## 2022-05-18 ENCOUNTER — Other Ambulatory Visit: Payer: Self-pay

## 2022-05-18 DIAGNOSIS — Z01818 Encounter for other preprocedural examination: Secondary | ICD-10-CM

## 2022-05-18 NOTE — Telephone Encounter (Signed)
PT stated that she had appt with Dr. Izora Ribas on yesterday and decided that she needs to have the surgery done, So patient wanted to make Dr. Holley Raring aware. Thanks

## 2022-05-18 NOTE — Telephone Encounter (Signed)
Left message to return call to discuss what she does for work so we can determine how long we need to keep her out after surgery.  We have not been able to get her clearance form to go through via fax. We have placed a copy in the mail to Dr Sherrie Sport. However, if the pt happens to be in the area, we can give her a copy of it to take to Dr Sherrie Sport.

## 2022-05-18 NOTE — Telephone Encounter (Signed)
-----  Message from Peggyann Shoals sent at 05/18/2022  9:31 AM EST ----- Regarding: work note She was seen yesterday and forgot to ask for a work note. Can you document that she is to be out of work until surgery and include the time she will be out following surgery. She requested the letter to be mailed to her.

## 2022-05-18 NOTE — Telephone Encounter (Signed)
We had to give additional information should have determination soon I called her to advise

## 2022-05-18 NOTE — Telephone Encounter (Signed)
She says she will have a microdiscectomy by Dr Cari Caraway soon, and will cancel the knee surgery for now  To you FYI  She hopes to RS surgery to end of March, will call me mid March and we will reschedule.

## 2022-05-18 NOTE — Telephone Encounter (Signed)
Copy of letter and clearance form placed in mail for patient

## 2022-05-18 NOTE — Telephone Encounter (Signed)
I spoke with Ms Hanback. She is a [employer] manager. I explained the post-op restrictions and that she will not be able to return to work sooner unless she can return on light duty. At this time, we will plan to keep her out for 3 months after surgery.  In regards to the clearance form, I will mail her a copy of the form and she will try to get it filled out and drop it off when she comes for her pre-op appointment if it is completed by then unless we receive it via fax prior to then.

## 2022-05-23 ENCOUNTER — Ambulatory Visit: Payer: Medicaid Other | Admitting: Student in an Organized Health Care Education/Training Program

## 2022-05-24 ENCOUNTER — Ambulatory Visit: Payer: Medicaid Other | Admitting: Adult Health

## 2022-05-27 ENCOUNTER — Other Ambulatory Visit (HOSPITAL_COMMUNITY): Payer: Medicaid Other

## 2022-05-27 ENCOUNTER — Other Ambulatory Visit: Payer: Self-pay | Admitting: Family Medicine

## 2022-05-27 NOTE — Telephone Encounter (Signed)
Urgent Care Patient Requested Prescriptions  Pending Prescriptions Disp Refills   VENTOLIN HFA 108 (90 Base) MCG/ACT inhaler [Pharmacy Med Name: VENTOLIN HFA INH W/DOS CTR 200PUFFS] 18 g 0    Sig: INHALE 2 PUFFS INTO THE LUNGS EVERY 4 HOURS AS NEEDED FOR WHEEZING OR SHORTNESS OF BREATH     There is no refill protocol information for this order

## 2022-05-29 ENCOUNTER — Ambulatory Visit: Payer: Self-pay

## 2022-05-30 ENCOUNTER — Ambulatory Visit: Payer: Medicaid Other | Admitting: Adult Health

## 2022-05-30 ENCOUNTER — Encounter: Payer: Self-pay | Admitting: Adult Health

## 2022-05-30 VITALS — BP 180/98 | HR 84 | Ht 66.0 in | Wt 276.5 lb

## 2022-05-30 DIAGNOSIS — I10 Essential (primary) hypertension: Secondary | ICD-10-CM

## 2022-05-30 DIAGNOSIS — B369 Superficial mycosis, unspecified: Secondary | ICD-10-CM

## 2022-05-30 MED ORDER — CLOTRIMAZOLE-BETAMETHASONE 1-0.05 % EX CREA: 1.0000 | TOPICAL_CREAM | Freq: Two times a day (BID) | CUTANEOUS | 1 refills | Status: DC

## 2022-05-30 NOTE — Progress Notes (Signed)
  Subjective:     Patient ID: Marie Mills, female   DOB: 1970-12-15, 52 y.o.   MRN: [id number]  HPI Marie Mills is a 52 year old white female, divorced, [id number], in for follow up on taking Micronor and BP check, stopped Micronor did not feel right on it. She has had steroid injection recently. Has ?skin fungus. She is having back surgery 06/15/22.  Last pap was negative HPV and NILM 02/03/21.   PCP is Dr Sherrie Sport  Review of Systems ?skin fungus   Reviewed past medical,surgical, social and family history. Reviewed medications and allergies.  Objective:   Physical Exam BP (!) 180/98 (BP Location: Right Arm, Patient Position: Sitting, Cuff Size: Normal)   Pulse 84   Ht '5\' 6"'$  (1.676 m)   Wt 276 lb 8 oz (125.4 kg)   LMP 05/12/2022   BMI 44.63 kg/m     Skin warm and dry.  Lungs: clear to ausculation bilaterally. Cardiovascular: regular rate and rhythm. Has skin fungus unde breasts.  Fall risk is low  [facility] - 05/30/22 1617       Pregnancy Intention Screening   Does the patient want to become pregnant in the next year? No    Does the patient's partner want to become pregnant in the next year? No    Would the patient like to discuss contraceptive options today? No      Contraception Wrap Up   Current Method Abstinence    End Method Abstinence              Assessment:     1. Superficial fungus infection of skin Will rx lotrisone Meds ordered this encounter  Medications   clotrimazole-betamethasone (LOTRISONE) cream    Sig: Apply 1 Application topically 2 (two) times daily.    Dispense:  30 g    Refill:  1    Order Specific Question:   Supervising Provider    Answer:   Elonda Husky, LUTHER H [[id number]]     2. Essential hypertension Take meds and follow up with Dr Sherrie Sport     Plan:     Follow up prn

## 2022-05-31 ENCOUNTER — Ambulatory Visit: Admit: 2022-05-31 | Payer: Medicaid Other | Admitting: Orthopedic Surgery

## 2022-05-31 SURGERY — ARTHROSCOPY, KNEE, WITH MEDIAL MENISCECTOMY
Anesthesia: Choice | Site: Knee | Laterality: Right

## 2022-06-02 ENCOUNTER — Encounter
Admission: RE | Admit: 2022-06-02 | Discharge: 2022-06-02 | Disposition: A | Discharge status: Home / Self Care | Payer: Medicaid Other | Source: Ambulatory Visit | Attending: Neurosurgery | Admitting: Neurosurgery

## 2022-06-02 ENCOUNTER — Encounter: Payer: Self-pay | Admitting: Neurosurgery

## 2022-06-02 VITALS — BP 140/83 | HR 92 | Temp 97.8°F | Resp 17 | Ht 66.0 in | Wt 279.0 lb

## 2022-06-02 DIAGNOSIS — Z01812 Encounter for preprocedural laboratory examination: Secondary | ICD-10-CM

## 2022-06-02 DIAGNOSIS — Z01818 Encounter for other preprocedural examination: Secondary | ICD-10-CM | POA: Insufficient documentation

## 2022-06-02 DIAGNOSIS — Z22322 Carrier or suspected carrier of Methicillin resistant Staphylococcus aureus: Secondary | ICD-10-CM

## 2022-06-02 DIAGNOSIS — I10 Essential (primary) hypertension: Secondary | ICD-10-CM | POA: Insufficient documentation

## 2022-06-02 HISTORY — DX: Unspecified convulsions: R56.9

## 2022-06-02 HISTORY — DX: Pneumonia, unspecified organism: J18.9

## 2022-06-02 HISTORY — DX: Unspecified asthma, uncomplicated: J45.909

## 2022-06-02 HISTORY — DX: Radiculopathy, lumbar region: M54.16

## 2022-06-02 HISTORY — DX: Carrier or suspected carrier of methicillin resistant Staphylococcus aureus: Z22.322

## 2022-06-02 LAB — URINALYSIS, ROUTINE W REFLEX MICROSCOPIC
Bilirubin Urine: NEGATIVE
Glucose, UA: NEGATIVE mg/dL
Hgb urine dipstick: NEGATIVE
Ketones, ur: NEGATIVE mg/dL
Leukocytes,Ua: NEGATIVE
Nitrite: NEGATIVE
Protein, ur: NEGATIVE mg/dL
Specific Gravity, Urine: 1.027 (ref 1.005–1.030)
pH: 5 (ref 5.0–8.0)

## 2022-06-02 LAB — BASIC METABOLIC PANEL
Anion gap: 8 (ref 5–15)
BUN: 19 mg/dL (ref 6–20)
CO2: 22 mmol/L (ref 22–32)
Calcium: 8.4 mg/dL — ABNORMAL LOW (ref 8.9–10.3)
Chloride: 108 mmol/L (ref 98–111)
Creatinine, Ser: 0.66 mg/dL (ref 0.44–1.00)
GFR, Estimated: >60 mL/min (ref >60)
Glucose, Bld: 83 mg/dL (ref 70–99)
Potassium: 3.2 mmol/L — ABNORMAL LOW (ref 3.5–5.1)
Sodium: 138 mmol/L (ref 135–145)

## 2022-06-02 LAB — SURGICAL PCR SCREEN
MRSA, PCR: POSITIVE — AB
Staphylococcus aureus: POSITIVE — AB

## 2022-06-02 LAB — TYPE AND SCREEN
ABO/RH(D): O POS
Antibody Screen: NEGATIVE

## 2022-06-02 NOTE — Patient Instructions (Addendum)
Your procedure is scheduled on: Wednesday, February 28 Report to the Registration Desk on the 1st floor of the [facility]. To find out your arrival time, please call [phone] between 1PM - 3PM on: Tuesday, February 27 If your arrival time is 6:00 am, do not arrive before that time as the [facility] entrance doors do not open until 6:00 am.  REMEMBER: Instructions that are not followed completely may result in serious medical risk, up to and including death; or upon the discretion of your surgeon and anesthesiologist your surgery may need to be rescheduled.  Do not eat food after midnight the night before surgery.  No gum chewing or hard candies.  You may however, drink CLEAR liquids up to 2 hours before you are scheduled to arrive for your surgery. Do not drink anything within 2 hours of your scheduled arrival time.  Clear liquids include: - water  - apple juice without pulp - gatorade (not RED colors) - black coffee or tea (Do NOT add milk or creamers to the coffee or tea) Do NOT drink anything that is not on this list.  One week prior to surgery: starting February 21 Stop sulindac (clinoril) and Anti-inflammatories (NSAIDS) such as Advil, Aleve, Ibuprofen, Motrin, Naproxen, Naprosyn and Aspirin based products such as Excedrin, Goody's Powder, BC Powder. Stop ANY OVER THE COUNTER supplements until after surgery. You may however, continue to take Tylenol if needed for pain up until the day of surgery.  Continue taking all prescribed medications with the exception of the following:  Per Dr. Rhea Bleacher instructions: stop aspirin 7 days prior, resume aspirin 14 days after   [place] WITH A SIP OF WATER:  Amlodipine Pulmicort nebulizer Gabapentin Metoprolol Omeprazole - (take one the night before and one on the morning of surgery - helps to prevent nausea after surgery.)  Use inhalers on the day of surgery and bring your albuterol  inhaler to the hospital.  No Alcohol for 24 hours before or after surgery.  No Smoking including e-cigarettes for 24 hours before surgery.  No chewable tobacco products for at least 6 hours before surgery.  No nicotine patches on the day of surgery.  Do not use any "recreational" drugs for at least a week (preferably 2 weeks) before your surgery.  Please be advised that the combination of cocaine and anesthesia may have negative outcomes, up to and including death. If you test positive for cocaine, your surgery will be cancelled.  On the morning of surgery brush your teeth with toothpaste and water, you may rinse your mouth with mouthwash if you wish. Do not swallow any toothpaste or mouthwash.  Use CHG Soap as directed on instruction sheet.  Do not wear jewelry, make-up, hairpins, clips or nail polish.  Do not wear lotions, powders, or perfumes.   Do not shave body hair from the neck down 48 hours before surgery.  Contact lenses, hearing aids and dentures may not be worn into surgery.  Do not bring valuables to the hospital. [facility] is not responsible for any missing/lost belongings or valuables.   Notify your doctor if there is any change in your medical condition (cold, fever, infection).  Wear comfortable clothing (specific to your surgery type) to the hospital.  After surgery, you can help prevent lung complications by doing breathing exercises.  Take deep breaths and cough every 1-2 hours. Your doctor may order a device called an Incentive Spirometer to help you take deep breaths.  If you are being discharged the day of surgery, you will not be allowed to drive home. You will need a responsible individual to drive you home and stay with you for 24 hours after surgery.   If you are taking public transportation, you will need to have a responsible individual with you.  Please call the [facility] Dept. at [phone] if you have any questions about these  instructions.  Surgery Visitation Policy:  Patients undergoing a surgery or procedure may have two family members or support persons with them as long as the person is not COVID-19 positive or experiencing its symptoms.     Preparing for Surgery with CHLORHEXIDINE GLUCONATE (CHG) Soap  Chlorhexidine Gluconate (CHG) Soap  o An antiseptic cleaner that kills germs and bonds with the skin to continue killing germs even after washing  o Used for showering the night before surgery and morning of surgery  Before surgery, you can play an important role by reducing the number of germs on your skin.  CHG (Chlorhexidine gluconate) soap is an antiseptic cleanser which kills germs and bonds with the skin to continue killing germs even after washing.  Please do not use if you have an allergy to CHG or antibacterial soaps. If your skin becomes reddened/irritated stop using the CHG.  1. Shower the NIGHT BEFORE SURGERY and the MORNING OF SURGERY with CHG soap.  2. If you choose to wash your hair, wash your hair first as usual with your normal shampoo.  3. After shampooing, rinse your hair and body thoroughly to remove the shampoo.  4. Use CHG as you would any other liquid soap. You can apply CHG directly to the skin and wash gently with a scrungie or a clean washcloth.  5. Apply the CHG soap to your body only from the neck down. Do not use on open wounds or open sores. Avoid contact with your eyes, ears, mouth, and genitals (private parts). Wash face and genitals (private parts) with your normal soap.  6. Wash thoroughly, paying special attention to the area where your surgery will be performed.  7. Thoroughly rinse your body with warm water.  8. Do not shower/wash with your normal soap after using and rinsing off the CHG soap.  9. Pat yourself dry with a clean towel.  10. Wear clean pajamas to bed the night before surgery.  12. Place clean sheets on your bed the night of your first shower  and do not sleep with pets.  13. Shower again with the CHG soap on the day of surgery prior to arriving at the hospital.  14. Do not apply any deodorants/lotions/powders.  15. Please wear clean clothes to the hospital.

## 2022-06-02 NOTE — Progress Notes (Signed)
  Perioperative Services  Abnormal Lab Notification and Treatment Plan of Care  Date: 06/02/22  Name: Marie Mills MRN:   [id number]  Re: Abnormal labs noted during PAT appointment  Provider Notified: Meade Maw, MD Notification mode: Routed and/or faxed via [place] of concern: Lab Results  Component Value Date   STAPHAUREUS POSITIVE (A) 06/02/2022   MRSAPCR POSITIVE (A) 06/02/2022     Notes: Patient is scheduled for a LEFT L3-4 MICRODISCECTOMY (Left) on 06/15/2022. She is scheduled to receive CEFAZOLIN pre-operatively. Surgical PCR (+) for MRSA; see above.  PLANS:  Review renal function. Estimated Creatinine Clearance: 115.8 mL/min (by C-G formula based on SCr of 0.66 mg/dL). Review allergies. No documented allergy to vancomycin. Order added for VANCOMYCIN 1 GRAM IV to current preoperative prophylactic regimen.  Patient with orders for both CEFAZOLIN + VANCOMYCIN to be given in the setting of documented MRSA (+) surgical PCR.   Guidelines suggest that a beta-lactam antibiotic (first or second generation cephalosporin) should be added for activity against gram-negative organisms.  Vancomycin appears to be less effective than cefazolin for preventing SSIs caused by MSSA. For this reason, the use of vancomycin in combination with cefazolin is favored for prevention of SSI due to MRSA and coagulase-negative staphylococci.  Notify primary attending surgeon of: (+) MRSA result Order has been placed for the preoperative Vancomycin dose.   Honor Loh, MSN, APRN, FNP-C, CEN [facility]  Peri-operative Services Nurse Practitioner Phone: [phone] 06/02/22 3:36 PM

## 2022-06-13 ENCOUNTER — Telehealth: Payer: Self-pay

## 2022-06-13 NOTE — Telephone Encounter (Signed)
-----   Message from Swan sent at 06/13/2022  9:29 AM EST ----- Regarding: Sinus Infection Scheduled for surgery Wednesday, called to let us know that she still has a sinus infection. No Fever but has had a consistent cough, sinus pressure, and drainage. Wants to know if this will effect her surgery/ needs to reschedule? CB: [phone]

## 2022-06-13 NOTE — Telephone Encounter (Signed)
Patient is aware of Dr.Yarbrough's response. She is will see him on 06/15/2022

## 2022-06-13 NOTE — Telephone Encounter (Signed)
I spoke with Ms Marie Mills. Her symptoms started about 2 months ago (cough, sinus pressure, drainage). She denies fever. She saw her PCP a month ago and went back 2 weeks ago and was given antibiotics, an inhaler, and a pill for cough, but it didn't help. She called them back because it didn't help and was given a steroid taper. She went back in and was told her lungs sound clear and was given a steroid injection and an antibiotic injection, but her symptoms are still the same. They have tested her covid and flu and they were negative. She states she even had these symptoms when she saw Dr Marie Mills on 05/17/22. Pre-op told her to call back if it gets close to the time of surgery and she still has the symptoms. Surgery is scheduled for 06/15/22.

## 2022-06-14 MED ORDER — ORAL CARE MOUTH RINSE: 15.0000 mL | Freq: Once | OROMUCOSAL | Status: AC

## 2022-06-14 MED ORDER — CEFAZOLIN IN SODIUM CHLORIDE 2-0.9 GM/100ML-% IV SOLN
3.0000 g | Freq: Once | INTRAVENOUS | Status: DC
  Filled 2022-06-14: qty 200

## 2022-06-14 MED ORDER — LACTATED RINGERS IV SOLN: INTRAVENOUS | Status: DC

## 2022-06-14 MED ORDER — VANCOMYCIN HCL IN DEXTROSE 1-5 GM/200ML-% IV SOLN: 1000.0000 mg | Freq: Once | INTRAVENOUS | Status: AC

## 2022-06-14 MED ORDER — CHLORHEXIDINE GLUCONATE 0.12 % MT SOLN: 15.0000 mL | Freq: Once | OROMUCOSAL | Status: AC

## 2022-06-15 ENCOUNTER — Encounter: Payer: Self-pay | Admitting: Neurosurgery

## 2022-06-15 ENCOUNTER — Ambulatory Visit: Payer: Medicaid Other | Admitting: Urgent Care

## 2022-06-15 ENCOUNTER — Ambulatory Visit
Admission: RE | Admit: 2022-06-15 | Discharge: 2022-06-15 | Disposition: A | Discharge status: Home / Self Care | Payer: Medicaid Other | Source: Ambulatory Visit | Attending: Neurosurgery | Admitting: Neurosurgery

## 2022-06-15 ENCOUNTER — Other Ambulatory Visit: Payer: Self-pay

## 2022-06-15 ENCOUNTER — Encounter
Admission: RE | Disposition: A | Discharge status: Home / Self Care | Payer: Self-pay | Source: Ambulatory Visit | Attending: Neurosurgery

## 2022-06-15 ENCOUNTER — Ambulatory Visit: Payer: Medicaid Other

## 2022-06-15 DIAGNOSIS — Z6841 Body Mass Index (BMI) 40.0 and over, adult: Secondary | ICD-10-CM | POA: Insufficient documentation

## 2022-06-15 DIAGNOSIS — M5416 Radiculopathy, lumbar region: Secondary | ICD-10-CM | POA: Diagnosis not present

## 2022-06-15 DIAGNOSIS — Z79899 Other long term (current) drug therapy: Secondary | ICD-10-CM | POA: Diagnosis not present

## 2022-06-15 DIAGNOSIS — Z01812 Encounter for preprocedural laboratory examination: Secondary | ICD-10-CM

## 2022-06-15 DIAGNOSIS — M5116 Intervertebral disc disorders with radiculopathy, lumbar region: Secondary | ICD-10-CM | POA: Diagnosis present

## 2022-06-15 DIAGNOSIS — Z01818 Encounter for other preprocedural examination: Secondary | ICD-10-CM

## 2022-06-15 DIAGNOSIS — K219 Gastro-esophageal reflux disease without esophagitis: Secondary | ICD-10-CM | POA: Insufficient documentation

## 2022-06-15 DIAGNOSIS — I10 Essential (primary) hypertension: Secondary | ICD-10-CM | POA: Diagnosis not present

## 2022-06-15 DIAGNOSIS — J45909 Unspecified asthma, uncomplicated: Secondary | ICD-10-CM | POA: Diagnosis not present

## 2022-06-15 DIAGNOSIS — M5126 Other intervertebral disc displacement, lumbar region: Secondary | ICD-10-CM

## 2022-06-15 HISTORY — PX: LUMBAR LAMINECTOMY/DECOMPRESSION MICRODISCECTOMY: SHX5026

## 2022-06-15 LAB — POCT I-STAT, CHEM 8
BUN: 16 mg/dL (ref 6–20)
Calcium, Ion: 1.14 mmol/L — ABNORMAL LOW (ref 1.15–1.40)
Chloride: 110 mmol/L (ref 98–111)
Creatinine, Ser: 0.5 mg/dL (ref 0.44–1.00)
Glucose, Bld: 94 mg/dL (ref 70–99)
HCT: 37 % (ref 36.0–46.0)
Hemoglobin: 12.6 g/dL (ref 12.0–15.0)
Potassium: 3.8 mmol/L (ref 3.5–5.1)
Sodium: 140 mmol/L (ref 135–145)
TCO2: 20 mmol/L — ABNORMAL LOW (ref 22–32)

## 2022-06-15 LAB — POCT PREGNANCY, URINE: Preg Test, Ur: NEGATIVE

## 2022-06-15 SURGERY — LUMBAR LAMINECTOMY/DECOMPRESSION MICRODISCECTOMY 1 LEVEL
Anesthesia: General | Site: Spine Lumbar | Laterality: Left

## 2022-06-15 MED ORDER — ROCURONIUM BROMIDE 100 MG/10ML IV SOLN
INTRAVENOUS | Status: DC | PRN
  Administered 2022-06-15: 50 mg via INTRAVENOUS

## 2022-06-15 MED ORDER — PHENYLEPHRINE HCL (PRESSORS) 10 MG/ML IV SOLN
INTRAVENOUS | Status: DC | PRN
  Administered 2022-06-15 (×3): 80 ug via INTRAVENOUS
  Administered 2022-06-15: 160 ug via INTRAVENOUS
  Administered 2022-06-15 (×4): 80 ug via INTRAVENOUS
  Administered 2022-06-15: 160 ug via INTRAVENOUS

## 2022-06-15 MED ORDER — ACETAMINOPHEN 10 MG/ML IV SOLN: 1000.0000 mg | Freq: Once | INTRAVENOUS | Status: DC | PRN

## 2022-06-15 MED ORDER — DROPERIDOL 2.5 MG/ML IJ SOLN
INTRAMUSCULAR | Status: AC
  Filled 2022-06-15: qty 2

## 2022-06-15 MED ORDER — PROPOFOL 10 MG/ML IV BOLUS
INTRAVENOUS | Status: AC
  Filled 2022-06-15: qty 20

## 2022-06-15 MED ORDER — SUGAMMADEX SODIUM 200 MG/2ML IV SOLN
INTRAVENOUS | Status: DC | PRN
  Administered 2022-06-15: 200 mg via INTRAVENOUS

## 2022-06-15 MED ORDER — ONDANSETRON HCL 4 MG/2ML IJ SOLN
INTRAMUSCULAR | Status: AC
  Filled 2022-06-15: qty 2

## 2022-06-15 MED ORDER — ACETAMINOPHEN 10 MG/ML IV SOLN
INTRAVENOUS | Status: AC
  Filled 2022-06-15: qty 100

## 2022-06-15 MED ORDER — ONDANSETRON HCL 4 MG/2ML IJ SOLN
INTRAMUSCULAR | Status: DC | PRN
  Administered 2022-06-15: 4 mg via INTRAVENOUS

## 2022-06-15 MED ORDER — SENNA 8.6 MG PO TABS: 1.0000 | ORAL_TABLET | Freq: Every day | ORAL | 0 refills | Status: DC | PRN

## 2022-06-15 MED ORDER — DEXMEDETOMIDINE HCL IN NACL 80 MCG/20ML IV SOLN
INTRAVENOUS | Status: DC | PRN
  Administered 2022-06-15: 8 ug via BUCCAL

## 2022-06-15 MED ORDER — METHYLPREDNISOLONE ACETATE 40 MG/ML IJ SUSP
INTRAMUSCULAR | Status: DC | PRN
  Administered 2022-06-15: 40 mg

## 2022-06-15 MED ORDER — PROPOFOL 10 MG/ML IV BOLUS
INTRAVENOUS | Status: DC | PRN
  Administered 2022-06-15 (×2): 50 mg via INTRAVENOUS
  Administered 2022-06-15: 200 mg via INTRAVENOUS

## 2022-06-15 MED ORDER — PHENYLEPHRINE HCL-NACL 20-0.9 MG/250ML-% IV SOLN
INTRAVENOUS | Status: AC
  Filled 2022-06-15: qty 250

## 2022-06-15 MED ORDER — OXYCODONE HCL 5 MG PO TABS: 5.0000 mg | ORAL_TABLET | Freq: Once | ORAL | Status: AC | PRN

## 2022-06-15 MED ORDER — METHOCARBAMOL 500 MG PO TABS: 750.0000 mg | ORAL_TABLET | Freq: Four times a day (QID) | ORAL | Status: DC | PRN

## 2022-06-15 MED ORDER — HYDROMORPHONE HCL 1 MG/ML IJ SOLN
INTRAMUSCULAR | Status: AC
  Filled 2022-06-15: qty 1

## 2022-06-15 MED ORDER — PHENYLEPHRINE 80 MCG/ML (10ML) SYRINGE FOR IV PUSH (FOR BLOOD PRESSURE SUPPORT)
PREFILLED_SYRINGE | INTRAVENOUS | Status: AC
  Filled 2022-06-15: qty 10

## 2022-06-15 MED ORDER — BUPIVACAINE LIPOSOME 1.3 % IJ SUSP
INTRAMUSCULAR | Status: AC
  Filled 2022-06-15: qty 20

## 2022-06-15 MED ORDER — PROPOFOL 1000 MG/100ML IV EMUL
INTRAVENOUS | Status: AC
  Filled 2022-06-15: qty 200

## 2022-06-15 MED ORDER — BUPIVACAINE HCL (PF) 0.5 % IJ SOLN
INTRAMUSCULAR | Status: AC
  Filled 2022-06-15: qty 60

## 2022-06-15 MED ORDER — FENTANYL CITRATE (PF) 100 MCG/2ML IJ SOLN
INTRAMUSCULAR | Status: DC | PRN
  Administered 2022-06-15 (×2): 50 ug via INTRAVENOUS

## 2022-06-15 MED ORDER — HYDROMORPHONE HCL 1 MG/ML IJ SOLN
INTRAMUSCULAR | Status: DC | PRN
  Administered 2022-06-15: .5 mg via INTRAVENOUS
  Administered 2022-06-15: .25 mg via INTRAVENOUS
  Administered 2022-06-15: .5 mg via INTRAVENOUS
  Administered 2022-06-15: .25 mg via INTRAVENOUS

## 2022-06-15 MED ORDER — SURGIFLO WITH THROMBIN (HEMOSTATIC MATRIX KIT) OPTIME
TOPICAL | Status: DC | PRN
  Administered 2022-06-15: 1 via TOPICAL

## 2022-06-15 MED ORDER — GLYCOPYRROLATE 0.2 MG/ML IJ SOLN
INTRAMUSCULAR | Status: DC | PRN
  Administered 2022-06-15: .2 mg via INTRAVENOUS

## 2022-06-15 MED ORDER — EPINEPHRINE PF 1 MG/ML IJ SOLN
INTRAMUSCULAR | Status: AC
  Filled 2022-06-15: qty 1

## 2022-06-15 MED ORDER — ROCURONIUM BROMIDE 10 MG/ML (PF) SYRINGE
PREFILLED_SYRINGE | INTRAVENOUS | Status: AC
  Filled 2022-06-15: qty 10

## 2022-06-15 MED ORDER — FENTANYL CITRATE (PF) 100 MCG/2ML IJ SOLN: 25.0000 ug | INTRAMUSCULAR | Status: DC | PRN

## 2022-06-15 MED ORDER — DEXAMETHASONE SODIUM PHOSPHATE 10 MG/ML IJ SOLN
INTRAMUSCULAR | Status: AC
  Filled 2022-06-15: qty 1

## 2022-06-15 MED ORDER — METHYLPREDNISOLONE ACETATE 40 MG/ML IJ SUSP
INTRAMUSCULAR | Status: AC
  Filled 2022-06-15: qty 1

## 2022-06-15 MED ORDER — METHOCARBAMOL 750 MG PO TABS: 750.0000 mg | ORAL_TABLET | Freq: Four times a day (QID) | ORAL | 0 refills | Status: DC

## 2022-06-15 MED ORDER — LIDOCAINE HCL (PF) 2 % IJ SOLN
INTRAMUSCULAR | Status: AC
  Filled 2022-06-15: qty 10

## 2022-06-15 MED ORDER — CHLORHEXIDINE GLUCONATE 0.12 % MT SOLN
OROMUCOSAL | Status: AC
  Administered 2022-06-15: 15 mL via OROMUCOSAL
  Filled 2022-06-15: qty 15

## 2022-06-15 MED ORDER — DROPERIDOL 2.5 MG/ML IJ SOLN
0.6250 mg | Freq: Once | INTRAMUSCULAR | Status: AC | PRN
  Administered 2022-06-15: 0.625 mg via INTRAVENOUS

## 2022-06-15 MED ORDER — ACETAMINOPHEN 500 MG PO TABS: 1000.0000 mg | ORAL_TABLET | Freq: Once | ORAL | Status: DC

## 2022-06-15 MED ORDER — PROPOFOL 10 MG/ML IV BOLUS
INTRAVENOUS | Status: AC
  Filled 2022-06-15: qty 40

## 2022-06-15 MED ORDER — LIDOCAINE HCL (CARDIAC) PF 100 MG/5ML IV SOSY
PREFILLED_SYRINGE | INTRAVENOUS | Status: DC | PRN
  Administered 2022-06-15: 100 mg via INTRAVENOUS

## 2022-06-15 MED ORDER — FENTANYL CITRATE (PF) 100 MCG/2ML IJ SOLN
INTRAMUSCULAR | Status: AC
  Filled 2022-06-15: qty 2

## 2022-06-15 MED ORDER — MIDAZOLAM HCL 2 MG/2ML IJ SOLN
INTRAMUSCULAR | Status: DC | PRN
  Administered 2022-06-15: 2 mg via INTRAVENOUS

## 2022-06-15 MED ORDER — ACETAMINOPHEN 10 MG/ML IV SOLN
INTRAVENOUS | Status: DC | PRN
  Administered 2022-06-15: 1000 mg via INTRAVENOUS

## 2022-06-15 MED ORDER — OXYCODONE HCL 5 MG PO TABS: 5.0000 mg | ORAL_TABLET | ORAL | 0 refills | Status: DC | PRN

## 2022-06-15 MED ORDER — OXYCODONE HCL 5 MG PO TABS
ORAL_TABLET | ORAL | Status: AC
  Administered 2022-06-15: 5 mg via ORAL
  Filled 2022-06-15: qty 1

## 2022-06-15 MED ORDER — BUPIVACAINE-EPINEPHRINE (PF) 0.5% -1:200000 IJ SOLN
INTRAMUSCULAR | Status: DC | PRN
  Administered 2022-06-15: 3.5 mL via PERINEURAL

## 2022-06-15 MED ORDER — GLYCOPYRROLATE 0.2 MG/ML IJ SOLN
INTRAMUSCULAR | Status: AC
  Filled 2022-06-15: qty 1

## 2022-06-15 MED ORDER — VANCOMYCIN HCL IN DEXTROSE 1-5 GM/200ML-% IV SOLN
INTRAVENOUS | Status: AC
  Administered 2022-06-15: 1000 mg via INTRAVENOUS
  Filled 2022-06-15: qty 200

## 2022-06-15 MED ORDER — METHOCARBAMOL 750 MG PO TABS
ORAL_TABLET | ORAL | Status: AC
  Administered 2022-06-15: 750 mg via ORAL
  Filled 2022-06-15: qty 1

## 2022-06-15 MED ORDER — PROMETHAZINE HCL 25 MG/ML IJ SOLN: 6.2500 mg | INTRAMUSCULAR | Status: DC | PRN

## 2022-06-15 MED ORDER — OXYCODONE HCL 5 MG/5ML PO SOLN: 5.0000 mg | Freq: Once | ORAL | Status: AC | PRN

## 2022-06-15 MED ORDER — 0.9 % SODIUM CHLORIDE (POUR BTL) OPTIME
TOPICAL | Status: DC | PRN
  Administered 2022-06-15: 500 mL

## 2022-06-15 MED ORDER — PROPOFOL 1000 MG/100ML IV EMUL
INTRAVENOUS | Status: AC
  Filled 2022-06-15: qty 100

## 2022-06-15 MED ORDER — SODIUM CHLORIDE (PF) 0.9 % IJ SOLN
INTRAMUSCULAR | Status: DC | PRN
  Administered 2022-06-15: 60 mL via INTRAMUSCULAR

## 2022-06-15 MED ORDER — DEXAMETHASONE SODIUM PHOSPHATE 10 MG/ML IJ SOLN
INTRAMUSCULAR | Status: DC | PRN
  Administered 2022-06-15: 10 mg via INTRAVENOUS

## 2022-06-15 MED ORDER — DEXMEDETOMIDINE HCL IN NACL 80 MCG/20ML IV SOLN
INTRAVENOUS | Status: AC
  Filled 2022-06-15: qty 20

## 2022-06-15 MED ORDER — MIDAZOLAM HCL 2 MG/2ML IJ SOLN
INTRAMUSCULAR | Status: AC
  Filled 2022-06-15: qty 2

## 2022-06-15 SURGICAL SUPPLY — 47 items
ADH SKN CLS APL DERMABOND .7 (GAUZE/BANDAGES/DRESSINGS) ×1
AGENT HMST KT MTR STRL THRMB (HEMOSTASIS) ×1
APL PRP STRL LF DISP 70% ISPRP (MISCELLANEOUS) ×2
BASIN KIT SINGLE STR (MISCELLANEOUS) ×1 IMPLANT
BUR NEURO DRILL SOFT 3.0X3.8M (BURR) ×1 IMPLANT
CHLORAPREP W/TINT 26 (MISCELLANEOUS) ×1 IMPLANT
CNTNR URN SCR LID CUP LEK RST (MISCELLANEOUS) ×1 IMPLANT
CONT SPEC 4OZ STRL OR WHT (MISCELLANEOUS) ×1
DERMABOND ADVANCED .7 DNX12 (GAUZE/BANDAGES/DRESSINGS) ×1 IMPLANT
DRAPE C ARM PK CFD 31 SPINE (DRAPES) ×1 IMPLANT
DRAPE LAPAROTOMY 100X77 ABD (DRAPES) ×1 IMPLANT
DRAPE MICROSCOPE SPINE 48X150 (DRAPES) ×1 IMPLANT
DRAPE SURG 17X11 SM STRL (DRAPES) ×1 IMPLANT
DRSG OPSITE POSTOP 3X4 (GAUZE/BANDAGES/DRESSINGS) IMPLANT
ELECT EZSTD 165MM 6.5IN (MISCELLANEOUS)
ELECT REM PT RETURN 9FT ADLT (ELECTROSURGICAL) ×1
ELECTRODE EZSTD 165MM 6.5IN (MISCELLANEOUS) IMPLANT
ELECTRODE REM PT RTRN 9FT ADLT (ELECTROSURGICAL) ×1 IMPLANT
GLOVE BIOGEL PI IND STRL 8.5 (GLOVE) ×1 IMPLANT
GLOVE SURG SYN 6.5 ES PF (GLOVE) ×2 IMPLANT
GLOVE SURG SYN 6.5 PF PI (GLOVE) ×2 IMPLANT
GLOVE SURG SYN 8.5  E (GLOVE) ×3
GLOVE SURG SYN 8.5 E (GLOVE) ×3 IMPLANT
GLOVE SURG SYN 8.5 PF PI (GLOVE) ×3 IMPLANT
GLOVE SURG UNDER POLY LF SZ6.5 (GLOVE) ×1 IMPLANT
GLOVE SURG UNDER POLY LF SZ8.5 (GLOVE) ×1 IMPLANT
GOWN SRG LRG LVL 4 IMPRV REINF (GOWNS) ×1 IMPLANT
GOWN SRG XL LVL 3 NONREINFORCE (GOWNS) ×1 IMPLANT
GOWN STRL NON-REIN TWL XL LVL3 (GOWNS) ×1
GOWN STRL REIN LRG LVL4 (GOWNS) ×1
KIT SPINAL PRONEVIEW (KITS) ×1 IMPLANT
MANIFOLD NEPTUNE II (INSTRUMENTS) ×1 IMPLANT
MARKER SKIN DUAL TIP RULER LAB (MISCELLANEOUS) ×1 IMPLANT
NDL SAFETY ECLIP 18X1.5 (MISCELLANEOUS) ×1 IMPLANT
NS IRRIG 1000ML POUR BTL (IV SOLUTION) ×1 IMPLANT
PACK LAMINECTOMY NEURO (CUSTOM PROCEDURE TRAY) ×1 IMPLANT
PAD ARMBOARD 7.5X6 YLW CONV (MISCELLANEOUS) ×1 IMPLANT
SURGIFLO W/THROMBIN 8M KIT (HEMOSTASIS) ×1 IMPLANT
SUT DVC VLOC 3-0 CL 6 P-12 (SUTURE) ×1 IMPLANT
SUT VIC AB 0 CT1 27 (SUTURE) ×1
SUT VIC AB 0 CT1 27XCR 8 STRN (SUTURE) ×1 IMPLANT
SUT VIC AB 2-0 CT1 18 (SUTURE) ×1 IMPLANT
SYR 10ML LL (SYRINGE) ×2 IMPLANT
SYR 30ML LL (SYRINGE) ×2 IMPLANT
SYR 3ML LL SCALE MARK (SYRINGE) ×1 IMPLANT
TRAP FLUID SMOKE EVACUATOR (MISCELLANEOUS) ×1 IMPLANT
WATER STERILE IRR 1000ML POUR (IV SOLUTION) ×2 IMPLANT

## 2022-06-15 NOTE — Anesthesia Procedure Notes (Signed)
Procedure Name: Intubation Date/Time: 06/15/2022 7:20 AM  Performed by: Demetrius Charity, CRNAPre-anesthesia Checklist: Patient identified, Patient being monitored, Timeout performed, Emergency Drugs available and Suction available Patient Re-evaluated:Patient Re-evaluated prior to induction Oxygen Delivery Method: Circle system utilized Preoxygenation: Pre-oxygenation with 100% oxygen Induction Type: IV induction Ventilation: Mask ventilation without difficulty and Oral airway inserted - appropriate to patient size Laryngoscope Size: 3 and McGraph Grade View: Grade I Tube type: Oral Tube size: 6.5 mm Number of attempts: 1 Airway Equipment and Method: Stylet Placement Confirmation: ETT inserted through vocal cords under direct vision, positive ETCO2 and breath sounds checked- equal and bilateral Secured at: 21 cm Tube secured with: Tape Dental Injury: Teeth and Oropharynx as per pre-operative assessment

## 2022-06-15 NOTE — Discharge Summary (Signed)
Physician Discharge Summary  Patient ID: Marie Mills MRN: [id number] DOB/AGE: 16-Nov-1970 52 y.o.  Admit date: 06/15/2022 Discharge date: 06/15/2022  Admission Diagnoses: Lumbar radiculopathy   Discharge Diagnoses:  Active Problems:   Lumbar disc herniation   Discharged Condition: good  Hospital Course:  Marie Mills is a 52 y.o presenting with lumbar radiculopathy s/p left L3-4 microdiscectomy. Her intraoperative course was uncomplicated. She was monitored in PACU post-op and was discharged home after ambulating, urinating, and tolerating PO intake. She was given medications for pain, Robaxin, and Senna.  Consults: None  Significant Diagnostic Studies: none  Treatments: surgery: as above. Please see separately dictated operative note for further details   Discharge Exam: Blood pressure (!) 114/54, pulse 93, temperature (!) 97 F (36.1 C), temperature source Temporal, resp. rate 15, height '5\' 6"'$  (1.676 m), weight 124.7 kg, last menstrual period 05/12/2022, SpO2 97 %. CN II-XII grossly intact MAEW Incision c.d.I with post-op dressing in place  Disposition: Discharge disposition: 01-Home or Self Care        Allergies as of 06/15/2022       Reactions   Penicillins Anaphylaxis   Has patient had a PCN reaction causing immediate rash, facial/tongue/throat swelling, SOB or lightheadedness with hypotension: Yes Has patient had a PCN reaction causing severe rash involving mucus membranes or skin necrosis: No Has patient had a PCN reaction that required hospitalization Yes Has patient had a PCN reaction occurring within the last 10 years: No If all of the above answers are "NO", then may proceed with Cephalosporin use.   Codeine Other (See Comments)   Chest Pain   Sulfa Antibiotics Hives        Medication List     STOP taking these medications    aspirin EC 81 MG tablet   tiZANidine 4 MG tablet Commonly known as: ZANAFLEX       TAKE these medications     acetaminophen 325 MG tablet Commonly known as: Tylenol Take 2 tablets (650 mg total) by mouth every 6 (six) hours as needed.   albuterol 108 (90 Base) MCG/ACT inhaler Commonly known as: VENTOLIN HFA Inhale 2 puffs into the lungs every 4 (four) hours as needed for wheezing or shortness of breath.   amLODipine 2.5 MG tablet Commonly known as: NORVASC TAKE 1 TABLET(2.5 MG) BY MOUTH DAILY What changed: See the new instructions.   azelastine 0.1 % nasal spray Commonly known as: ASTELIN Place 2 sprays into both nostrils 2 (two) times daily. Use in each nostril as directed What changed:  when to take this reasons to take this   budesonide 1 MG/2ML nebulizer solution Commonly known as: PULMICORT Take 1 mg by nebulization daily as needed.   cetirizine 10 MG tablet Commonly known as: ZYRTEC Take 1 tablet (10 mg total) by mouth daily.   clotrimazole-betamethasone cream Commonly known as: Lotrisone Apply 1 Application topically 2 (two) times daily. What changed:  when to take this reasons to take this   fluticasone 50 MCG/ACT nasal spray Commonly known as: FLONASE Place 1 spray into both nostrils daily as needed.   gabapentin 300 MG capsule Commonly known as: NEURONTIN Take 1 capsule (300 mg total) by mouth 3 (three) times daily.   ibuprofen 600 MG tablet Commonly known as: ADVIL Take 1 tablet (600 mg total) by mouth every 6 (six) hours as needed.   ipratropium 0.06 % nasal spray Commonly known as: ATROVENT Place 2 sprays into both nostrils 4 (four) times daily as needed.   losartan 50  MG tablet Commonly known as: COZAAR Take 50 mg by mouth daily.   magnesium gluconate 500 MG tablet Commonly known as: MAGONATE Take 500 mg by mouth 2 (two) times daily.   methocarbamol 750 MG tablet Commonly known as: Robaxin-750 Take 1 tablet (750 mg total) by mouth 4 (four) times daily.   metoprolol succinate 25 MG 24 hr tablet Commonly known as: TOPROL-XL Take 25 mg by mouth  daily.   montelukast 10 MG tablet Commonly known as: SINGULAIR Take 10 mg by mouth daily.   nystatin cream Commonly known as: MYCOSTATIN Apply 1 Application topically 2 (two) times daily. What changed:  when to take this reasons to take this   omeprazole 40 MG capsule Commonly known as: PRILOSEC TK 1 C PO QD What changed:  how much to take how to take this when to take this additional instructions   oxyCODONE 5 MG immediate release tablet Commonly known as: Roxicodone Take 1 tablet (5 mg total) by mouth every 4 (four) hours as needed for severe pain.   Potassium 99 MG Tabs Take 1 tablet by mouth daily.   pyridOXINE 100 MG tablet Commonly known as: VITAMIN B6 Take 100 mg by mouth daily.   senna 8.6 MG Tabs tablet Commonly known as: SENOKOT Take 1 tablet (8.6 mg total) by mouth daily as needed for mild constipation.   sulindac 150 MG tablet Commonly known as: CLINORIL TAKE 1 TABLET(150 MG) BY MOUTH TWICE DAILY   TRI-SPRINTEC PO Take 1 tablet by mouth daily.   valACYclovir 1000 MG tablet Commonly known as: Valtrex Take 1 tablet (1,000 mg total) by mouth 2 (two) times daily. What changed:  when to take this reasons to take this        Follow-up Information     Loleta Dicker, PA Follow up on 06/30/2022.   Specialty: Neurosurgery Why: Thursday 06/30/2022 @ 11:30 a.m. Contact information: [address] [place] [address] [phone]                 Signed: Loleta Dicker 06/15/2022, 1:42 PM

## 2022-06-15 NOTE — Progress Notes (Signed)
Up ambulating to bathroom. Voiding with difficulty. Pain down to 3/10. Voices readiness for discharge.Still presents with some weakness down l hip and leg but no new weakness or numbness.

## 2022-06-15 NOTE — Discharge Instructions (Addendum)
Your surgeon has performed an operation on your lumbar spine (low back) to relieve pressure on one or more nerves. Many times, patients feel better immediately after surgery and can "overdo it." Even if you feel well, it is important that you follow these activity guidelines. If you do not let your back heal properly from the surgery, you can increase the chance of a disc herniation and/or return of your symptoms. The following are instructions to help in your recovery once you have been discharged from the hospital.  * It is ok to take NSAIDs after surgery.  Activity    No bending, lifting, or twisting ("BLT"). Avoid lifting objects heavier than 10 pounds (gallon milk jug).  Where possible, avoid household activities that involve lifting, bending, pushing, or pulling such as laundry, vacuuming, grocery shopping, and childcare. Try to arrange for help from friends and family for these activities while your back heals.  Increase physical activity slowly as tolerated.  Taking short walks is encouraged, but avoid strenuous exercise. Do not jog, run, bicycle, lift weights, or participate in any other exercises unless specifically allowed by your doctor. Avoid prolonged sitting, including car rides.  Talk to your doctor before resuming sexual activity.  You should not drive until cleared by your doctor.  Until released by your doctor, you should not return to work or school.  You should rest at home and let your body heal.   You may shower two days after your surgery.  After showering, lightly dab your incision dry. Do not take a tub bath or go swimming for 3 weeks, or until approved by your doctor at your follow-up appointment.  If you smoke, we strongly recommend that you quit.  Smoking has been proven to interfere with normal healing in your back and will dramatically reduce the success rate of your surgery. Please contact [facility] (800-QUIT-NOW) and use the resources at www.[facility].com for  assistance in stopping smoking.  Surgical Incision   If you have a dressing on your incision, you may remove it three days after your surgery. Keep your incision area clean and dry.  If you have staples or stitches on your incision, you should have a follow up scheduled for removal. If you do not have staples or stitches, you will have steri-strips (small pieces of surgical tape) or Dermabond glue. The steri-strips/glue should begin to peel away within about a week (it is fine if the steri-strips fall off before then). If the strips are still in place one week after your surgery, you may gently remove them.  Diet            You may return to your usual diet. Be sure to stay hydrated.  When to Contact us  Although your surgery and recovery will likely be uneventful, you may have some residual numbness, aches, and pains in your back and/or legs. This is normal and should improve in the next few weeks.  However, should you experience any of the following, contact us immediately: New numbness or weakness Pain that is progressively getting worse, and is not relieved by your pain medications or rest Bleeding, redness, swelling, pain, or drainage from surgical incision Chills or flu-like symptoms Fever greater than 101.0 F (38.3 C) Problems with bowel or bladder functions Difficulty breathing or shortness of breath Warmth, tenderness, or swelling in your calf  Contact Information During office hours (Monday-Friday 9 am to 5 pm), please call your physician at [phone] and ask for Marie Mills After hours and  weekends, please call [phone] and speak with the neurosurgeon on call For a life-threatening emergency, call [place]  The drugs that you were given will stay in your system until tomorrow so for the next 24 hours you should not:  Drive an automobile Make any legal decisions Drink any alcoholic beverage  You may resume regular  meals tomorrow.  Today it is better to start with liquids and gradually work up to solid foods.  You may eat anything you prefer, but it is better to start with liquids, then soup and crackers, and gradually work up to solid foods.  Please notify your doctor immediately if you have any unusual bleeding, trouble breathing, redness and pain at the surgery site, drainage, fever, or pain not relieved by medication.  Additional Instructions:  Please contact your physician with any problems or Same Day Surgery at [phone], Monday through Friday 6 am to 4 pm, or  at [facility] number at [phone].

## 2022-06-15 NOTE — Anesthesia Preprocedure Evaluation (Addendum)
Anesthesia Evaluation  Patient identified by MRN, date of birth, ID band Patient awake    Reviewed: Allergy & Precautions, H&P , NPO status , Patient's Chart, lab work & pertinent test results, reviewed documented beta blocker date and time   Airway Mallampati: II  TM Distance: >3 FB Neck ROM: full    Dental no notable dental hx.    Pulmonary asthma    Pulmonary exam normal        Cardiovascular hypertension, Pt. on home beta blockers and Pt. on medications Normal cardiovascular exam     Neuro/Psych  PSYCHIATRIC DISORDERS Anxiety Depression     Neuromuscular disease    GI/Hepatic Neg liver ROS,GERD  Medicated and Controlled,,  Endo/Other    Morbid obesity  Renal/GU      Musculoskeletal   Abdominal  (+) + obese  Peds  Hematology negative hematology ROS (+)   Anesthesia Other Findings Hypokalemia 3.2 06/02/2022  Past Medical History: No date: Asthma 08/29/2013: BV (bacterial vaginosis) 03/21/2013: Contraceptive management No date: GERD (gastroesophageal reflux disease) No date: Hypertension No date: Lumbar radiculopathy 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     Comment:  a.) properative PCR (+) prior to LEFT L3-4               MICRODISCECTOMY No date: Obesity No date: Pneumonia No date: Seizure [facility])     Comment:  age 50-3; unknown cause nor if on medication  Past Surgical History: 1987: CHOLECYSTECTOMY 07/30/2021: COLONOSCOPY WITH PROPOFOL; N/A     Comment:  Procedure: COLONOSCOPY WITH PROPOFOL;  Surgeon: Eloise Harman, DO;  Location: [facility];  Service:               Endoscopy;  Laterality: N/A;  2:45pm 07/30/2021: POLYPECTOMY     Comment:  Procedure: POLYPECTOMY;  Surgeon: Eloise Harman, DO;              Location: [facility];  Service: Endoscopy;;     Reproductive/Obstetrics negative OB ROS                             Anesthesia  Physical Anesthesia Plan  ASA: 3  Anesthesia Plan: General ETT   Post-op Pain Management: Toradol IV (intra-op)*, Tylenol PO (pre-op)* and Regional block*   Induction:   PONV Risk Score and Plan: 3 and Ondansetron, Dexamethasone and Midazolam  Airway Management Planned: Oral ETT  Additional Equipment:   Intra-op Plan:   Post-operative Plan: Extubation in OR  Informed Consent: I have reviewed the patients History and Physical, chart, labs and discussed the procedure including the risks, benefits and alternatives for the proposed anesthesia with the patient or authorized representative who has indicated his/her understanding and acceptance.     Dental Advisory Given  Plan Discussed with: CRNA and Surgeon  Anesthesia Plan Comments:         Anesthesia Quick Evaluation

## 2022-06-15 NOTE — Interval H&P Note (Signed)
History and Physical Interval Note:  06/15/2022 7:03 AM  Marie Mills  has presented today for surgery, with the diagnosis of M54.16 lumbar radiculopathy.  The various methods of treatment have been discussed with the patient and family. After consideration of risks, benefits and other options for treatment, the patient has consented to  Procedure(s): LEFT L3-4 MICRODISCECTOMY (Left) as a surgical intervention.  The patient's history has been reviewed, patient examined, no change in status, stable for surgery.  I have reviewed the patient's chart and labs.  Questions were answered to the patient's satisfaction.    Heart sounds normal no MRG. Chest Clear to Auscultation Bilaterally.   [name]

## 2022-06-15 NOTE — Anesthesia Postprocedure Evaluation (Signed)
Anesthesia Post Note  Patient: Marie Mills  Procedure(s) Performed: LEFT L3-4 MICRODISCECTOMY (Left: Spine Lumbar)  Patient location during evaluation: PACU Anesthesia Type: General Level of consciousness: awake and alert Pain management: pain level controlled Vital Signs Assessment: post-procedure vital signs reviewed and stable Respiratory status: spontaneous breathing, nonlabored ventilation, respiratory function stable and patient connected to nasal cannula oxygen Cardiovascular status: blood pressure returned to baseline and stable Postop Assessment: no apparent nausea or vomiting Anesthetic complications: no   No notable events documented.   Last Vitals:  Vitals:   06/15/22 1025 06/15/22 1030  BP:  110/61  Pulse: 91 91  Resp: 15 15  Temp:    SpO2: 94% 98%    Last Pain:  Vitals:   06/15/22 1020  TempSrc:   PainSc: 0-No pain                 Arita Miss

## 2022-06-15 NOTE — Op Note (Signed)
Indications: [name] is suffering from lumbar radiculopathy. The patient tried and failed conservative management, prompting surgical intervention.  Findings: disc herniation  Preoperative Diagnosis: Lumbar radiculopathy (ICD-10 M54.16) Postoperative Diagnosis: same   EBL: 25 ml IVF: see AR ml Drains: none Disposition: Extubated and Stable to PACU Complications: none  No foley catheter was placed.   Preoperative Note:   Risks of surgery discussed include: infection, bleeding, stroke, coma, death, paralysis, CSF leak, nerve/spinal cord injury, numbness, tingling, weakness, complex regional pain syndrome, recurrent stenosis and/or disc herniation, vascular injury, development of instability, neck/back pain, need for further surgery, persistent symptoms, development of deformity, and the risks of anesthesia. The patient understood these risks and agreed to proceed.  Operative Note:   1) Left L3/4 microdiscectomy  The patient was then brought from the preoperative center with intravenous access established.  The patient underwent general anesthesia and endotracheal tube intubation, and was then rotated on the [place] rail top where all pressure points were appropriately padded.  The skin was then thoroughly cleansed.  Perioperative antibiotic prophylaxis was administered.  Sterile prep and drapes were then applied and a timeout was then observed.  C-arm was brought into the field under sterile conditions, and the L3-4 disc space identified and marked with an incision on the left 1cm lateral to midline.  Once this was complete a 2 cm incision was opened with the use of a #10 blade knife.  The Metrx tubes were sequentially advanced under lateral fluoroscopy until a 18 x 80 mm Metrx tube was placed over the facet and lamina and secured to the bed.    The microscope was then sterilely brought into the field and muscle creep was hemostased with a bipolar and resected with a pituitary  rongeur.  A Bovie extender was then used to expose the spinous process and lamina.  Careful attention was placed to not violate the facet capsule. A 3 mm matchstick drill bit was then used to make a hemi-laminotomy trough until the ligamentum flavum was exposed.  This was extended to the base of the spinous process.  Once this was complete and the underlying ligamentum flavum was visualized, the ligamentum was dissected with an up angle curette and resected with a #2 and #3 mm biting Kerrison.  The laminotomy opening was also expanded in similar fashion and hemostasis was obtained with Surgifoam and a patty as well as bone wax.  The rostral aspect of the caudal level of the lamina was also resected with a #2 biting Kerrison effort to further enhance exposure.  Once the underlying dura was visualized a Penfield 4 was then used to dissect and expose the traversing nerve root.  Once this was identified a nerve root retractor suction was used to mobilize this medially.  The venous plexus was hemostased with Surgifoam and light bipolar use.  A small penfied was then used to make a small annulotomy within the disc space and disc space contents were noted to come through the annulus.    The disc herniation was identified and dissected free using a balltip probe. The pituitary rongeur was used to remove the extruded disc fragments. Once the thecal sac and nerve root were noted to be relaxed and under less tension the ball-tipped feeler was passed along the foramen distally to ensure no residual compression was noted.    Depo-Medrol was placed along the nerve root.  The area was irrigated. The tube system was then removed under microscopic visualization and hemostasis was obtained with  a bipolar.    The fascial layer was reapproximated with the use of a 0- Vicryl suture.  Subcutaneous tissue layer was reapproximated using 2-0 Vicryl suture.  3-0 monocryl was used on the skin. The skin was then cleansed and Dermabond  was used to close the skin opening.  Patient was then rotated back to the preoperative bed awakened from anesthesia and taken to recovery all counts are correct in this case.   I performed the entire procedure with the assistance of Cooper Render PA as an Pensions consultant. An assistant was required for this procedure due to the complexity.  The assistant provided assistance in tissue manipulation and suction, and was required for the successful and safe performance of the procedure. I performed the critical portions of the procedure.   Meade Maw MD

## 2022-06-15 NOTE — Transfer of Care (Signed)
Immediate Anesthesia Transfer of Care Note  Patient: Marie Mills  Procedure(s) Performed: LEFT L3-4 MICRODISCECTOMY (Left: Spine Lumbar)  Patient Location: PACU  Anesthesia Type:General  Level of Consciousness: drowsy  Airway & Oxygen Therapy: Patient Spontanous Breathing and Patient connected to face mask oxygen  Post-op Assessment: Report given to RN and Post -op Vital signs reviewed and stable  Post vital signs: Reviewed and stable  Last Vitals:  Vitals Value Taken Time  BP 93/54 06/15/22 0941  Temp    Pulse 87 06/15/22 0943  Resp 31 06/15/22 0943  SpO2 97 % 06/15/22 0943  Vitals shown include unvalidated device data.  Last Pain:  Vitals:   06/15/22 0631  TempSrc: Temporal  PainSc: 0-No pain         Complications: No notable events documented.

## 2022-06-16 ENCOUNTER — Encounter: Payer: Self-pay | Admitting: Neurosurgery

## 2022-06-18 LAB — ABO/RH: ABO/RH(D): O POS

## 2022-06-23 ENCOUNTER — Encounter: Payer: Self-pay | Admitting: Neurosurgery

## 2022-06-23 ENCOUNTER — Telehealth: Payer: Self-pay

## 2022-06-23 ENCOUNTER — Telehealth: Payer: Self-pay | Admitting: Orthopedic Surgery

## 2022-06-23 ENCOUNTER — Ambulatory Visit: Payer: Medicaid Other | Admitting: Orthopedic Surgery

## 2022-06-23 DIAGNOSIS — Z9889 Other specified postprocedural states: Secondary | ICD-10-CM

## 2022-06-23 DIAGNOSIS — M5416 Radiculopathy, lumbar region: Secondary | ICD-10-CM

## 2022-06-23 MED ORDER — CLINDAMYCIN HCL 150 MG PO CAPS: 150.0000 mg | ORAL_CAPSULE | Freq: Three times a day (TID) | ORAL | 0 refills | Status: DC

## 2022-06-23 NOTE — Telephone Encounter (Signed)
I spoke with Ms Sekelsky about Stacy's recommendations. She verbalized understanding. I confirmed that she is aware of how to page the neurosurgeon on call for urgent matters after hours.

## 2022-06-23 NOTE — Telephone Encounter (Signed)
-----   Message from Peggyann Shoals sent at 06/23/2022  1:06 PM EST ----- Regarding: postop drainage Contact: [phone] Left L3-4 microdiscectomy on 06/15/22 Patient calling that earlier today she was standing up when she felt her shirt and pants were soak and wet. Her incision is draining. It's clear drainage. No other symptoms. She will try and send a picture through mychart. Should she be concerned?

## 2022-06-23 NOTE — Telephone Encounter (Signed)
See telephone call

## 2022-06-23 NOTE — Telephone Encounter (Signed)
Reviewed with Dr. Izora Ribas.   She should cover with dry dressing and change daily.   The incision does not look infected, but we don't want it to get infected.   I sent an antibiotic to her pharmacy (clindamycin) to take for a week.   Call if any increased pain, redness, fever, or chills.   Keep scheduled follow up next week.

## 2022-06-27 ENCOUNTER — Other Ambulatory Visit: Payer: Self-pay

## 2022-06-27 ENCOUNTER — Inpatient Hospital Stay
Admission: EM | Admit: 2022-06-27 | Discharge: 2022-07-07 | DRG: 856 | Disposition: A | Discharge status: Home Health | Payer: Medicaid Other | Attending: Student | Admitting: Student

## 2022-06-27 ENCOUNTER — Ambulatory Visit (INDEPENDENT_AMBULATORY_CARE_PROVIDER_SITE_OTHER): Payer: Medicaid Other | Admitting: Orthopedic Surgery

## 2022-06-27 ENCOUNTER — Emergency Department: Payer: Medicaid Other

## 2022-06-27 DIAGNOSIS — K219 Gastro-esophageal reflux disease without esophagitis: Secondary | ICD-10-CM | POA: Diagnosis present

## 2022-06-27 DIAGNOSIS — Z8249 Family history of ischemic heart disease and other diseases of the circulatory system: Secondary | ICD-10-CM

## 2022-06-27 DIAGNOSIS — Z8614 Personal history of Methicillin resistant Staphylococcus aureus infection: Secondary | ICD-10-CM

## 2022-06-27 DIAGNOSIS — G8929 Other chronic pain: Secondary | ICD-10-CM | POA: Diagnosis not present

## 2022-06-27 DIAGNOSIS — M25561 Pain in right knee: Secondary | ICD-10-CM

## 2022-06-27 DIAGNOSIS — Z9889 Other specified postprocedural states: Secondary | ICD-10-CM

## 2022-06-27 DIAGNOSIS — Z1624 Resistance to multiple antibiotics: Secondary | ICD-10-CM | POA: Diagnosis present

## 2022-06-27 DIAGNOSIS — Z9049 Acquired absence of other specified parts of digestive tract: Secondary | ICD-10-CM

## 2022-06-27 DIAGNOSIS — M4626 Osteomyelitis of vertebra, lumbar region: Secondary | ICD-10-CM

## 2022-06-27 DIAGNOSIS — G062 Extradural and subdural abscess, unspecified: Secondary | ICD-10-CM

## 2022-06-27 DIAGNOSIS — M542 Cervicalgia: Secondary | ICD-10-CM | POA: Diagnosis not present

## 2022-06-27 DIAGNOSIS — B961 Klebsiella pneumoniae [K. pneumoniae] as the cause of diseases classified elsewhere: Secondary | ICD-10-CM | POA: Diagnosis present

## 2022-06-27 DIAGNOSIS — T8149XA Infection following a procedure, other surgical site, initial encounter: Secondary | ICD-10-CM

## 2022-06-27 DIAGNOSIS — B9620 Unspecified Escherichia coli [E. coli] as the cause of diseases classified elsewhere: Secondary | ICD-10-CM | POA: Diagnosis present

## 2022-06-27 DIAGNOSIS — Z79899 Other long term (current) drug therapy: Secondary | ICD-10-CM

## 2022-06-27 DIAGNOSIS — M5416 Radiculopathy, lumbar region: Secondary | ICD-10-CM | POA: Diagnosis present

## 2022-06-27 DIAGNOSIS — Z5986 Financial insecurity: Secondary | ICD-10-CM

## 2022-06-27 DIAGNOSIS — Z825 Family history of asthma and other chronic lower respiratory diseases: Secondary | ICD-10-CM

## 2022-06-27 DIAGNOSIS — M48061 Spinal stenosis, lumbar region without neurogenic claudication: Secondary | ICD-10-CM

## 2022-06-27 DIAGNOSIS — Z882 Allergy status to sulfonamides status: Secondary | ICD-10-CM

## 2022-06-27 DIAGNOSIS — G9763 Postprocedural seroma of a nervous system organ or structure following a nervous system procedure: Secondary | ICD-10-CM

## 2022-06-27 DIAGNOSIS — Z792 Long term (current) use of antibiotics: Secondary | ICD-10-CM

## 2022-06-27 DIAGNOSIS — J45909 Unspecified asthma, uncomplicated: Secondary | ICD-10-CM | POA: Insufficient documentation

## 2022-06-27 DIAGNOSIS — E872 Acidosis, unspecified: Secondary | ICD-10-CM | POA: Diagnosis not present

## 2022-06-27 DIAGNOSIS — R197 Diarrhea, unspecified: Secondary | ICD-10-CM | POA: Diagnosis not present

## 2022-06-27 DIAGNOSIS — Z880 Allergy status to penicillin: Secondary | ICD-10-CM

## 2022-06-27 DIAGNOSIS — D649 Anemia, unspecified: Secondary | ICD-10-CM | POA: Diagnosis not present

## 2022-06-27 DIAGNOSIS — M4646 Discitis, unspecified, lumbar region: Secondary | ICD-10-CM | POA: Diagnosis not present

## 2022-06-27 DIAGNOSIS — G43909 Migraine, unspecified, not intractable, without status migrainosus: Secondary | ICD-10-CM | POA: Diagnosis present

## 2022-06-27 DIAGNOSIS — I10 Essential (primary) hypertension: Secondary | ICD-10-CM | POA: Diagnosis present

## 2022-06-27 DIAGNOSIS — Z8719 Personal history of other diseases of the digestive system: Secondary | ICD-10-CM

## 2022-06-27 DIAGNOSIS — E876 Hypokalemia: Secondary | ICD-10-CM | POA: Diagnosis not present

## 2022-06-27 DIAGNOSIS — Z7951 Long term (current) use of inhaled steroids: Secondary | ICD-10-CM

## 2022-06-27 DIAGNOSIS — R000 Tachycardia, unspecified: Secondary | ICD-10-CM | POA: Diagnosis not present

## 2022-06-27 DIAGNOSIS — T8141XA Infection following a procedure, superficial incisional surgical site, initial encounter: Principal | ICD-10-CM | POA: Diagnosis present

## 2022-06-27 DIAGNOSIS — Z6841 Body Mass Index (BMI) 40.0 and over, adult: Secondary | ICD-10-CM

## 2022-06-27 LAB — COMPREHENSIVE METABOLIC PANEL
ALT: 19 U/L (ref 0–44)
AST: 23 U/L (ref 15–41)
Albumin: 3.6 g/dL (ref 3.5–5.0)
Alkaline Phosphatase: 79 U/L (ref 38–126)
Anion gap: 8 (ref 5–15)
BUN: 18 mg/dL (ref 6–20)
CO2: 20 mmol/L — ABNORMAL LOW (ref 22–32)
Calcium: 8.6 mg/dL — ABNORMAL LOW (ref 8.9–10.3)
Chloride: 110 mmol/L (ref 98–111)
Creatinine, Ser: 0.77 mg/dL (ref 0.44–1.00)
GFR, Estimated: >60 mL/min (ref >60)
Glucose, Bld: 86 mg/dL (ref 70–99)
Potassium: 3.6 mmol/L (ref 3.5–5.1)
Sodium: 138 mmol/L (ref 135–145)
Total Bilirubin: 0.6 mg/dL (ref 0.3–1.2)
Total Protein: 7 g/dL (ref 6.5–8.1)

## 2022-06-27 LAB — CBC WITH DIFFERENTIAL/PLATELET
Abs Immature Granulocytes: 0.04 10*3/uL (ref 0.00–0.07)
Basophils Absolute: 0 10*3/uL (ref 0.0–0.1)
Basophils Relative: 1 %
Eosinophils Absolute: 0.2 10*3/uL (ref 0.0–0.5)
Eosinophils Relative: 2 %
HCT: 36.8 % (ref 36.0–46.0)
Hemoglobin: 11.8 g/dL — ABNORMAL LOW (ref 12.0–15.0)
Immature Granulocytes: 1 %
Lymphocytes Relative: 28 %
Lymphs Abs: 2.5 10*3/uL (ref 0.7–4.0)
MCH: 27.8 pg (ref 26.0–34.0)
MCHC: 32.1 g/dL (ref 30.0–36.0)
MCV: 86.8 fL (ref 80.0–100.0)
Monocytes Absolute: 0.6 10*3/uL (ref 0.1–1.0)
Monocytes Relative: 6 %
Neutro Abs: 5.5 10*3/uL (ref 1.7–7.7)
Neutrophils Relative %: 62 %
Platelets: 288 10*3/uL (ref 150–400)
RBC: 4.24 MIL/uL (ref 3.87–5.11)
RDW: 14.3 % (ref 11.5–15.5)
WBC: 8.8 10*3/uL (ref 4.0–10.5)
nRBC: 0 % (ref 0.0–0.2)

## 2022-06-27 MED ORDER — DEXAMETHASONE SODIUM PHOSPHATE 10 MG/ML IJ SOLN
10.0000 mg | Freq: Once | INTRAMUSCULAR | Status: AC
  Administered 2022-06-27: 10 mg via INTRAVENOUS

## 2022-06-27 MED ORDER — HYDROMORPHONE HCL 1 MG/ML IJ SOLN
1.0000 mg | Freq: Once | INTRAMUSCULAR | Status: AC
  Administered 2022-06-27: 1 mg via INTRAVENOUS
  Filled 2022-06-27: qty 1

## 2022-06-27 MED ORDER — GADOBUTROL 1 MMOL/ML IV SOLN
10.0000 mL | Freq: Once | INTRAVENOUS | Status: AC | PRN
  Administered 2022-06-27: 10 mL via INTRAVENOUS

## 2022-06-27 MED ORDER — DEXAMETHASONE SODIUM PHOSPHATE 10 MG/ML IJ SOLN
10.0000 mg | Freq: Once | INTRAMUSCULAR | Status: DC
  Filled 2022-06-27: qty 1

## 2022-06-27 MED ORDER — ONDANSETRON HCL 4 MG/2ML IJ SOLN
4.0000 mg | Freq: Once | INTRAMUSCULAR | Status: AC
  Administered 2022-06-27: 4 mg via INTRAVENOUS
  Filled 2022-06-27: qty 2

## 2022-06-27 MED ORDER — LIDOCAINE-EPINEPHRINE (PF) 1 %-1:200000 IJ SOLN
10.0000 mL | Freq: Once | INTRAMUSCULAR | Status: AC
  Administered 2022-06-28: 10 mL
  Filled 2022-06-27: qty 10

## 2022-06-27 MED ORDER — MORPHINE SULFATE (PF) 4 MG/ML IV SOLN
4.0000 mg | Freq: Once | INTRAVENOUS | Status: AC
  Administered 2022-06-27: 4 mg via INTRAVENOUS
  Filled 2022-06-27: qty 1

## 2022-06-27 MED ORDER — METHYLPREDNISOLONE 4 MG PO TBPK: ORAL_TABLET | ORAL | 0 refills | Status: DC

## 2022-06-27 NOTE — Telephone Encounter (Signed)
Please call her and get some more info- any fevers or chills?   Is drainage still clear? What does it look like?  Please have her send [place] a new picture.

## 2022-06-27 NOTE — Progress Notes (Deleted)
   REFERRING PHYSICIAN:  Neale Burly, [place],  [place] [id number]  DOS: 06/15/22  Left L3-L4 microdiscectomy   HISTORY OF PRESENT ILLNESS: Marie Mills is approximately 2 weeks status post Left L3-L4 microdiscectomy. Was given robaxin and oxycodone on discharge from the hospital.   She called on Thursday with clear drainage from incision. She was started on clindamycin.   She called yesterday- incision still draining and saturating dressings after an hour. Also with increased left sided back/buttock pain in posterior thigh to knee.      PHYSICAL EXAMINATION:  General: Patient is well developed, well nourished, calm, collected, and in no apparent distress.   NEUROLOGICAL:  General: In no acute distress.   Awake, alert, oriented to person, place, and time.  Pupils equal round and reactive to light.  Facial tone is symmetric.     Strength:            Side Iliopsoas Quads Hamstring PF DF EHL  R 5 5 5 5 5 5   L 5 5 5 5 5 5    Incision c/d/i   ROS (Neurologic):  Negative except as noted above  IMAGING: Nothing new to review.   ASSESSMENT/PLAN:  Marie Mills is doing well s/p above surgery. Treatment options reviewed with patient and following plan made:   - I have advised the patient to lift up to 10 pounds until 6 weeks after surgery (follow up with Dr. Izora Ribas).  - Reviewed wound care.  - No bending, twisting, or lifting.  - Continue on current medications including ***.  - Follow up as scheduled in 4 weeks and prn.   Advised to contact the office if any questions or concerns arise.  Geronimo Boot PA-C Department of neurosurgery

## 2022-06-27 NOTE — Telephone Encounter (Signed)
Patient confirmed appt for tomorrow at 2:30pm.

## 2022-06-27 NOTE — Telephone Encounter (Signed)
Reviewed with Dr. Izora Ribas. He wants her to see me in clinic tomorrow for evaluation. Can put her in any open slot. Please call, let her know, and schedule follow up.

## 2022-06-27 NOTE — Telephone Encounter (Signed)
See telephone call

## 2022-06-27 NOTE — Progress Notes (Signed)
Chief Complaint  Patient presents with   Injections    Wants injection right knee/ states she is going to go to hospital today after her back surgery due to increased drainage after surgery for lumbar spine    Marie Mills has had recent disc surgery in [place].  She says she has had excessive drainage and is heading to the hospital.  I did check the wound.  There is a small wound on the lower lumbar area.  I did not detect any drainage but she said she just changed the dressing and is a changing it like every hour.  I did not see a leak.  There is no erythema.  Patient has no fever  She does indicate some left lower extremity pain and peroneal pain as well.  The knee looks amenable to injection there is no contraindication to the intra-articular injection of steroid into the right knee  Encounter Diagnosis  Name Primary?   Chronic pain of right knee Yes    Procedure note right knee injection   verbal consent was obtained to inject right knee joint  Timeout was completed to confirm the site of injection  The medications used were depomedrol 40 mg and 1% lidocaine 3 cc Anesthesia was provided by ethyl chloride and the skin was prepped with alcohol.  After cleaning the skin with alcohol a 20-gauge needle was used to inject the right knee joint. There were no complications. A sterile bandage was applied.

## 2022-06-27 NOTE — ED Triage Notes (Signed)
C/O left lower back pain onset last night..  Has had back surgery 06/16/2022.  C/O left lower back radiating down left buttock and left labial numbness.  Also c/o leaking from incision.  Taking antibiotics since last week.

## 2022-06-27 NOTE — Telephone Encounter (Signed)
Per her last message, she was not taking oxycodone. Have her restart this and take as directed (every 4 hours as needed) for pain.

## 2022-06-27 NOTE — ED Provider Notes (Signed)
[facility] Provider Note  Patient Contact: 9:56 PM (approximate)   History   No chief complaint on file.   HPI  Marie Mills is a 52 y.o. female who presents emergency department with some drainage from her back, pain in her left groin and thigh.  Patient had microdiscectomy performed by neurosurgery on 06/15/2022.  Reviewed patient's medical records and procedure went well according to notes.  Patient been doing very well until she started to have some mild drainage from her surgical site.  Has had no fever, chills.  There is no described erythema or edema of the skin.  Patient has been on an antibiotic to ensure there was no surgical site infection and she is on her last day of antibiotics.  She is started to develop some increasing pain that she describes in her left labia extending into her left thigh.  She also endorses numbness to this area.  No pain or numbness down the left leg.  There is no symptoms crossing midline.  She states that left labia is both painful and numb but right labia is not.     Physical Exam   Triage Vital Signs: ED Triage Vitals  Enc Vitals Group     BP 06/27/22 2047 (!) 154/76     Pulse Rate 06/27/22 2047 93     Resp 06/27/22 2047 17     Temp 06/27/22 2047 98.4 F (36.9 C)     Temp Source 06/27/22 2047 Oral     SpO2 06/27/22 2047 95 %     Weight 06/27/22 1842 273 lb 5.9 oz (124 kg)     Height 06/27/22 1842 '5\' 6"'$  (1.676 m)     Head Circumference --      Peak Flow --      Pain Score 06/27/22 1841 7     Pain Loc --      Pain Edu? --      Excl. in [place]? --     Most recent vital signs: Vitals:   06/27/22 2047  BP: (!) 154/76  Pulse: 93  Resp: 17  Temp: 98.4 F (36.9 C)  SpO2: 95%     General: Alert and in no acute distress.  Cardiovascular:  Good peripheral perfusion Respiratory: Normal respiratory effort without tachypnea or retractions. Lungs CTAB.  Musculoskeletal: Full range of motion to all extremities.   Visualization of the patient's back reveals surgical scar.  There is 1 small area that appears to have some serosanguineous drainage crusted around.  While there is slight opening of the skin in this area there is no true dehiscence of this wound.  Palpation does not elicit any drainage.  There is no overlying erythema or edema.  Palpation through the spine reveals no significant tenderness.  She does have a little sciatic notch tenderness on the left side.  She reports decreased sensation in the left groin extending into the left thigh but sensation is still there.  It is decreased from 1 right where there is no decrease in sensation.  Pulses sensation intact in the left foot with no deficits. Neurologic:  No gross focal neurologic deficits are appreciated.  Skin:   No rash noted Other:   ED Results / Procedures / Treatments   Labs (all labs ordered are listed, but only abnormal results are displayed) Labs Reviewed  CBC WITH DIFFERENTIAL/PLATELET - Abnormal; Notable for the following components:      Result Value   Hemoglobin 11.8 (*)    All  other components within normal limits  COMPREHENSIVE METABOLIC PANEL - Abnormal; Notable for the following components:   CO2 20 (*)    Calcium 8.6 (*)    All other components within normal limits  CULTURE, BLOOD (ROUTINE X 2)  CULTURE, BLOOD (ROUTINE X 2)  SEDIMENTATION RATE  C-REACTIVE PROTEIN     EKG     RADIOLOGY  I personally viewed, evaluated, and interpreted these images as part of my medical decision making, as well as reviewing the written report by the radiologist.  ED Provider Interpretation: Discussed with on-call neurosurgeon the patient's MRI results.  There appears to be some local subcutaneous necrosis.  No evidence of stranding concerning for cellulitis or abscess.  Patient has no recurrence of disc injury in her lumbar spine.  There is no obvious compression of central cord or peripheral nerves concerning for cauda equina.   Neurosurgery feels that symptoms will likely resolve on their own.  MR Lumbar Spine W Wo Contrast  Result Date: 06/27/2022 CLINICAL DATA:  Postop, increasing pain, drainage from surgical site EXAM: MRI LUMBAR SPINE WITHOUT AND WITH CONTRAST TECHNIQUE: Multiplanar and multiecho pulse sequences of the lumbar spine were obtained without and with intravenous contrast. CONTRAST:  67m GADAVIST GADOBUTROL 1 MMOL/ML IV SOLN COMPARISON:  02/03/2022 FINDINGS: Segmentation:  Standard. Alignment: No listhesis. Minimal dextrocurvature. Straightening of the normal lumbar lordosis. Vertebrae: No acute fracture or suspicious osseous lesion. Status post L3-L4 left hemilaminotomy and microdiscectomy. Contrast enhancement is noted at the posterior aspect of the L4-L5 disc (series 10, images 8-10). No definite osseous enhancement. Conus medullaris and cauda equina: Conus extends to the L1 level. Conus and cauda equina appear normal. No abnormal nerve root enhancement. Along the dorsal aspect of the thecal sac, there is possible fluid collection that causes mass effect on the thecal sac, which follow CSF on most sequences and may represent a CSF leak (series 8, image 8). This extends along the dorsal aspect of the thecal sac from approximately T12-L1 through L3-L4. Paraspinal and other soft tissues: Postsurgical changes posterior to L3 and L4, with a fluid collection extending from the skin surface to the posterior, left, and anterior aspect of the thecal sac at the L3-L4 disc level (series 8, image 26), measuring approximately 8.3 x 1.8 x 2.1 cm (AP x TR x CC) (series 8, image 24 and series 5, image 10). Contrast enhancement is noted along this tract (series 10, image 10, as well as along the left aspect of the L3 and L4 spinous process (series 11, images 26-27). Increased T2 signal and contrast enhancement is also noted in the interspinous ligaments at L2-L3, L3-L4, and L4-L5 (series 7, image 9 and series 10, image 9) and in the  bilateral L3-L4 facets (series 10, image 6 and 12 and series 7, image 6 and 12). Disc levels: T12-L1: No significant disc bulge. No spinal canal stenosis or neural foraminal narrowing. L1-L2: No significant disc bulge. There is moderate thecal sac narrowing, secondary to a possible dorsal fluid collection (series 8, image 8). No neural foraminal narrowing. L2-L3: Mild disc bulge. Moderate thecal sac narrowing secondary to a dorsal fluid collection. No neural foraminal narrowing. L3-L4: Status post interval micro discectomy, with no residual disc protrusion/extrusion seen. Bilateral facet effusions. Fluid is noted extending along of the posterior, left, and anterior aspect of the thecal sac, contributing to moderate compression (series 8, image 25). No neural foraminal narrowing. L4-L5: Small central disc protrusion. Mild facet arthropathy. No spinal canal stenosis or neural foraminal narrowing.  L5-S1: No significant disc bulge. No spinal canal stenosis or neural foraminal narrowing. IMPRESSION: 1. Status post L3-L4 left hemilaminotomy and microdiscectomy, with a fluid collection extending from the skin surface to the posterior and anterior aspect of the thecal sac at L3-L4, causing moderate thecal sac narrowing. The collection is associated with increased T2 signal and contrast enhancement and may represent a postoperative seroma, but an infected collection cannot be excluded. 2. Increased T2 signal and contrast enhancement in the interspinous ligaments at L2-L3, L3-L4, and L4-L5, as well as in the bilateral L3-L4 facets, concerning for infection, although this could also be post-surgical. 3. Contrast enhancement at the posterior aspect of the L4-L5 disc, concerning for discitis. 4. Possible epidural fluid collection along the dorsal aspect of the thecal sac from T12-L1 through L3-L4, possibly a CSF leak, which causes additional moderate thecal sac narrowing at L1-L2 and L2-L3. These results were called by telephone  at the time of interpretation on 06/27/2022 at 11:53 pm to provider [facility] , who verbally acknowledged these results. Electronically Signed   By: Merilyn Baba M.D.   On: 06/27/2022 23:54    PROCEDURES:  Critical Care performed: No  ..Laceration Repair  Date/Time: 06/28/2022 12:34 AM  Performed by: Darletta Moll, PA-C Authorized by: Darletta Moll, PA-C   Consent:    Consent obtained:  Verbal   Consent given by:  Patient   Risks discussed:  Infection, pain and need for additional repair Universal protocol:    Procedure explained and questions answered to patient or proxy's satisfaction: yes     Immediately prior to procedure, a time out was called: yes     Patient identity confirmed:  Verbally with patient Anesthesia:    Anesthesia method:  Local infiltration   Local anesthetic:  Lidocaine 1% WITH epi Laceration details:    Location:  Trunk   Trunk location:  Lower back   Length (cm):  4 Exploration:    Contaminated: no   Treatment:    Area cleansed with:  Povidone-iodine and saline   Amount of cleaning:  Extensive   Irrigation volume:  1L Skin repair:    Repair method:  Sutures   Suture size:  4-0   Suture material:  Nylon   Suture technique:  Running locked   Number of sutures:  1 (1 running interlock suture with 9 throws) Approximation:    Approximation:  Close Repair type:    Repair type:  Simple Post-procedure details:    Dressing:  Open (no dressing)   Procedure completion:  Tolerated well, no immediate complications Comments:     Patient with surgical site noted to lumbar spine.  At the recommendation of neurosurgery, he recommends placing external sutures over this area.  Running interlock suture placed as described above.  Patient tolerated well. .Critical Care E&M  Performed by: Darletta Moll, PA-C Critical care provider statement:    Critical care time (minutes):  79   Critical care time was exclusive of:  Separately  billable procedures and treating other patients and teaching time   Critical care was necessary to treat or prevent imminent or life-threatening deterioration of the following conditions:  CNS failure or compromise   Critical care was time spent personally by me on the following activities:  Obtaining history from patient or surrogate, examination of patient, evaluation of patient's response to treatment, discussions with consultants, development of treatment plan with patient or surrogate, ordering and performing treatments and interventions, ordering and review of laboratory studies, ordering  and review of radiographic studies, re-evaluation of patient's condition and review of old charts   I assumed direction of critical care for this patient from another provider in my specialty: no     Care discussed with: admitting provider   After initial E/M assessment, critical care services were subsequently performed that were exclusive of separately billable procedures or treatment.   Comments:     Patient with postsurgical neurodeficit.  Large fluid collection likely seroma identified on imaging and physical exam.  Patient also has findings consistent with discitis.  Multiple conversations with neurosurgery as well as admitting specialist.    [place] ED: Medications  ondansetron (ZOFRAN) injection 4 mg (4 mg Intravenous Given 06/27/22 2159)  morphine (PF) 4 MG/ML injection 4 mg (4 mg Intravenous Given 06/27/22 2200)  gadobutrol (GADAVIST) 1 MMOL/ML injection 10 mL (10 mLs Intravenous Contrast Given 06/27/22 2249)  lidocaine-EPINEPHrine (PF) (XYLOCAINE-EPINEPHrine) 1 %-1:200000 (PF) injection 10 mL (10 mLs Infiltration Given by Other 06/28/22 0031)  HYDROmorphone (DILAUDID) injection 1 mg (1 mg Intravenous Given 06/27/22 2330)  dexamethasone (DECADRON) injection 10 mg (10 mg Intravenous Given 06/27/22 2330)     IMPRESSION / MDM / ASSESSMENT AND PLAN / ED COURSE  I reviewed the triage  vital signs and the nursing notes.                              Clinical Course as of 06/28/22 0040  Tue Jun 28, 2022  0032 Before suturing surgical site, and at neurosurgery's recommendation, received a phone call from radiology regarding the MRI.  There is a fluid collection which is felt to be postsurgical, not infectious.  There is no purulence, and has been draining a serosanguineous fluid from the beginning.  This is still felt to be a seroma.  Patient had a fluid collection noted by radiologist possibly to be a CSF leak, however there was no CSF leak during operation and neurosurgery does not feel this collection is likely CSF.  However there is a finding at the level below the surgical site concerning for discitis.  Neurosurgery is also concerned at this time and recommends admission to medicine, ESR, CRP, blood cultures and biopsy performed by IR.  Will contact the hospitalist team for admission. [JC]    Clinical Course User Index [JC] Dutchess Crosland, Charline Bills, PA-C    Differential diagnosis includes, but is not limited to, surgical site infection, abscess at surgical site, cauda equina, reoccurrence of disc injury, foraminal outlet stenosis  Patient's presentation is most consistent with acute presentation with potential threat to life or bodily function.   Patient's diagnosis is consistent with discitis.  Patient presents emergency department with increased back pain, numbness in the left groin and left thigh.  Patient had a microdiscectomy performed by neurosurgery on 02/28.  She been doing well.  She has had some fluid drainage from the surgical site consistent with a draining seroma.  There is been no evidence of infection or purulence draining from this area.  Given the neurochanges, MRI was undertaken.  I reached out to neurosurgeon prior to ordering MRI to ensure appropriate imaging was performed.  Patient has MRI lumbar spine with contrast.  There is a fluid collection noted which  again neurosurgery feels is likely a seroma.  There was some findings extra fecal that was concerning for possible CSF fluid collection.  Neurosurgery advises there was no CSF leak and does not feel that this is  likely a CSF fluid collection.  Patient does have findings concerning for discitis on MRI.  Given this finding neurosurgery recommends admission.  Labs have been ordered, ESR, CRP and blood cultures have been additionally ordered at recommendation of neurosurgeon.  Will contact the hospitalist for admission and IR will biopsy disc for further evaluation.  Neurosurgery is aware of the patient and will follow while they are admitted.  Patient care is transferred to the hospitalist team at this time.Marland Kitchen    FINAL CLINICAL IMPRESSION(S) / ED DIAGNOSES   Final diagnoses:  Lumbar radiculopathy     Rx / DC Orders   ED Discharge Orders          Ordered    methylPREDNISolone (MEDROL DOSEPAK) 4 MG TBPK tablet        06/27/22 2307             Note:  This document was prepared using Dragon voice recognition software and may include unintentional dictation errors.   Darletta Moll, PA-C 06/28/22 0040    Naaman Plummer, MD 06/29/22 0002

## 2022-06-27 NOTE — Telephone Encounter (Signed)
*  Also refer to telephone call from 06/27/22.  I spoke with Marie Mills and advised her that Marie Mills suggested she restart the oxycodone, as she had not tried this since these symptoms started last night.   Per Marie Mills: "medrol as well unless she has new weakness or symptoms of cauda equina, in which case she should go to ER. if she can sense ability to urinate and is going normally, then she can wait until tomorrow."  I discussed the above with Marie Mills, to which she said "my left leg is not wanting to pick up again". She also informed me that she can tell when she has to go, but when she wipes she can't feel it. Per discussion with Marie Mills, I advised her to proceed to the ER.

## 2022-06-27 NOTE — Telephone Encounter (Signed)
I spoke with Marie Mills.   She denies fever/chills, headaches, back pain, weakness, incontinence. She states her daughter has been looking at the incision and reports the incision looks about the same. The drainage is still clear, but she is having to change the bandage about every hour b/c it is getting soaked. She is using 3 gauze pads and tape for a bandage.   Since last night, she is also having numbness and pain in her left butt cheek and posterior thigh to her knee. She also reports numbness in her private area since last night and states she cannot feel when she wipes. She denies incontinence, and states can tell when she needs to go to the bathroom.   She is taking oxycodone '5mg'$  as needed, but states she hasn't really needed to take it. She is alternating methocarbamol '750mg'$  and tizanidine (she was taking this prior to surgery for her knee). She is also taking gabapentin '300mg'$  TID. She is still taking the clindamycin.   She is going to try to send an updated picture.

## 2022-06-27 NOTE — Addendum Note (Signed)
Addended by: Berdine Addison on: 06/27/2022 10:45 AM   Modules accepted: Orders

## 2022-06-27 NOTE — ED Notes (Signed)
See triage note. Pt started with lower back pain yesterday. Pt has L leg numbness going down buttocks from hip to knee. Had back surgery a couple weeks ago and is still leaking from incision in back.

## 2022-06-27 NOTE — Telephone Encounter (Signed)
Pt called in this morning stating that the leaking from her incision is getting worse and she is changing out her bandaging atleast every hour. Also she has started to get some pain and numbness on her left side starting in the buttocks and radiating down her leg.  CB: [phone]

## 2022-06-28 ENCOUNTER — Encounter: Payer: Self-pay | Admitting: Family Medicine

## 2022-06-28 ENCOUNTER — Encounter: Payer: Medicaid Other | Admitting: Orthopedic Surgery

## 2022-06-28 DIAGNOSIS — Z6841 Body Mass Index (BMI) 40.0 and over, adult: Secondary | ICD-10-CM | POA: Diagnosis not present

## 2022-06-28 DIAGNOSIS — K219 Gastro-esophageal reflux disease without esophagitis: Secondary | ICD-10-CM | POA: Diagnosis present

## 2022-06-28 DIAGNOSIS — J4520 Mild intermittent asthma, uncomplicated: Secondary | ICD-10-CM

## 2022-06-28 DIAGNOSIS — M5416 Radiculopathy, lumbar region: Secondary | ICD-10-CM | POA: Diagnosis present

## 2022-06-28 DIAGNOSIS — Z9889 Other specified postprocedural states: Secondary | ICD-10-CM

## 2022-06-28 DIAGNOSIS — Z5986 Financial insecurity: Secondary | ICD-10-CM | POA: Diagnosis not present

## 2022-06-28 DIAGNOSIS — I10 Essential (primary) hypertension: Secondary | ICD-10-CM | POA: Diagnosis not present

## 2022-06-28 DIAGNOSIS — G9763 Postprocedural seroma of a nervous system organ or structure following a nervous system procedure: Secondary | ICD-10-CM

## 2022-06-28 DIAGNOSIS — E876 Hypokalemia: Secondary | ICD-10-CM | POA: Diagnosis not present

## 2022-06-28 DIAGNOSIS — M4646 Discitis, unspecified, lumbar region: Secondary | ICD-10-CM | POA: Diagnosis present

## 2022-06-28 DIAGNOSIS — B961 Klebsiella pneumoniae [K. pneumoniae] as the cause of diseases classified elsewhere: Secondary | ICD-10-CM | POA: Diagnosis present

## 2022-06-28 DIAGNOSIS — M79605 Pain in left leg: Secondary | ICD-10-CM | POA: Diagnosis not present

## 2022-06-28 DIAGNOSIS — Z9049 Acquired absence of other specified parts of digestive tract: Secondary | ICD-10-CM | POA: Diagnosis not present

## 2022-06-28 DIAGNOSIS — J45909 Unspecified asthma, uncomplicated: Secondary | ICD-10-CM | POA: Insufficient documentation

## 2022-06-28 DIAGNOSIS — Z825 Family history of asthma and other chronic lower respiratory diseases: Secondary | ICD-10-CM | POA: Diagnosis not present

## 2022-06-28 DIAGNOSIS — Z8614 Personal history of Methicillin resistant Staphylococcus aureus infection: Secondary | ICD-10-CM | POA: Diagnosis not present

## 2022-06-28 DIAGNOSIS — Z1624 Resistance to multiple antibiotics: Secondary | ICD-10-CM | POA: Diagnosis present

## 2022-06-28 DIAGNOSIS — Z79899 Other long term (current) drug therapy: Secondary | ICD-10-CM | POA: Diagnosis not present

## 2022-06-28 DIAGNOSIS — Z882 Allergy status to sulfonamides status: Secondary | ICD-10-CM | POA: Diagnosis not present

## 2022-06-28 DIAGNOSIS — G062 Extradural and subdural abscess, unspecified: Secondary | ICD-10-CM | POA: Diagnosis present

## 2022-06-28 DIAGNOSIS — Z7951 Long term (current) use of inhaled steroids: Secondary | ICD-10-CM | POA: Diagnosis not present

## 2022-06-28 DIAGNOSIS — B9620 Unspecified Escherichia coli [E. coli] as the cause of diseases classified elsewhere: Secondary | ICD-10-CM | POA: Diagnosis present

## 2022-06-28 DIAGNOSIS — E872 Acidosis, unspecified: Secondary | ICD-10-CM | POA: Diagnosis not present

## 2022-06-28 DIAGNOSIS — T8149XA Infection following a procedure, other surgical site, initial encounter: Secondary | ICD-10-CM | POA: Diagnosis not present

## 2022-06-28 DIAGNOSIS — M4626 Osteomyelitis of vertebra, lumbar region: Secondary | ICD-10-CM | POA: Diagnosis not present

## 2022-06-28 DIAGNOSIS — Z880 Allergy status to penicillin: Secondary | ICD-10-CM | POA: Diagnosis not present

## 2022-06-28 DIAGNOSIS — T8141XA Infection following a procedure, superficial incisional surgical site, initial encounter: Secondary | ICD-10-CM | POA: Diagnosis present

## 2022-06-28 DIAGNOSIS — R202 Paresthesia of skin: Secondary | ICD-10-CM | POA: Diagnosis not present

## 2022-06-28 DIAGNOSIS — Z8249 Family history of ischemic heart disease and other diseases of the circulatory system: Secondary | ICD-10-CM | POA: Diagnosis not present

## 2022-06-28 DIAGNOSIS — G43909 Migraine, unspecified, not intractable, without status migrainosus: Secondary | ICD-10-CM | POA: Diagnosis present

## 2022-06-28 LAB — CBC
HCT: 38.3 % (ref 36.0–46.0)
Hemoglobin: 12.1 g/dL (ref 12.0–15.0)
MCH: 28.2 pg (ref 26.0–34.0)
MCHC: 31.6 g/dL (ref 30.0–36.0)
MCV: 89.3 fL (ref 80.0–100.0)
Platelets: 248 10*3/uL (ref 150–400)
RBC: 4.29 MIL/uL (ref 3.87–5.11)
RDW: 14.2 % (ref 11.5–15.5)
WBC: 10.7 10*3/uL — ABNORMAL HIGH (ref 4.0–10.5)
nRBC: 0 % (ref 0.0–0.2)

## 2022-06-28 LAB — HIV ANTIBODY (ROUTINE TESTING W REFLEX): HIV Screen 4th Generation wRfx: NONREACTIVE

## 2022-06-28 LAB — BASIC METABOLIC PANEL
Anion gap: 11 (ref 5–15)
BUN: 14 mg/dL (ref 6–20)
CO2: 18 mmol/L — ABNORMAL LOW (ref 22–32)
Calcium: 8.7 mg/dL — ABNORMAL LOW (ref 8.9–10.3)
Chloride: 107 mmol/L (ref 98–111)
Creatinine, Ser: 0.65 mg/dL (ref 0.44–1.00)
GFR, Estimated: >60 mL/min (ref >60)
Glucose, Bld: 140 mg/dL — ABNORMAL HIGH (ref 70–99)
Potassium: 3.9 mmol/L (ref 3.5–5.1)
Sodium: 136 mmol/L (ref 135–145)

## 2022-06-28 LAB — SEDIMENTATION RATE: Sed Rate: 16 mm/hr (ref 0–20)

## 2022-06-28 LAB — C-REACTIVE PROTEIN: CRP: <0.5 mg/dL (ref <1.0)

## 2022-06-28 MED ORDER — TRAZODONE HCL 50 MG PO TABS
25.0000 mg | ORAL_TABLET | Freq: Every evening | ORAL | Status: DC | PRN
  Administered 2022-07-01 – 2022-07-03 (×2): 25 mg via ORAL
  Filled 2022-06-28 (×2): qty 1

## 2022-06-28 MED ORDER — HYDROMORPHONE HCL 1 MG/ML IJ SOLN
1.0000 mg | INTRAMUSCULAR | Status: AC | PRN
  Administered 2022-06-28 – 2022-06-30 (×5): 1 mg via INTRAVENOUS
  Filled 2022-06-28 (×6): qty 1

## 2022-06-28 MED ORDER — ONDANSETRON HCL 4 MG/2ML IJ SOLN
4.0000 mg | Freq: Four times a day (QID) | INTRAMUSCULAR | Status: DC | PRN
  Administered 2022-06-28 – 2022-07-03 (×5): 4 mg via INTRAVENOUS
  Filled 2022-06-28 (×5): qty 2

## 2022-06-28 MED ORDER — SODIUM CHLORIDE 0.9 % IV SOLN: INTRAVENOUS | Status: DC

## 2022-06-28 MED ORDER — MORPHINE SULFATE (PF) 2 MG/ML IV SOLN
2.0000 mg | INTRAVENOUS | Status: DC | PRN
  Administered 2022-06-28: 2 mg via INTRAVENOUS
  Filled 2022-06-28: qty 1

## 2022-06-28 MED ORDER — ACETAMINOPHEN 325 MG PO TABS
650.0000 mg | ORAL_TABLET | Freq: Four times a day (QID) | ORAL | Status: DC | PRN
  Administered 2022-06-28 – 2022-06-29 (×5): 650 mg via ORAL
  Filled 2022-06-28 (×5): qty 2

## 2022-06-28 MED ORDER — MAGNESIUM HYDROXIDE 400 MG/5ML PO SUSP: 30.0000 mL | Freq: Every day | ORAL | Status: DC | PRN

## 2022-06-28 MED ORDER — DEXAMETHASONE SODIUM PHOSPHATE 4 MG/ML IJ SOLN
4.0000 mg | Freq: Four times a day (QID) | INTRAMUSCULAR | Status: DC
  Administered 2022-06-28 – 2022-06-29 (×5): 4 mg via INTRAVENOUS
  Filled 2022-06-28 (×6): qty 1

## 2022-06-28 MED ORDER — ENOXAPARIN SODIUM 80 MG/0.8ML IJ SOSY
0.5000 mg/kg | PREFILLED_SYRINGE | INTRAMUSCULAR | Status: DC
  Administered 2022-06-28 – 2022-07-07 (×9): 62.5 mg via SUBCUTANEOUS
  Filled 2022-06-28 (×11): qty 0.63

## 2022-06-28 MED ORDER — SODIUM CHLORIDE 0.9 % IV BOLUS
1000.0000 mL | Freq: Once | INTRAVENOUS | Status: AC
  Administered 2022-06-28: 1000 mL via INTRAVENOUS

## 2022-06-28 MED ORDER — ONDANSETRON HCL 4 MG PO TABS
4.0000 mg | ORAL_TABLET | Freq: Four times a day (QID) | ORAL | Status: DC | PRN
  Administered 2022-06-29 – 2022-07-02 (×2): 4 mg via ORAL
  Filled 2022-06-28 (×2): qty 1

## 2022-06-28 MED ORDER — ACETAMINOPHEN 650 MG RE SUPP: 650.0000 mg | Freq: Four times a day (QID) | RECTAL | Status: DC | PRN

## 2022-06-28 NOTE — Consult Note (Addendum)
NAME: Marie Mills  DOB: 31-Oct-1970  MRN: [id number]  Date/Time: 06/28/2022 12:53 PM  REQUESTING PROVIDER: Dr.Yarbrough Subjective:  REASON FOR CONSULT: post op microdiscectomy L3-L4 with fluid collection.   ? Marie Mills is a 52 y.o. female with a history of lumbar radiculopathy underwent hemilaminotomy and L3-L4 microdiscectomy on 06/15/2022.  Postoperative period she seemed to be doing okay.  She had intermittently increased drainage from the surgical site and on 06/23/2022 she sent a picture to the surgeon's office and was prescribed clindamycin by the PA. Yesterday patient had gone to orthopedic surgeon for the knee pain and got a steroid shot in the rt knee Last night she came to the ED complaining of acute onset of left lower back pain radiating down the left buttock with numbness over the buttock on the inner thigh and labia.  She did not have any fever In the ED vitals  06/27/22  BP 154/76 !  Temp 98.4 F (36.9 C)  Pulse Rate 93  Resp 17  SpO2 95 %    Latest Reference Range & Units 06/27/22  WBC 4.0 - 10.5 K/uL 8.8  Hemoglobin 12.0 - 15.0 g/dL 11.8 (L)  HCT 36.0 - 46.0 % 36.8  Platelets 150 - 400 K/uL 288  Creatinine 0.44 - 1.00 mg/dL 0.77   MRI reported as fluid collection extending from the skin surface to the posterior and anterior aspect of the thecal sac at L3-L4 causing moderate thecal sac narrowing.  This collection was associated with increased T2 signal and contrast-enhancement and may represent a postoperative seroma.  Increased T2 signal and contrast-enhancement in the interspinous ligaments at L2-L3 L3-L4 and L4-L5 as well as bilateral L4-L5 facets differential is infection.  Contrast enhancement at the posterior aspect of the L4-L5 disc.  And there was question of a possible epidural collection from T12-L1 through L3-L4 Dr. Cari Caraway, neurosurgeon saw the patient and reviewed the films and thought it was all related to postoperative changes especially with patient  having no fever and also having normal inflammatory markers including ESR and CRP.  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   a.) properative PCR (+) prior to LEFT L3-4 MICRODISCECTOMY   Obesity    Pneumonia    Seizure [facility])    age 81-3; unknown cause nor if on medication    Past Surgical History:  Procedure Laterality Date   CHOLECYSTECTOMY  1987   COLONOSCOPY WITH PROPOFOL N/A 07/30/2021   Procedure: COLONOSCOPY WITH PROPOFOL;  Surgeon: Eloise Harman, DO;  [facility]: [facility];  Service: Endoscopy;  Laterality: N/A;  2:45pm   LUMBAR LAMINECTOMY/DECOMPRESSION MICRODISCECTOMY Left 06/15/2022   Procedure: LEFT L3-4 MICRODISCECTOMY;  Surgeon: Meade Maw, MD;  Location: [facility] ORS;  Service: Neurosurgery;  Laterality: Left;   POLYPECTOMY  07/30/2021   Procedure: POLYPECTOMY;  Surgeon: Eloise Harman, DO;  Location: [facility];  Service: Endoscopy;;    Social History   Socioeconomic History   Marital status: Divorced    Spouse name: Not on file   Number of children: 3   Years of education: Not on file   Highest education level: Not on file  Occupational History   Not on file  Tobacco Use   Smoking status: Never   Smokeless tobacco:  Never  Vaping Use   Vaping Use: Never used  Substance and Sexual Activity   Alcohol use: No   Drug use: No   Sexual activity: Not Currently    Birth control/protection: Pill  Other Topics Concern   Not on file  Social History Narrative   Lives with daughter   Social Determinants of Health   Financial Resource Strain: High Risk (03/23/2022)   Overall Financial Resource Strain (CARDIA)    Difficulty of Paying Living Expenses: Very hard  Food Insecurity: Food Insecurity Present  (03/23/2022)   Hunger Vital Sign    Worried About Running Out of Food in the Last Year: Often true    Ran Out of Food in the Last Year: Often true  Transportation Needs: No Transportation Needs (03/23/2022)   PRAPARE - Hydrologist (Medical): No    Lack of Transportation (Non-Medical): No  Physical Activity: Inactive (03/23/2022)   Exercise Vital Sign    Days of Exercise per Week: 0 days    Minutes of Exercise per Session: 0 min  Stress: Stress Concern Present (03/23/2022)   [place]    Feeling of Stress : Very much  Social Connections: Moderately Isolated (03/23/2022)   Social Connection and Isolation Panel [NHANES]    Frequency of Communication with Friends and Family: Once a week    Frequency of Social Gatherings with Friends and Family: Never    Attends Religious Services: More than 4 times per year    Active Member of [employer] or Organizations: Yes    Attends Archivist Meetings: 1 to 4 times per year    Marital Status: Divorced  Human resources officer Violence: Not At Risk (03/23/2022)   Humiliation, Afraid, Rape, and Kick questionnaire    Fear of Current or Ex-Partner: No    Emotionally Abused: No    Physically Abused: No    Sexually Abused: No    Family History  Problem Relation Age of Onset   Hypertension Mother    Hyperlipidemia Mother    Diabetes Mother    Hyperlipidemia Father    Hypertension Father    Diabetes Father    Seizures Father    Diabetes Sister    Hypertension Sister    Diabetes Sister    Hypertension Sister    Hypertension Sister    Diabetes Brother    Hypertension Brother    Asthma Daughter    Asthma Son    Cancer Maternal Aunt        breast   Colon cancer Neg Hx    Colon polyps Neg Hx    Allergies  Allergen Reactions   Penicillins Anaphylaxis    Has patient had a PCN reaction causing immediate rash, facial/tongue/throat swelling, SOB or  lightheadedness with hypotension: Yes Has patient had a PCN reaction causing severe rash involving mucus membranes or skin necrosis: No Has patient had a PCN reaction that required hospitalization Yes Has patient had a PCN reaction occurring within the last 10 years: No If all of the above answers are "NO", then may proceed with Cephalosporin use.    Codeine Other (See Comments)    Chest Pain   Sulfa Antibiotics Hives   I? Current Facility-Administered Medications  Medication Dose Route Frequency Provider Last Rate Last Admin   0.9 %  sodium chloride infusion   Intravenous Continuous Mansy, Jan A, MD 100 mL/hr at 06/28/22 1235 New Bag at 06/28/22 1235   acetaminophen (TYLENOL)  tablet 650 mg  650 mg Oral Q6H PRN Mansy, Jan A, MD       Or   acetaminophen (TYLENOL) suppository 650 mg  650 mg Rectal Q6H PRN Mansy, Jan A, MD       enoxaparin (LOVENOX) injection 62.5 mg  0.5 mg/kg Subcutaneous Q24H Narda Rutherford T, NP   62.5 mg at 06/28/22 0800   HYDROmorphone (DILAUDID) injection 1 mg  1 mg Intravenous Q3H PRN Barb Merino, MD   1 mg at 06/28/22 1112   magnesium hydroxide (MILK OF MAGNESIA) suspension 30 mL  30 mL Oral Daily PRN Mansy, Jan A, MD       ondansetron [facility]) tablet 4 mg  4 mg Oral Q6H PRN Mansy, Jan A, MD       Or   ondansetron [facility]) injection 4 mg  4 mg Intravenous Q6H PRN Mansy, Jan A, MD       traZODone (DESYREL) tablet 25 mg  25 mg Oral QHS PRN Mansy, Arvella Merles, MD         Abtx:  Anti-infectives (From admission, onward)    None       REVIEW OF SYSTEMS:  Const: negative fever, negative chills, negative weight loss Eyes: negative diplopia or visual changes, negative eye pain ENT: negative coryza, negative sore throat Resp: negative cough, hemoptysis, dyspnea Cards: negative for chest pain, palpitations, lower extremity edema GU: negative for frequency, dysuria and hematuria GI: Negative for abdominal pain, diarrhea, bleeding, constipation Skin: negative for rash  and pruritus Heme: negative for easy bruising and gum/nose bleeding MS:as above Neurolo:left buttock pain and numbness and also over the left labia and inner thigh Psych: anxiety,  Endocrine: negative for thyroid, diabetes Allergy/Immunology- PCN- was told by her mom that she had a reaction as a child  ? Objective:  VITALS:  BP (!) 152/88 (BP Location: Right Arm)   Pulse (!) 109   Temp 97.9 F (36.6 C)   Resp 16   Ht '5\' 6"'$  (1.676 m)   Wt 124 kg   LMP 06/20/2022 (Exact Date)   SpO2 97%   BMI 44.12 kg/m   PHYSICAL EXAM:  General: severe headache , some distress, oriented X5 Head: Normocephalic, without obvious abnormality, atraumatic. Eyes: Conjunctivae clear, anicteric sclerae. Pupils are equal ENT Nares normal. No drainage or sinus tenderness. Lips, mucosa, and tongue normal. No Thrush Neck: symmetrical, no adenopathy, thyroid: non tender no carotid bruit and no JVD. Back: surgical site- scar healed well, no discharge currently, no erythema  Lungs: Clear to auscultation bilaterally. No Wheezing or Rhonchi. No rales. Heart: Regular rate and rhythm, no murmur, rub or gallop. Abdomen: Soft, non-tender,not distended. Bowel sounds normal. No masses Extremities: atraumatic, no cyanosis. No edema. No clubbing Skin: No rashes or lesions. Or bruising Lymph: Cervical, supraclavicular normal. Neurologic: did not examine in detail due to pain Pertinent Labs Lab Results CBC    Component Value Date/Time   WBC 10.7 (H) 06/28/2022 0904   RBC 4.29 06/28/2022 0904   HGB 12.1 06/28/2022 0904   HGB 13.0 03/25/2022 1017   HCT 38.3 06/28/2022 0904   HCT 39.3 03/25/2022 1017   PLT 248 06/28/2022 0904   PLT 294 03/25/2022 1017   MCV 89.3 06/28/2022 0904   MCV 87 03/25/2022 1017   MCH 28.2 06/28/2022 0904   MCHC 31.6 06/28/2022 0904   RDW 14.2 06/28/2022 0904   RDW 13.4 03/25/2022 1017   LYMPHSABS 2.5 06/27/2022 1845   MONOABS 0.6 06/27/2022 1845   EOSABS 0.2  06/27/2022 1845    BASOSABS 0.0 06/27/2022 1845       Latest Ref Rng & Units 06/28/2022    9:04 AM 06/27/2022    6:45 PM 06/15/2022    6:51 AM  CMP  Glucose 70 - 99 mg/dL 140  86  94   BUN 6 - 20 mg/dL '14  18  16   '$ Creatinine 0.44 - 1.00 mg/dL 0.65  0.77  0.50   Sodium 135 - 145 mmol/L 136  138  140   Potassium 3.5 - 5.1 mmol/L 3.9  3.6  3.8   Chloride 98 - 111 mmol/L 107  110  110   CO2 22 - 32 mmol/L 18  20    Calcium 8.9 - 10.3 mg/dL 8.7  8.6    Total Protein 6.5 - 8.1 g/dL  7.0    Total Bilirubin 0.3 - 1.2 mg/dL  0.6    Alkaline Phos 38 - 126 U/L  79    AST 15 - 41 U/L  23    ALT 0 - 44 U/L  19        Microbiology: Recent Results (from the past 240 hour(s))  Culture, blood (routine x 2)     Status: None (Preliminary result)   Collection Time: 06/28/22 12:56 AM   Specimen: BLOOD  Result Value Ref Range Status   Specimen Description BLOOD RIGHT HAND  Final   Special Requests   Final    BOTTLES DRAWN AEROBIC AND ANAEROBIC Blood Culture results may not be optimal due to an inadequate volume of blood received in culture bottles   Culture   Final    NO GROWTH < 12 HOURS Performed at [facility], [address], [place], [place] [address]    Report Status PENDING  Incomplete  Culture, blood (routine x 2)     Status: None (Preliminary result)   Collection Time: 06/28/22 12:56 AM   Specimen: BLOOD  Result Value Ref Range Status   Specimen Description BLOOD LEFT HAND  Final   Special Requests   Final    BOTTLES DRAWN AEROBIC AND ANAEROBIC Blood Culture adequate volume   Culture   Final    NO GROWTH < 12 HOURS Performed at [facility], [address] [place], [place] [address]    Report Status PENDING  Incomplete    IMAGING RESULTS:  I have personally reviewed the films ?fluid collection from the site of surgery to the skin at L3-L4- likely post op changes , though radiologist D.D was infection Also reported some T2 signal changes in the interspinous ligaments at  L2-L3, L3-L4 and L4-L5 - post op VS infection Possible epidural collection along the dorsal aspect of thecal sac T12-L1 thru L3-L4  Impression/Recommendation ?Pt is 2 weeks post L3-L4 hemilaminotomy and microdiscectomy for lumbar radiculopathy. Presenting with severe pain and numbness over left buttock, groin and labia. Has had clear intermittent drainage from the surgical site and was on clindamycin since 06/24/22 prescribed as OP Pt also received rt knee intraarticular steroids yesterday The MRI findings of fluid collection at L3-L4 extending to skin, and pssoible epidural collection T12-L1-L3-L4 and interspinous ligament change and facet changes at L3-L4 can all reflect post op changes and Post Op seroma collection as this MRI is done in the early PO period. She has no fever , normal ESR/CRP and WBC and the surgical site looks good with no signs of infection.  Though Being on clindamycin before presentation may mire the clinical picture, currently there  is no compelling evidence to suggest infection but we will keep a close eye . After discussion with neurosurgeon and Intervention radiologist it has been decided not to aspirate any fluid now and also to hold off on antibiotics for now. Will observe her closely-   Neurosurgeon planning to give Decadron to ease the pain and neurological symptoms. Will observe her closely- ? Cannot rely on WBC count as it is bound to increase  with steroids-  will check procal and other markers of inflammation If there is discharge from the surgical site will do aerobic culture. Blood culture if temp > 100.6   ___________________________________________________ Discussed with patient and care team in detail Note:  This document was prepared using Dragon voice recognition software and may include unintentional dictation errors.

## 2022-06-28 NOTE — Progress Notes (Signed)
Patient seen and examined.  See details on H&P assessment plan from Dr. Sidney Ace.  she had no complaints.  Admitted early morning hours by nighttime hospitalist.  Recent L3/4 discectomy presents back with intermittent clear drainage from her back, pain in the back and abnormal MRI.  Followed by neurosurgery and interventional radiology.  IR was requested to do CT-guided drainage of epidural abscess/seroma.  Holding antibiotics.  Neurosurgery asked for ID consultation.   Same-day admit.  No charge visit.

## 2022-06-28 NOTE — Plan of Care (Signed)
  Problem: Education: Goal: Knowledge of General Education information will improve Description: Including pain rating scale, medication(s)/side effects and non-pharmacologic comfort measures 06/28/2022 1312 by Derrek Monaco, RN Outcome: Progressing 06/28/2022 1116 by Derrek Monaco, RN Outcome: Progressing   Problem: Health Behavior/Discharge Planning: Goal: Ability to manage health-related needs will improve 06/28/2022 1312 by Derrek Monaco, RN Outcome: Progressing 06/28/2022 1116 by Derrek Monaco, RN Outcome: Progressing   Problem: Clinical Measurements: Goal: Ability to maintain clinical measurements within normal limits will improve 06/28/2022 1312 by Derrek Monaco, RN Outcome: Progressing 06/28/2022 1116 by Derrek Monaco, RN Outcome: Progressing Goal: Will remain free from infection 06/28/2022 1312 by Derrek Monaco, RN Outcome: Progressing 06/28/2022 1116 by Derrek Monaco, RN Outcome: Progressing Goal: Diagnostic test results will improve 06/28/2022 1312 by Derrek Monaco, RN Outcome: Progressing 06/28/2022 1116 by Derrek Monaco, RN Outcome: Progressing Goal: Respiratory complications will improve 06/28/2022 1312 by Derrek Monaco, RN Outcome: Progressing 06/28/2022 1116 by Derrek Monaco, RN Outcome: Progressing Goal: Cardiovascular complication will be avoided 06/28/2022 1312 by Derrek Monaco, RN Outcome: Progressing 06/28/2022 1116 by Derrek Monaco, RN Outcome: Progressing   Problem: Activity: Goal: Risk for activity intolerance will decrease 06/28/2022 1312 by Derrek Monaco, RN Outcome: Progressing 06/28/2022 1116 by Derrek Monaco, RN Outcome: Progressing   Problem: Nutrition: Goal: Adequate nutrition will be maintained 06/28/2022 1312 by Derrek Monaco, RN Outcome: Progressing 06/28/2022 1116 by Derrek Monaco, RN Outcome: Progressing   Problem: Coping: Goal: Level of anxiety will  decrease 06/28/2022 1312 by Derrek Monaco, RN Outcome: Progressing 06/28/2022 1116 by Derrek Monaco, RN Outcome: Progressing   Problem: Elimination: Goal: Will not experience complications related to bowel motility 06/28/2022 1312 by Derrek Monaco, RN Outcome: Progressing 06/28/2022 1116 by Derrek Monaco, RN Outcome: Progressing Goal: Will not experience complications related to urinary retention 06/28/2022 1312 by Derrek Monaco, RN Outcome: Progressing 06/28/2022 1116 by Derrek Monaco, RN Outcome: Progressing   Problem: Pain Managment: Goal: General experience of comfort will improve 06/28/2022 1312 by Derrek Monaco, RN Outcome: Progressing 06/28/2022 1116 by Derrek Monaco, RN Outcome: Progressing   Problem: Safety: Goal: Ability to remain free from injury will improve 06/28/2022 1312 by Derrek Monaco, RN Outcome: Progressing 06/28/2022 1116 by Derrek Monaco, RN Outcome: Progressing   Problem: Skin Integrity: Goal: Risk for impaired skin integrity will decrease 06/28/2022 1312 by Derrek Monaco, RN Outcome: Progressing 06/28/2022 1116 by Derrek Monaco, RN Outcome: Progressing

## 2022-06-28 NOTE — Assessment & Plan Note (Signed)
-   We will continue her inhalers. - We will continue Singulair.

## 2022-06-28 NOTE — Progress Notes (Signed)
   06/28/22 1547  Assess: MEWS Score  Temp 98.8 F (37.1 C)  BP 137/68  MAP (mmHg) 86  Pulse Rate (!) 128  Resp (!) 22  SpO2 97 %  O2 Device Room Air  Assess: MEWS Score  MEWS Temp 0  MEWS Systolic 0  MEWS Pulse 2  MEWS RR 1  MEWS LOC 0  MEWS Score 3  MEWS Score Color Yellow  Assess: if the MEWS score is Yellow or Red  Were vital signs taken at a resting state? Yes  Focused Assessment Change from prior assessment (see assessment flowsheet)  Does the patient meet 2 or more of the SIRS criteria? No  Does the patient have a confirmed or suspected source of infection? No  Provider and Rapid Response Notified? No  MEWS guidelines implemented  Yes, yellow  Treat  MEWS Interventions Considered administering scheduled or prn medications/treatments as ordered  Take Vital Signs  Increase Vital Sign Frequency  Yellow: Q2hr x1, continue Q4hrs until patient remains green for 12hrs  Escalate  MEWS: Escalate Yellow: Discuss with charge nurse and consider notifying provider and/or RRT  Notify: Charge Nurse/RN  Name of Charge Nurse/RN Notified Wilder Glade RN  Assess: SIRS CRITERIA  SIRS Temperature  0  SIRS Pulse 1  SIRS Respirations  1  SIRS WBC 0  SIRS Score Sum  2

## 2022-06-28 NOTE — Progress Notes (Signed)
MEWS Progress Note  Patient Details Name: Marie Mills MRN: [id number] DOB: 1970/07/13 Today's Date: 06/28/2022   MEWS Flowsheet Documentation:  Assess: MEWS Score Temp: 98.3 F (36.8 C) BP: (!) 141/71 MAP (mmHg): 90 Pulse Rate: (!) 114 Resp: 18 Level of Consciousness: Alert SpO2: 96 % O2 Device: Room Air Assess: MEWS Score MEWS Temp: 0 MEWS Systolic: 0 MEWS Pulse: 2 MEWS RR: 0 MEWS LOC: 0 MEWS Score: 2 MEWS Score Color: Yellow Assess: SIRS CRITERIA SIRS Temperature : 0 SIRS Respirations : 0 SIRS Pulse: 1 SIRS WBC: 0 SIRS Score Sum : 1   Notify: Charge Nurse/RN Name of Charge Nurse/RN Notified: Wilder Glade RN      Derrek Monaco 06/28/2022, 6:53 PM

## 2022-06-28 NOTE — Assessment & Plan Note (Signed)
-   We will continue Neurontin. ?

## 2022-06-28 NOTE — Plan of Care (Signed)

## 2022-06-28 NOTE — Assessment & Plan Note (Addendum)
-   This is mainly at L4-L5 level.  She is status post L3-L4 left hemilaminotomy and microdiscectomy,  - We will admit her to medical bed. - We will hold off IV antibiotics pending biopsy by IR.  Neurosurgery recommendation. - Neurosurgery consult will be obtained as well. - Dr. Izora Ribas was notified about the patient. - Pain management will be provided.

## 2022-06-28 NOTE — H&P (Signed)
[place]   PATIENT NAME: Marie Mills    MR#:  [id number]  DATE OF BIRTH:  1971-02-25  DATE OF ADMISSION:  06/27/2022  PRIMARY CARE PHYSICIAN: Neale Burly, MD   Patient is coming from: Home  REQUESTING/REFERRING PHYSICIAN: Cuthriell, Leola Brazil  CHIEF COMPLAINT:  No chief complaint on file.   HISTORY OF PRESENT ILLNESS:  Marie Mills is a 52 y.o. female with medical history significant for GERD, hypertension, asthma, lumbar radiculopathy and remote seizure disorder, who underwent L3-L4 left hemilaminotomy and microdiscectomy on 2/28 by Dr. Izora Ribas, who presented to the emergency room with acute onset of left groin and thigh pain and numbness.  She has been having mild sinus drainage from her incision.  No fever or chills.  No nausea or vomiting or abdominal pain.  She denies any significant back pain.  No dysuria, oliguria or hematuria or flank pain.  She denies any urinary or stool incontinence.  No chest pain no dyspnea or palpitations or cough or wheezing.  She admits to left lower extremity weakness.  ED Course: Upon presentation to the emergency room, BP 154/76 with otherwise normal vital signs.  Labs revealed calcium of 8.6 with unremarkable CMP.  CBC showed hemoglobin 11.8 and hematocrit 36.8.  Sed rate was 16.  Blood cultures were drawn.  Imaging: Lumbar spine MR with and I without contrast revealed the following: 1. Status post L3-L4 left hemilaminotomy and microdiscectomy, with a fluid collection extending from the skin surface to the posterior and anterior aspect of the thecal sac at L3-L4, causing moderate thecal sac narrowing. The collection is associated with increased T2 signal and contrast enhancement and may represent a postoperative seroma, but an infected collection cannot be excluded. 2. Increased T2 signal and contrast enhancement in the interspinous ligaments at L2-L3, L3-L4, and L4-L5, as well as in the bilateral L3-L4 facets, concerning  for infection, although this could also be post-surgical. 3. Contrast enhancement at the posterior aspect of the L4-L5 disc, concerning for discitis. 4. Possible epidural fluid collection along the dorsal aspect of the thecal sac from T12-L1 through L3-L4, possibly a CSF leak, which causes additional moderate thecal sac narrowing at L1-L2 and L2-L3.  The patient was given 10 mg of IV Decadron, 1 mg of IV Dilaudid, 4 mg of IV morphine sulfate and 4 mg of IV Zofran.  Contact was made with Dr. Cari Caraway who did not believe there was CSF leak but recommended admission for evaluation and management of her lumbar discitis and holding IV antibiotic therapy pending IR guided biopsy.  She will be admitted to a medical bed for further evaluation and management.  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   a.) properative PCR (+) prior to LEFT L3-4 MICRODISCECTOMY   Obesity    Pneumonia    Seizure [facility])    age 20-3; unknown cause nor if on medication    PAST SURGICAL HISTORY:   Past Surgical History:  Procedure Laterality Date   CHOLECYSTECTOMY  1987   COLONOSCOPY WITH PROPOFOL N/A 07/30/2021   Procedure: COLONOSCOPY WITH PROPOFOL;  Surgeon: Eloise Harman, DO;  Location: [facility];  Service: Endoscopy;  Laterality: N/A;  2:45pm   LUMBAR LAMINECTOMY/DECOMPRESSION MICRODISCECTOMY Left 06/15/2022  Procedure: LEFT L3-4 MICRODISCECTOMY;  Surgeon: Meade Maw, MD;  Location: [facility] ORS;  Service: Neurosurgery;  Laterality: Left;   POLYPECTOMY  07/30/2021   Procedure: POLYPECTOMY;  Surgeon: Eloise Harman, DO;  Location: [facility];  Service: Endoscopy;;    SOCIAL HISTORY:   Social History   Tobacco Use   Smoking status: Never   Smokeless tobacco:  Never  Substance Use Topics   Alcohol use: No    FAMILY HISTORY:   Family History  Problem Relation Age of Onset   Hypertension Mother    Hyperlipidemia Mother    Diabetes Mother    Hyperlipidemia Father    Hypertension Father    Diabetes Father    Seizures Father    Diabetes Sister    Hypertension Sister    Diabetes Sister    Hypertension Sister    Hypertension Sister    Diabetes Brother    Hypertension Brother    Asthma Daughter    Asthma Son    Cancer Maternal Aunt        breast   Colon cancer Neg Hx    Colon polyps Neg Hx     DRUG ALLERGIES:   Allergies  Allergen Reactions   Penicillins Anaphylaxis    Has patient had a PCN reaction causing immediate rash, facial/tongue/throat swelling, SOB or lightheadedness with hypotension: Yes Has patient had a PCN reaction causing severe rash involving mucus membranes or skin necrosis: No Has patient had a PCN reaction that required hospitalization Yes Has patient had a PCN reaction occurring within the last 10 years: No If all of the above answers are "NO", then may proceed with Cephalosporin use.    Codeine Other (See Comments)    Chest Pain   Sulfa Antibiotics Hives    REVIEW OF SYSTEMS:   ROS As per history of present illness. All pertinent systems were reviewed above. Constitutional, HEENT, cardiovascular, respiratory, GI, GU, musculoskeletal, neuro, psychiatric, endocrine, integumentary and hematologic systems were reviewed and are otherwise negative/unremarkable except for positive findings mentioned above in the HPI.   MEDICATIONS AT HOME:   Prior to Admission medications   Medication Sig Start Date End Date Taking? Authorizing Provider  amLODipine (NORVASC) 2.5 MG tablet TAKE 1 TABLET(2.5 MG) BY MOUTH DAILY Patient taking differently: Take 2.5 mg by mouth daily. 10/18/21  Yes Fay Records, MD  cetirizine (ZYRTEC) 10 MG tablet Take 1 tablet (10 mg total) by mouth daily. 08/09/21  Yes Leath-Warren, Alda Lea,  NP  clindamycin (CLEOCIN) 150 MG capsule Take 1 capsule (150 mg total) by mouth 3 (three) times daily for 7 days. 06/23/22 06/30/22 Yes Geronimo Boot, PA-C  gabapentin (NEURONTIN) 300 MG capsule Take 1 capsule (300 mg total) by mouth 3 (three) times daily. 01/06/22  Yes Carole Civil, MD  losartan (COZAAR) 50 MG tablet Take 50 mg by mouth daily. 09/08/21  Yes [provider]  magnesium gluconate (MAGONATE) 500 MG tablet Take 500 mg by mouth 2 (two) times daily.   Yes [provider]  methocarbamol (ROBAXIN-750) 750 MG tablet Take 1 tablet (750 mg total) by mouth 4 (four) times daily. 06/15/22  Yes Loleta Dicker, PA  methylPREDNISolone (MEDROL DOSEPAK) 4 MG TBPK tablet Take 6 on day 1, 5 on day 2, 4 on day 3, 3 on day 4, 2 on day 5, 1 on day 6 06/27/22  Yes Cuthriell, Charline Bills, PA-C  metoprolol succinate (TOPROL-XL) 25 MG 24 hr tablet Take 25 mg by mouth daily. 04/23/19  Yes [provider]  montelukast (SINGULAIR) 10 MG tablet Take 10 mg by mouth daily. 05/03/21  Yes [provider]  Norgestim-Eth Estrad Triphasic (TRI-SPRINTEC PO) Take 1 tablet by mouth daily.   Yes [provider]  omeprazole (PRILOSEC) 40 MG capsule TK 1 C PO QD Patient taking differently: Take 40 mg by mouth daily. 03/04/19  Yes Derrek Monaco A, NP  oxyCODONE (ROXICODONE) 5 MG immediate release tablet Take 1 tablet (5 mg total) by mouth every 4 (four) hours as needed for severe pain. 06/15/22  Yes Loleta Dicker, PA  Potassium 99 MG TABS Take 1 tablet by mouth daily.   Yes [provider]  pyridOXINE (VITAMIN B6) 100 MG tablet Take 100 mg by mouth daily.   Yes [provider]  sulindac (CLINORIL) 150 MG tablet TAKE 1 TABLET(150 MG) BY MOUTH TWICE DAILY 06/23/22  Yes Sanjuana Kava, MD  albuterol (VENTOLIN HFA) 108 (90 Base) MCG/ACT inhaler Inhale 2 puffs into the lungs every 4 (four) hours as needed for wheezing or shortness of breath. 05/13/22   Volney American, PA-C  azelastine (ASTELIN) 0.1 % nasal spray Place 2 sprays into both nostrils 2 (two) times daily. Use in each nostril as directed Patient taking differently: Place 2 sprays into both nostrils 2 (two) times daily as needed. Use in each nostril as directed 08/09/21   Leath-Warren, Alda Lea, NP  budesonide (PULMICORT) 1 MG/2ML nebulizer solution Take 1 mg by nebulization daily as needed. 09/08/21   [provider]  clotrimazole-betamethasone (LOTRISONE) cream Apply 1 Application topically 2 (two) times daily. Patient taking differently: Apply 1 Application topically 2 (two) times daily as needed. 05/30/22   Estill Dooms, NP  fluticasone (FLONASE) 50 MCG/ACT nasal spray Place 1 spray into both nostrils daily as needed.    [provider]  ipratropium (ATROVENT) 0.06 % nasal spray Place 2 sprays into both nostrils 4 (four) times daily as needed.    [provider]  nystatin cream (MYCOSTATIN) Apply 1 Application topically 2 (two) times daily. Patient taking differently: Apply 1 Application topically 2 (two) times daily as needed for dry skin. 12/06/21   Estill Dooms, NP  senna (SENOKOT) 8.6 MG TABS tablet Take 1 tablet (8.6 mg total) by mouth daily as needed for mild constipation. 06/15/22   Loleta Dicker, PA  tiZANidine (ZANAFLEX) 4 MG tablet Take 4 mg by mouth as needed for muscle spasms.    [provider]  valACYclovir (VALTREX) 1000 MG tablet Take 1 tablet (1,000 mg total) by mouth 2 (two) times daily. Patient taking differently: Take 1,000 mg by mouth 2 (two) times daily as needed. 12/14/21   Estill Dooms, NP      VITAL SIGNS:  Blood pressure (!) 154/81, pulse 92, temperature 97.9 F (36.6 C), temperature source Oral, resp. rate 16, height '5\' 6"'$  (1.676 m), weight 124 kg, last menstrual period 06/20/2022, SpO2 96 %.  PHYSICAL EXAMINATION:  Physical Exam  GENERAL:  52 y.o.-year-old female patient lying in the bed with no  acute distress.  EYES: Pupils equal, round, reactive to light and accommodation. No scleral icterus. Extraocular muscles intact.  HEENT: Head atraumatic, normocephalic. Oropharynx and nasopharynx clear.  NECK:  Supple, no jugular venous distention. No thyroid enlargement, no tenderness.  LUNGS: Normal breath sounds bilaterally, no wheezing, rales,rhonchi or crepitation. No use of accessory muscles of respiration.  CARDIOVASCULAR: Regular rate and rhythm, S1, S2 normal. No murmurs, rubs, or gallops.  ABDOMEN: Soft,  nondistended, nontender. Bowel sounds present. No organomegaly or mass.  EXTREMITIES: No pedal edema, cyanosis, or clubbing.  NEUROLOGIC: Cranial nerves II through XII are intact. Muscle strength 5/5 in all extremities. Sensation intact. Gait not checked. Musculoskeletal: Her discectomy wound was sutured in the ED.  She had mild tenderness in the perioperative area.  No significant erythema or swelling. PSYCHIATRIC: The patient is alert and oriented x 3.  Normal affect and good eye contact. SKIN: No obvious rash, lesion, or ulcer.   LABORATORY PANEL:   CBC Recent Labs  Lab 06/27/22 1845  WBC 8.8  HGB 11.8*  HCT 36.8  PLT 288   ------------------------------------------------------------------------------------------------------------------  Chemistries  Recent Labs  Lab 06/27/22 1845  NA 138  K 3.6  CL 110  CO2 20*  GLUCOSE 86  BUN 18  CREATININE 0.77  CALCIUM 8.6*  AST 23  ALT 19  ALKPHOS 79  BILITOT 0.6   ------------------------------------------------------------------------------------------------------------------  Cardiac Enzymes No results for input(s): "TROPONINI" in the last 168 hours. ------------------------------------------------------------------------------------------------------------------  RADIOLOGY:  MR Lumbar Spine W Wo Contrast  Result Date: 06/27/2022 CLINICAL DATA:  Postop, increasing pain, drainage from surgical site EXAM: MRI  LUMBAR SPINE WITHOUT AND WITH CONTRAST TECHNIQUE: Multiplanar and multiecho pulse sequences of the lumbar spine were obtained without and with intravenous contrast. CONTRAST:  62m GADAVIST GADOBUTROL 1 MMOL/ML IV SOLN COMPARISON:  02/03/2022 FINDINGS: Segmentation:  Standard. Alignment: No listhesis. Minimal dextrocurvature. Straightening of the normal lumbar lordosis. Vertebrae: No acute fracture or suspicious osseous lesion. Status post L3-L4 left hemilaminotomy and microdiscectomy. Contrast enhancement is noted at the posterior aspect of the L4-L5 disc (series 10, images 8-10). No definite osseous enhancement. Conus medullaris and cauda equina: Conus extends to the L1 level. Conus and cauda equina appear normal. No abnormal nerve root enhancement. Along the dorsal aspect of the thecal sac, there is possible fluid collection that causes mass effect on the thecal sac, which follow CSF on most sequences and may represent a CSF leak (series 8, image 8). This extends along the dorsal aspect of the thecal sac from approximately T12-L1 through L3-L4. Paraspinal and other soft tissues: Postsurgical changes posterior to L3 and L4, with a fluid collection extending from the skin surface to the posterior, left, and anterior aspect of the thecal sac at the L3-L4 disc level (series 8, image 26), measuring approximately 8.3 x 1.8 x 2.1 cm (AP x TR x CC) (series 8, image 24 and series 5, image 10). Contrast enhancement is noted along this tract (series 10, image 10, as well as along the left aspect of the L3 and L4 spinous process (series 11, images 26-27). Increased T2 signal and contrast enhancement is also noted in the interspinous ligaments at L2-L3, L3-L4, and L4-L5 (series 7, image 9 and series 10, image 9) and in the bilateral L3-L4 facets (series 10, image 6 and 12 and series 7, image 6 and 12). Disc levels: T12-L1: No significant disc bulge. No spinal canal stenosis or neural foraminal narrowing. L1-L2: No significant  disc bulge. There is moderate thecal sac narrowing, secondary to a possible dorsal fluid collection (series 8, image 8). No neural foraminal narrowing. L2-L3: Mild disc bulge. Moderate thecal sac narrowing secondary to a dorsal fluid collection. No neural foraminal narrowing. L3-L4: Status post interval micro discectomy, with no residual disc protrusion/extrusion seen. Bilateral facet effusions. Fluid is noted extending along of the posterior, left, and anterior aspect of the thecal sac, contributing to moderate compression (series 8, image 25). No neural foraminal narrowing.  L4-L5: Small central disc protrusion. Mild facet arthropathy. No spinal canal stenosis or neural foraminal narrowing. L5-S1: No significant disc bulge. No spinal canal stenosis or neural foraminal narrowing. IMPRESSION: 1. Status post L3-L4 left hemilaminotomy and microdiscectomy, with a fluid collection extending from the skin surface to the posterior and anterior aspect of the thecal sac at L3-L4, causing moderate thecal sac narrowing. The collection is associated with increased T2 signal and contrast enhancement and may represent a postoperative seroma, but an infected collection cannot be excluded. 2. Increased T2 signal and contrast enhancement in the interspinous ligaments at L2-L3, L3-L4, and L4-L5, as well as in the bilateral L3-L4 facets, concerning for infection, although this could also be post-surgical. 3. Contrast enhancement at the posterior aspect of the L4-L5 disc, concerning for discitis. 4. Possible epidural fluid collection along the dorsal aspect of the thecal sac from T12-L1 through L3-L4, possibly a CSF leak, which causes additional moderate thecal sac narrowing at L1-L2 and L2-L3. These results were called by telephone at the time of interpretation on 06/27/2022 at 11:53 pm to provider [facility] , who verbally acknowledged these results. Electronically Signed   By: Merilyn Baba M.D.   On: 06/27/2022 23:54       IMPRESSION AND PLAN:  Assessment and Plan: * Lumbar discitis - This is mainly at L4-L5 level.  She is status post L3-L4 left hemilaminotomy and microdiscectomy,  - We will admit her to medical bed. - We will hold off IV antibiotics pending biopsy by IR.  Neurosurgery recommendation. - Neurosurgery consult will be obtained as well. - Dr. Izora Ribas was notified about the patient. - Pain management will be provided.  Asthma, chronic - We will continue her inhalers. - We will continue Singulair.  Lumbar radiculopathy - We will continue Neurontin.  Essential hypertension - We will continue her antihypertensives.   DVT prophylaxis: Lovenox.  Advanced Care Planning:  Code Status: full code.  Family Communication:  The plan of care was discussed in details with the patient (and family). I answered all questions. The patient agreed to proceed with the above mentioned plan. Further management will depend upon hospital course. Disposition Plan: Back to previous home environment Consults called: Neurosurgery and IR. All the records are reviewed and case discussed with ED provider.  Status is: Inpatient   At the time of the admission, it appears that the appropriate admission status for this patient is inpatient.  This is judged to be reasonable and necessary in order to provide the required intensity of service to ensure the patient's safety given the presenting symptoms, physical exam findings and initial radiographic and laboratory data in the context of comorbid conditions.  The patient requires inpatient status due to high intensity of service, high risk of further deterioration and high frequency of surveillance required.  I certify that at the time of admission, it is my clinical judgment that the patient will require inpatient hospital care extending more than 2 midnights.                            Dispo: The patient is from: Home              Anticipated d/c is to: Home               Patient currently is not medically stable to d/c.              Difficult to place patient: No  Nettie Wyffels  Alfredo Martinez M.D on 06/28/2022 at 4:15 AM  [facility]   From 7 PM-7 AM, contact night-coverage [email or web address]  CC: Primary care physician; Neale Burly, MD

## 2022-06-28 NOTE — Assessment & Plan Note (Signed)
-   We will continue her antihypertensives. 

## 2022-06-28 NOTE — Consult Note (Signed)
Consult requested by:  Dr. Sidney Ace  Consult requested for:  Post-op fluid collection  Primary Physician:  Neale Burly, MD  History of Present Illness: 06/28/2022 [name] is here today with a chief complaint of left leg pain and numbness.  She has been in touch with my office after a L L3/4 discectomy on Jun 15, 2022. She was feeling well until a few days ago when this began.  She has had intermittent clear drainage from her back as well.  She denies fevers, chills.  She is able to control her urine and bowel movements, but has some numbness in her legs and around her private area.  She presents to the hospital at my direction to evaluate for recurrent disc herniation or other post-operative issue.  She is able to walk but feels her left leg is weak.  Review of Systems:  A 10 point review of systems is negative, except for the pertinent positives and negatives detailed in the HPI.  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)    age 89-3; unknown cause nor if on medication    Past Surgical History: Past Surgical History:  Procedure Laterality Date   CHOLECYSTECTOMY  1987   COLONOSCOPY WITH PROPOFOL N/A 07/30/2021   Procedure: COLONOSCOPY WITH PROPOFOL;  Surgeon: Eloise Harman, DO;  Location: [facility];  Service: Endoscopy;  Laterality: N/A;  2:45pm   LUMBAR LAMINECTOMY/DECOMPRESSION MICRODISCECTOMY Left 06/15/2022   Procedure: LEFT L3-4 MICRODISCECTOMY;  Surgeon: Meade Maw, MD;  Location: [facility] ORS;  Service: Neurosurgery;  Laterality: Left;   POLYPECTOMY  07/30/2021   Procedure: POLYPECTOMY;  Surgeon: Eloise Harman, DO;   Location: [facility];  Service: Endoscopy;;    Allergies: Allergies as of 06/27/2022 - Review Complete 06/27/2022  Allergen Reaction Noted   Penicillins Anaphylaxis 05/07/2011   Codeine Other (See Comments) 05/07/2011   Sulfa antibiotics Hives 08/29/2013    Medications: Current Meds  Medication Sig   amLODipine (NORVASC) 2.5 MG tablet TAKE 1 TABLET(2.5 MG) BY MOUTH DAILY (Patient taking differently: Take 2.5 mg by mouth daily.)   cetirizine (ZYRTEC) 10 MG tablet Take 1 tablet (10 mg total) by mouth daily.   clindamycin (CLEOCIN) 150 MG capsule Take 1 capsule (150 mg total) by mouth 3 (three) times daily for 7 days.   gabapentin (NEURONTIN) 300 MG capsule Take 1 capsule (300 mg total) by mouth 3 (three) times daily.   losartan (COZAAR) 50 MG tablet Take 50 mg by mouth daily.   magnesium gluconate (MAGONATE) 500 MG tablet Take 500 mg by mouth 2 (two) times daily.   methocarbamol (ROBAXIN-750) 750 MG tablet Take 1 tablet (750 mg total) by mouth 4 (four) times daily.   methylPREDNISolone (MEDROL DOSEPAK) 4 MG TBPK tablet Take 6 on day 1, 5 on day 2, 4 on day 3, 3 on day 4, 2 on day 5, 1 on day 6   metoprolol succinate (TOPROL-XL) 25 MG 24 hr tablet Take 25 mg by mouth daily.   montelukast (SINGULAIR) 10 MG tablet Take 10 mg by mouth daily.   Norgestim-Eth Estrad Triphasic (TRI-SPRINTEC PO) Take 1  tablet by mouth daily.   omeprazole (PRILOSEC) 40 MG capsule TK 1 C PO QD (Patient taking differently: Take 40 mg by mouth daily.)   oxyCODONE (ROXICODONE) 5 MG immediate release tablet Take 1 tablet (5 mg total) by mouth every 4 (four) hours as needed for severe pain.   Potassium 99 MG TABS Take 1 tablet by mouth daily.   pyridOXINE (VITAMIN B6) 100 MG tablet Take 100 mg by mouth daily.   sulindac (CLINORIL) 150 MG tablet TAKE 1 TABLET(150 MG) BY MOUTH TWICE DAILY    Social History: Social History   Tobacco Use   Smoking status: Never   Smokeless tobacco: Never  Vaping Use   Vaping  Use: Never used  Substance Use Topics   Alcohol use: No   Drug use: No    Family Medical History: Family History  Problem Relation Age of Onset   Hypertension Mother    Hyperlipidemia Mother    Diabetes Mother    Hyperlipidemia Father    Hypertension Father    Diabetes Father    Seizures Father    Diabetes Sister    Hypertension Sister    Diabetes Sister    Hypertension Sister    Hypertension Sister    Diabetes Brother    Hypertension Brother    Asthma Daughter    Asthma Son    Cancer Maternal Aunt        breast   Colon cancer Neg Hx    Colon polyps Neg Hx     Physical Examination: Vitals:   06/28/22 0500 06/28/22 0530  BP: (!) 158/83 (!) 162/71  Pulse: 95 97  Resp:  16  Temp:    SpO2: 94% 92%    General: Patient is well developed, well nourished, calm, collected, and in no apparent distress. Attention to examination is appropriate.  Neck:   Supple.  Full range of motion.  Respiratory: Patient is breathing without any difficulty.   NEUROLOGICAL:     Awake, alert, oriented to person, place, and time.  Speech is clear and fluent.  Cranial Nerves: Pupils equal round and reactive to light.  Facial tone is symmetric.  Facial sensation is symmetric. Shoulder shrug is symmetric. Tongue protrusion is midline.  There is no pronator drift.  ROM of spine: full.    Strength: Side Biceps Triceps Deltoid Interossei Grip Wrist Ext. Wrist Flex.  R '5 5 5 5 5 5 5  '$ L '5 5 5 5 5 5 5   '$ Side Iliopsoas Quads Hamstring PF DF EHL  R '5 5 5 5 5 5  '$ L 4+ 4+ 4+ 4+ 4+ 4+   Hoffman's is absent.   Bilateral upper and lower extremity sensation is intact to light touch.    No evidence of dysmetria noted.  Gait is untested.    Incision is clean and without drainage. No erythema or induration.   Medical Decision Making  Imaging: MRI L spine 06/27/2022 IMPRESSION: 1. Status post L3-L4 left hemilaminotomy and microdiscectomy, with a fluid collection extending from the skin  surface to the posterior and anterior aspect of the thecal sac at L3-L4, causing moderate thecal sac narrowing. The collection is associated with increased T2 signal and contrast enhancement and may represent a postoperative seroma, but an infected collection cannot be excluded. 2. Increased T2 signal and contrast enhancement in the interspinous ligaments at L2-L3, L3-L4, and L4-L5, as well as in the bilateral L3-L4 facets, concerning for infection, although this could also be post-surgical. 3. Contrast enhancement at  the posterior aspect of the L4-L5 disc, concerning for discitis. 4. Possible epidural fluid collection along the dorsal aspect of the thecal sac from T12-L1 through L3-L4, possibly a CSF leak, which causes additional moderate thecal sac narrowing at L1-L2 and L2-L3.   These results were called by telephone at the time of interpretation on 06/27/2022 at 11:53 pm to provider [facility] , who verbally acknowledged these results.     Electronically Signed   By: Merilyn Baba M.D.   On: 06/27/2022 23:54  I have personally reviewed the images. I have reviewed her prior MRI, and the appearance of her L4/5 disc is similar.  Thus, I feel this more likely represents a chronic finding.  Labs  ESR 16 (normal) CRP <0.5 WBC 8.8  Assessment and Plan: Ms. Barbaro is a pleasant 52 y.o. female who is s/p L L3/4 microdiscectomy (uncomplicated) who presents with numbness and clear wound drainage. She shows no signs of systemic infection.  Her MRI appearance at L4/5 is similar to her prior MRI from October 2023 in my opinion.   There was STIR signal in her prior MRI in the L4/5 disc. There is some enhancement throughout, but that may be secondary to normal post-operative change.  Given the low inflammatory numbers, I think it is very unlikely that this represents a true infection.    There is some post-operative fluid in the operative bed and extending in the intracanalicular  space. There was no identified spinal fluid leak during her surgery, so this probably represents a sterile seroma that has dissected into the spaces with which it is contiguous.  Ms. Tande is suffering from some numbness and a challenging post-operative recovery, but I expect she will improve with time.   - Recommend ID consult to comment on possibility of discitis given reassuring lab data - Consider IR disc aspiration pending ID recs - OK to mobilize with PTOT - Would consider a pulse of steroids given reassuring numbers. Will wait on formal ID opinion.  I have communicated my recommendations to the requesting physician and coordinated care to facilitate these recommendations.     Yvette Loveless K. Izora Ribas MD, [facility] Neurosurgery

## 2022-06-29 DIAGNOSIS — M4646 Discitis, unspecified, lumbar region: Secondary | ICD-10-CM | POA: Diagnosis not present

## 2022-06-29 DIAGNOSIS — G9763 Postprocedural seroma of a nervous system organ or structure following a nervous system procedure: Secondary | ICD-10-CM | POA: Diagnosis not present

## 2022-06-29 DIAGNOSIS — M5416 Radiculopathy, lumbar region: Secondary | ICD-10-CM | POA: Diagnosis not present

## 2022-06-29 LAB — C-REACTIVE PROTEIN: CRP: 13.9 mg/dL — ABNORMAL HIGH (ref <1.0)

## 2022-06-29 MED ORDER — SODIUM BICARBONATE 650 MG PO TABS
650.0000 mg | ORAL_TABLET | Freq: Three times a day (TID) | ORAL | Status: AC
  Administered 2022-06-29 (×3): 650 mg via ORAL
  Filled 2022-06-29 (×4): qty 1

## 2022-06-29 MED ORDER — METHOCARBAMOL 500 MG PO TABS
750.0000 mg | ORAL_TABLET | Freq: Three times a day (TID) | ORAL | Status: DC | PRN
  Administered 2022-06-29 – 2022-07-04 (×9): 750 mg via ORAL
  Filled 2022-06-29 (×9): qty 2

## 2022-06-29 MED ORDER — SODIUM CHLORIDE 0.9 % IV SOLN
2.0000 g | Freq: Three times a day (TID) | INTRAVENOUS | Status: DC
  Administered 2022-06-29 – 2022-07-07 (×23): 2 g via INTRAVENOUS
  Filled 2022-06-29 (×9): qty 40
  Filled 2022-06-29: qty 20
  Filled 2022-06-29 (×3): qty 40
  Filled 2022-06-29: qty 20
  Filled 2022-06-29 (×3): qty 40
  Filled 2022-06-29: qty 20
  Filled 2022-06-29 (×10): qty 40

## 2022-06-29 MED ORDER — VANCOMYCIN HCL IN DEXTROSE 1-5 GM/200ML-% IV SOLN
1000.0000 mg | Freq: Once | INTRAVENOUS | Status: AC
  Administered 2022-06-29: 1000 mg via INTRAVENOUS
  Filled 2022-06-29: qty 200

## 2022-06-29 MED ORDER — LOSARTAN POTASSIUM 50 MG PO TABS
50.0000 mg | ORAL_TABLET | Freq: Every day | ORAL | Status: DC
  Administered 2022-06-29 – 2022-07-04 (×6): 50 mg via ORAL
  Filled 2022-06-29 (×9): qty 1

## 2022-06-29 MED ORDER — GABAPENTIN 300 MG PO CAPS
300.0000 mg | ORAL_CAPSULE | Freq: Three times a day (TID) | ORAL | Status: DC
  Administered 2022-06-29 – 2022-07-04 (×15): 300 mg via ORAL
  Filled 2022-06-29 (×15): qty 1

## 2022-06-29 MED ORDER — ALBUTEROL SULFATE (2.5 MG/3ML) 0.083% IN NEBU: 3.0000 mL | INHALATION_SOLUTION | RESPIRATORY_TRACT | Status: DC | PRN

## 2022-06-29 MED ORDER — SUMATRIPTAN SUCCINATE 50 MG PO TABS
25.0000 mg | ORAL_TABLET | Freq: Once | ORAL | Status: AC
  Administered 2022-06-29: 25 mg via ORAL
  Filled 2022-06-29: qty 1

## 2022-06-29 MED ORDER — METOPROLOL SUCCINATE ER 25 MG PO TB24
25.0000 mg | ORAL_TABLET | Freq: Every day | ORAL | Status: DC
  Administered 2022-06-29 – 2022-07-07 (×9): 25 mg via ORAL
  Filled 2022-06-29 (×9): qty 1

## 2022-06-29 MED ORDER — OXYCODONE HCL 5 MG PO TABS
5.0000 mg | ORAL_TABLET | ORAL | Status: DC | PRN
  Administered 2022-06-30 – 2022-07-07 (×31): 5 mg via ORAL
  Filled 2022-06-29 (×31): qty 1

## 2022-06-29 MED ORDER — SUMATRIPTAN SUCCINATE 50 MG PO TABS
25.0000 mg | ORAL_TABLET | ORAL | Status: AC | PRN
  Administered 2022-06-29 – 2022-06-30 (×2): 25 mg via ORAL
  Filled 2022-06-29 (×3): qty 1

## 2022-06-29 MED ORDER — PANTOPRAZOLE SODIUM 40 MG PO TBEC
40.0000 mg | DELAYED_RELEASE_TABLET | Freq: Every day | ORAL | Status: DC
  Administered 2022-06-29 – 2022-07-07 (×9): 40 mg via ORAL
  Filled 2022-06-29 (×9): qty 1

## 2022-06-29 MED ORDER — VANCOMYCIN HCL 1250 MG/250ML IV SOLN
1250.0000 mg | Freq: Three times a day (TID) | INTRAVENOUS | Status: DC
  Administered 2022-06-30 (×2): 1250 mg via INTRAVENOUS
  Filled 2022-06-29 (×3): qty 250

## 2022-06-29 NOTE — Progress Notes (Signed)
[facility] Progress Note  Patient: Marie Mills    [id number]  DOA: 06/27/2022     Date of Service: the patient was seen and examined on 06/29/2022  No chief complaint on file.  Brief hospital course: Marie Mills is a 52 y.o. female with medical history significant for GERD, hypertension, asthma, lumbar radiculopathy and remote seizure disorder, who underwent L3-L4 left hemilaminotomy and microdiscectomy on 2/28 by Dr. Izora Ribas, who presented to the emergency room with acute onset of left groin and thigh pain and numbness.  She has been having mild sinus drainage from her incision.  MRI lumbar spine reviewed The patient was given 10 mg of IV Decadron, 1 mg of IV Dilaudid, 4 mg of IV morphine sulfate and 4 mg of IV Zofran. Contact was made with Dr. Cari Caraway who did not believe there was CSF leak but recommended admission for evaluation and management of her lumbar discitis and holding IV antibiotic therapy pending IR guided biopsy. She will be admitted to a medical bed for further evaluation and management.   Assessment and Plan:  # Postop seroma and possible lumbar discitis - This is mainly at L4-L5 level.  She is status post L3-L4 left hemilaminotomy and microdiscectomy,  - hold off IV antibiotics as per ID and  Neurosurgery recommendation. - Neurosurgery consulted, recommended continue as needed medication for pain control, no further intervention.  Continue Decadron 4 mg every 6 hourly ID consulted, recommended to watch for any signs of infection, hold off antibiotics. ESR 16, CRP <0.5 Blood culture NGTD - Pain management will be provided.    Metabolic acidosis, unknown cause creatinine within normal range Bicarb 18 Started oral bicarbonate Repeat BMP in a.m.    Asthma, chronic - We will continue her inhalers. - We will continue Singulair.   Lumbar radiculopathy - We will continue Neurontin.   Essential hypertension -Resumed Toprol-XL 25 mg p.o. daily and  losartan 50 mg p.o. daily home dose We will continue monitor BP and titrate medication accordingly   Migraine headache, continue Tylenol as needed Imitrex 25 mg 1 dose ordered  Morbid obesity Body mass index is 44.12 kg/m.  Interventions: Lifestyle modification advised, calorie diet and exercise advised to lose body weight       Diet: Regular diet DVT Prophylaxis: Subcutaneous Lovenox   Advance goals of care discussion: Full code  Family Communication: family was not present at bedside, at the time of interview.  The pt provided permission to discuss medical plan with the family. Opportunity was given to ask question and all questions were answered satisfactorily.   Disposition:  Pt is from Home, admitted with back pain and fluid drainage, still has Pain, which precludes a safe discharge. Discharge to Home, when clinically stable, needs neurosurgery and ID clearance.  Subjective: No significant events overnight, patient is still having significant backache, 6/10, patient was also complaining of migraine headache.  Denies any focal weakness or deficit.  No chest pain or palpitation, no shortness of breath.  Physical Exam: General: NAD, lying comfortably Appear in no distress, affect appropriate Eyes: PERRLA ENT: Oral Mucosa Clear, moist  Neck: no JVD,  Cardiovascular: S1 and S2 Present, no Murmur,  Respiratory: good respiratory effort, Bilateral Air entry equal and Decreased, no Crackles, no wheezes Abdomen: Bowel Sound present, Soft and no tenderness,  Skin: no rashes Extremities: no Pedal edema, no calf tenderness Neurologic: without any new focal findings Gait not checked due to patient safety concerns  Vitals:   06/28/22 2004 06/28/22 2316 06/29/22  0433 06/29/22 0810  BP: (!) 161/91 139/76 (!) 148/58 (!) 155/97  Pulse: (!) 114 (!) 119 97 (!) 103  Resp: (!) '22 20 20 18  '$ Temp: 98.4 F (36.9 C) 97.8 F (36.6 C) 97.8 F (36.6 C) (!) 97.5 F (36.4 C)  TempSrc: Oral  Oral    SpO2: 96% 96% 96% 100%  Weight:      Height:        Intake/Output Summary (Last 24 hours) at 06/29/2022 1415 Last data filed at 06/29/2022 0534 Gross per 24 hour  Intake 2529.87 ml  Output --  Net 2529.87 ml   Filed Weights   06/27/22 1842  Weight: 124 kg    Data Reviewed: I have personally reviewed and interpreted daily labs, tele strips, imagings as discussed above. I reviewed all nursing notes, pharmacy notes, vitals, pertinent old records I have discussed plan of care as described above with RN and patient/family.  CBC: Recent Labs  Lab 06/27/22 1845 06/28/22 0904  WBC 8.8 10.7*  NEUTROABS 5.5  --   HGB 11.8* 12.1  HCT 36.8 38.3  MCV 86.8 89.3  PLT 288 [id number]   Basic Metabolic Panel: Recent Labs  Lab 06/27/22 1845 06/28/22 0904  NA 138 136  K 3.6 3.9  CL 110 107  CO2 20* 18*  GLUCOSE 86 140*  BUN 18 14  CREATININE 0.77 0.65  CALCIUM 8.6* 8.7*    Studies: No results found.  Scheduled Meds:  dexamethasone (DECADRON) injection  4 mg Intravenous Q6H   enoxaparin (LOVENOX) injection  0.5 mg/kg Subcutaneous Q24H   losartan  50 mg Oral Daily   metoprolol succinate  25 mg Oral Daily   sodium bicarbonate  650 mg Oral TID   Continuous Infusions:  sodium chloride 75 mL/hr at 06/29/22 1146   PRN Meds: acetaminophen **OR** acetaminophen, HYDROmorphone (DILAUDID) injection, magnesium hydroxide, ondansetron **OR** ondansetron (ZOFRAN) IV, traZODone  Time spent: 35 minutes  Author: Val Riles. MD [facility] 06/29/2022 2:15 PM  To reach On-call, see care teams to locate the attending and reach out to them via [email or web address]. If 7PM-7AM, please contact night-coverage If you still have difficulty reaching the attending provider, please page the [facility] (Director on Call) for [facility] on amion for assistance.

## 2022-06-29 NOTE — Progress Notes (Signed)
Date of Admission:  06/27/2022    ID: Marie Mills is a 52 y.o. 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   H/O microdiscectomy    Subjective: She is feeling a little better Back pain and leg pain better C/o headache and neck pain No fever  Medications:   dexamethasone (DECADRON) injection  4 mg Intravenous Q6H   enoxaparin (LOVENOX) injection  0.5 mg/kg Subcutaneous Q24H   losartan  50 mg Oral Daily   metoprolol succinate  25 mg Oral Daily   sodium bicarbonate  650 mg Oral TID   SUMAtriptan  25 mg Oral Once    Objective: Vital signs in last 24 hours: Patient Vitals for the past 24 hrs:  BP Temp Temp src Pulse Resp SpO2  06/29/22 0810 (!) 155/97 (!) 97.5 F (36.4 C) -- (!) 103 18 100 %  06/29/22 0433 (!) 148/58 97.8 F (36.6 C) -- 97 20 96 %  06/28/22 2316 139/76 97.8 F (36.6 C) Oral (!) 119 20 96 %  06/28/22 2004 (!) 161/91 98.4 F (36.9 C) Oral (!) 114 (!) 22 96 %  06/28/22 1846 (!) 141/71 98.3 F (36.8 C) Oral (!) 114 18 96 %  06/28/22 1647 139/64 98.7 F (37.1 C) Oral (!) 129 20 95 %  06/28/22 1547 137/68 98.8 F (37.1 C) Oral (!) 128 (!) 22 97 %  06/28/22 1540 (!) 142/91 98.2 F (36.8 C) Oral 87 20 100 %      PHYSICAL EXAM:  General: lying in bed with eyes closed,on waking her she talks and is completley oriented Lungs: Clear to auscultation bilaterally. No Wheezing or Rhonchi. No rales. Heart: Tachycardia Abdomen: Soft, non-tender,not distended. Bowel sounds normal. No masses Extremities: atraumatic, no cyanosis. No edema. No clubbing Skin: surgical site covered with dressing Lymph: Cervical, supraclavicular normal. Neurologic: did not assess in detail  Lab Results Recent Labs    06/27/22 1845 06/28/22 0904  WBC 8.8 10.7*  HGB 11.8* 12.1  HCT 36.8 38.3  NA 138 136  K 3.6 3.9  CL 110 107  CO2  20* 18*  BUN 18 14  CREATININE 0.77 0.65   Liver Panel Recent Labs    06/27/22 1845  PROT 7.0  ALBUMIN 3.6  AST 23  ALT 19  ALKPHOS 79  BILITOT 0.6   Sedimentation Rate Recent Labs    06/28/22 0055  ESRSEDRATE 16   C-Reactive Protein Recent Labs    06/28/22 0055  CRP <0.5    Microbiology: BC ng so far  Studies/Results: MR Lumbar Spine W Wo Contrast  Result Date: 06/27/2022 CLINICAL DATA:  Postop, increasing pain, drainage from surgical site EXAM: MRI LUMBAR SPINE WITHOUT AND WITH CONTRAST TECHNIQUE: Multiplanar and multiecho pulse sequences of the lumbar spine were obtained without and with intravenous contrast. CONTRAST:  71m GADAVIST GADOBUTROL 1 MMOL/ML IV SOLN COMPARISON:  02/03/2022 FINDINGS: Segmentation:  Standard. Alignment: No listhesis. Minimal dextrocurvature. Straightening of the normal lumbar lordosis. Vertebrae: No acute fracture or suspicious osseous lesion. Status post L3-L4 left hemilaminotomy and microdiscectomy. Contrast enhancement is noted at the posterior aspect of the L4-L5 disc (series 10, images 8-10). No definite osseous enhancement. Conus medullaris and cauda equina: Conus extends to the L1 level. Conus and cauda equina appear normal. No abnormal nerve root enhancement. Along the dorsal aspect of the thecal sac, there is possible fluid  collection that causes mass effect on the thecal sac, which follow CSF on most sequences and may represent a CSF leak (series 8, image 8). This extends along the dorsal aspect of the thecal sac from approximately T12-L1 through L3-L4. Paraspinal and other soft tissues: Postsurgical changes posterior to L3 and L4, with a fluid collection extending from the skin surface to the posterior, left, and anterior aspect of the thecal sac at the L3-L4 disc level (series 8, image 26), measuring approximately 8.3 x 1.8 x 2.1 cm (AP x TR x CC) (series 8, image 24 and series 5, image 10). Contrast enhancement is noted along this tract  (series 10, image 10, as well as along the left aspect of the L3 and L4 spinous process (series 11, images 26-27). Increased T2 signal and contrast enhancement is also noted in the interspinous ligaments at L2-L3, L3-L4, and L4-L5 (series 7, image 9 and series 10, image 9) and in the bilateral L3-L4 facets (series 10, image 6 and 12 and series 7, image 6 and 12). Disc levels: T12-L1: No significant disc bulge. No spinal canal stenosis or neural foraminal narrowing. L1-L2: No significant disc bulge. There is moderate thecal sac narrowing, secondary to a possible dorsal fluid collection (series 8, image 8). No neural foraminal narrowing. L2-L3: Mild disc bulge. Moderate thecal sac narrowing secondary to a dorsal fluid collection. No neural foraminal narrowing. L3-L4: Status post interval micro discectomy, with no residual disc protrusion/extrusion seen. Bilateral facet effusions. Fluid is noted extending along of the posterior, left, and anterior aspect of the thecal sac, contributing to moderate compression (series 8, image 25). No neural foraminal narrowing. L4-L5: Small central disc protrusion. Mild facet arthropathy. No spinal canal stenosis or neural foraminal narrowing. L5-S1: No significant disc bulge. No spinal canal stenosis or neural foraminal narrowing. IMPRESSION: 1. Status post L3-L4 left hemilaminotomy and microdiscectomy, with a fluid collection extending from the skin surface to the posterior and anterior aspect of the thecal sac at L3-L4, causing moderate thecal sac narrowing. The collection is associated with increased T2 signal and contrast enhancement and may represent a postoperative seroma, but an infected collection cannot be excluded. 2. Increased T2 signal and contrast enhancement in the interspinous ligaments at L2-L3, L3-L4, and L4-L5, as well as in the bilateral L3-L4 facets, concerning for infection, although this could also be post-surgical. 3. Contrast enhancement at the posterior aspect  of the L4-L5 disc, concerning for discitis. 4. Possible epidural fluid collection along the dorsal aspect of the thecal sac from T12-L1 through L3-L4, possibly a CSF leak, which causes additional moderate thecal sac narrowing at L1-L2 and L2-L3. These results were called by telephone at the time of interpretation on 06/27/2022 at 11:53 pm to provider [facility] , who verbally acknowledged these results. Electronically Signed   By: Merilyn Baba M.D.   On: 06/27/2022 23:54     Assessment/Plan: Pt is 2 weeks post L3-L4 hemilaminotomy and microdiscectomy for lumbar radiculopathy. Presenting with severe pain and numbness over left buttock, groin and labia. Has had clear intermittent drainage from the surgical site and was on clindamycin since 06/24/22 prescribed as OP Pt also received rt knee intraarticular steroids on 3/11 The MRI findings of fluid collection at L3-L4 extending to skin, and pssoible epidural collection T12-L1-L3-L4 and interspinous ligament change and facet changes at L3-L4 can all reflect post op changes and Post Op seroma collection as this MRI is done in the early PO period. She has no fever , normal ESR/CRP and WBC  and the surgical site looks good with no signs of infection.  Though Being on clindamycin before presentation may mire the clinical picture, currently there is no compelling evidence to suggest infection but we will keep a close eye . After discussion with neurosurgeon and Intervention radiologist it has been decided not to aspirate any fluid now and also to hold off on antibiotics for now. Will observe her closely-  Today Neurosurgery team epxressed some cloudy fluid and sent for culture   Neurosurgeon started Decadron yesterday to ease the pain and neurological symptoms. Will observe her closely- ? Because of tachycardia will repeat blood cultures Discussed the management with patient and care team

## 2022-06-29 NOTE — Progress Notes (Signed)
    Attending Progress Note  History: Marie Mills is s/p left L3-4 on 06/15/22 presenting with numbness and clear wound drainage and new neurologic symptoms  HD2: improved leg pain but continued numbness and back pain  Physical Exam: Vitals:   06/29/22 0433 06/29/22 0810  BP: (!) 148/58 (!) 155/97  Pulse: 97 (!) 103  Resp: 20 18  Temp: 97.8 F (36.6 C) (!) 97.5 F (36.4 C)  SpO2: 96% 100%    AA Ox3 CNI  Strength:5/5 throughout BLE  Incision draining cloudy fluid   Assessment/Plan:  Marie Mills 52 y.o. female who is s/p L L3/4 microdiscectomy (uncomplicated) who presents with numbness and clear wound drainage   - mobilize - pain control - DVT prophylaxis - PTOT - wound cultured   Cooper Render PA-C Department of Neurosurgery

## 2022-06-29 NOTE — Plan of Care (Signed)

## 2022-06-29 NOTE — Consult Note (Addendum)
Pharmacy Antibiotic Note  Marie Mills is a 52 y.o. female admitted on 06/27/2022 with left groin/thigh pain and numbness and clear wound drainage and new neurologic symptoms. Patient with PMH significant for recent L3-L4 left hemilaminotomy and microdiscectomy on 2/28. PT has been on PO clindamycin since 06/24/22 prescribed as outpatient. Pharmacy has been consulted for vancomycin and meropenem dosing.   Plan: Initiate meropenem 2 gram Q8H in setting of severe PCN allergy Initiate Vancomycin 1000 mg x 1 followed by 1250 mg Q8H  per dosing nomogram and normalized CrCl (will provide LD of 2250 mg) Goal trough 15-20 Will run vancomycin over 3 hours per discussion with ID provider Obtain trough at steady state  Height: '5\' 6"'$  (167.6 cm) Weight: 124 kg (273 lb 5.9 oz) IBW/kg (Calculated) : 59.3  Temp (24hrs), Avg:97.7 F (36.5 C), Min:97.5 F (36.4 C), Max:97.8 F (36.6 C)  Recent Labs  Lab 06/27/22 1845 06/28/22 0904  WBC 8.8 10.7*  CREATININE 0.77 0.65    Estimated Creatinine Clearance: 114.4 mL/min (by C-G formula based on SCr of 0.65 mg/dL).    Allergies  Allergen Reactions   Penicillins Anaphylaxis    Has patient had a PCN reaction causing immediate rash, facial/tongue/throat swelling, SOB or lightheadedness with hypotension: Yes Has patient had a PCN reaction causing severe rash involving mucus membranes or skin necrosis: No Has patient had a PCN reaction that required hospitalization Yes Has patient had a PCN reaction occurring within the last 10 years: No If all of the above answers are "NO", then may proceed with Cephalosporin use.    Codeine Other (See Comments)    Chest Pain   Sulfa Antibiotics Hives    Antimicrobials this admission: 3/13 meropenem >>  3/13 vancomycin >>   Dose adjustments this admission:   Microbiology results: 3/13 BCx: NGTD 3/13: wound cx: sent   Thank you for allowing pharmacy to be a part of this patient's care.  Dorothe Pea,  PharmD, BCPS Clinical Pharmacist   06/29/2022 8:44 PM

## 2022-06-30 ENCOUNTER — Encounter: Payer: Self-pay | Admitting: Neurosurgery

## 2022-06-30 DIAGNOSIS — G9763 Postprocedural seroma of a nervous system organ or structure following a nervous system procedure: Secondary | ICD-10-CM | POA: Diagnosis not present

## 2022-06-30 DIAGNOSIS — M5416 Radiculopathy, lumbar region: Secondary | ICD-10-CM | POA: Diagnosis not present

## 2022-06-30 DIAGNOSIS — M4646 Discitis, unspecified, lumbar region: Secondary | ICD-10-CM | POA: Diagnosis not present

## 2022-06-30 LAB — BASIC METABOLIC PANEL
Anion gap: 7 (ref 5–15)
BUN: 14 mg/dL (ref 6–20)
CO2: 21 mmol/L — ABNORMAL LOW (ref 22–32)
Calcium: 8.4 mg/dL — ABNORMAL LOW (ref 8.9–10.3)
Chloride: 110 mmol/L (ref 98–111)
Creatinine, Ser: 0.6 mg/dL (ref 0.44–1.00)
GFR, Estimated: >60 mL/min (ref >60)
Glucose, Bld: 112 mg/dL — ABNORMAL HIGH (ref 70–99)
Potassium: 3.6 mmol/L (ref 3.5–5.1)
Sodium: 138 mmol/L (ref 135–145)

## 2022-06-30 LAB — CBC
HCT: 35.2 % — ABNORMAL LOW (ref 36.0–46.0)
Hemoglobin: 11.4 g/dL — ABNORMAL LOW (ref 12.0–15.0)
MCH: 28.1 pg (ref 26.0–34.0)
MCHC: 32.4 g/dL (ref 30.0–36.0)
MCV: 86.9 fL (ref 80.0–100.0)
Platelets: 274 10*3/uL (ref 150–400)
RBC: 4.05 MIL/uL (ref 3.87–5.11)
RDW: 14.3 % (ref 11.5–15.5)
WBC: 13.9 10*3/uL — ABNORMAL HIGH (ref 4.0–10.5)
nRBC: 0 % (ref 0.0–0.2)

## 2022-06-30 LAB — MAGNESIUM: Magnesium: 2.1 mg/dL (ref 1.7–2.4)

## 2022-06-30 LAB — PHOSPHORUS: Phosphorus: 2.5 mg/dL (ref 2.5–4.6)

## 2022-06-30 MED ORDER — SUMATRIPTAN SUCCINATE 50 MG PO TABS
25.0000 mg | ORAL_TABLET | ORAL | Status: AC | PRN
  Administered 2022-06-30 – 2022-07-02 (×6): 25 mg via ORAL
  Filled 2022-06-30 (×7): qty 1

## 2022-06-30 MED ORDER — ACETAMINOPHEN 650 MG RE SUPP: 650.0000 mg | Freq: Four times a day (QID) | RECTAL | Status: DC | PRN

## 2022-06-30 MED ORDER — ACETAMINOPHEN 325 MG PO TABS
650.0000 mg | ORAL_TABLET | Freq: Four times a day (QID) | ORAL | Status: DC | PRN
  Administered 2022-06-30 – 2022-07-03 (×4): 650 mg via ORAL
  Filled 2022-06-30 (×4): qty 2

## 2022-06-30 MED ORDER — VANCOMYCIN HCL 1250 MG/250ML IV SOLN
1250.0000 mg | Freq: Two times a day (BID) | INTRAVENOUS | Status: DC
  Administered 2022-06-30 – 2022-07-04 (×8): 1250 mg via INTRAVENOUS
  Filled 2022-06-30 (×9): qty 250

## 2022-06-30 NOTE — Progress Notes (Signed)
[facility] Progress Note  Patient: Marie Mills    [id number]  DOA: 06/27/2022     Date of Service: the patient was seen and examined on 06/30/2022  No chief complaint on file.  Brief hospital course: Marie Mills is a 52 y.o. female with medical history significant for GERD, hypertension, asthma, lumbar radiculopathy and remote seizure disorder, who underwent L3-L4 left hemilaminotomy and microdiscectomy on 2/28 by Dr. Izora Ribas, who presented to the emergency room with acute onset of left groin and thigh pain and numbness.  She has been having mild sinus drainage from her incision.  MRI lumbar spine reviewed The patient was given 10 mg of IV Decadron, 1 mg of IV Dilaudid, 4 mg of IV morphine sulfate and 4 mg of IV Zofran. Contact was made with Dr. Cari Caraway who did not believe there was CSF leak but recommended admission for evaluation and management of her lumbar discitis and holding IV antibiotic therapy pending IR guided biopsy. She will be admitted to a medical bed for further evaluation and management.   Assessment and Plan:  # Postop seroma and possible lumbar discitis - This is mainly at L4-L5 level.  She is status post L3-L4 left hemilaminotomy and microdiscectomy,  - IV antibiotics were held off on admission as per ID and  Neurosurgery recommendation. - Neurosurgery consulted, recommended continue as needed medication for pain control, no further intervention.  S/p Decadron 4 mg q6h d/cd on 3/13 ID consulted, started meropenem and vancomycin, pharmacy consulted for trough monitoring and dosing. ESR 16, CRP <0.5--13.9 Blood culture NGTD Continue pain management WBC  838--13.9 elevated could be due to steroids Follow neurosurgery for washout, most likely will be done tomorrow a.m.    Metabolic acidosis, unknown cause creatinine within normal range Bicarb 18--21 Started oral bicarbonate Repeat BMP in a.m.    Asthma, chronic - We will continue her inhalers. -  We will continue Singulair.   Lumbar radiculopathy - We will continue Neurontin.   Essential hypertension -Resumed Toprol-XL 25 mg p.o. daily and losartan 50 mg p.o. daily home dose We will continue monitor BP and titrate medication accordingly   Migraine headache, continue Tylenol as needed 3/13 Imitrex 25 mg 1 dose given and as needed ordered for night  Morbid obesity Body mass index is 44.12 kg/m.  Interventions: Lifestyle modification advised, calorie diet and exercise advised to lose body weight       Diet: Regular diet DVT Prophylaxis: Subcutaneous Lovenox   Advance goals of care discussion: Full code  Family Communication: family was not present at bedside, at the time of interview.  The pt provided permission to discuss medical plan with the family. Opportunity was given to ask question and all questions were answered satisfactorily.   Disposition:  Pt is from Home, admitted with back pain and fluid drainage, still has Pain and on IV abx, which precludes a safe discharge. Discharge to Home, when clinically stable, needs neurosurgery and ID clearance.  Subjective: No significant events overnight, patient is complaining of pain in the back 4/10, slightly improved, dressing was changed by RN, may still have some drainage. Patient denies any chest pain or palpitation, no shortness of breath.  Physical Exam: General: NAD, lying comfortably Appear in no distress, affect appropriate Eyes: PERRLA ENT: Oral Mucosa Clear, moist  Neck: no JVD,  Cardiovascular: S1 and S2 Present, no Murmur,  Respiratory: good respiratory effort, Bilateral Air entry equal and Decreased, no Crackles, no wheezes Abdomen: Bowel Sound present, Soft and no tenderness,  Skin: no rashes Extremities: no Pedal edema, no calf tenderness Neurologic: without any new focal findings Gait not checked due to patient safety concerns  Vitals:   06/29/22 0810 06/29/22 1554 06/29/22 2330 06/30/22 0728  BP:  (!) 155/97 122/60 (!) 133/57 (!) 151/78  Pulse: (!) 103 91 86 90  Resp: '18 18 20 17  '$ Temp: (!) 97.5 F (36.4 C) 97.7 F (36.5 C) 98.1 F (36.7 C) 97.7 F (36.5 C)  TempSrc:  Oral  Oral  SpO2: 100% 97% 100% 97%  Weight:      Height:        Intake/Output Summary (Last 24 hours) at 06/30/2022 1332 Last data filed at 06/30/2022 0900 Gross per 24 hour  Intake 600 ml  Output 0 ml  Net 600 ml   Filed Weights   06/27/22 1842  Weight: 124 kg    Data Reviewed: I have personally reviewed and interpreted daily labs, tele strips, imagings as discussed above. I reviewed all nursing notes, pharmacy notes, vitals, pertinent old records I have discussed plan of care as described above with RN and patient/family.  CBC: Recent Labs  Lab 06/27/22 1845 06/28/22 0904 06/30/22 0431  WBC 8.8 10.7* 13.9*  NEUTROABS 5.5  --   --   HGB 11.8* 12.1 11.4*  HCT 36.8 38.3 35.2*  MCV 86.8 89.3 86.9  PLT 288 248 [id number]   Basic Metabolic Panel: Recent Labs  Lab 06/27/22 1845 06/28/22 0904 06/30/22 0431  NA 138 136 138  K 3.6 3.9 3.6  CL 110 107 110  CO2 20* 18* 21*  GLUCOSE 86 140* 112*  BUN '18 14 14  '$ CREATININE 0.77 0.65 0.60  CALCIUM 8.6* 8.7* 8.4*  MG  --   --  2.1  PHOS  --   --  2.5    Studies: No results found.  Scheduled Meds:  enoxaparin (LOVENOX) injection  0.5 mg/kg Subcutaneous Q24H   gabapentin  300 mg Oral TID   losartan  50 mg Oral Daily   metoprolol succinate  25 mg Oral Daily   pantoprazole  40 mg Oral Daily   Continuous Infusions:  sodium chloride 75 mL/hr at 06/29/22 1146   meropenem (MERREM) IV 2 g (06/30/22 1316)   vancomycin     PRN Meds: acetaminophen **OR** acetaminophen, albuterol, magnesium hydroxide, methocarbamol, ondansetron **OR** ondansetron (ZOFRAN) IV, oxyCODONE, traZODone  Time spent: 35 minutes  Author: Val Riles. MD [facility] 06/30/2022 1:32 PM  To reach On-call, see care teams to locate the attending and reach out to them via  [email or web address]. If 7PM-7AM, please contact night-coverage If you still have difficulty reaching the attending provider, please page the [facility] (Director on Call) for [facility] on amion for assistance.

## 2022-06-30 NOTE — Progress Notes (Addendum)
    Attending Progress Note  History: Marie Mills is s/p left L3-4 on 06/15/22 presenting with numbness and clear wound drainage and new neurologic symptoms  HD3: continued leg pain and drainage from incision HD2: improved leg pain but continued numbness and back pain  Physical Exam: Vitals:   06/29/22 2330 06/30/22 0728  BP: (!) 133/57 (!) 151/78  Pulse: 86 90  Resp: 20 17  Temp: 98.1 F (36.7 C) 97.7 F (36.5 C)  SpO2: 100% 97%    AA Ox3 CNI  Strength:5/5 throughout BLE  Incision with sutures in place. No obvious active drainage but there is tan colored drainage on bandage  Results: CRP 13.9 on 3/13 Wound cultures: pending  Assessment/Plan:  Marie Mills 52 y.o. female who is s/p L L3/4 microdiscectomy (uncomplicated) who presents with numbness and clear wound drainage   - mobilize - pain control - DVT prophylaxis - PTOT - wound cultured 06/29/22. Results pending   Cooper Render PA-C Department of Neurosurgery   Addendum to above: I saw [name] around 430 pm.  She is feeling a little better.  Cultures grew out klebsiella from her wound, likely inoculated from GI means due to wound drainage.    I recommended washout tomorrow AM.  I discussed the planned procedure at length with the patient, including the risks, benefits, alternatives, and indications. The risks discussed include but are not limited to bleeding, infection, need for reoperation, spinal fluid leak, stroke, vision loss, anesthetic complication, coma, paralysis, and even death. I also described in detail that improvement was not guaranteed.  The patient expressed understanding of these risks, and asked that we proceed with surgery. I described the surgery in layman's terms, and gave ample opportunity for questions, which were answered to the best of my ability.  We will proceed and obtain additional cultures.

## 2022-06-30 NOTE — Plan of Care (Signed)
  Problem: Clinical Measurements: Goal: Will remain free from infection Outcome: Progressing   Problem: Clinical Measurements: Goal: Cardiovascular complication will be avoided Outcome: Progressing   Problem: Elimination: Goal: Will not experience complications related to bowel motility Outcome: Progressing   Problem: Safety: Goal: Ability to remain free from injury will improve Outcome: Progressing

## 2022-06-30 NOTE — Consult Note (Signed)
Pharmacy Antibiotic Note  Marie Mills is a 52 y.o. female admitted on 06/27/2022 with left groin/thigh pain and numbness and clear wound drainage and new neurologic symptoms. Patient with PMH significant for recent L3-L4 left hemilaminotomy and microdiscectomy on 2/28. Patient was on PO clindamycin 06/24/22 outpatient. Pharmacy has been consulted for vancomycin and meropenem dosing.   PCN allergy - documented as anaphylaxis (no documented use of cephalosporin found in [facility])  Neurosurgery expressed fluid from wound 3/13 and has been sent for culture  Today, 06/30/2022 Day #1 vancomycin and meropenem Renal: SCr stable, WNL WBC 13.9 (trending upward) Afebrile Blood cultures NGTD Wound culture growing moderate GNR  Plan: Adjust vancomycin dose to '1250mg'$  IV q12h for a goal AUC 400-600 and vancomycin trough 15-20 mcg/mL Estimated AUC 572, est trough = 16.8 mcg/mL using Vd 0.5 L/kg per BMI > 40 and SCr 0.8 mg/dl Will run vancomycin over 3 hours per pharmacist          discussion with ID provider Continue meropenem 2 gram Q8H in setting of severe PCN allergy Monitor renal function and culture results Check vancomycin levels at once at steady-state and vancomycin to continue > 3-4 days  Height: '5\' 6"'$  (167.6 cm) Weight: 124 kg (273 lb 5.9 oz) IBW/kg (Calculated) : 59.3  Temp (24hrs), Avg:97.8 F (36.6 C), Min:97.7 F (36.5 C), Max:98.1 F (36.7 C)  Recent Labs  Lab 06/27/22 1845 06/28/22 0904 06/30/22 0431  WBC 8.8 10.7* 13.9*  CREATININE 0.77 0.65 0.60     Estimated Creatinine Clearance: 114.4 mL/min (by C-G formula based on SCr of 0.6 mg/dL).    Allergies  Allergen Reactions   Penicillins Anaphylaxis    Has patient had a PCN reaction causing immediate rash, facial/tongue/throat swelling, SOB or lightheadedness with hypotension: Yes Has patient had a PCN reaction causing severe rash involving mucus membranes or skin necrosis: No Has patient had a PCN reaction that required  hospitalization Yes Has patient had a PCN reaction occurring within the last 10 years: No If all of the above answers are "NO", then may proceed with Cephalosporin use.    Codeine Other (See Comments)    Chest Pain   Sulfa Antibiotics Hives    Antimicrobials this admission: 3/13 meropenem >>  3/13 vancomycin >>   Dose adjustments this admission:   Microbiology results: 3/12 Bcx: NGTD 3/13 BCx: NGTD 3/13: wound cx: GNR   Thank you for allowing pharmacy to be a part of this patient's care.  Doreene Eland, PharmD, BCPS, BCIDP Work Cell: [phone] 06/30/2022 8:38 AM

## 2022-06-30 NOTE — Progress Notes (Signed)
Date of Admission:  06/27/2022    ID: Marie Mills is a 52 y.o. 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   H/O microdiscectomy  Pt was started on IV avnco and meropenem on 06/29/22  Subjective: Pt feeling better Talking to her family at bed side Headache and neck pain better   Medications:   enoxaparin (LOVENOX) injection  0.5 mg/kg Subcutaneous Q24H   gabapentin  300 mg Oral TID   losartan  50 mg Oral Daily   metoprolol succinate  25 mg Oral Daily   pantoprazole  40 mg Oral Daily    Objective: Vital signs in last 24 hours: Patient Vitals for the past 24 hrs:  BP Temp Temp src Pulse Resp SpO2  06/30/22 1442 (!) 159/93 97.6 F (36.4 C) -- (!) 101 18 98 %  06/30/22 0728 (!) 151/78 97.7 F (36.5 C) Oral 90 17 97 %  06/29/22 2330 (!) 133/57 98.1 F (36.7 C) -- 86 20 100 %  06/29/22 1554 122/60 97.7 F (36.5 C) Oral 91 18 97 %      PHYSICAL EXAM:  General: awake and alert, cheerful Lungs: Clear to auscultation bilaterally. No Wheezing or Rhonchi. No rales. Heart: Tachycardia Abdomen: Soft, non-tender,not distended. Bowel sounds normal. No masses Extremities: atraumatic, no cyanosis. No edema. No clubbing Skin: surgical site covered with dressing Lymph: Cervical, supraclavicular normal. Neurologic: did not assess in detail  Lab Results Recent Labs    06/28/22 0904 06/30/22 0431  WBC 10.7* 13.9*  HGB 12.1 11.4*  HCT 38.3 35.2*  NA 136 138  K 3.9 3.6  CL 107 110  CO2 18* 21*  BUN 14 14  CREATININE 0.65 0.60   Liver Panel Recent Labs    06/27/22 1845  PROT 7.0  ALBUMIN 3.6  AST 23  ALT 19  ALKPHOS 79  BILITOT 0.6   Sedimentation Rate Recent Labs    06/28/22 0055  ESRSEDRATE 16   C-Reactive Protein Recent Labs    06/28/22 0055 06/29/22 1122  CRP <0.5 13.9*    Microbiology: BC ng so  far WC- klebsiella  Studies/Results: No results found.   Assessment/Plan: Pt is 2 weeks post L3-L4 hemilaminotomy and microdiscectomy for lumbar radiculopathy. Presenting with severe pain and numbness over left buttock, groin and labia. Has had  intermittent drainage from the surgical site and was on clindamycin since 06/24/22 prescribed as OP Pt also received rt knee intraarticular steroids on 3/11 The MRI findings of fluid collection at L3-L4 extending to skin, and pssoible epidural collection T12-L1-L3-L4 and interspinous ligament change and facet changes at L3-L4 can all reflect post op changes and Post Op seroma collection as this MRI is done in the early PO period. She has no fever ,had  normal ESR/CRP and WBC on admission, though Being on clindamycin before presentation may have mired the clinical picture,  there iwas no compelling evidence of infection on admission and hence antibiotic was not started then after making a collective decision- yesterday CRP increased to 13, there was some cloudy discharge which was sent for culture and she was started on vanco and meropenem. Today Klebsiella is growing in the fluid culture  Dr.Yarbrough planning to take her for washout tomorrow and will send deep culture HE stopped decadron last evening.?  PCN allergy as a child- was told by her mom not to take  it We may do cephalosporin later after we have all the microbiological information  Discussed the management with patient, family and care team

## 2022-07-01 ENCOUNTER — Inpatient Hospital Stay: Payer: Medicaid Other | Admitting: Anesthesiology

## 2022-07-01 ENCOUNTER — Encounter
Admission: EM | Disposition: A | Discharge status: Home Health | Payer: Self-pay | Source: Home / Self Care | Attending: Student

## 2022-07-01 ENCOUNTER — Other Ambulatory Visit: Payer: Self-pay

## 2022-07-01 ENCOUNTER — Encounter: Payer: Self-pay | Admitting: Family Medicine

## 2022-07-01 DIAGNOSIS — M4626 Osteomyelitis of vertebra, lumbar region: Secondary | ICD-10-CM | POA: Diagnosis not present

## 2022-07-01 DIAGNOSIS — T8149XA Infection following a procedure, other surgical site, initial encounter: Secondary | ICD-10-CM | POA: Diagnosis not present

## 2022-07-01 DIAGNOSIS — M4646 Discitis, unspecified, lumbar region: Secondary | ICD-10-CM | POA: Diagnosis not present

## 2022-07-01 HISTORY — PX: LUMBAR WOUND DEBRIDEMENT: SHX1988

## 2022-07-01 LAB — CBC
HCT: 39.6 % (ref 36.0–46.0)
Hemoglobin: 12.5 g/dL (ref 12.0–15.0)
MCH: 27.5 pg (ref 26.0–34.0)
MCHC: 31.6 g/dL (ref 30.0–36.0)
MCV: 87.2 fL (ref 80.0–100.0)
Platelets: 290 10*3/uL (ref 150–400)
RBC: 4.54 MIL/uL (ref 3.87–5.11)
RDW: 14.5 % (ref 11.5–15.5)
WBC: 11 10*3/uL — ABNORMAL HIGH (ref 4.0–10.5)
nRBC: 0 % (ref 0.0–0.2)

## 2022-07-01 LAB — BASIC METABOLIC PANEL
Anion gap: 13 (ref 5–15)
BUN: 12 mg/dL (ref 6–20)
CO2: 21 mmol/L — ABNORMAL LOW (ref 22–32)
Calcium: 8.8 mg/dL — ABNORMAL LOW (ref 8.9–10.3)
Chloride: 105 mmol/L (ref 98–111)
Creatinine, Ser: 0.72 mg/dL (ref 0.44–1.00)
GFR, Estimated: >60 mL/min (ref >60)
Glucose, Bld: 98 mg/dL (ref 70–99)
Potassium: 3.6 mmol/L (ref 3.5–5.1)
Sodium: 139 mmol/L (ref 135–145)

## 2022-07-01 LAB — PHOSPHORUS: Phosphorus: 2.3 mg/dL — ABNORMAL LOW (ref 2.5–4.6)

## 2022-07-01 LAB — MAGNESIUM: Magnesium: 1.9 mg/dL (ref 1.7–2.4)

## 2022-07-01 SURGERY — LUMBAR WOUND DEBRIDEMENT
Anesthesia: General | Site: Back

## 2022-07-01 MED ORDER — PROPOFOL 10 MG/ML IV BOLUS
INTRAVENOUS | Status: AC
  Filled 2022-07-01: qty 40

## 2022-07-01 MED ORDER — SUCCINYLCHOLINE CHLORIDE 200 MG/10ML IV SOSY
PREFILLED_SYRINGE | INTRAVENOUS | Status: DC | PRN
  Administered 2022-07-01: 140 mg via INTRAVENOUS

## 2022-07-01 MED ORDER — KETAMINE HCL 10 MG/ML IJ SOLN
INTRAMUSCULAR | Status: DC | PRN
  Administered 2022-07-01: 30 mg via INTRAVENOUS

## 2022-07-01 MED ORDER — HYDROCODONE-ACETAMINOPHEN 7.5-325 MG PO TABS
1.0000 | ORAL_TABLET | Freq: Once | ORAL | Status: AC | PRN
  Administered 2022-07-01: 1 via ORAL
  Filled 2022-07-01: qty 1

## 2022-07-01 MED ORDER — SODIUM CHLORIDE 0.9 % IV SOLN: INTRAVENOUS | Status: DC

## 2022-07-01 MED ORDER — DEXAMETHASONE SODIUM PHOSPHATE 10 MG/ML IJ SOLN
INTRAMUSCULAR | Status: AC
  Filled 2022-07-01: qty 1

## 2022-07-01 MED ORDER — MENTHOL 3 MG MT LOZG: 1.0000 | LOZENGE | OROMUCOSAL | Status: DC | PRN

## 2022-07-01 MED ORDER — ACETAMINOPHEN 160 MG/5ML PO SOLN: 325.0000 mg | ORAL | Status: DC | PRN

## 2022-07-01 MED ORDER — SODIUM CHLORIDE 0.9% FLUSH: 3.0000 mL | INTRAVENOUS | Status: DC | PRN

## 2022-07-01 MED ORDER — DEXAMETHASONE SODIUM PHOSPHATE 10 MG/ML IJ SOLN
INTRAMUSCULAR | Status: DC | PRN
  Administered 2022-07-01: 10 mg via INTRAVENOUS

## 2022-07-01 MED ORDER — K PHOS MONO-SOD PHOS DI & MONO 155-852-130 MG PO TABS
500.0000 mg | ORAL_TABLET | Freq: Four times a day (QID) | ORAL | Status: AC
  Administered 2022-07-01 (×2): 500 mg via ORAL
  Filled 2022-07-01 (×2): qty 2

## 2022-07-01 MED ORDER — FENTANYL CITRATE (PF) 100 MCG/2ML IJ SOLN
25.0000 ug | INTRAMUSCULAR | Status: DC | PRN
  Administered 2022-07-01 (×4): 25 ug via INTRAVENOUS

## 2022-07-01 MED ORDER — PHENYLEPHRINE 80 MCG/ML (10ML) SYRINGE FOR IV PUSH (FOR BLOOD PRESSURE SUPPORT)
PREFILLED_SYRINGE | INTRAVENOUS | Status: AC
  Filled 2022-07-01: qty 10

## 2022-07-01 MED ORDER — ACETAMINOPHEN 325 MG PO TABS: 325.0000 mg | ORAL_TABLET | ORAL | Status: DC | PRN

## 2022-07-01 MED ORDER — ONDANSETRON HCL 4 MG/2ML IJ SOLN
INTRAMUSCULAR | Status: DC | PRN
  Administered 2022-07-01: 4 mg via INTRAVENOUS

## 2022-07-01 MED ORDER — PHENOL 1.4 % MT LIQD: 1.0000 | OROMUCOSAL | Status: DC | PRN

## 2022-07-01 MED ORDER — SODIUM CHLORIDE 0.9% FLUSH
3.0000 mL | Freq: Two times a day (BID) | INTRAVENOUS | Status: DC
  Administered 2022-07-01 – 2022-07-06 (×9): 3 mL via INTRAVENOUS

## 2022-07-01 MED ORDER — MIDAZOLAM HCL 2 MG/2ML IJ SOLN
INTRAMUSCULAR | Status: AC
  Filled 2022-07-01: qty 2

## 2022-07-01 MED ORDER — HYDROMORPHONE HCL 1 MG/ML IJ SOLN
INTRAMUSCULAR | Status: DC | PRN
  Administered 2022-07-01 (×2): .5 mg via INTRAVENOUS

## 2022-07-01 MED ORDER — ONDANSETRON HCL 4 MG/2ML IJ SOLN
INTRAMUSCULAR | Status: AC
  Filled 2022-07-01: qty 2

## 2022-07-01 MED ORDER — LIDOCAINE HCL (PF) 2 % IJ SOLN
INTRAMUSCULAR | Status: AC
  Filled 2022-07-01: qty 5

## 2022-07-01 MED ORDER — 0.9 % SODIUM CHLORIDE (POUR BTL) OPTIME
TOPICAL | Status: DC | PRN
  Administered 2022-07-01: 1000 mL

## 2022-07-01 MED ORDER — PROPOFOL 10 MG/ML IV BOLUS
INTRAVENOUS | Status: DC | PRN
  Administered 2022-07-01: 200 mg via INTRAVENOUS

## 2022-07-01 MED ORDER — LIDOCAINE HCL (CARDIAC) PF 100 MG/5ML IV SOSY
PREFILLED_SYRINGE | INTRAVENOUS | Status: DC | PRN
  Administered 2022-07-01: 100 mg via INTRAVENOUS

## 2022-07-01 MED ORDER — DEXMEDETOMIDINE HCL IN NACL 80 MCG/20ML IV SOLN
INTRAVENOUS | Status: DC | PRN
  Administered 2022-07-01: 8 ug via BUCCAL
  Administered 2022-07-01: 12 ug via BUCCAL

## 2022-07-01 MED ORDER — FENTANYL CITRATE (PF) 100 MCG/2ML IJ SOLN
INTRAMUSCULAR | Status: AC
  Filled 2022-07-01: qty 2

## 2022-07-01 MED ORDER — MIDAZOLAM HCL 2 MG/2ML IJ SOLN
INTRAMUSCULAR | Status: DC | PRN
  Administered 2022-07-01: 2 mg via INTRAVENOUS

## 2022-07-01 MED ORDER — PHENYLEPHRINE HCL (PRESSORS) 10 MG/ML IV SOLN
INTRAVENOUS | Status: DC | PRN
  Administered 2022-07-01 (×2): 120 ug via INTRAVENOUS

## 2022-07-01 MED ORDER — DROPERIDOL 2.5 MG/ML IJ SOLN: 0.6250 mg | Freq: Once | INTRAMUSCULAR | Status: DC | PRN

## 2022-07-01 MED ORDER — HYDROCODONE-ACETAMINOPHEN 7.5-325 MG PO TABS
ORAL_TABLET | ORAL | Status: AC
  Filled 2022-07-01: qty 1

## 2022-07-01 MED ORDER — SURGIRINSE WOUND IRRIGATION SYSTEM - OPTIME: TOPICAL | Status: DC | PRN

## 2022-07-01 MED ORDER — HYDROMORPHONE HCL 1 MG/ML IJ SOLN
INTRAMUSCULAR | Status: AC
  Filled 2022-07-01: qty 1

## 2022-07-01 MED ORDER — ONDANSETRON HCL 4 MG/2ML IJ SOLN: 4.0000 mg | Freq: Once | INTRAMUSCULAR | Status: DC | PRN

## 2022-07-01 MED ORDER — PROPOFOL 10 MG/ML IV BOLUS
INTRAVENOUS | Status: AC
  Filled 2022-07-01: qty 20

## 2022-07-01 MED ORDER — SODIUM CHLORIDE 0.9 % IV SOLN: 250.0000 mL | INTRAVENOUS | Status: DC

## 2022-07-01 MED ORDER — SUCCINYLCHOLINE CHLORIDE 200 MG/10ML IV SOSY
PREFILLED_SYRINGE | INTRAVENOUS | Status: AC
  Filled 2022-07-01: qty 10

## 2022-07-01 MED ORDER — KETAMINE HCL 50 MG/5ML IJ SOSY
PREFILLED_SYRINGE | INTRAMUSCULAR | Status: AC
  Filled 2022-07-01: qty 5

## 2022-07-01 SURGICAL SUPPLY — 51 items
ADH SKN CLS APL DERMABOND .7 (GAUZE/BANDAGES/DRESSINGS)
AGENT HMST KT MTR STRL THRMB (HEMOSTASIS)
APL PRP STRL LF DISP 70% ISPRP (MISCELLANEOUS) ×2
BASIN KIT SINGLE STR (MISCELLANEOUS) ×1 IMPLANT
BUR NEURO DRILL SOFT 3.0X3.8M (BURR) ×1 IMPLANT
CHLORAPREP W/TINT 26 (MISCELLANEOUS) ×1 IMPLANT
CNTNR URN SCR LID CUP LEK RST (MISCELLANEOUS) ×1 IMPLANT
CONT SPEC 4OZ STRL OR WHT (MISCELLANEOUS) ×1
DERMABOND ADVANCED .7 DNX12 (GAUZE/BANDAGES/DRESSINGS) ×1 IMPLANT
DRAPE C ARM PK CFD 31 SPINE (DRAPES) ×1 IMPLANT
DRAPE LAPAROTOMY 100X77 ABD (DRAPES) ×1 IMPLANT
DRAPE MICROSCOPE SPINE 48X150 (DRAPES) ×1 IMPLANT
DRAPE SURG 17X11 SM STRL (DRAPES) ×1 IMPLANT
ELECT EZSTD 165MM 6.5IN (MISCELLANEOUS)
ELECT REM PT RETURN 9FT ADLT (ELECTROSURGICAL) ×1
ELECTRODE EZSTD 165MM 6.5IN (MISCELLANEOUS) IMPLANT
ELECTRODE REM PT RTRN 9FT ADLT (ELECTROSURGICAL) ×1 IMPLANT
GLOVE BIOGEL PI IND STRL 8.5 (GLOVE) ×1 IMPLANT
GLOVE SURG SYN 6.5 ES PF (GLOVE) ×2 IMPLANT
GLOVE SURG SYN 6.5 PF PI (GLOVE) ×2 IMPLANT
GLOVE SURG SYN 8.5  E (GLOVE) ×3
GLOVE SURG SYN 8.5 E (GLOVE) ×3 IMPLANT
GLOVE SURG SYN 8.5 PF PI (GLOVE) ×3 IMPLANT
GLOVE SURG UNDER POLY LF SZ6.5 (GLOVE) ×1 IMPLANT
GLOVE SURG UNDER POLY LF SZ8.5 (GLOVE) ×1 IMPLANT
GOWN SRG LRG LVL 4 IMPRV REINF (GOWNS) ×1 IMPLANT
GOWN SRG XL LVL 3 NONREINFORCE (GOWNS) ×1 IMPLANT
GOWN STRL NON-REIN TWL XL LVL3 (GOWNS) ×2
GOWN STRL REIN LRG LVL4 (GOWNS)
HEMOVAC LRG 3/16 (MISCELLANEOUS) IMPLANT
KIT PREVENA INCISION MGT 13 (CANNISTER) IMPLANT
KIT SPINAL PRONEVIEW (KITS) ×1 IMPLANT
MANIFOLD NEPTUNE II (INSTRUMENTS) ×1 IMPLANT
MARKER SKIN DUAL TIP RULER LAB (MISCELLANEOUS) ×1 IMPLANT
NDL SAFETY ECLIP 18X1.5 (MISCELLANEOUS) ×1 IMPLANT
NS IRRIG 1000ML POUR BTL (IV SOLUTION) ×1 IMPLANT
PACK LAMINECTOMY NEURO (CUSTOM PROCEDURE TRAY) ×1 IMPLANT
PAD ARMBOARD 7.5X6 YLW CONV (MISCELLANEOUS) ×1 IMPLANT
SOLUTION IRRIG SURGIPHOR (IV SOLUTION) IMPLANT
SURGIFLO W/THROMBIN 8M KIT (HEMOSTASIS) ×1 IMPLANT
SUT DVC VLOC 3-0 CL 6 P-12 (SUTURE) ×1 IMPLANT
SUT ETHILON 3-0 (SUTURE) IMPLANT
SUT VIC AB 0 CT1 27 (SUTURE) ×1
SUT VIC AB 0 CT1 27XCR 8 STRN (SUTURE) ×1 IMPLANT
SUT VIC AB 2-0 CT1 18 (SUTURE) ×1 IMPLANT
SWAB CULTURE ESWAB REG 1ML (MISCELLANEOUS) IMPLANT
SYR 10ML LL (SYRINGE) ×2 IMPLANT
SYR 30ML LL (SYRINGE) ×2 IMPLANT
SYR 3ML LL SCALE MARK (SYRINGE) ×1 IMPLANT
TRAP FLUID SMOKE EVACUATOR (MISCELLANEOUS) ×1 IMPLANT
WATER STERILE IRR 1000ML POUR (IV SOLUTION) ×2 IMPLANT

## 2022-07-01 NOTE — Anesthesia Preprocedure Evaluation (Signed)
Anesthesia Evaluation  Patient identified by MRN, date of birth, ID band Patient awake    Reviewed: Allergy & Precautions, NPO status , Patient's Chart, lab work & pertinent test results  Airway Mallampati: III  TM Distance: >3 FB Neck ROM: full    Dental  (+) Chipped   Pulmonary asthma , pneumonia, resolved Occ inhaler when "sick"   Pulmonary exam normal        Cardiovascular hypertension, negative cardio ROS Normal cardiovascular exam     Neuro/Psych  Headaches, Seizures -,   Anxiety Depression     Neuromuscular disease  negative psych ROS   GI/Hepatic Neg liver ROS,GERD  Medicated and Controlled,,  Endo/Other  negative endocrine ROS    Renal/GU      Musculoskeletal   Abdominal   Peds  Hematology negative hematology ROS (+)   Anesthesia Other Findings Past Medical History: No date: Asthma 08/29/2013: BV (bacterial vaginosis) 03/21/2013: Contraceptive management No date: GERD (gastroesophageal reflux disease) No date: Hypertension No date: Lumbar radiculopathy 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     Comment:  a.) properative PCR (+) prior to LEFT L3-4               MICRODISCECTOMY No date: Obesity No date: Pneumonia No date: Seizure [facility])     Comment:  age 50-3; unknown cause nor if on medication  Past Surgical History: 1987: CHOLECYSTECTOMY 07/30/2021: COLONOSCOPY WITH PROPOFOL; N/A     Comment:  Procedure: COLONOSCOPY WITH PROPOFOL;  Surgeon: Eloise Harman, DO;  Location: [facility];  Service:               Endoscopy;  Laterality: N/A;  2:45pm 06/15/2022: LUMBAR LAMINECTOMY/DECOMPRESSION MICRODISCECTOMY; Left     Comment:  Procedure: LEFT L3-4 MICRODISCECTOMY;  Surgeon:               Meade Maw, MD;  Location: [facility] ORS;  Service:               Neurosurgery;  Laterality: Left; 07/30/2021: POLYPECTOMY     Comment:  Procedure: POLYPECTOMY;  Surgeon: Eloise Harman, DO;               Location: [facility];  Service: Endoscopy;;  BMI    Body Mass Index: 44.12 kg/m      Reproductive/Obstetrics negative OB ROS                             Anesthesia Physical Anesthesia Plan  ASA: 3  Anesthesia Plan: General ETT   Post-op Pain Management:    Induction: Intravenous  PONV Risk Score and Plan: Ondansetron, Dexamethasone, Midazolam and Treatment may vary due to age or medical condition  Airway Management Planned: Oral ETT  Additional Equipment:   Intra-op Plan:   Post-operative Plan: Extubation in OR  Informed Consent: I have reviewed the patients History and Physical, chart, labs and discussed the procedure including the risks, benefits and alternatives for the proposed anesthesia with the patient or authorized representative who has indicated his/her understanding and acceptance.     Dental Advisory Given  Plan Discussed with: Anesthesiologist, CRNA and Surgeon  Anesthesia Plan Comments: (Patient consented for risks of anesthesia including but not limited to:  - adverse reactions to medications - damage to eyes, teeth, lips or other oral mucosa - nerve damage due to positioning  - sore throat or  hoarseness - Damage to heart, brain, nerves, lungs, other parts of body or loss of life  Patient voiced understanding.)       Anesthesia Quick Evaluation

## 2022-07-01 NOTE — Interval H&P Note (Signed)
History and Physical Interval Note:  07/01/2022 7:01 AM  Marie Mills  has presented today for surgery, with the diagnosis of wound infection.  The various methods of treatment have been discussed with the patient and family. After consideration of risks, benefits and other options for treatment, the patient has consented to  Procedure(s): LUMBAR WOUND DEBRIDEMENT (N/A) as a surgical intervention.  The patient's history has been reviewed, patient examined, no change in status, stable for surgery.  I have reviewed the patient's chart and labs.  Questions were answered to the patient's satisfaction.    Heart sounds normal no MRG. Chest Clear to Auscultation Bilaterally.   Yancarlos Berthold

## 2022-07-01 NOTE — Anesthesia Postprocedure Evaluation (Signed)
Anesthesia Post Note  Patient: Marie Mills  Procedure(s) Performed: LUMBAR WOUND DEBRIDEMENT (Back)  Patient location during evaluation: PACU Anesthesia Type: General Level of consciousness: awake and alert Pain management: pain level controlled Vital Signs Assessment: post-procedure vital signs reviewed and stable Respiratory status: spontaneous breathing, nonlabored ventilation and respiratory function stable Cardiovascular status: blood pressure returned to baseline and stable Postop Assessment: no apparent nausea or vomiting Anesthetic complications: no  No notable events documented.   Last Vitals:  Vitals:   07/01/22 0930 07/01/22 0955  BP: 132/65 (!) 144/74  Pulse: 74   Resp: 14 18  Temp: (!) 36.1 C 36.6 C  SpO2: 95% 97%    Last Pain:  Vitals:   07/01/22 0955  TempSrc: Oral  PainSc: 2                  Alphonsus Sias

## 2022-07-01 NOTE — Anesthesia Procedure Notes (Signed)
Procedure Name: Intubation Date/Time: 07/01/2022 7:30 AM  Performed by: Esaw Grandchild, CRNAPre-anesthesia Checklist: Patient identified, Emergency Drugs available, Suction available and Patient being monitored Patient Re-evaluated:Patient Re-evaluated prior to induction Oxygen Delivery Method: Circle system utilized Preoxygenation: Pre-oxygenation with 100% oxygen Induction Type: IV induction Ventilation: Mask ventilation without difficulty Laryngoscope Size: Miller and 2 Tube type: Oral Tube size: 7.0 mm Number of attempts: 1 Airway Equipment and Method: Stylet and LTA kit utilized Placement Confirmation: ETT inserted through vocal cords under direct vision, positive ETCO2 and breath sounds checked- equal and bilateral Secured at: 21 cm Tube secured with: Tape Dental Injury: Teeth and Oropharynx as per pre-operative assessment

## 2022-07-01 NOTE — Consult Note (Signed)
Pharmacy Antibiotic Note  Marie Mills is a 52 y.o. female admitted on 06/27/2022 with left groin/thigh pain and numbness and clear wound drainage and new neurologic symptoms. Patient with PMH significant for recent L3-L4 left hemilaminotomy and microdiscectomy on 2/28. Patient was on PO clindamycin 06/24/22 outpatient. Pharmacy has been consulted for vancomycin and meropenem dosing.   PCN allergy - documented as anaphylaxis (no documented use of cephalosporin found in [facility])  Neurosurgery expressed fluid from wound 3/13 and has been sent for culture  Today, 07/01/2022 Day #2 vancomycin and meropenem Renal: SCr stable, WNL WBC 11 (improved) Afebrile Blood cultures NGTD Wound culture growing moderate GNR - K pneumonia I&D in OR 3/15 - + purulence upon opening wound - cultures pending  Plan: Continue vancomycin dose to 1250mg  IV q12h for a goal AUC 400-600 and vancomycin trough 15-20 mcg/mL Estimated AUC 572, est trough = 16.8 mcg/mL using Vd 0.5 L/kg per BMI > 40 and SCr 0.8 mg/dl Will run vancomycin over 3 hours per pharmacist          discussion with ID provider Continue meropenem 2 gram Q8H in setting of severe PCN allergy Monitor renal function and culture results Check vancomycin levels at once at steady-state and vancomycin to continue > 3-4 days.  Based on current culture without gram positive organism, vancomycin may not be continued long  Height: 5\' 6"  (167.6 cm) Weight: 124 kg (273 lb 5.9 oz) IBW/kg (Calculated) : 59.3  Temp (24hrs), Avg:97.5 F (36.4 C), Min:97 F (36.1 C), Max:98 F (36.7 C)  Recent Labs  Lab 06/27/22 1845 06/28/22 0904 06/30/22 0431 07/01/22 1013  WBC 8.8 10.7* 13.9* 11.0*  CREATININE 0.77 0.65 0.60 0.72     Estimated Creatinine Clearance: 114.4 mL/min (by C-G formula based on SCr of 0.72 mg/dL).    Allergies  Allergen Reactions   Penicillins Anaphylaxis    Has patient had a PCN reaction causing immediate rash, facial/tongue/throat swelling,  SOB or lightheadedness with hypotension: Yes Has patient had a PCN reaction causing severe rash involving mucus membranes or skin necrosis: No Has patient had a PCN reaction that required hospitalization Yes Has patient had a PCN reaction occurring within the last 10 years: No If all of the above answers are "NO", then may proceed with Cephalosporin use.    Codeine Other (See Comments)    Chest Pain   Sulfa Antibiotics Hives    Antimicrobials this admission: 3/13 meropenem >>  3/13 vancomycin >>   Dose adjustments this admission:   Microbiology results: 3/12 Bcx: NGTD 3/13 BCx: NGTD 3/13: wound cx: GNR - K pneumo 3/15 OR cx:    Thank you for allowing pharmacy to be a part of this patient's care.  Doreene Eland, PharmD, BCPS, BCIDP Work Cell: [phone] 07/01/2022 10:45 AM

## 2022-07-01 NOTE — Op Note (Signed)
Indications: Ms. Swezey presented to the hospital with worsening symptoms and concern for possible postoperative issue.  During initial workup, MRI scan showed an epidural fluid collection but no obvious infection.  Her inflammatory markers were initially very low, but then spiked during her hospitalization.  She then developed wound drainage that appeared to be purulent.  Because of this, a wound infection was diagnosed.  She was brought to the operating room for wound washout.  Findings: Wound infection  Preoperative Diagnosis: Postoperative wound infection postoperative Diagnosis: same   EBL: 15 ml IVF: see AR ml Drains: 1 placed Disposition: Extubated and Stable to PACU Complications: none  No foley catheter was placed.   Preoperative Note:   Risks of surgery discussed include: infection, bleeding, stroke, coma, death, paralysis, CSF leak, nerve/spinal cord injury, numbness, tingling, weakness, complex regional pain syndrome, recurrent stenosis and/or disc herniation, vascular injury, development of instability, neck/back pain, need for further surgery, persistent symptoms, development of deformity, and the risks of anesthesia. The patient understood these risks and agreed to proceed.  Operative Note:  1.  Irrigation and debridement of lumbar wound with deep debridement deep to the fascia.   The patient was brought to the Operating Room, intubated and turned into the prone position. All pressure points were checked and double checked.  Her prior incision was identified and cleaned.  The sutures were removed.  It was then prepped and draped in standard fashion.  She was on therapeutic antibiotics.  A timeout was performed.  The incision was opened and extended superiorly.  Soft tissue's were divided.  Purulence was noted.  This was cultured superficially.  Self-retaining retractor was placed.  The deep fascia was opened.  A deep culture was taken.  The epidural space was also  cultured and fluid sent off from that area.  After cultures were taken, the wound was copiously irrigated with antibiotic containing solution and with plain irrigation.  Using a 10 blade, the portion of skin that was not healing well was excised superficially.  We also performed deep excision of the soft tissues both superior to and deep to the lumbodorsal fascia.  Hemostasis was then achieved.  Deep drain was placed.  We then closed the wound in layers with 3-0 nylon on the skin.  The patient was then handed back to anesthesia for extubation.  All counts were correct x 2 at the end the case.  Cooper Render PA assisted in the entire procedure. An assistant was required for this procedure due to the complexity.  The assistant provided assistance in tissue manipulation and suction, and was required for the successful and safe performance of the procedure. I performed the critical portions of the procedure.   Meade Maw MD

## 2022-07-01 NOTE — Transfer of Care (Signed)
Immediate Anesthesia Transfer of Care Note  Patient: Marie Mills  Procedure(s) Performed: LUMBAR WOUND DEBRIDEMENT (Back)  Patient Location: PACU  Anesthesia Type:General  Level of Consciousness: awake, alert , and oriented  Airway & Oxygen Therapy: Patient Spontanous Breathing and Patient connected to face mask oxygen  Post-op Assessment: Report given to RN, Post -op Vital signs reviewed and stable, and Patient moving all extremities  Post vital signs: Reviewed and stable  Last Vitals:  Vitals Value Taken Time  BP 114/64 07/01/22 0832  Temp    Pulse 77 07/01/22 0837  Resp 13 07/01/22 0837  SpO2 100 % 07/01/22 0837  Vitals shown include unvalidated device data.  Last Pain:  Vitals:   07/01/22 0649  TempSrc: Oral  PainSc: 0-No pain      Patients Stated Pain Goal: 0 ([id number])  Complications: No notable events documented.

## 2022-07-01 NOTE — Progress Notes (Addendum)
[facility] Progress Note  Patient: Marie Mills    [id number]  DOA: 06/27/2022     Date of Service: the patient was seen and examined on 07/01/2022  No chief complaint on file.  Brief hospital course: Marie Mills is a 52 y.o. female with medical history significant for GERD, hypertension, asthma, lumbar radiculopathy and remote seizure disorder, who underwent L3-L4 left hemilaminotomy and microdiscectomy on 2/28 by Dr. Izora Ribas, who presented to the emergency room with acute onset of left groin and thigh pain and numbness.  She has been having mild sinus drainage from her incision.  MRI lumbar spine reviewed The patient was given 10 mg of IV Decadron, 1 mg of IV Dilaudid, 4 mg of IV morphine sulfate and 4 mg of IV Zofran. Contact was made with Dr. Cari Caraway who did not believe there was CSF leak but recommended admission for evaluation and management of her lumbar discitis and holding IV antibiotic therapy pending IR guided biopsy. She will be admitted to a medical bed for further evaluation and management.   Assessment and Plan:  # Postop seroma and possible lumbar discitis - This is mainly at L4-L5 level.  She is status post L3-L4 left hemilaminotomy and microdiscectomy,  - IV antibiotics were held off on admission as per ID and  Neurosurgery recommendation. - Neurosurgery consulted, S/p Decadron 4 mg q6h d/cd on 3/13, continue as needed pain medications ID consulted, started meropenem and vancomycin, pharmacy consulted for trough monitoring and dosing. ESR 16, CRP <0.5--13.9 Blood culture NGTD Continue pain management WBC  838--13.9 elevated could be due to steroids 3/15 s/p washout was done by neurosurgery, fluid collection was sent for culture    Metabolic acidosis, unknown cause creatinine within normal range Bicarb 18--21 Started oral bicarbonate Repeat BMP in a.m.   Hypophosphatemia, Phos repleted. Monitor electrolytes and replete as needed.  Asthma,  chronic - We will continue her inhalers. - We will continue Singulair.   Lumbar radiculopathy - We will continue Neurontin.   Essential hypertension -Resumed Toprol-XL 25 mg p.o. daily and losartan 50 mg p.o. daily home dose We will continue monitor BP and titrate medication accordingly   Migraine headache, continue Tylenol as needed 3/13 Imitrex 25 mg 1 dose given and as needed ordered   Morbid obesity Body mass index is 44.12 kg/m.  Interventions: Lifestyle modification advised, calorie diet and exercise advised to lose body weight       Diet: Regular diet DVT Prophylaxis: Subcutaneous Lovenox   Advance goals of care discussion: Full code  Family Communication: family was not present at bedside, at the time of interview.  The pt provided permission to discuss medical plan with the family. Opportunity was given to ask question and all questions were answered satisfactorily.   Disposition:  Pt is from Home, admitted with back pain and fluid drainage, still has Pain and on IV abx, which precludes a safe discharge. Discharge to Home, when clinically stable, needs neurosurgery and ID clearance.  Subjective: No significant events overnight, patient was seen after spinal wound washout done by general surgery.  Patient stated that pain is under control it is 5/10 and feeling burning sensation.  No any other active issues.  Patient was alert but sleepy.  Physical Exam: General: NAD, lying comfortably Appear in no distress, affect appropriate Eyes: PERRLA ENT: Oral Mucosa Clear, moist  Neck: no JVD,  Cardiovascular: S1 and S2 Present, no Murmur,  Respiratory: good respiratory effort, Bilateral Air entry equal and Decreased, no Crackles, no wheezes  Abdomen: Bowel Sound present, Soft and no tenderness,  Skin: no rashes Extremities: no Pedal edema, no calf tenderness Neurologic: without any new focal findings Gait not checked due to patient safety concerns  Vitals:   07/01/22  0915 07/01/22 0930 07/01/22 0955 07/01/22 1515  BP: 127/73 132/65 (!) 144/74   Pulse: 81 74    Resp: 13 14 18 18   Temp: (!) 97.1 F (36.2 C) (!) 97 F (36.1 C) 97.8 F (36.6 C)   TempSrc:   Oral   SpO2: 97% 95% 97%   Weight:      Height:        Intake/Output Summary (Last 24 hours) at 07/01/2022 1520 Last data filed at 07/01/2022 0815 Gross per 24 hour  Intake 1061.77 ml  Output 15 ml  Net 1046.77 ml   Filed Weights   06/27/22 1842 07/01/22 0649  Weight: 124 kg 124 kg    Data Reviewed: I have personally reviewed and interpreted daily labs, tele strips, imagings as discussed above. I reviewed all nursing notes, pharmacy notes, vitals, pertinent old records I have discussed plan of care as described above with RN and patient/family.  CBC: Recent Labs  Lab 06/27/22 1845 06/28/22 0904 06/30/22 0431 07/01/22 1013  WBC 8.8 10.7* 13.9* 11.0*  NEUTROABS 5.5  --   --   --   HGB 11.8* 12.1 11.4* 12.5  HCT 36.8 38.3 35.2* 39.6  MCV 86.8 89.3 86.9 87.2  PLT 288 248 274 [id number]   Basic Metabolic Panel: Recent Labs  Lab 06/27/22 1845 06/28/22 0904 06/30/22 0431 07/01/22 1013  NA 138 136 138 139  K 3.6 3.9 3.6 3.6  CL 110 107 110 105  CO2 20* 18* 21* 21*  GLUCOSE 86 140* 112* 98  BUN 18 14 14 12   CREATININE 0.77 0.65 0.60 0.72  CALCIUM 8.6* 8.7* 8.4* 8.8*  MG  --   --  2.1 1.9  PHOS  --   --  2.5 2.3*    Studies: No results found.  Scheduled Meds:  enoxaparin (LOVENOX) injection  0.5 mg/kg Subcutaneous Q24H   gabapentin  300 mg Oral TID   losartan  50 mg Oral Daily   metoprolol succinate  25 mg Oral Daily   pantoprazole  40 mg Oral Daily   Continuous Infusions:  sodium chloride 75 mL/hr at 07/01/22 0710   meropenem (MERREM) IV 2 g (07/01/22 1409)   vancomycin 83.3 mL/hr at 06/30/22 1706   PRN Meds: acetaminophen **OR** acetaminophen, albuterol, magnesium hydroxide, methocarbamol, ondansetron **OR** ondansetron (ZOFRAN) IV, oxyCODONE, SUMAtriptan,  traZODone  Time spent: 35 minutes  Author: Val Riles. MD [facility] 07/01/2022 3:20 PM  To reach On-call, see care teams to locate the attending and reach out to them via [email or web address]. If 7PM-7AM, please contact night-coverage If you still have difficulty reaching the attending provider, please page the [facility] (Director on Call) for [facility] on amion for assistance.

## 2022-07-01 NOTE — Progress Notes (Signed)
Date of Admission:  06/27/2022    ID: Marie Mills is a 52 y.o. 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   H/O microdiscectomy   Postoperative wound infection    Subjective: Pt had surgery today I/d She is in bed and doing okay Some pain post surgery   Medications:   enoxaparin (LOVENOX) injection  0.5 mg/kg Subcutaneous Q24H   gabapentin  300 mg Oral TID   losartan  50 mg Oral Daily   metoprolol succinate  25 mg Oral Daily   pantoprazole  40 mg Oral Daily   sodium chloride flush  3 mL Intravenous Q12H    Objective: Vital signs in last 24 hours: Patient Vitals for the past 24 hrs:  BP Temp Temp src Pulse Resp SpO2 Height Weight  07/01/22 1654 139/72 97.8 F (36.6 C) -- 97 18 98 % -- --  07/01/22 1600 -- -- -- -- 16 -- -- --  07/01/22 1515 -- -- -- -- 18 -- -- --  07/01/22 0955 (!) 144/74 97.8 F (36.6 C) Oral -- 18 97 % -- --  07/01/22 0930 132/65 (!) 97 F (36.1 C) -- 74 14 95 % -- --  07/01/22 0915 127/73 (!) 97.1 F (36.2 C) -- 81 13 97 % -- --  07/01/22 0910 -- -- -- 91 12 97 % -- --  07/01/22 0900 (!) 144/63 -- -- 96 (!) 22 98 % -- --  07/01/22 0845 135/67 -- -- 89 20 100 % -- --  07/01/22 0832 114/64 97.7 F (36.5 C) -- 86 17 99 % -- --  07/01/22 0649 135/72 98 F (36.7 C) Oral 85 16 -- 5\' 6"  (1.676 m) 124 kg  06/30/22 2212 (!) 153/74 -- -- (!) 102 18 100 % -- --      PHYSICAL EXAM:  General: awake and alert, cheerful Lungs: Clear to auscultation bilaterally. No Wheezing or Rhonchi. No rales. Heart: Tachycardia Abdomen: Soft, non-tender,not distended. Bowel sounds normal. No masses Extremities: atraumatic, no cyanosis. No edema. No clubbing Skin: surgical site covered with dressing Lymph: Cervical, supraclavicular normal. Neurologic: did not assess in detail Back has drain Lab Results Recent Labs     06/30/22 0431 07/01/22 1013  WBC 13.9* 11.0*  HGB 11.4* 12.5  HCT 35.2* 39.6  NA 138 139  K 3.6 3.6  CL 110 105  CO2 21* 21*  BUN 14 12  CREATININE 0.60 0.72   Liver Panel No results for input(s): "PROT", "ALBUMIN", "AST", "ALT", "ALKPHOS", "BILITOT", "BILIDIR", "IBILI" in the last 72 hours.  Sedimentation Rate No results for input(s): "ESRSEDRATE" in the last 72 hours.  C-Reactive Protein Recent Labs    06/29/22 1122  CRP 13.9*    Microbiology: BC ng so far WC- klebsiella     Assessment/Plan: Pt is 2 weeks post L3-L4 hemilaminotomy and microdiscectomy for lumbar radiculopathy. Presenting with severe pain and numbness over left buttock, groin and labia. Has had  intermittent drainage from the surgical site and was on clindamycin since 06/24/22 prescribed as OP Pt also received rt knee intraarticular steroids on 3/11 The MRI findings of fluid collection at L3-L4 extending to skin, and pssoible epidural collection T12-L1-L3-L4 and interspinous ligament change and facet changes at L3-L4 can all reflect post op changes and Post Op seroma collection as this MRI is done in the early PO period. She  had no fever ,had  normal ESR/CRP and WBC on admission, though Being on clindamycin before presentation may have mired the clinical picture,  there was no compelling evidence of infection on admission and hence antibiotic was not started then after making a collective decision- the next day CRP increased to 13, there was some cloudy discharge which was sent for culture and she was started on vanco and meropenem.  Klebsiella is growing in the fluid culture She had washout today and multiple cultures sent ?  PCN allergy as a child- was told by her mom not to take it We may do cephalosporin later after we have all the microbiological information  Discussed the management with patient,  and care team [facility] available by phone this weekend for urgent issues

## 2022-07-02 ENCOUNTER — Other Ambulatory Visit: Payer: Self-pay | Admitting: Orthopedic Surgery

## 2022-07-02 ENCOUNTER — Encounter: Payer: Self-pay | Admitting: Neurosurgery

## 2022-07-02 DIAGNOSIS — M4646 Discitis, unspecified, lumbar region: Secondary | ICD-10-CM | POA: Diagnosis not present

## 2022-07-02 LAB — CBC
HCT: 34 % — ABNORMAL LOW (ref 36.0–46.0)
Hemoglobin: 11.1 g/dL — ABNORMAL LOW (ref 12.0–15.0)
MCH: 28.1 pg (ref 26.0–34.0)
MCHC: 32.6 g/dL (ref 30.0–36.0)
MCV: 86.1 fL (ref 80.0–100.0)
Platelets: 292 10*3/uL (ref 150–400)
RBC: 3.95 MIL/uL (ref 3.87–5.11)
RDW: 14 % (ref 11.5–15.5)
WBC: 11.1 10*3/uL — ABNORMAL HIGH (ref 4.0–10.5)
nRBC: 0 % (ref 0.0–0.2)

## 2022-07-02 LAB — PHOSPHORUS: Phosphorus: 3.4 mg/dL (ref 2.5–4.6)

## 2022-07-02 LAB — BASIC METABOLIC PANEL
Anion gap: 8 (ref 5–15)
BUN: 15 mg/dL (ref 6–20)
CO2: 22 mmol/L (ref 22–32)
Calcium: 8.4 mg/dL — ABNORMAL LOW (ref 8.9–10.3)
Chloride: 108 mmol/L (ref 98–111)
Creatinine, Ser: 0.69 mg/dL (ref 0.44–1.00)
GFR, Estimated: >60 mL/min (ref >60)
Glucose, Bld: 97 mg/dL (ref 70–99)
Potassium: 3 mmol/L — ABNORMAL LOW (ref 3.5–5.1)
Sodium: 138 mmol/L (ref 135–145)

## 2022-07-02 LAB — MAGNESIUM: Magnesium: 2 mg/dL (ref 1.7–2.4)

## 2022-07-02 MED ORDER — SUMATRIPTAN SUCCINATE 50 MG PO TABS
25.0000 mg | ORAL_TABLET | ORAL | Status: AC | PRN
  Administered 2022-07-02 – 2022-07-03 (×2): 25 mg via ORAL
  Filled 2022-07-02 (×2): qty 1

## 2022-07-02 MED ORDER — SACCHAROMYCES BOULARDII 250 MG PO CAPS
250.0000 mg | ORAL_CAPSULE | Freq: Two times a day (BID) | ORAL | Status: DC
  Administered 2022-07-02 – 2022-07-07 (×10): 250 mg via ORAL
  Filled 2022-07-02 (×11): qty 1

## 2022-07-02 MED ORDER — POTASSIUM CHLORIDE CRYS ER 20 MEQ PO TBCR
40.0000 meq | EXTENDED_RELEASE_TABLET | ORAL | Status: AC
  Administered 2022-07-02 (×2): 40 meq via ORAL
  Filled 2022-07-02 (×2): qty 2

## 2022-07-02 MED ORDER — LOPERAMIDE HCL 2 MG PO CAPS
2.0000 mg | ORAL_CAPSULE | Freq: Three times a day (TID) | ORAL | Status: AC | PRN
  Administered 2022-07-02 – 2022-07-03 (×2): 2 mg via ORAL
  Filled 2022-07-02 (×2): qty 1

## 2022-07-02 NOTE — Evaluation (Signed)
Physical Therapy Evaluation Patient Details Name: Marie Mills MRN: [id number] DOB: February 10, 1971 Today's Date: 07/02/2022  History of Present Illness  Marie Mills is a 52 y.o. female with medical history significant for GERD, hypertension, asthma, lumbar radiculopathy and remote seizure disorder, who underwent L3-L4 left hemilaminotomy and microdiscectomy on 2/28 by Dr. Izora Ribas, who presented to the emergency room with acute onset of left groin and thigh pain and numbness.  She has been having mild drainage from her incision.  Clinical Impression  Pt was able to show functional strength and mobility on PT exam.  She reports she was doing very well for the first week post surgery but started having return of L LE pain along with increasing back pain.  She did not need assist to rise to standing or ambulate ~65 ft but did fatigue relatively quickly and HR got to ~140.  Appears she has some medical issues that need addressed but she should be able to mange at home once medically cleared for d/c.  Pt showed good effort, but was L LE and back pain limited with more prolonged activity and fatigued much quicker with activity today than she had been able to recently.       Recommendations for follow up therapy are one component of a multi-disciplinary discharge planning process, led by the attending physician.  Recommendations may be updated based on patient status, additional functional criteria and insurance authorization.  Follow Up Recommendations Follow physician's recommendations for discharge plan and follow up therapies      Assistance Recommended at Discharge    Patient can return home with the following  Assistance with cooking/housework;Assist for transportation;A little help with bathing/dressing/bathroom;A little help with walking and/or transfers    Equipment Recommendations None recommended by PT  Recommendations for Other Services       Functional Status Assessment Patient has  had a recent decline in their functional status and demonstrates the ability to make significant improvements in function in a reasonable and predictable amount of time.     Precautions / Restrictions Precautions Precautions: Back;Fall Precaution Booklet Issued: No Precaution Comments: wound vac Restrictions Weight Bearing Restrictions: No      Mobility  Bed Mobility               General bed mobility comments: NT, pt received/left in recliner    Transfers Overall transfer level: Needs assistance Equipment used: Rolling walker (2 wheels) Transfers: Sit to/from Stand Sit to Stand: Supervision           General transfer comment: cuing for set up, able to rise w/o physical assist, guarded    Ambulation/Gait Ambulation/Gait assistance: Min guard Gait Distance (Feet): 65 Feet Assistive device: Rolling walker (2 wheels)         General Gait Details: Pt c/o L LE pain ("like before surgery") that - displays appropriate strength and coordination for safe WBing/ambulation but hesistant and guarded with more prolonged time in standing  Stairs            Wheelchair Mobility    Modified Rankin (Stroke Patients Only)       Balance Overall balance assessment: Needs assistance Sitting-balance support: Feet supported Sitting balance-Leahy Scale: Normal     Standing balance support: Bilateral upper extremity supported, Single extremity supported, Reliant on assistive device for balance Standing balance-Leahy Scale: Good  Pertinent Vitals/Pain Pain Assessment Pain Assessment: 0-10 Pain Score: 5  Pain Location: back Pain Intervention(s): Limited activity within patient's tolerance    Home Living Family/patient expects to be discharged to:: Private residence Living Arrangements: Children;Spouse/significant other Available Help at Discharge: Family;Available 24 hours/day Type of Home: House Home Access: Level  entry       Home Layout: Two level;Able to live on main level with bedroom/bathroom Home Equipment: Tub bench;Shower Land (2 wheels)      Prior Function Prior Level of Function : Independent/Modified Independent;Driving             Mobility Comments: using RW recently, denies history of falls ADLs Comments: IND for ADLs/IADLs. Has not been able to work since August 2/2 back pain, driving.     Hand Dominance        Extremity/Trunk Assessment   Upper Extremity Assessment Upper Extremity Assessment: Overall WFL for tasks assessed    Lower Extremity Assessment Lower Extremity Assessment: Overall WFL for tasks assessed    Cervical / Trunk Assessment Cervical / Trunk Assessment: Back Surgery  Communication   Communication: No difficulties  Cognition Arousal/Alertness: Awake/alert Behavior During Therapy: WFL for tasks assessed/performed Overall Cognitive Status: Within Functional Limits for tasks assessed                                          General Comments General comments (skin integrity, edema, etc.): wound vacs in place post session    Exercises     Assessment/Plan    PT Assessment Patient needs continued PT services  PT Problem List Decreased strength;Decreased activity tolerance;Pain;Decreased knowledge of precautions;Decreased safety awareness;Decreased balance;Decreased mobility       PT Treatment Interventions DME instruction;Gait training;Functional mobility training;Therapeutic activities;Therapeutic exercise;Neuromuscular re-education;Cognitive remediation;Patient/family education    PT Goals (Current goals can be found in the Care Plan section)  Acute Rehab PT Goals Patient Stated Goal: get back and L LE feeling better PT Goal Formulation: With patient Time For Goal Achievement: 07/15/22 Potential to Achieve Goals: Good    Frequency Min 2X/week     Co-evaluation               AM-PAC PT "6 Clicks"  Mobility  Outcome Measure Help needed turning from your back to your side while in a flat bed without using bedrails?: A Little Help needed moving from lying on your back to sitting on the side of a flat bed without using bedrails?: A Little Help needed moving to and from a bed to a chair (including a wheelchair)?: A Little Help needed standing up from a chair using your arms (e.g., wheelchair or bedside chair)?: A Little Help needed to walk in hospital room?: A Little Help needed climbing 3-5 steps with a railing? : A Little 6 Click Score: 18    End of Session Equipment Utilized During Treatment: Gait belt Activity Tolerance: Patient tolerated treatment well;Patient limited by pain Patient left: with call bell/phone within reach;in chair Nurse Communication: Mobility status PT Visit Diagnosis: Muscle weakness (generalized) (M62.81);Pain;Difficulty in walking, not elsewhere classified (R26.2) Pain - Right/Left: Left Pain - part of body: Leg    Time: [id number] PT Time Calculation (min) (ACUTE ONLY): 20 min   Charges:   PT Evaluation $PT Eval Low Complexity: 1 Low PT Treatments $Gait Training: 8-22 mins        Kreg Shropshire, DPT 07/02/2022, 1:08 PM

## 2022-07-02 NOTE — Progress Notes (Signed)
[facility] Progress Note  Patient: Marie Mills    [id number]  DOA: 06/27/2022     Date of Service: the patient was seen and examined on 07/02/2022  No chief complaint on file.  Brief hospital course: Marie Mills is a 52 y.o. female with medical history significant for GERD, hypertension, asthma, lumbar radiculopathy and remote seizure disorder, who underwent L3-L4 left hemilaminotomy and microdiscectomy on 2/28 by Dr. Izora Ribas, who presented to the emergency room with acute onset of left groin and thigh pain and numbness.  She has been having mild sinus drainage from her incision.  MRI lumbar spine reviewed The patient was given 10 mg of IV Decadron, 1 mg of IV Dilaudid, 4 mg of IV morphine sulfate and 4 mg of IV Zofran. Contact was made with Dr. Cari Caraway who did not believe there was CSF leak but recommended admission for evaluation and management of her lumbar discitis and holding IV antibiotic therapy pending IR guided biopsy. She will be admitted to a medical bed for further evaluation and management.   Assessment and Plan:  # Postop seroma and possible lumbar discitis - This is mainly at L4-L5 level.  She is status post L3-L4 left hemilaminotomy and microdiscectomy,  - IV antibiotics were held off on admission as per ID and  Neurosurgery recommendation. - Neurosurgery consulted, S/p Decadron 4 mg q6h d/cd on 3/13, continue as needed pain medications ID consulted, started meropenem and vancomycin, pharmacy consulted for trough monitoring and dosing. ESR 16, CRP <0.5--13.9 Blood culture NGTD Continue pain management WBC  838--13.9 elevated could be due to steroids 3/15 s/p washout was done by neurosurgery,  Fluid cultures growing Klebsiella pneumonia, sensitive to cephalosporin and cannula Follow ID for further recommendation of antibiotics and duration of treatment  Diarrhea, developed after antibiotics.   Started probiotics twice daily Started Imodium as  needed   Metabolic acidosis, unknown cause creatinine within normal range Bicarb 18--21--22 resolved s/p oral bicarbonate. Repeat BMP in a.m.   Hypokalemia, potassium repleted. Hypophosphatemia, Phos repleted. Monitor electrolytes and replete as needed.  Asthma, chronic - We will continue her inhalers. - We will continue Singulair.   Lumbar radiculopathy - We will continue Neurontin.   Essential hypertension -Resumed Toprol-XL 25 mg p.o. daily and losartan 50 mg p.o. daily home dose We will continue monitor BP and titrate medication accordingly   Migraine headache, continue Tylenol as needed 3/13 Imitrex 25 mg 1 dose given and as needed ordered   Morbid obesity Body mass index is 44.12 kg/m.  Interventions: Lifestyle modification advised, calorie diet and exercise advised to lose body weight       Diet: Regular diet DVT Prophylaxis: Subcutaneous Lovenox   Advance goals of care discussion: Full code  Family Communication: family was not present at bedside, at the time of interview.  The pt provided permission to discuss medical plan with the family. Opportunity was given to ask question and all questions were answered satisfactorily.   Disposition:  Pt is from Home, admitted with back pain and fluid drainage, still has Pain and on IV abx, which precludes a safe discharge. Discharge to Home, when clinically stable, needs neurosurgery and ID clearance.  Subjective: No significant events overnight, patient was sitting comfortably on the recliner.  Patient stated that pain was 8/10 and feels improvement after pain medication it is 5/10 now.  Patient started having diarrhea 3 episodes yesterday and one episode today.  Denies any abdominal pain, no nausea vomiting.  Patient denies any chest pain or  palpitation, no shortness of breath.   Physical Exam: General: NAD, lying comfortably Appear in no distress, affect appropriate Eyes: PERRLA ENT: Oral Mucosa Clear, moist   Neck: no JVD,  Cardiovascular: S1 and S2 Present, no Murmur,  Respiratory: good respiratory effort, Bilateral Air entry equal and Decreased, no Crackles, no wheezes Abdomen: Bowel Sound present, Soft and no tenderness,  Skin: no rashes Extremities: no Pedal edema, no calf tenderness Neurologic: without any new focal findings Gait not checked due to patient safety concerns  Vitals:   07/01/22 1600 07/01/22 1654 07/01/22 2323 07/02/22 0804  BP:  139/72 122/66 135/68  Pulse:  97 86 88  Resp: 16 18 15 18   Temp:  97.8 F (36.6 C) 98.2 F (36.8 C) 98.1 F (36.7 C)  TempSrc:      SpO2:  98% 96% 98%  Weight:      Height:        Intake/Output Summary (Last 24 hours) at 07/02/2022 1443 Last data filed at 07/02/2022 0950 Gross per 24 hour  Intake 360 ml  Output 80 ml  Net 280 ml   Filed Weights   06/27/22 1842 07/01/22 0649  Weight: 124 kg 124 kg    Data Reviewed: I have personally reviewed and interpreted daily labs, tele strips, imagings as discussed above. I reviewed all nursing notes, pharmacy notes, vitals, pertinent old records I have discussed plan of care as described above with RN and patient/family.  CBC: Recent Labs  Lab 06/27/22 1845 06/28/22 0904 06/30/22 0431 07/01/22 1013 07/02/22 0521  WBC 8.8 10.7* 13.9* 11.0* 11.1*  NEUTROABS 5.5  --   --   --   --   HGB 11.8* 12.1 11.4* 12.5 11.1*  HCT 36.8 38.3 35.2* 39.6 34.0*  MCV 86.8 89.3 86.9 87.2 86.1  PLT 288 248 274 290 [id number]   Basic Metabolic Panel: Recent Labs  Lab 06/27/22 1845 06/28/22 0904 06/30/22 0431 07/01/22 1013 07/02/22 0521  NA 138 136 138 139 138  K 3.6 3.9 3.6 3.6 3.0*  CL 110 107 110 105 108  CO2 20* 18* 21* 21* 22  GLUCOSE 86 140* 112* 98 97  BUN 18 14 14 12 15   CREATININE 0.77 0.65 0.60 0.72 0.69  CALCIUM 8.6* 8.7* 8.4* 8.8* 8.4*  MG  --   --  2.1 1.9 2.0  PHOS  --   --  2.5 2.3* 3.4    Studies: No results found.  Scheduled Meds:  enoxaparin (LOVENOX) injection  0.5 mg/kg  Subcutaneous Q24H   gabapentin  300 mg Oral TID   losartan  50 mg Oral Daily   metoprolol succinate  25 mg Oral Daily   pantoprazole  40 mg Oral Daily   potassium chloride  40 mEq Oral Q4H   sodium chloride flush  3 mL Intravenous Q12H   Continuous Infusions:  sodium chloride Stopped (07/01/22 1622)   sodium chloride     sodium chloride     meropenem (MERREM) IV 2 g (07/02/22 [id number])   vancomycin 1,250 mg (07/02/22 0723)   PRN Meds: acetaminophen **OR** acetaminophen, albuterol, magnesium hydroxide, menthol-cetylpyridinium **OR** phenol, methocarbamol, ondansetron **OR** ondansetron (ZOFRAN) IV, oxyCODONE, sodium chloride flush, SUMAtriptan, traZODone  Time spent: 35 minutes  Author: Val Riles. MD [facility] 07/02/2022 2:43 PM  To reach On-call, see care teams to locate the attending and reach out to them via [email or web address]. If 7PM-7AM, please contact night-coverage If you still have difficulty reaching the attending provider, please page the [facility] (Director on Call)  for [facility] on amion for assistance.

## 2022-07-02 NOTE — Evaluation (Signed)
Occupational Therapy Evaluation Patient Details Name: Marie Mills MRN: [id number] DOB: 11-Oct-1970 Today's Date: 07/02/2022   History of Present Illness Dillon Aldis is a 52 y.o. female with medical history significant for GERD, hypertension, asthma, lumbar radiculopathy and remote seizure disorder, who underwent L3-L4 left hemilaminotomy and microdiscectomy on 2/28 by Dr. Izora Ribas, who presented to the emergency room with acute onset of left groin and thigh pain and numbness.  She has been having mild sinus drainage from her incision.   Clinical Impression   Patient agreeable to OT evaluation. Pt presenting with decreased independence in self care, balance, functional mobility/transfers, and endurance. PTA pt lived with family, was independent for ADLs/IADLs, and Mod I for functional mobility using a RW. Pt endorsed 5/10 pain in back at beginning of session (pre-medicated). Pt currently functioning at [place] guard for functional mobility to the bathroom using a RW, Min guard for toilet transfer, Min A for seated LB dressing, Min guard for clothing management in standing, and set up-supervision for sinkside grooming tasks. She required Min VC to adhere to back precautions during self-care tasks. Pt will benefit from acute OT to increase overall independence in the areas of ADLs and functional mobility in order to safely discharge to next venue of care. OT recommends ongoing therapy upon discharge to maximize safety and independence with ADLs, decrease fall risk, decrease caregiver burden, and promote return to PLOF.     Recommendations for follow up therapy are one component of a multi-disciplinary discharge planning process, led by the attending physician.  Recommendations may be updated based on patient status, additional functional criteria and insurance authorization.   Follow Up Recommendations  Home health OT     Assistance Recommended at Discharge Intermittent Supervision/Assistance   Patient can return home with the following A little help with walking and/or transfers;A little help with bathing/dressing/bathroom;Assistance with cooking/housework;Assist for transportation;Help with stairs or ramp for entrance    Functional Status Assessment  Patient has had a recent decline in their functional status and demonstrates the ability to make significant improvements in function in a reasonable and predictable amount of time.  Equipment Recommendations  None recommended by OT    Recommendations for Other Services       Precautions / Restrictions Precautions Precautions: Back;Fall Precaution Booklet Issued: No Precaution Comments: wound vac Restrictions Weight Bearing Restrictions: No      Mobility Bed Mobility               General bed mobility comments: NT, pt received/left in recliner    Transfers Overall transfer level: Needs assistance Equipment used: Rolling walker (2 wheels) Transfers: Sit to/from Stand Sit to Stand: Supervision, Min guard                  Balance Overall balance assessment: Needs assistance Sitting-balance support: Feet supported Sitting balance-Leahy Scale: Normal     Standing balance support: Bilateral upper extremity supported, Single extremity supported Standing balance-Leahy Scale: Good                             ADL either performed or assessed with clinical judgement   ADL Overall ADL's : Needs assistance/impaired     Grooming: Supervision/safety;Standing;Wash/dry hands               Lower Body Dressing: Minimal assistance;Sitting/lateral leans;Cueing for safety Lower Body Dressing Details (indicate cue type and reason): to thread B feet through mesh underwear while sitting on toilet, Min  VC to avoid excessive bending forward (pt requesting to complete without AE) Toilet Transfer: Min guard;Regular Toilet;Rolling walker (2 wheels);Ambulation   Toileting- Clothing Manipulation and  Hygiene: Min guard;Sit to/from stand;Set up;Sitting/lateral lean;Cueing for safety Toileting - Clothing Manipulation Details (indicate cue type and reason): Pt completed posterior hygiene in sitting with set up A, Min VC to avoid twisting back. Min guard for safety to hike mesh underwear over B hips in standing     Functional mobility during ADLs: Min guard;Supervision/safety;Rolling walker (2 wheels) (~15 ft to the bathroom)       Vision Baseline Vision/History: 1 Wears glasses Patient Visual Report: No change from baseline       Perception     Praxis      Pertinent Vitals/Pain Pain Assessment Pain Assessment: 0-10 Pain Score: 5  Pain Location: back Pain Descriptors / Indicators: Aching, Sore Pain Intervention(s): Limited activity within patient's tolerance, Monitored during session, Premedicated before session, Repositioned     Hand Dominance     Extremity/Trunk Assessment Upper Extremity Assessment Upper Extremity Assessment: Overall WFL for tasks assessed   Lower Extremity Assessment Lower Extremity Assessment: Defer to PT evaluation   Cervical / Trunk Assessment Cervical / Trunk Assessment: Back Surgery   Communication Communication Communication: No difficulties   Cognition Arousal/Alertness: Awake/alert Behavior During Therapy: WFL for tasks assessed/performed Overall Cognitive Status: Within Functional Limits for tasks assessed                                       General Comments  wound vacs in place post session    Exercises Other Exercises Other Exercises: OT provided education re: role of OT, OT POC, post acute recs, sitting up for all meals, EOB/OOB mobility with assistance, home/fall safety, log roll technique for bed mobility, no BLT, LB dressing AE   Shoulder Instructions      Home Living Family/patient expects to be discharged to:: Private residence Living Arrangements: Children;Spouse/significant other (daughter) Available  Help at Discharge: Family;Available 24 hours/day (SO does not work; has 3 children (one daughter lives with her, other 2 children are close by and can help out as needed)) Type of Home: House Home Access: Level entry (at back of house)     Home Layout: Two level;Able to live on main level with bedroom/bathroom     Bathroom Shower/Tub: Teacher, early years/pre: Standard     Home Equipment: Tub bench;Shower Land (2 wheels)          Prior Functioning/Environment Prior Level of Function : Independent/Modified Independent;Driving             Mobility Comments: using RW recently, denies history of falls ADLs Comments: IND for ADLs/IADLs. Has not been able to work since August 2/2 back pain, driving.        OT Problem List: Decreased strength;Decreased range of motion;Decreased activity tolerance;Impaired balance (sitting and/or standing);Pain;Decreased knowledge of precautions;Obesity      OT Treatment/Interventions: Self-care/ADL training;Therapeutic exercise;Neuromuscular education;Energy conservation;DME and/or AE instruction;Manual therapy;Modalities;Balance training;Patient/family education;Visual/perceptual remediation/compensation;Cognitive remediation/compensation;Therapeutic activities;Splinting    OT Goals(Current goals can be found in the care plan section) Acute Rehab OT Goals Patient Stated Goal: reduce pain, return home OT Goal Formulation: With patient Time For Goal Achievement: 07/16/22 Potential to Achieve Goals: Good   OT Frequency: Min 2X/week    Co-evaluation              AM-PAC  OT "6 Clicks" Daily Activity     Outcome Measure Help from another person eating meals?: None Help from another person taking care of personal grooming?: None Help from another person toileting, which includes using toliet, bedpan, or urinal?: A Little Help from another person bathing (including washing, rinsing, drying)?: A Little Help from  another person to put on and taking off regular upper body clothing?: None Help from another person to put on and taking off regular lower body clothing?: A Little 6 Click Score: 21   End of Session Equipment Utilized During Treatment: Rolling walker (2 wheels) Nurse Communication: Mobility status  Activity Tolerance: Patient tolerated treatment well;Patient limited by pain Patient left: in chair;with call bell/phone within reach  OT Visit Diagnosis: Other abnormalities of gait and mobility (R26.89);Muscle weakness (generalized) (M62.81);Pain Pain - part of body:  (back)                Time: [id number] OT Time Calculation (min): 30 min Charges:  OT General Charges $OT Visit: 1 Visit OT Evaluation $OT Eval Low Complexity: 1 Low  [place] MS, OTR/L ascom [phone]  07/02/22, 11:31 AM

## 2022-07-02 NOTE — Progress Notes (Signed)
    Attending Progress Note  History: Marie Mills is s/p left L3-4 on 06/15/22 presenting with numbness and clear wound drainage and new neurologic symptoms  POD1: Patient states that she has continued pain and numbness on left. Drain put out 80 cc. Wound vac unable to maintain suction and she is having a lot of back pain.   HD3: continued leg pain and drainage from incision HD2: improved leg pain but continued numbness and back pain  Physical Exam: Vitals:   07/01/22 2323 07/02/22 0804  BP: 122/66 135/68  Pulse: 86 88  Resp: 15 18  Temp: 98.2 F (36.8 C) 98.1 F (36.7 C)  SpO2: 96% 98%    AA Ox3 CNI  Strength:5/5 throughout BLE  Incision with sutures in place. Dressing changed, HV in place  Results: CRP 13.9 on 3/13 Wound cultures: pending  Assessment/Plan:  Marie Mills 52 y.o. female who is s/p L L3/4 microdiscectomy (uncomplicated) who presents with numbness and clear wound drainage   - mobilize - pain control - DVT prophylaxis - PTOT - wound cultured 06/29/22. Results pending (GS No organisms)  Deetta Perla, MD Department of Neurosurgery

## 2022-07-02 NOTE — Plan of Care (Signed)
  Problem: Education: Goal: Knowledge of General Education information will improve Description: Including pain rating scale, medication(s)/side effects and non-pharmacologic comfort measures Outcome: Progressing   Problem: Health Behavior/Discharge Planning: Goal: Ability to manage health-related needs will improve Outcome: Progressing   Problem: Clinical Measurements: Goal: Respiratory complications will improve Outcome: Progressing Goal: Cardiovascular complication will be avoided Outcome: Progressing   Problem: Nutrition: Goal: Adequate nutrition will be maintained Outcome: Progressing   Problem: Coping: Goal: Level of anxiety will decrease Outcome: Progressing   Problem: Elimination: Goal: Will not experience complications related to bowel motility Outcome: Progressing Goal: Will not experience complications related to urinary retention Outcome: Progressing   Problem: Safety: Goal: Ability to remain free from injury will improve Outcome: Progressing   Problem: Skin Integrity: Goal: Risk for impaired skin integrity will decrease Outcome: Progressing   Problem: Education: Goal: Ability to verbalize activity precautions or restrictions will improve Outcome: Progressing Goal: Knowledge of the prescribed therapeutic regimen will improve Outcome: Progressing Goal: Understanding of discharge needs will improve Outcome: Progressing   Problem: Activity: Goal: Ability to avoid complications of mobility impairment will improve Outcome: Progressing Goal: Ability to tolerate increased activity will improve Outcome: Progressing Goal: Will remain free from falls Outcome: Progressing   Problem: Bowel/Gastric: Goal: Gastrointestinal status for postoperative course will improve Outcome: Progressing   Problem: Clinical Measurements: Goal: Ability to maintain clinical measurements within normal limits will improve Outcome: Progressing Goal: Postoperative complications  will be avoided or minimized Outcome: Progressing Goal: Diagnostic test results will improve Outcome: Progressing   Problem: Pain Management: Goal: Pain level will decrease Outcome: Progressing   Problem: Skin Integrity: Goal: Will show signs of wound healing Outcome: Progressing   Problem: Health Behavior/Discharge Planning: Goal: Identification of resources available to assist in meeting health care needs will improve Outcome: Progressing   Problem: Bladder/Genitourinary: Goal: Urinary functional status for postoperative course will improve Outcome: Progressing

## 2022-07-03 DIAGNOSIS — M4646 Discitis, unspecified, lumbar region: Secondary | ICD-10-CM | POA: Diagnosis not present

## 2022-07-03 LAB — BASIC METABOLIC PANEL
Anion gap: 7 (ref 5–15)
BUN: 17 mg/dL (ref 6–20)
CO2: 19 mmol/L — ABNORMAL LOW (ref 22–32)
Calcium: 8 mg/dL — ABNORMAL LOW (ref 8.9–10.3)
Chloride: 107 mmol/L (ref 98–111)
Creatinine, Ser: 0.62 mg/dL (ref 0.44–1.00)
GFR, Estimated: >60 mL/min (ref >60)
Glucose, Bld: 91 mg/dL (ref 70–99)
Potassium: 3.4 mmol/L — ABNORMAL LOW (ref 3.5–5.1)
Sodium: 133 mmol/L — ABNORMAL LOW (ref 135–145)

## 2022-07-03 LAB — BODY FLUID CULTURE W GRAM STAIN

## 2022-07-03 LAB — CULTURE, BLOOD (ROUTINE X 2)
Culture: NO GROWTH
Culture: NO GROWTH
Special Requests: ADEQUATE

## 2022-07-03 LAB — CBC
HCT: 36 % (ref 36.0–46.0)
Hemoglobin: 11.5 g/dL — ABNORMAL LOW (ref 12.0–15.0)
MCH: 27.8 pg (ref 26.0–34.0)
MCHC: 31.9 g/dL (ref 30.0–36.0)
MCV: 87.2 fL (ref 80.0–100.0)
Platelets: 321 10*3/uL (ref 150–400)
RBC: 4.13 MIL/uL (ref 3.87–5.11)
RDW: 14.1 % (ref 11.5–15.5)
WBC: 11.3 10*3/uL — ABNORMAL HIGH (ref 4.0–10.5)
nRBC: 0 % (ref 0.0–0.2)

## 2022-07-03 LAB — MAGNESIUM: Magnesium: 2 mg/dL (ref 1.7–2.4)

## 2022-07-03 LAB — PHOSPHORUS: Phosphorus: 2.3 mg/dL — ABNORMAL LOW (ref 2.5–4.6)

## 2022-07-03 MED ORDER — K PHOS MONO-SOD PHOS DI & MONO 155-852-130 MG PO TABS
500.0000 mg | ORAL_TABLET | Freq: Three times a day (TID) | ORAL | Status: AC
  Administered 2022-07-03 (×3): 500 mg via ORAL
  Filled 2022-07-03 (×3): qty 2

## 2022-07-03 NOTE — Plan of Care (Signed)
  Problem: Education: Goal: Knowledge of General Education information will improve Description: Including pain rating scale, medication(s)/side effects and non-pharmacologic comfort measures Outcome: Progressing   Problem: Elimination: Goal: Will not experience complications related to bowel motility Outcome: Progressing   Problem: Nutrition: Goal: Adequate nutrition will be maintained Outcome: Progressing   Problem: Activity: Goal: Risk for activity intolerance will decrease Outcome: Progressing   Problem: Pain Management: Goal: Pain level will decrease Outcome: Progressing   Problem: Skin Integrity: Goal: Will show signs of wound healing Outcome: Progressing   Problem: Bladder/Genitourinary: Goal: Urinary functional status for postoperative course will improve Outcome: Progressing

## 2022-07-03 NOTE — Progress Notes (Signed)
[facility] Progress Note  Patient: Marie Mills    [id number]  DOA: 06/27/2022     Date of Service: the patient was seen and examined on 07/03/2022  No chief complaint on file.  Brief hospital course: Marie Mills is a 52 y.o. female with medical history significant for GERD, hypertension, asthma, lumbar radiculopathy and remote seizure disorder, who underwent L3-L4 left hemilaminotomy and microdiscectomy on 2/28 by Dr. Izora Ribas, who presented to the emergency room with acute onset of left groin and thigh pain and numbness.  She has been having mild sinus drainage from her incision.  MRI lumbar spine reviewed The patient was given 10 mg of IV Decadron, 1 mg of IV Dilaudid, 4 mg of IV morphine sulfate and 4 mg of IV Zofran. Contact was made with Dr. Cari Caraway who did not believe there was CSF leak but recommended admission for evaluation and management of her lumbar discitis and holding IV antibiotic therapy pending IR guided biopsy. She will be admitted to a medical bed for further evaluation and management.   Assessment and Plan:  # Postop seroma and possible lumbar discitis - This is mainly at L4-L5 level.  She is status post L3-L4 left hemilaminotomy and microdiscectomy,  - IV antibiotics were held off on admission as per ID and  Neurosurgery recommendation. - Neurosurgery consulted, S/p Decadron 4 mg q6h d/cd on 3/13, continue as needed pain medications ID consulted, started meropenem and vancomycin, pharmacy consulted for trough monitoring and dosing. ESR 16, CRP <0.5--13.9 Blood culture NGTD Continue pain management WBC 8.8-13.9 elevated could be due to steroids 3/15 s/p washout was done by neurosurgery,  Fluid cultures growing Klebsiella pneumonia, sensitive to cephalosporin and Cipro Follow ID for further recommendation of antibiotics and duration of treatment  Diarrhea, developed after antibiotics.   Started probiotics twice daily Started Imodium as  needed   Metabolic acidosis, unknown cause creatinine within normal range Bicarb 18--21--22 resolved s/p oral bicarbonate. Repeat BMP in a.m.   Hypokalemia, potassium repleted. Hypophosphatemia, Phos repleted. Monitor electrolytes and replete as needed.  Asthma, chronic - We will continue her inhalers. - We will continue Singulair.   Lumbar radiculopathy - We will continue Neurontin.   Essential hypertension -Resumed Toprol-XL 25 mg p.o. daily and losartan 50 mg p.o. daily home dose We will continue monitor BP and titrate medication accordingly   Migraine headache, continue Tylenol as needed 3/13 Imitrex 25 mg 1 dose given and as needed ordered   Morbid obesity Body mass index is 44.12 kg/m.  Interventions: Lifestyle modification advised, calorie diet and exercise advised to lose body weight       Diet: Regular diet DVT Prophylaxis: Subcutaneous Lovenox   Advance goals of care discussion: Full code  Family Communication: family was not present at bedside, at the time of interview.  The pt provided permission to discuss medical plan with the family. Opportunity was given to ask question and all questions were answered satisfactorily.   Disposition:  Pt is from Home, admitted with back pain and fluid drainage, still has Pain and on IV abx, which precludes a safe discharge. Discharge to Home, when clinically stable, needs neurosurgery and ID clearance.  Subjective: No significant events overnight, patient was sitting comfortably on the bed, stated that she had 3 bowel movements diarrhea yesterday but no diarrhea today.  Back pain is 7/10.  Denies any chest pain or palpitation, no shortness of breath, no any other active issues.   Physical Exam: General: NAD, lying comfortably Appear in no  distress, affect appropriate Eyes: PERRLA ENT: Oral Mucosa Clear, moist  Neck: no JVD,  Cardiovascular: S1 and S2 Present, no Murmur,  Respiratory: good respiratory effort,  Bilateral Air entry equal and Decreased, no Crackles, no wheezes Abdomen: Bowel Sound present, Soft and no tenderness,  Skin: no rashes Extremities: no Pedal edema, no calf tenderness Neurologic: without any new focal findings Gait not checked due to patient safety concerns  Vitals:   07/02/22 0804 07/02/22 1533 07/02/22 2303 07/03/22 0727  BP: 135/68 (!) 111/48 (!) 146/65 (!) 145/69  Pulse: 88 (!) 108 93 91  Resp: 18 17 18 16   Temp: 98.1 F (36.7 C) 97.9 F (36.6 C) 98.4 F (36.9 C) 98.4 F (36.9 C)  TempSrc:      SpO2: 98% 100% 100% 95%  Weight:      Height:        Intake/Output Summary (Last 24 hours) at 07/03/2022 1319 Last data filed at 07/03/2022 0535 Gross per 24 hour  Intake 1788.76 ml  Output 60 ml  Net 1728.76 ml   Filed Weights   06/27/22 1842 07/01/22 0649  Weight: 124 kg 124 kg    Data Reviewed: I have personally reviewed and interpreted daily labs, tele strips, imagings as discussed above. I reviewed all nursing notes, pharmacy notes, vitals, pertinent old records I have discussed plan of care as described above with RN and patient/family.  CBC: Recent Labs  Lab 06/27/22 1845 06/28/22 0904 06/30/22 0431 07/01/22 1013 07/02/22 0521 07/03/22 0421  WBC 8.8 10.7* 13.9* 11.0* 11.1* 11.3*  NEUTROABS 5.5  --   --   --   --   --   HGB 11.8* 12.1 11.4* 12.5 11.1* 11.5*  HCT 36.8 38.3 35.2* 39.6 34.0* 36.0  MCV 86.8 89.3 86.9 87.2 86.1 87.2  PLT 288 248 274 290 292 [id number]   Basic Metabolic Panel: Recent Labs  Lab 06/28/22 0904 06/30/22 0431 07/01/22 1013 07/02/22 0521 07/03/22 0421  NA 136 138 139 138 133*  K 3.9 3.6 3.6 3.0* 3.4*  CL 107 110 105 108 107  CO2 18* 21* 21* 22 19*  GLUCOSE 140* 112* 98 97 91  BUN 14 14 12 15 17   CREATININE 0.65 0.60 0.72 0.69 0.62  CALCIUM 8.7* 8.4* 8.8* 8.4* 8.0*  MG  --  2.1 1.9 2.0 2.0  PHOS  --  2.5 2.3* 3.4 2.3*    Studies: No results found.  Scheduled Meds:  enoxaparin (LOVENOX) injection  0.5 mg/kg  Subcutaneous Q24H   gabapentin  300 mg Oral TID   losartan  50 mg Oral Daily   metoprolol succinate  25 mg Oral Daily   pantoprazole  40 mg Oral Daily   phosphorus  500 mg Oral TID   saccharomyces boulardii  250 mg Oral BID   sodium chloride flush  3 mL Intravenous Q12H   Continuous Infusions:  sodium chloride Stopped (07/01/22 1622)   sodium chloride     sodium chloride 50 mL/hr at 07/03/22 1015   meropenem (MERREM) IV 2 g (07/03/22 0516)   vancomycin 1,250 mg (07/03/22 0621)   PRN Meds: acetaminophen **OR** acetaminophen, albuterol, loperamide, magnesium hydroxide, menthol-cetylpyridinium **OR** phenol, methocarbamol, ondansetron **OR** ondansetron (ZOFRAN) IV, oxyCODONE, sodium chloride flush, traZODone  Time spent: 35 minutes  Author: Val Riles. MD [facility] 07/03/2022 1:19 PM  To reach On-call, see care teams to locate the attending and reach out to them via [email or web address]. If 7PM-7AM, please contact night-coverage If you still have difficulty reaching the attending  provider, please page the [facility] (Director on Call) for [facility] on amion for assistance.

## 2022-07-03 NOTE — Progress Notes (Signed)
    Attending Progress Note  History: Marie Mills is s/p left L3-4 on 06/15/22 presenting with numbness and clear wound drainage and new neurologic symptoms  POD2: Patient states that she has continued pain and numbness on left. Drain put out 60 cc. She did stand yesterday. Cultures now showing E Coli and Klebsiella   HD3: continued leg pain and drainage from incision HD2: improved leg pain but continued numbness and back pain  Physical Exam: Vitals:   07/03/22 0727 07/03/22 1535  BP: (!) 145/69 (!) 143/87  Pulse: 91 89  Resp: 16 16  Temp: 98.4 F (36.9 C) 98.3 F (36.8 C)  SpO2: 95% 98%    AA Ox3 CNI  Strength:5/5 throughout BLE  Incision with sutures in place. Dressing changed, HV in place  Results: CRP 13.9 on 3/13 Wound cultures: E Coli and Klebsiella  Assessment/Plan:  Marie Mills 52 y.o. female who is s/p L L3/4 microdiscectomy (uncomplicated) who presents with numbness and now s/p wound infection washout  - mobilize - pain control - DVT prophylaxis - PTOT - wound cultured 06/29/22. Antibiotics per ID  Deetta Perla, MD Department of Neurosurgery

## 2022-07-03 NOTE — Progress Notes (Signed)
PT Cancellation Note  Patient Details Name: Marie Mills MRN: [id number] DOB: 09/10/1970   Cancelled Treatment:    Reason Eval/Treat Not Completed: Pain limiting ability to participate;Other (comment) (Attempted 3 times to see patient and each time - she declined  due to increased pain. RN aware of pain.)   Lewis Moccasin 07/03/2022, 2:08 PM

## 2022-07-04 ENCOUNTER — Inpatient Hospital Stay: Payer: Medicaid Other

## 2022-07-04 DIAGNOSIS — G062 Extradural and subdural abscess, unspecified: Secondary | ICD-10-CM

## 2022-07-04 DIAGNOSIS — M4646 Discitis, unspecified, lumbar region: Secondary | ICD-10-CM | POA: Diagnosis not present

## 2022-07-04 DIAGNOSIS — T8149XA Infection following a procedure, other surgical site, initial encounter: Secondary | ICD-10-CM | POA: Diagnosis not present

## 2022-07-04 DIAGNOSIS — M4626 Osteomyelitis of vertebra, lumbar region: Secondary | ICD-10-CM | POA: Diagnosis not present

## 2022-07-04 LAB — CBC
HCT: 34.6 % — ABNORMAL LOW (ref 36.0–46.0)
Hemoglobin: 11.2 g/dL — ABNORMAL LOW (ref 12.0–15.0)
MCH: 28 pg (ref 26.0–34.0)
MCHC: 32.4 g/dL (ref 30.0–36.0)
MCV: 86.5 fL (ref 80.0–100.0)
Platelets: 288 10*3/uL (ref 150–400)
RBC: 4 MIL/uL (ref 3.87–5.11)
RDW: 13.9 % (ref 11.5–15.5)
WBC: 8.9 10*3/uL (ref 4.0–10.5)
nRBC: 0 % (ref 0.0–0.2)

## 2022-07-04 LAB — CULTURE, BLOOD (ROUTINE X 2)
Culture: NO GROWTH
Culture: NO GROWTH
Special Requests: ADEQUATE

## 2022-07-04 LAB — AEROBIC CULTURE W GRAM STAIN (SUPERFICIAL SPECIMEN)

## 2022-07-04 LAB — BODY FLUID CULTURE W GRAM STAIN

## 2022-07-04 LAB — BASIC METABOLIC PANEL
Anion gap: 9 (ref 5–15)
BUN: 11 mg/dL (ref 6–20)
CO2: 22 mmol/L (ref 22–32)
Calcium: 8 mg/dL — ABNORMAL LOW (ref 8.9–10.3)
Chloride: 104 mmol/L (ref 98–111)
Creatinine, Ser: 0.58 mg/dL (ref 0.44–1.00)
GFR, Estimated: >60 mL/min (ref >60)
Glucose, Bld: 90 mg/dL (ref 70–99)
Potassium: 3.4 mmol/L — ABNORMAL LOW (ref 3.5–5.1)
Sodium: 135 mmol/L (ref 135–145)

## 2022-07-04 LAB — MAGNESIUM: Magnesium: 2.1 mg/dL (ref 1.7–2.4)

## 2022-07-04 LAB — PHOSPHORUS: Phosphorus: 2.9 mg/dL (ref 2.5–4.6)

## 2022-07-04 MED ORDER — SUMATRIPTAN SUCCINATE 50 MG PO TABS
25.0000 mg | ORAL_TABLET | ORAL | Status: DC | PRN
  Administered 2022-07-04: 25 mg via ORAL

## 2022-07-04 MED ORDER — GABAPENTIN 400 MG PO CAPS
400.0000 mg | ORAL_CAPSULE | Freq: Three times a day (TID) | ORAL | Status: DC
  Administered 2022-07-04: 400 mg via ORAL
  Filled 2022-07-04: qty 1

## 2022-07-04 MED ORDER — POTASSIUM CHLORIDE CRYS ER 20 MEQ PO TBCR
40.0000 meq | EXTENDED_RELEASE_TABLET | Freq: Once | ORAL | Status: AC
  Administered 2022-07-04: 40 meq via ORAL
  Filled 2022-07-04: qty 2

## 2022-07-04 MED ORDER — HYDROMORPHONE HCL 1 MG/ML IJ SOLN
0.5000 mg | INTRAMUSCULAR | Status: DC | PRN
  Administered 2022-07-04 (×2): 0.5 mg via INTRAVENOUS
  Filled 2022-07-04 (×2): qty 1

## 2022-07-04 MED ORDER — GADOBUTROL 1 MMOL/ML IV SOLN
10.0000 mL | Freq: Once | INTRAVENOUS | Status: AC | PRN
  Administered 2022-07-04: 10 mL via INTRAVENOUS

## 2022-07-04 MED ORDER — GABAPENTIN 300 MG PO CAPS
600.0000 mg | ORAL_CAPSULE | Freq: Three times a day (TID) | ORAL | Status: DC
  Administered 2022-07-04 – 2022-07-07 (×8): 600 mg via ORAL
  Filled 2022-07-04 (×8): qty 2

## 2022-07-04 NOTE — Progress Notes (Signed)
   Date of Admission:  06/27/2022    ID: Marie Mills is a 52 y.o. 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   H/O microdiscectomy   Postoperative wound infection   Infection of lumbar spine (HCC)    Subjective: Pt c/o deterioration in the past 2 days More pain down the left buttock and leg and numbness Cannot walk Neurosurgeon at bed side   Medications:   enoxaparin (LOVENOX) injection  0.5 mg/kg Subcutaneous Q24H   gabapentin  400 mg Oral TID   losartan  50 mg Oral Daily   metoprolol succinate  25 mg Oral Daily   pantoprazole  40 mg Oral Daily   saccharomyces boulardii  250 mg Oral BID   sodium chloride flush  3 mL Intravenous Q12H    Objective: Vital signs in last 24 hours: Patient Vitals for the past 24 hrs:  BP Temp Pulse Resp SpO2  07/04/22 0740 (!) 144/80 97.9 F (36.6 C) 99 17 98 %  07/04/22 0035 (!) 113/51 97.7 F (36.5 C) 90 18 93 %  07/03/22 1535 (!) 143/87 98.3 F (36.8 C) 89 16 98 %      PHYSICAL EXAM:  General: awake and alert,  Lungs: Clear to auscultation bilaterally. No Wheezing or Rhonchi. No rales. Heart: Tachycardia Abdomen: Soft, non-tender,not distended. Bowel sounds normal. No masses Extremities: atraumatic, no cyanosis. No edema. No clubbing Skin: surgical site covered with dressing Lymph: Cervical, supraclavicular normal. Neurologic: did not assess in detail  Lab Results Recent Labs    07/03/22 0421 07/04/22 0430  WBC 11.3* 8.9  HGB 11.5* 11.2*  HCT 36.0 34.6*  NA 133* 135  K 3.4* 3.4*  CL 107 104  CO2 19* 22  BUN 17 11  CREATININE 0.62 0.58   Microbiology: BC ng so far WC- superficial  klebsiella Pseudomonas And from epidural fluid- ESBl ecoli and klebsiella  Imaging L3-L4 left hemilaminotomy and microdiscectomy,  fluid collection extending from the skin surface to the  posterior and anterior aspect of the thecal sac at L3-L4, causing moderate thecal sac narrowing.  L2-L3, L3-L4, and L4-L5, as well as in the bilateral L3-L4 facets, concerning for infection Contrast enhancement at the posterior aspect of the L4-L5 disc, concerning for discitis. epidural fluid collection along the dorsal aspect of the thecal sac from T12-L1 through L3-L4, possibly a CSF leak, which causes additional moderate thecal sac narrowing at L1-L2 and L2-L3. Assessment/Plan: Pt is 2 weeks post L3-L4 hemilaminotomy and microdiscectomy for lumbar radiculopathy. Presenting with severe pain and numbness over left buttock, groin and labia.  Epidural abscess T12-L1 L3-L4, ?discitis L4-L5, paraspinal abscess, and superficial infection S/p surgery and culture multiple gram neg organisms including pseudomonas, Klebsiella, ESBL ecoli Currently on meropenem and vanco Will Dc vanco. Will need 6 weeks minimum of IV meropenem- following which she would get cipro+ bactrim  Another MRI is being ordered because of rebound symptoms ?  PCN allergy as a child- was told by her mom not to take it We may do cephalosporin later after we have all the microbiological information  Discussed the management with patient,  and care team

## 2022-07-04 NOTE — Progress Notes (Signed)
Spoke to [name].  Her MRI is reassuring and shows improved fluid collection.  Her weakness earlier was more in her iliopsoas and quads than further distal.  The thoracolumbar fluid collection seen on the prior MRI is much improved.  This is fluid around the surgical site as expected.  - Continue antibiotics - No surgery for now - Will alter pain medications to try to help with pain control - OK to eat

## 2022-07-04 NOTE — Progress Notes (Signed)
[facility] Progress Note  Patient: Marie Mills    [id number]  DOA: 06/27/2022     Date of Service: the patient was seen and examined on 07/04/2022  No chief complaint on file.  Brief hospital course: Marie Mills is a 52 y.o. female with medical history significant for GERD, hypertension, asthma, lumbar radiculopathy and remote seizure disorder, who underwent L3-L4 left hemilaminotomy and microdiscectomy on 2/28 by Dr. Izora Ribas, who presented to the emergency room with acute onset of left groin and thigh pain and numbness.  She has been having mild sinus drainage from her incision.  MRI lumbar spine reviewed The patient was given 10 mg of IV Decadron, 1 mg of IV Dilaudid, 4 mg of IV morphine sulfate and 4 mg of IV Zofran. Contact was made with Dr. Cari Caraway who did not believe there was CSF leak but recommended admission for evaluation and management of her lumbar discitis and holding IV antibiotic therapy pending IR guided biopsy. She will be admitted to a medical bed for further evaluation and management.   Assessment and Plan:  # Postop seroma and possible lumbar discitis - This is mainly at L4-L5 level.  She is status post L3-L4 left hemilaminotomy and microdiscectomy,  - IV antibiotics were held off on admission as per ID and  Neurosurgery recommendation. - Neurosurgery consulted, S/p Decadron 4 mg q6h d/cd on 3/13, continue as needed pain medications ID consulted, started meropenem and vancomycin, pharmacy consulted for trough monitoring and dosing. ESR 16, CRP <0.5--13.9 Blood culture NGTD Continue pain management WBC 8.8-13.9 elevated could be due to steroids 3/15 s/p washout was done by neurosurgery,  Fluid cultures growing Klebsiella pneumonia, and Pseudomonas which are pansensitive and E. coli seems multidrug-resistant  Follow ID for further recommendation of antibiotics and duration of treatment  Diarrhea, developed after antibiotics.   Started probiotics  twice daily Started Imodium as needed   Metabolic acidosis, unknown cause creatinine within normal range Bicarb 18--21--22 resolved s/p oral bicarbonate. Repeat BMP in a.m.   Hypokalemia, potassium repleted. Hypophosphatemia, Phos repleted. Monitor electrolytes and replete as needed.  Asthma, chronic - We will continue her inhalers. - We will continue Singulair.   Lumbar radiculopathy - We will continue Neurontin.   Essential hypertension -Resumed Toprol-XL 25 mg p.o. daily and losartan 50 mg p.o. daily home dose We will continue monitor BP and titrate medication accordingly   Migraine headache, continue Tylenol as needed 3/13 Imitrex 25 mg 1 dose given and as needed ordered   Morbid obesity Body mass index is 44.12 kg/m.  Interventions: Lifestyle modification advised, calorie diet and exercise advised to lose body weight      Diet: Regular diet DVT Prophylaxis: Subcutaneous Lovenox   Advance goals of care discussion: Full code  Family Communication: family was not present at bedside, at the time of interview.  The pt provided permission to discuss medical plan with the family. Opportunity was given to ask question and all questions were answered satisfactorily.   Disposition:  Pt is from Home, admitted with back pain and fluid drainage, still has Pain and on IV abx, which precludes a safe discharge. Discharge to SNF, when clinically stable, needs neurosurgery and ID clearance.  Subjective: No significant events overnight, patient is having significant pain back 10/10 radiating to left leg and also feeling leg numbness.  Diarrhea has been resolved.  Pain got worse after working with physical therapy. Texted neurosurgery again for evaluation.   Physical Exam: General: NAD, lying comfortably Appear in no distress,  affect appropriate Eyes: PERRLA ENT: Oral Mucosa Clear, moist  Neck: no JVD,  Cardiovascular: S1 and S2 Present, no Murmur,  Respiratory: good  respiratory effort, Bilateral Air entry equal and Decreased, no Crackles, no wheezes Abdomen: Bowel Sound present, Soft and no tenderness,  Skin: no rashes Extremities: no Pedal edema, no calf tenderness Neurologic: without any new focal findings Gait not checked due to patient safety concerns  Vitals:   07/03/22 0727 07/03/22 1535 07/04/22 0035 07/04/22 0740  BP: (!) 145/69 (!) 143/87 (!) 113/51 (!) 144/80  Pulse: 91 89 90 99  Resp: 16 16 18 17   Temp: 98.4 F (36.9 C) 98.3 F (36.8 C) 97.7 F (36.5 C) 97.9 F (36.6 C)  TempSrc:      SpO2: 95% 98% 93% 98%  Weight:      Height:        Intake/Output Summary (Last 24 hours) at 07/04/2022 1409 Last data filed at 07/04/2022 1000 Gross per 24 hour  Intake 2929.44 ml  Output 10 ml  Net 2919.44 ml   Filed Weights   06/27/22 1842 07/01/22 0649  Weight: 124 kg 124 kg    Data Reviewed: I have personally reviewed and interpreted daily labs, tele strips, imagings as discussed above. I reviewed all nursing notes, pharmacy notes, vitals, pertinent old records I have discussed plan of care as described above with RN and patient/family.  CBC: Recent Labs  Lab 06/27/22 1845 06/28/22 0904 06/30/22 0431 07/01/22 1013 07/02/22 0521 07/03/22 0421 07/04/22 0430  WBC 8.8   < > 13.9* 11.0* 11.1* 11.3* 8.9  NEUTROABS 5.5  --   --   --   --   --   --   HGB 11.8*   < > 11.4* 12.5 11.1* 11.5* 11.2*  HCT 36.8   < > 35.2* 39.6 34.0* 36.0 34.6*  MCV 86.8   < > 86.9 87.2 86.1 87.2 86.5  PLT 288   < > 274 290 292 321 288   < > = values in this interval not displayed.   Basic Metabolic Panel: Recent Labs  Lab 06/30/22 0431 07/01/22 1013 07/02/22 0521 07/03/22 0421 07/04/22 0430  NA 138 139 138 133* 135  K 3.6 3.6 3.0* 3.4* 3.4*  CL 110 105 108 107 104  CO2 21* 21* 22 19* 22  GLUCOSE 112* 98 97 91 90  BUN 14 12 15 17 11   CREATININE 0.60 0.72 0.69 0.62 0.58  CALCIUM 8.4* 8.8* 8.4* 8.0* 8.0*  MG 2.1 1.9 2.0 2.0 2.1  PHOS 2.5 2.3*  3.4 2.3* 2.9    Studies: No results found.  Scheduled Meds:  enoxaparin (LOVENOX) injection  0.5 mg/kg Subcutaneous Q24H   gabapentin  400 mg Oral TID   losartan  50 mg Oral Daily   metoprolol succinate  25 mg Oral Daily   pantoprazole  40 mg Oral Daily   saccharomyces boulardii  250 mg Oral BID   sodium chloride flush  3 mL Intravenous Q12H   Continuous Infusions:  sodium chloride Stopped (07/01/22 1622)   sodium chloride     sodium chloride 50 mL/hr at 07/04/22 1000   meropenem (MERREM) IV 2 g (07/04/22 1402)   PRN Meds: acetaminophen **OR** acetaminophen, albuterol, HYDROmorphone (DILAUDID) injection, loperamide, magnesium hydroxide, menthol-cetylpyridinium **OR** phenol, methocarbamol, ondansetron **OR** ondansetron (ZOFRAN) IV, oxyCODONE, sodium chloride flush, SUMAtriptan, traZODone  Time spent: 35 minutes  Author: Val Riles. MD [facility] 07/04/2022 2:09 PM  To reach On-call, see care teams to locate the attending  and reach out to them via [email or web address]. If 7PM-7AM, please contact night-coverage If you still have difficulty reaching the attending provider, please page the [facility] (Director on Call) for [facility] on amion for assistance.

## 2022-07-04 NOTE — Progress Notes (Signed)
Physical Therapy Treatment Patient Details Name: Marie Mills MRN: [id number] DOB: [birth year] Today's Date: 07/04/2022   History of Present Illness Marie Mills is a 52 y.o. female with medical history significant for GERD, hypertension, asthma, lumbar radiculopathy and remote seizure disorder, who underwent L3-L4 left hemilaminotomy and microdiscectomy on 2/28 by Dr. Izora Ribas, who presented to the emergency room with acute onset of left groin and thigh pain and numbness.  She has been having mild drainage from her incision. MD assessment includes: postop seroma and possible lumbar discitis, wound infection s/p washout, diarrhea, metabolic acidosis, and hypokalemia.    PT Comments    Pt was pleasant and motivated to participate during the session and put forth good effort throughout. Pt required extensive time, effort, and eventually +2 assist to scoot towards the front of her chair.  Pt required +2 Mod A and cues for sequencing to come to standing but displayed good standing balance once up.  Pt only able to amb a max of around 10 feet with very effortful, antalgic step-to pattern with occasional shuffling of the RLE secondary to difficulty with WB through the LLE.  Pt is at a high risk for further functional decline and would not be appropriate to return to her prior living situation due to the extent of the assistance required for mobility.  Pt will benefit from PT services in a SNF setting upon discharge to safely address deficits listed in patient problem list for decreased caregiver assistance and eventual return to PLOF.      Recommendations for follow up therapy are one component of a multi-disciplinary discharge planning process, led by the attending physician.  Recommendations may be updated based on patient status, additional functional criteria and insurance authorization.  Follow Up Recommendations  Skilled nursing-short term rehab (<3 hours/day) Can patient physically be  transported by private vehicle: No   Assistance Recommended at Discharge Frequent or constant Supervision/Assistance  Patient can return home with the following A lot of help with walking and/or transfers;A lot of help with bathing/dressing/bathroom;Assistance with cooking/housework;Direct supervision/assist for medications management;Assist for transportation   Equipment Recommendations  Other (comment) (TBD at next venue of care)    Recommendations for Other Services       Precautions / Restrictions Precautions Precautions: Back;Fall Precaution Comments: wound vac Restrictions Weight Bearing Restrictions: No Other Position/Activity Restrictions: No brace needed     Mobility  Bed Mobility               General bed mobility comments: NT, pt received/left in recliner    Transfers Overall transfer level: Needs assistance Equipment used: Rolling walker (2 wheels) Transfers: Sit to/from Stand Sit to Stand: Mod assist, +2 physical assistance           General transfer comment: Mod verbal cues for sequencing    Ambulation/Gait Ambulation/Gait assistance: Min guard Gait Distance (Feet): 10 Feet Assistive device: Rolling walker (2 wheels) Gait Pattern/deviations: Step-through pattern, Decreased step length - right, Decreased stance time - left, Antalgic Gait velocity: decreased     General Gait Details: Pt only able to amb a max of around 10 feet with very effortful, antalgic step-to pattern with occasional shuffling of the RLE secondary to difficulty with WB through the LLE   Stairs             Wheelchair Mobility    Modified Rankin (Stroke Patients Only)       Balance Overall balance assessment: Needs assistance Sitting-balance support: Feet supported Sitting balance-Leahy Scale: Good  Standing balance support: Bilateral upper extremity supported, Reliant on assistive device for balance, During functional activity Standing balance-Leahy  Scale: Good                              Cognition Arousal/Alertness: Awake/alert Behavior During Therapy: WFL for tasks assessed/performed Overall Cognitive Status: Within Functional Limits for tasks assessed                                          Exercises Total Joint Exercises Long Arc Quad: Strengthening, Both, 10 reps Knee Flexion: Strengthening, Both, 10 reps Other Exercises Other Exercises: General back precaution sequencing education during functional tasks    General Comments        Pertinent Vitals/Pain Pain Assessment Pain Assessment: 0-10 Pain Score: 7  Pain Location: back Pain Descriptors / Indicators: Aching, Sore Pain Intervention(s): Repositioned, Premedicated before session, Monitored during session    Home Living                          Prior Function            PT Goals (current goals can now be found in the care plan section) Progress towards PT goals: Not progressing toward goals - comment (limited by pain)    Frequency    Min 2X/week      PT Plan Discharge plan needs to be updated    Co-evaluation              AM-PAC PT "6 Clicks" Mobility   Outcome Measure  Help needed turning from your back to your side while in a flat bed without using bedrails?: A Lot Help needed moving from lying on your back to sitting on the side of a flat bed without using bedrails?: A Lot Help needed moving to and from a bed to a chair (including a wheelchair)?: A Lot Help needed standing up from a chair using your arms (e.g., wheelchair or bedside chair)?: A Lot Help needed to walk in hospital room?: A Lot Help needed climbing 3-5 steps with a railing? : Total 6 Click Score: 11    End of Session Equipment Utilized During Treatment: Gait belt Activity Tolerance: Patient limited by pain Patient left: in chair;with call bell/phone within reach Nurse Communication: Mobility status;Other (comment) (Pt  requesting no SCDs) PT Visit Diagnosis: Muscle weakness (generalized) (M62.81);Pain;Difficulty in walking, not elsewhere classified (R26.2) Pain - Right/Left: Left Pain - part of body: Leg     Time: [id number] PT Time Calculation (min) (ACUTE ONLY): 25 min  Charges:  $Gait Training: 8-22 mins $Therapeutic Activity: 8-22 mins                     D. Scott Symphony Demuro PT, DPT 07/04/22, 10:08 AM

## 2022-07-04 NOTE — Plan of Care (Signed)

## 2022-07-04 NOTE — Progress Notes (Addendum)
    Attending Progress Note  History: Marie Mills is s/p left L3-4 on 06/15/22 presenting with numbness and clear wound drainage and new neurologic symptoms  POD3: continued leg pain. POD2: Patient states that she has continued pain and numbness on left. Drain put out 60 cc. She did stand yesterday. Cultures now showing E Coli and Klebsiella   HD3: continued leg pain and drainage from incision HD2: improved leg pain but continued numbness and back pain  Physical Exam: Vitals:   07/04/22 0035 07/04/22 0740  BP: (!) 113/51 (!) 144/80  Pulse: 90 99  Resp: 18 17  Temp: 97.7 F (36.5 C) 97.9 F (36.6 C)  SpO2: 93% 98%    AA Ox3 CNI  Strength:5/5 throughout BLE  Incision with sutures in place c/d/I.  HV output 10  Results: CRP 13.9 on 3/13 Wound cultures: E Coli and Klebsiella  Assessment/Plan:  Marie Mills 52 y.o. female who is s/p L L3/4 microdiscectomy (uncomplicated) who presents with numbness and now s/p wound infection washout  - mobilize - pain control - DVT prophylaxis - PTOT -  Antibiotics per ID  Cooper Render PA-C Department of Neurosurgery   Addendum 1445 PM Called to see patient. She had a very difficult time with PT. She reports pain down her left leg with weight bearing.  On my exam, she has 4-/5 L IP, Q, HS with 4+/5 DF and PF. She continues to have L leg numbness.  She has pain with exam, so may have some limitation secondary to pain.  Based on this change, I have recommended repeating her MRI scan. I made her NPO. We will reevaluate after her MRI.

## 2022-07-04 NOTE — TOC Progression Note (Signed)
Transition of Care [facility]) - Progression Note    Patient Details  Name: Marie Mills MRN: [id number] Date of Birth: 1970-10-06  Transition of Care [facility]) CM/SW [place], RN Phone Number: 07/04/2022, 3:21 PM  Clinical Narrative:     Spoke with the patient, I explained with Medicaid she owuld have to agree to go to [facility] SNF for 30 day increments She would also have to sign over any income check that she has for that 30 days, she stated that would not be a good option, I explained getting [facility] set up with the Medicaid is difficult, she stated that her daughter could take her to Outpatient PT She stated that the Doctor is talking about doing another surgery She has a rolling walker and a shower seat at home TOC to continue to follow for DC planning and needs Her husband will transport home at DC  Expected Discharge Plan: OP Rehab Barriers to Discharge: Inadequate or no insurance  Expected Discharge Plan and Services   Discharge Planning Services: CM Consult   Living arrangements for the past 2 months: Single Family Home                 DME Arranged: N/A DME Agency: NA       HH Arranged: NA           Social Determinants of Health (SDOH) Interventions SDOH Screenings   Food Insecurity: No Food Insecurity (06/28/2022)  Housing: Low Risk  (06/28/2022)  Transportation Needs: No Transportation Needs (06/28/2022)  Utilities: Not At Risk (06/28/2022)  Alcohol Screen: Low Risk  (03/23/2022)  Depression (PHQ2-9): Low Risk  (03/05/2021)  Recent Concern: Depression (PHQ2-9) - Medium Risk (02/03/2021)  Financial Resource Strain: High Risk (03/23/2022)  Physical Activity: Inactive (03/23/2022)  Social Connections: Moderately Isolated (03/23/2022)  Stress: Stress Concern Present (03/23/2022)  Tobacco Use: Low Risk  (07/02/2022)    Readmission Risk Interventions     No data to display

## 2022-07-05 DIAGNOSIS — M4646 Discitis, unspecified, lumbar region: Secondary | ICD-10-CM | POA: Diagnosis not present

## 2022-07-05 DIAGNOSIS — G062 Extradural and subdural abscess, unspecified: Secondary | ICD-10-CM | POA: Diagnosis not present

## 2022-07-05 DIAGNOSIS — M5416 Radiculopathy, lumbar region: Secondary | ICD-10-CM | POA: Diagnosis not present

## 2022-07-05 DIAGNOSIS — M4626 Osteomyelitis of vertebra, lumbar region: Secondary | ICD-10-CM | POA: Diagnosis not present

## 2022-07-05 LAB — CBC
HCT: 33.6 % — ABNORMAL LOW (ref 36.0–46.0)
Hemoglobin: 10.9 g/dL — ABNORMAL LOW (ref 12.0–15.0)
MCH: 28.2 pg (ref 26.0–34.0)
MCHC: 32.4 g/dL (ref 30.0–36.0)
MCV: 86.8 fL (ref 80.0–100.0)
Platelets: 321 10*3/uL (ref 150–400)
RBC: 3.87 MIL/uL (ref 3.87–5.11)
RDW: 14.2 % (ref 11.5–15.5)
WBC: 9.6 10*3/uL (ref 4.0–10.5)
nRBC: 0 % (ref 0.0–0.2)

## 2022-07-05 LAB — BASIC METABOLIC PANEL
Anion gap: 9 (ref 5–15)
BUN: 12 mg/dL (ref 6–20)
CO2: 23 mmol/L (ref 22–32)
Calcium: 8.4 mg/dL — ABNORMAL LOW (ref 8.9–10.3)
Chloride: 102 mmol/L (ref 98–111)
Creatinine, Ser: 0.61 mg/dL (ref 0.44–1.00)
GFR, Estimated: >60 mL/min (ref >60)
Glucose, Bld: 99 mg/dL (ref 70–99)
Potassium: 3.6 mmol/L (ref 3.5–5.1)
Sodium: 134 mmol/L — ABNORMAL LOW (ref 135–145)

## 2022-07-05 LAB — MAGNESIUM: Magnesium: 2.4 mg/dL (ref 1.7–2.4)

## 2022-07-05 LAB — PHOSPHORUS: Phosphorus: 2.2 mg/dL — ABNORMAL LOW (ref 2.5–4.6)

## 2022-07-05 MED ORDER — ACETAMINOPHEN 325 MG PO TABS
650.0000 mg | ORAL_TABLET | Freq: Four times a day (QID) | ORAL | Status: DC
  Administered 2022-07-05 – 2022-07-07 (×10): 650 mg via ORAL
  Filled 2022-07-05 (×10): qty 2

## 2022-07-05 MED ORDER — K PHOS MONO-SOD PHOS DI & MONO 155-852-130 MG PO TABS
500.0000 mg | ORAL_TABLET | Freq: Three times a day (TID) | ORAL | Status: AC
  Administered 2022-07-05 (×3): 500 mg via ORAL
  Filled 2022-07-05 (×3): qty 2

## 2022-07-05 MED ORDER — ACETAMINOPHEN 650 MG RE SUPP: 650.0000 mg | Freq: Four times a day (QID) | RECTAL | Status: DC

## 2022-07-05 MED ORDER — METHOCARBAMOL 500 MG PO TABS
750.0000 mg | ORAL_TABLET | Freq: Three times a day (TID) | ORAL | Status: DC
  Administered 2022-07-05 – 2022-07-07 (×7): 750 mg via ORAL
  Filled 2022-07-05: qty 2
  Filled 2022-07-05: qty 1.5
  Filled 2022-07-05 (×5): qty 2

## 2022-07-05 MED ORDER — LOPERAMIDE HCL 2 MG PO CAPS
2.0000 mg | ORAL_CAPSULE | Freq: Three times a day (TID) | ORAL | Status: DC | PRN
  Administered 2022-07-05: 2 mg via ORAL
  Filled 2022-07-05: qty 1

## 2022-07-05 NOTE — Progress Notes (Signed)
Physical Therapy Treatment Patient Details Name: Marie Mills MRN: [id number] DOB: 01-Jul-1970 Today's Date: 07/05/2022   History of Present Illness Marie Mills is a 52 y.o. female with medical history significant for GERD, hypertension, asthma, lumbar radiculopathy and remote seizure disorder, who underwent L3-L4 left hemilaminotomy and microdiscectomy on 2/28 by Dr. Izora Ribas, who presented to the emergency room with acute onset of left groin and thigh pain and numbness.  She has been having mild drainage from her incision. MD assessment includes: postop seroma and possible lumbar discitis, wound infection s/p washout, diarrhea, metabolic acidosis, and hypokalemia.    PT Comments    Pt was pleasant and motivated to participate during the session and put forth good effort throughout. Pt presented with a significant improvement in functional mobility this session compared to prior session.  Pt required no physical assistance during the session and demonstrated good control and stability throughout. Pt was able to amb 60 feet with no LOB or buckling without the use of an AD.  Pt reported no adverse symptoms during the session other than 7/10 back pain that did not worsen with activity.  Pt will benefit from HHPT upon discharge to safely address deficits listed in patient problem list for decreased caregiver assistance and eventual return to PLOF.     Recommendations for follow up therapy are one component of a multi-disciplinary discharge planning process, led by the attending physician.  Recommendations may be updated based on patient status, additional functional criteria and insurance authorization.  Follow Up Recommendations  Home health PT Can patient physically be transported by private vehicle: Yes   Assistance Recommended at Discharge Frequent or constant Supervision/Assistance  Patient can return home with the following A lot of help with walking and/or transfers;A lot of help with  bathing/dressing/bathroom;Assistance with cooking/housework;Direct supervision/assist for medications management;Assist for transportation   Equipment Recommendations  None recommended by PT    Recommendations for Other Services       Precautions / Restrictions Precautions Precautions: Back;Fall Restrictions Weight Bearing Restrictions: No Other Position/Activity Restrictions: No brace needed     Mobility  Bed Mobility Overal bed mobility: Needs Assistance Bed Mobility: Rolling, Sidelying to Sit, Sit to Sidelying Rolling: Supervision Sidelying to sit: Supervision     Sit to sidelying: Supervision General bed mobility comments: SBA for min cuing for log roll sequencing    Transfers Overall transfer level: Needs assistance Equipment used: None Transfers: Sit to/from Stand Sit to Stand: Supervision           General transfer comment: Very good control and stability this session with only minimal extra time and effort to come to standing    Ambulation/Gait Ambulation/Gait assistance: Supervision Gait Distance (Feet): 60 Feet Assistive device: None Gait Pattern/deviations: Step-through pattern Gait velocity: decreased     General Gait Details: Pt self-selected no AD with gait this session and was able to amb 60 feet with good stability including while making sharp turns and side-stepping   Stairs             Wheelchair Mobility    Modified Rankin (Stroke Patients Only)       Balance Overall balance assessment: Needs assistance   Sitting balance-Leahy Scale: Normal     Standing balance support: During functional activity, No upper extremity supported Standing balance-Leahy Scale: Good                              Cognition Arousal/Alertness: Awake/alert Behavior During  Therapy: WFL for tasks assessed/performed Overall Cognitive Status: Within Functional Limits for tasks assessed                                           Exercises Total Joint Exercises Long Arc Quad: Strengthening, Both, 15 reps Knee Flexion: Strengthening, Both, 15 reps Other Exercises Other Exercises: HEP review for BLE APs, QS, GS, and LAQs    General Comments        Pertinent Vitals/Pain Pain Assessment Pain Assessment: 0-10 Pain Score: 7  Pain Location: back Pain Descriptors / Indicators: Aching, Sore Pain Intervention(s): Repositioned, Premedicated before session, Monitored during session    Home Living                          Prior Function            PT Goals (current goals can now be found in the care plan section) Progress towards PT goals: Progressing toward goals    Frequency    7X/week      PT Plan Frequency needs to be updated;Discharge plan needs to be updated    Co-evaluation              AM-PAC PT "6 Clicks" Mobility   Outcome Measure  Help needed turning from your back to your side while in a flat bed without using bedrails?: None Help needed moving from lying on your back to sitting on the side of a flat bed without using bedrails?: None Help needed moving to and from a bed to a chair (including a wheelchair)?: A Little Help needed standing up from a chair using your arms (e.g., wheelchair or bedside chair)?: A Little Help needed to walk in hospital room?: A Little Help needed climbing 3-5 steps with a railing? : A Little 6 Click Score: 20    End of Session Equipment Utilized During Treatment: Gait belt Activity Tolerance: Patient limited by pain Patient left: with call bell/phone within reach;in bed Nurse Communication: Mobility status PT Visit Diagnosis: Muscle weakness (generalized) (M62.81);Pain;Difficulty in walking, not elsewhere classified (R26.2) Pain - part of body:  (low back)     Time: [id number] PT Time Calculation (min) (ACUTE ONLY): 16 min  Charges:  $Gait Training: 8-22 mins                    Marie Mills PT, DPT 07/05/22, 11:46  AM

## 2022-07-05 NOTE — Treatment Plan (Signed)
Diagnosis: Lumbar spine infection Baseline Creatinine <1  Culture Result: ESBL e.coli, pseudomonas and Klebsiella  Allergies  Allergen Reactions   Penicillins Anaphylaxis    Has patient had a PCN reaction causing immediate rash, facial/tongue/throat swelling, SOB or lightheadedness with hypotension: Yes Has patient had a PCN reaction causing severe rash involving mucus membranes or skin necrosis: No Has patient had a PCN reaction that required hospitalization Yes Has patient had a PCN reaction occurring within the last 10 years: No If all of the above answers are "NO", then may proceed with Cephalosporin use.    Codeine Other (See Comments)    Chest Pain   Sulfa Antibiotics Hives    OPAT Orders Discharge antibiotics: Meropenem 2 grams IV every 8 hours Duration: 6 weeks End Date:08/12/22   PIC Care Per Protocol:  Labs weekly while on IV antibiotics: _X_ CBC with differential  _X_ CMP _X_ CRP _X_ ESR   _X_ Please pull PIC at completion of IV antibiotics   Fax weekly lab results  promptly to [phone]  Clinic Follow Up Appt:08/04/22 at 10.45 am   Call [phone] with any questions

## 2022-07-05 NOTE — Progress Notes (Signed)
[facility] Progress Note  Patient: Marie Mills    [id number]  DOA: 06/27/2022     Date of Service: the patient was seen and examined on 07/05/2022  No chief complaint on file.  Brief hospital course: Marie Mills is a 52 y.o. female with medical history significant for GERD, hypertension, asthma, lumbar radiculopathy and remote seizure disorder, who underwent L3-L4 left hemilaminotomy and microdiscectomy on 2/28 by Dr. Izora Ribas, who presented to the emergency room with acute onset of left groin and thigh pain and numbness.  She has been having mild sinus drainage from her incision.  MRI lumbar spine reviewed The patient was given 10 mg of IV Decadron, 1 mg of IV Dilaudid, 4 mg of IV morphine sulfate and 4 mg of IV Zofran. Contact was made with Dr. Cari Caraway who did not believe there was CSF leak but recommended admission for evaluation and management of her lumbar discitis and holding IV antibiotic therapy pending IR guided biopsy. She will be admitted to a medical bed for further evaluation and management.   Assessment and Plan:  # Postop seroma and possible lumbar discitis - This is mainly at L4-L5 level.  She is status post L3-L4 left hemilaminotomy and microdiscectomy,  - IV antibiotics were held off on admission as per ID and  Neurosurgery recommendation. - Neurosurgery consulted, S/p Decadron 4 mg q6h d/cd on 3/13, continue as needed pain medications Continue gabapentin 600 mg p.o. 3 times daily and started Robaxin 750 mg every 8 hourly ESR 16, CRP <0.5--13.9 Blood culture NGTD WBC 8.8-13.9 elevated could be due to steroids 3/15 s/p washout was done by neurosurgery,  Fluid cultures growing Klebsiella pneumonia, and Pseudomonas which are pansensitive and E. coli seems multidrug-resistant  S/p vancomycin, DC'd by ID.  Continue meropenem IV for 6 weeks, followed by Cipro and Bactrim.  Patient will need PICC line 3/18 MRI lumbar spine repeated by neurosurgery, reviewed and  documented improvement in the lumbar fluid collection.  No further surgical intervention Follow ID for further recommendation of antibiotics and duration of treatment   Diarrhea, developed after antibiotics.   Started probiotics twice daily Started Imodium as needed   Metabolic acidosis, unknown cause creatinine within normal range Bicarb 18--21--22 resolved s/p oral bicarbonate. Repeat BMP in a.m.   Hypokalemia, potassium repleted. Hypophosphatemia, Phos repleted. Monitor electrolytes and replete as needed.  Asthma, chronic - We will continue her inhalers. - We will continue Singulair.   Lumbar radiculopathy - We will continue Neurontin.   Essential hypertension -Resumed Toprol-XL 25 mg p.o. daily and losartan 50 mg p.o. daily home dose We will continue monitor BP and titrate medication accordingly   Migraine headache, continue Tylenol as needed 3/13 Imitrex 25 mg 1 dose given and as needed ordered   Morbid obesity Body mass index is 44.12 kg/m.  Interventions: Lifestyle modification advised, calorie diet and exercise advised to lose body weight      Diet: Regular diet DVT Prophylaxis: Subcutaneous Lovenox   Advance goals of care discussion: Full code  Family Communication: family was not present at bedside, at the time of interview.  The pt provided permission to discuss medical plan with the family. Opportunity was given to ask question and all questions were answered satisfactorily.   Disposition:  Pt is from Home, admitted with back pain and fluid drainage, still has Pain and on IV abx, which precludes a safe discharge. Discharge to HHPT, when clinically stable, needs neurosurgery and ID clearance.  Subjective: No significant events overnight, patient is  still having significant pain in the lower back radiating to left lower extremity.  Currently pain is 7/10.  Patient is also feeling numbness in the left lower extremity.  Denied any new complaints, no chest  pain or palpitation no shortness of breath.   Physical Exam: General: NAD, lying comfortably Appear in no distress, affect appropriate Eyes: PERRLA ENT: Oral Mucosa Clear, moist  Neck: no JVD,  Cardiovascular: S1 and S2 Present, no Murmur,  Respiratory: good respiratory effort, Bilateral Air entry equal and Decreased, no Crackles, no wheezes Abdomen: Bowel Sound present, Soft and no tenderness,  Skin: no rashes Extremities: no Pedal edema, no calf tenderness Neurologic: without any new focal findings Gait not checked due to patient safety concerns  Vitals:   07/03/22 1535 07/04/22 0035 07/04/22 0740 07/05/22 0858  BP: (!) 143/87 (!) 113/51 (!) 144/80 133/74  Pulse: 89 90 99 99  Resp: 16 18 17 18   Temp: 98.3 F (36.8 C) 97.7 F (36.5 C) 97.9 F (36.6 C) 98.1 F (36.7 C)  TempSrc:      SpO2: 98% 93% 98% 98%  Weight:      Height:        Intake/Output Summary (Last 24 hours) at 07/05/2022 1329 Last data filed at 07/05/2022 0541 Gross per 24 hour  Intake 575.71 ml  Output 0 ml  Net 575.71 ml   Filed Weights   06/27/22 1842 07/01/22 0649  Weight: 124 kg 124 kg    Data Reviewed: I have personally reviewed and interpreted daily labs, tele strips, imagings as discussed above. I reviewed all nursing notes, pharmacy notes, vitals, pertinent old records I have discussed plan of care as described above with RN and patient/family.  CBC: Recent Labs  Lab 07/01/22 1013 07/02/22 0521 07/03/22 0421 07/04/22 0430 07/05/22 0554  WBC 11.0* 11.1* 11.3* 8.9 9.6  HGB 12.5 11.1* 11.5* 11.2* 10.9*  HCT 39.6 34.0* 36.0 34.6* 33.6*  MCV 87.2 86.1 87.2 86.5 86.8  PLT 290 292 321 288 [id number]   Basic Metabolic Panel: Recent Labs  Lab 07/01/22 1013 07/02/22 0521 07/03/22 0421 07/04/22 0430 07/05/22 0554  NA 139 138 133* 135 134*  K 3.6 3.0* 3.4* 3.4* 3.6  CL 105 108 107 104 102  CO2 21* 22 19* 22 23  GLUCOSE 98 97 91 90 99  BUN 12 15 17 11 12   CREATININE 0.72 0.69 0.62 0.58  0.61  CALCIUM 8.8* 8.4* 8.0* 8.0* 8.4*  MG 1.9 2.0 2.0 2.1 2.4  PHOS 2.3* 3.4 2.3* 2.9 2.2*    Studies: MR Lumbar Spine W Wo Contrast  Result Date: 07/04/2022 CLINICAL DATA:  Lumbar radiculopathy. EXAM: MRI LUMBAR SPINE WITHOUT AND WITH CONTRAST TECHNIQUE: Multiplanar and multiecho pulse sequences of the lumbar spine were obtained without and with intravenous contrast. CONTRAST:  19mL GADAVIST GADOBUTROL 1 MMOL/ML IV SOLN COMPARISON:  Prior MRI 06/27/2022 FINDINGS: Segmentation: There are five lumbar type vertebral bodies. The last full intervertebral disc space is labeled L5-S1. Alignment:  Normal Vertebrae: Normal marrow signal. No findings suspicious for discitis or osteomyelitis. Conus medullaris and cauda equina: Conus extends to the L1 level. Conus and cauda equina appear normal. Paraspinal and other soft tissues: No significant paraspinal or retroperitoneal findings. No prevertebral or psoas abscess. Expected postoperative changes from prior lumbar laminectomy at L3-4 and recent wound irrigation procedure. Disc levels: L1-2: No significant findings. L2-3: Mild diffuse annular bulge appears stable. Flattening of the ventral thecal sac and mild bilateral lateral recess stenosis. Mild facet disease but no  significant foraminal stenosis. L3-4: Prior right-sided laminectomy and new postoperative changes from wound debridement. There is a fluid collection in the laminectomy defect which appears to communicate with the left facet joint. Persistent diffuse bulging annulus with flattening of the ventral thecal sac and bilateral lateral recess stenosis. There is also mild bilateral foraminal stenosis which is stable. Moderate bilateral facet disease. L4-5: Persistent moderate-sized central disc protrusion in conjunction facet disease and ligamentum flavum thickening contributing to mild spinal and moderate bilateral lateral recess stenosis. No significant foraminal stenosis. L5-S1: Small central disc  protrusion but no spinal, lateral recess or foraminal stenosis. Stable facet disease. IMPRESSION: 1. Expected postoperative changes at L3-4 with a fluid collection in the laminectomy defect which appears to communicate with the left facet joint. Persistent diffuse bulging annulus with flattening of the ventral thecal sac and bilateral lateral recess stenosis. There is also mild bilateral foraminal stenosis which is stable. 2. Persistent moderate-sized central disc protrusion at L4-5 in conjunction with facet disease and ligamentum flavum thickening contributing to mild to moderate spinal and moderate bilateral lateral recess stenosis. 3. Small central disc protrusion at L5-S1 but no spinal, lateral recess or foraminal stenosis. Electronically Signed   By: Marijo Sanes M.D.   On: 07/04/2022 16:40    Scheduled Meds:  acetaminophen  650 mg Oral Q6H   Or   acetaminophen  650 mg Rectal Q6H   enoxaparin (LOVENOX) injection  0.5 mg/kg Subcutaneous Q24H   gabapentin  600 mg Oral TID   losartan  50 mg Oral Daily   methocarbamol  750 mg Oral Q8H   metoprolol succinate  25 mg Oral Daily   pantoprazole  40 mg Oral Daily   phosphorus  500 mg Oral TID   saccharomyces boulardii  250 mg Oral BID   sodium chloride flush  3 mL Intravenous Q12H   Continuous Infusions:  sodium chloride Stopped (07/01/22 1622)   sodium chloride     sodium chloride Stopped (07/04/22 1541)   meropenem (MERREM) IV 2 g (07/05/22 0657)   PRN Meds: albuterol, HYDROmorphone (DILAUDID) injection, magnesium hydroxide, menthol-cetylpyridinium **OR** phenol, ondansetron **OR** ondansetron (ZOFRAN) IV, oxyCODONE, sodium chloride flush, SUMAtriptan, traZODone  Time spent: 35 minutes  Author: Val Riles. MD [facility] 07/05/2022 1:29 PM  To reach On-call, see care teams to locate the attending and reach out to them via [email or web address]. If 7PM-7AM, please contact night-coverage If you still have difficulty reaching the attending  provider, please page the [facility] (Director on Call) for [facility] on amion for assistance.

## 2022-07-05 NOTE — Progress Notes (Addendum)
Due to Body Habitus she will require a bariatric Commode Patient is not able to walk the distance required to go the bathroom, or he/she is unable to safely negotiate stairs required to access the bathroom.  A 3in1 BSC will alleviate this problem

## 2022-07-05 NOTE — Progress Notes (Signed)
PHARMACY CONSULT NOTE FOR:  OUTPATIENT  PARENTERAL ANTIBIOTIC THERAPY (OPAT)  Indication: Lumbar spine infection with ESBL E coli, K. Pneumoniae, and P aeruginosa Regimen: meropenem 2gm IV q8h End date: 08/12/2022  Labs - Once weekly:  CBC/D, CMP, CRP and ESR Please pull PIC at completion of IV antibiotics Fax weekly lab results  promptly to [phone]  IV antibiotic discharge orders are pended. To discharging provider:  please sign these orders via discharge navigator,  Select New Orders & click on the button choice - Manage This Unsigned Work.     Thank you for allowing pharmacy to be a part of this patient's care.  Doreene Eland, PharmD, BCPS, BCIDP Work Cell: [phone] 07/05/2022 2:42 PM

## 2022-07-05 NOTE — Plan of Care (Signed)
  Problem: Pain Management: Goal: Pain level will decrease Outcome: Progressing   Problem: Skin Integrity: Goal: Will show signs of wound healing Outcome: Progressing   Problem: Bladder/Genitourinary: Goal: Urinary functional status for postoperative course will improve Outcome: Progressing

## 2022-07-05 NOTE — Progress Notes (Addendum)
    Attending Progress Note  History: Marie Mills is s/p left L3-4 on 06/15/22 presenting with numbness and clear wound drainage and new neurologic symptoms  POD4: Patient continues to have left leg pain that is largely unchanged from yesterday. POD3: continued leg pain. POD2: Patient states that she has continued pain and numbness on left. Drain put out 60 cc. She did stand yesterday. Cultures now showing E Coli and Klebsiella   HD3: continued leg pain and drainage from incision HD2: improved leg pain but continued numbness and back pain  Physical Exam: Vitals:   07/04/22 0740 07/05/22 0858  BP: (!) 144/80 133/74  Pulse: 99 99  Resp: 17 18  Temp: 97.9 F (36.6 C) 98.1 F (36.7 C)  SpO2: 98% 98%    AA Ox3 CNI  Strength:5/5 throughout RLE. 4-/5 LLE, strength exam limited due to patient's pain and effort Incision covered with clean dressing   Results: CRP 13.9 on 3/13 Wound cultures: E Coli and Klebsiella  Assessment/Plan:  Laveda Mills 52 y.o. female who is s/p L L3/4 microdiscectomy (uncomplicated) who presents with numbness and now s/p wound infection washout. MRI on 3/18 showing improvement in throughout the lumbar fluid collection.    - mobilize - pain control - DVT prophylaxis - PTOT -  Antibiotics per ID  Cooper Render PA-C Department of Neurosurgery  Addendum to the above note.  The patient was seen at approximately 6:30 PM.  She had a much better day today.  She is able to walk 60 feet.  Her pain is improved.3  If her pain remains under reasonable control, 1 could consider disposition planning in the next 1 to 2 days once final antibiotic plan is available.

## 2022-07-05 NOTE — Progress Notes (Signed)
Occupational Therapy Treatment Patient Details Name: Marie Mills MRN: [id number] DOB: [birth year] Today's Date: 07/05/2022   History of present illness Marie Mills is a 52 y.o. female with medical history significant for GERD, hypertension, asthma, lumbar radiculopathy and remote seizure disorder, who underwent L3-L4 left hemilaminotomy and microdiscectomy on 2/28 by Dr. Izora Ribas, who presented to the emergency room with acute onset of left groin and thigh pain and numbness.  She has been having mild drainage from her incision. MD assessment includes: postop seroma and possible lumbar discitis, wound infection s/p washout, diarrhea, metabolic acidosis, and hypokalemia.   OT comments  Patient received supine in bed and agreeable to OT. Tx session targeted increasing activity tolerance for improved ADL completion. Pt able to come to EOB with supervision this date. Min VC required for log roll technique. Pt then stood from EOB and completed functional mobility to bedside sink in order to engage in sinkside grooming tasks. Pt then requesting to return to EOB 2/2 back and LLE pain. She engaged in seated BUE AROM exercises (see details below). Pt left as received with all needs in reach. Pt is making progress toward goal completion. D/C recommendation remains appropriate. OT will continue to follow acutely.   Pt is motivated to go home. Discussed that daughter works third shift and is considering looking into having someone come to her house to assist with ADLs/IADLs as needed during the day.   Recommendations for follow up therapy are one component of a multi-disciplinary discharge planning process, led by the attending physician.  Recommendations may be updated based on patient status, additional functional criteria and insurance authorization.    Follow Up Recommendations  Home health OT     Assistance Recommended at Discharge Intermittent Supervision/Assistance  Patient can return home with the  following  A little help with walking and/or transfers;A little help with bathing/dressing/bathroom;Assistance with cooking/housework;Assist for transportation;Help with stairs or ramp for entrance   Equipment Recommendations  BSC/3in1 (bariatric BSC)    Recommendations for Other Services      Precautions / Restrictions Precautions Precautions: Back;Fall Precaution Booklet Issued: No Restrictions Weight Bearing Restrictions: No Other Position/Activity Restrictions: No brace needed       Mobility Bed Mobility Overal bed mobility: Needs Assistance Bed Mobility: Rolling, Sidelying to Sit, Sit to Sidelying Rolling: Supervision Sidelying to sit: Supervision     Sit to sidelying: Supervision General bed mobility comments: Min VCs for log roll technique    Transfers Overall transfer level: Needs assistance Equipment used: None Transfers: Sit to/from Stand Sit to Stand: Supervision                 Balance Overall balance assessment: Needs assistance Sitting-balance support: Feet supported Sitting balance-Leahy Scale: Normal     Standing balance support: During functional activity, No upper extremity supported Standing balance-Leahy Scale: Good                             ADL either performed or assessed with clinical judgement   ADL Overall ADL's : Needs assistance/impaired     Grooming: Supervision/safety;Standing;Set up;Wash/dry face                   Toilet Transfer: Copy Details (indicate cue type and reason): simulated         Functional mobility during ADLs: Supervision/safety (~53ft to bedside sink) General ADL Comments: Pt deferred toileting this date.    Extremity/Trunk Assessment Upper Extremity Assessment  Upper Extremity Assessment: Overall WFL for tasks assessed   Lower Extremity Assessment Lower Extremity Assessment: Overall WFL for tasks assessed   Cervical / Trunk Assessment Cervical /  Trunk Assessment: Back Surgery    Vision Baseline Vision/History: 1 Wears glasses Patient Visual Report: No change from baseline     Perception     Praxis      Cognition Arousal/Alertness: Awake/alert Behavior During Therapy: WFL for tasks assessed/performed Overall Cognitive Status: Within Functional Limits for tasks assessed            Exercises General Exercises - Upper Extremity Shoulder Flexion: AROM, Both, 10 reps, Seated Shoulder Horizontal ABduction: AROM, Both, 10 reps, Seated Shoulder Horizontal ADduction: AROM, Both, 10 reps, Seated Elbow Flexion: AROM, Both, 10 reps, Seated Elbow Extension: AROM, Both, 10 reps, Seated    Shoulder Instructions       General Comments      Pertinent Vitals/ Pain       Pain Assessment Pain Assessment: Faces Faces Pain Scale: Hurts little more Pain Location: LLE and back Pain Descriptors / Indicators: Aching, Sore, Sharp, Shooting Pain Intervention(s): Limited activity within patient's tolerance, Monitored during session, Repositioned  Home Living            Prior Functioning/Environment              Frequency  Min 2X/week        Progress Toward Goals  OT Goals(current goals can now be found in the care plan section)  Progress towards OT goals: Progressing toward goals  Acute Rehab OT Goals Patient Stated Goal: reduce pain, return home OT Goal Formulation: With patient Time For Goal Achievement: 07/16/22 Potential to Achieve Goals: Good  Plan Discharge plan remains appropriate;Frequency remains appropriate    Co-evaluation                 AM-PAC OT "6 Clicks" Daily Activity     Outcome Measure   Help from another person eating meals?: None Help from another person taking care of personal grooming?: A Little Help from another person toileting, which includes using toliet, bedpan, or urinal?: A Little Help from another person bathing (including washing, rinsing, drying)?: A Little Help  from another person to put on and taking off regular upper body clothing?: None Help from another person to put on and taking off regular lower body clothing?: A Little 6 Click Score: 20    End of Session    OT Visit Diagnosis: Other abnormalities of gait and mobility (R26.89);Muscle weakness (generalized) (M62.81);Pain   Activity Tolerance Patient tolerated treatment well   Patient Left in bed;with call bell/phone within reach;with bed alarm set   Nurse Communication Mobility status        Time: [id number] OT Time Calculation (min): 17 min  Charges: OT General Charges $OT Visit: 1 Visit OT Treatments $Self Care/Home Management : 8-22 mins  [facility] MS, OTR/L ascom [phone]  07/05/22, 3:51 PM

## 2022-07-05 NOTE — Progress Notes (Signed)
   Date of Admission:  06/27/2022    ID: Marie Mills is a 52 y.o. 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   H/O microdiscectomy   Postoperative wound infection   Infection of lumbar spine (HCC)   Epidural abscess    Subjective: Pt doing slightly better Was able to walk to the bathroom Still has pain inner thigh, genitalia No difficulty in passing urine   Medications:   acetaminophen  650 mg Oral Q6H   Or   acetaminophen  650 mg Rectal Q6H   enoxaparin (LOVENOX) injection  0.5 mg/kg Subcutaneous Q24H   gabapentin  600 mg Oral TID   losartan  50 mg Oral Daily   methocarbamol  750 mg Oral Q8H   metoprolol succinate  25 mg Oral Daily   pantoprazole  40 mg Oral Daily   phosphorus  500 mg Oral TID   saccharomyces boulardii  250 mg Oral BID   sodium chloride flush  3 mL Intravenous Q12H    Objective: Vital signs in last 24 hours: Patient Vitals for the past 24 hrs:  BP Temp Pulse Resp SpO2  07/05/22 0858 133/74 98.1 F (36.7 C) 99 18 98 %      PHYSICAL EXAM:  General: awake and alert,  Lungs: Clear to auscultation bilaterally. No Wheezing or Rhonchi. No rales. Heart: Tachycardia Abdomen: Soft, non-tender,not distended. Bowel sounds normal. No masses Extremities: atraumatic, no cyanosis. No edema. No clubbing Skin: surgical site covered with dressing No vesicular lesions over the inner thigh or genitalia Lymph: Cervical, supraclavicular normal. Neurologic: moves all extremities Sensation not assessed  Lab Results Recent Labs    07/04/22 0430 07/05/22 0554  WBC 8.9 9.6  HGB 11.2* 10.9*  HCT 34.6* 33.6*  NA 135 134*  K 3.4* 3.6  CL 104 102  CO2 22 23  BUN 11 12  CREATININE 0.58 0.61   Microbiology: BC ng so far WC- superficial  klebsiella Pseudomonas And from epidural fluid- ESBl ecoli and  klebsiella  Imaging L3-L4 left hemilaminotomy and microdiscectomy,  fluid collection extending from the skin surface to the posterior and anterior aspect of the thecal sac at L3-L4, causing moderate thecal sac narrowing.  L2-L3, L3-L4, and L4-L5, as well as in the bilateral L3-L4 facets, concerning for infection Contrast enhancement at the posterior aspect of the L4-L5 disc, concerning for discitis. epidural fluid collection along the dorsal aspect of the thecal sac from T12-L1 through L3-L4, possibly a CSF leak, which causes additional moderate thecal sac narrowing at L1-L2 and L2-L3. Assessment/Plan: Pt is 2 weeks post L3-L4 hemilaminotomy and microdiscectomy for lumbar radiculopathy. Presenting with severe pain and numbness over left buttock, groin and labia.  Epidural abscess T12-L1 L3-L4, ?discitis L4-L5, paraspinal abscess, and superficial infection S/p surgery and culture multiple gram neg organisms including pseudomonas, Klebsiella, ESBL ecoli on meropenem  Will need 6 weeks minimum of IV meropenem- until 08/12/22 following which she would get cipro+ bactrim  Reepat MRI improved ?  PCN allergy as a child- was told by her mom not to take it We may do cephalosporin later after we have all the microbiological information  Discussed the management with patient,  and care team

## 2022-07-05 NOTE — TOC Progression Note (Signed)
Transition of Care [facility]) - Progression Note    Patient Details  Name: Marie Mills MRN: [id number] Date of Birth: [birth year]  Transition of Care [facility]) CM/SW [place], RN Phone Number: 07/05/2022, 11:45 AM  Clinical Narrative:    Reached out to pam with [facility] about home infusion, the patient will use [facility] RN to manage the PICC line   Expected Discharge Plan: OP Rehab Barriers to Discharge: Inadequate or no insurance  Expected Discharge Plan and Services   Discharge Planning Services: CM Consult   Living arrangements for the past 2 months: Single Family Home                 DME Arranged: N/A DME Agency: NA       HH Arranged: NA           Social Determinants of Health (SDOH) Interventions SDOH Screenings   Food Insecurity: No Food Insecurity (06/28/2022)  Housing: Low Risk  (06/28/2022)  Transportation Needs: No Transportation Needs (06/28/2022)  Utilities: Not At Risk (06/28/2022)  Alcohol Screen: Low Risk  (03/23/2022)  Depression (PHQ2-9): Low Risk  (03/05/2021)  Recent Concern: Depression (PHQ2-9) - Medium Risk (02/03/2021)  Financial Resource Strain: High Risk (03/23/2022)  Physical Activity: Inactive (03/23/2022)  Social Connections: Moderately Isolated (03/23/2022)  Stress: Stress Concern Present (03/23/2022)  Tobacco Use: Low Risk  (07/02/2022)    Readmission Risk Interventions     No data to display

## 2022-07-06 ENCOUNTER — Other Ambulatory Visit: Payer: Self-pay

## 2022-07-06 DIAGNOSIS — M5416 Radiculopathy, lumbar region: Secondary | ICD-10-CM | POA: Diagnosis not present

## 2022-07-06 DIAGNOSIS — G062 Extradural and subdural abscess, unspecified: Secondary | ICD-10-CM | POA: Diagnosis not present

## 2022-07-06 DIAGNOSIS — M4626 Osteomyelitis of vertebra, lumbar region: Secondary | ICD-10-CM | POA: Diagnosis not present

## 2022-07-06 DIAGNOSIS — M4646 Discitis, unspecified, lumbar region: Secondary | ICD-10-CM | POA: Diagnosis not present

## 2022-07-06 LAB — HEPATIC FUNCTION PANEL
ALT: 51 U/L — ABNORMAL HIGH (ref 0–44)
AST: 36 U/L (ref 15–41)
Albumin: 3 g/dL — ABNORMAL LOW (ref 3.5–5.0)
Alkaline Phosphatase: 111 U/L (ref 38–126)
Bilirubin, Direct: 0.1 mg/dL (ref 0.0–0.2)
Indirect Bilirubin: 0.5 mg/dL (ref 0.3–0.9)
Total Bilirubin: 0.6 mg/dL (ref 0.3–1.2)
Total Protein: 6.3 g/dL — ABNORMAL LOW (ref 6.5–8.1)

## 2022-07-06 LAB — BASIC METABOLIC PANEL
Anion gap: 5 (ref 5–15)
BUN: 14 mg/dL (ref 6–20)
CO2: 25 mmol/L (ref 22–32)
Calcium: 8.2 mg/dL — ABNORMAL LOW (ref 8.9–10.3)
Chloride: 108 mmol/L (ref 98–111)
Creatinine, Ser: 0.58 mg/dL (ref 0.44–1.00)
GFR, Estimated: >60 mL/min (ref >60)
Glucose, Bld: 83 mg/dL (ref 70–99)
Potassium: 3.7 mmol/L (ref 3.5–5.1)
Sodium: 138 mmol/L (ref 135–145)

## 2022-07-06 LAB — C-REACTIVE PROTEIN: CRP: 0.9 mg/dL (ref <1.0)

## 2022-07-06 LAB — CBC
HCT: 32.9 % — ABNORMAL LOW (ref 36.0–46.0)
Hemoglobin: 10.6 g/dL — ABNORMAL LOW (ref 12.0–15.0)
MCH: 28.2 pg (ref 26.0–34.0)
MCHC: 32.2 g/dL (ref 30.0–36.0)
MCV: 87.5 fL (ref 80.0–100.0)
Platelets: 310 10*3/uL (ref 150–400)
RBC: 3.76 MIL/uL — ABNORMAL LOW (ref 3.87–5.11)
RDW: 13.8 % (ref 11.5–15.5)
WBC: 8.3 10*3/uL (ref 4.0–10.5)
nRBC: 0 % (ref 0.0–0.2)

## 2022-07-06 LAB — MAGNESIUM: Magnesium: 2.5 mg/dL — ABNORMAL HIGH (ref 1.7–2.4)

## 2022-07-06 LAB — PHOSPHORUS: Phosphorus: 3.2 mg/dL (ref 2.5–4.6)

## 2022-07-06 LAB — SEDIMENTATION RATE: Sed Rate: 56 mm/hr — ABNORMAL HIGH (ref 0–20)

## 2022-07-06 MED ORDER — ENSURE ENLIVE PO LIQD
237.0000 mL | ORAL | Status: DC
  Administered 2022-07-06 – 2022-07-07 (×2): 237 mL via ORAL

## 2022-07-06 MED ORDER — CHLORHEXIDINE GLUCONATE CLOTH 2 % EX PADS
6.0000 | MEDICATED_PAD | Freq: Every day | CUTANEOUS | Status: DC
  Administered 2022-07-06 – 2022-07-07 (×2): 6 via TOPICAL

## 2022-07-06 MED ORDER — SODIUM CHLORIDE 0.9% FLUSH: 10.0000 mL | INTRAVENOUS | Status: DC | PRN

## 2022-07-06 MED ORDER — LIDOCAINE HCL (PF) 1 % IJ SOLN
INTRAMUSCULAR | Status: AC
  Filled 2022-07-06: qty 30

## 2022-07-06 MED ORDER — FLUCONAZOLE 50 MG PO TABS
150.0000 mg | ORAL_TABLET | Freq: Once | ORAL | Status: AC
  Administered 2022-07-06: 150 mg via ORAL
  Filled 2022-07-06: qty 1

## 2022-07-06 MED ORDER — SODIUM CHLORIDE 0.9% FLUSH
10.0000 mL | Freq: Two times a day (BID) | INTRAVENOUS | Status: DC
  Administered 2022-07-07 (×2): 10 mL

## 2022-07-06 NOTE — Progress Notes (Signed)
Physical Therapy Treatment Patient Details Name: Marie Mills MRN: [id number] DOB: 09/12/1970 Today's Date: 07/06/2022   History of Present Illness Marie Mills is a 52 y.o. female with medical history significant for GERD, hypertension, asthma, lumbar radiculopathy and remote seizure disorder, who underwent L3-L4 left hemilaminotomy and microdiscectomy on 2/28 by Dr. Izora Ribas, who presented to the emergency room with acute onset of left groin and thigh pain and numbness.  She has been having mild drainage from her incision. MD assessment includes: postop seroma and possible lumbar discitis, wound infection s/p washout, diarrhea, metabolic acidosis, and hypokalemia.    PT Comments    Pt was pleasant and motivated to participate during the session and put forth good effort throughout. Pt mod Ind with log rolling coming to sitting with good speed and effort.  Pt with only min instability during balance training per below but pt able to self correct and required no physical assistance during the session.  Pt demonstrated min drifting left/right upon initiating gait without an AD but able to self-correct and stability grossly improved as session progressed.  Pt education provided on use of RW at home until cleared by HHPT.  Pt will benefit from HHPT upon discharge to safely address deficits listed in patient problem list for decreased caregiver assistance and eventual return to PLOF.     Recommendations for follow up therapy are one component of a multi-disciplinary discharge planning process, led by the attending physician.  Recommendations may be updated based on patient status, additional functional criteria and insurance authorization.  Follow Up Recommendations  Home health PT Can patient physically be transported by private vehicle: Yes   Assistance Recommended at Discharge Intermittent Supervision/Assistance  Patient can return home with the following Assistance with  cooking/housework;Direct supervision/assist for medications management;Assist for transportation;A little help with walking and/or transfers;A little help with bathing/dressing/bathroom   Equipment Recommendations  None recommended by PT    Recommendations for Other Services       Precautions / Restrictions Precautions Precautions: Back;Fall Restrictions Weight Bearing Restrictions: No Other Position/Activity Restrictions: No brace needed     Mobility  Bed Mobility Overal bed mobility: Needs Assistance Bed Mobility: Rolling, Sidelying to Sit Rolling: Modified independent (Device/Increase time) Sidelying to sit: Modified independent (Device/Increase time)       General bed mobility comments: Good carryover of log roll sequencing with good speed and effort during sidelying to sit    Transfers Overall transfer level: Needs assistance Equipment used: None Transfers: Sit to/from Stand Sit to Stand: Supervision           General transfer comment: Very good control and stability with only minimal extra time and effort to come to standing    Ambulation/Gait Ambulation/Gait assistance: Supervision Gait Distance (Feet): 50 Feet Assistive device: None Gait Pattern/deviations: Drifts right/left Gait velocity: decreased     General Gait Details: Occasional min drifting left/right upon initiating gait with pt able to self-correct but no overt LOB and with stability improving as session progressed   Stairs             Wheelchair Mobility    Modified Rankin (Stroke Patients Only)       Balance Overall balance assessment: Needs assistance Sitting-balance support: Feet supported Sitting balance-Leahy Scale: Normal     Standing balance support: During functional activity, No upper extremity supported Standing balance-Leahy Scale: Good  Cognition Arousal/Alertness: Awake/alert Behavior During Therapy: WFL for tasks  assessed/performed Overall Cognitive Status: Within Functional Limits for tasks assessed                                          Exercises Total Joint Exercises Long Arc Quad: Strengthening, Both, 15 reps, 10 reps Knee Flexion: Strengthening, Both, 15 reps, 10 reps Other Exercises Other Exercises: Static standing balance training with feet apart, together, and semi-tandem with combinations of eyes open/closed and head still/head turns    General Comments        Pertinent Vitals/Pain Pain Assessment Pain Assessment: 0-10 Pain Score: 6  Pain Location: LLE Pain Descriptors / Indicators: Aching, Sore, Sharp, Shooting Pain Intervention(s): Premedicated before session, Repositioned, Monitored during session    Home Living                          Prior Function            PT Goals (current goals can now be found in the care plan section) Progress towards PT goals: Progressing toward goals    Frequency    7X/week      PT Plan Current plan remains appropriate    Co-evaluation              AM-PAC PT "6 Clicks" Mobility   Outcome Measure  Help needed turning from your back to your side while in a flat bed without using bedrails?: None Help needed moving from lying on your back to sitting on the side of a flat bed without using bedrails?: None Help needed moving to and from a bed to a chair (including a wheelchair)?: A Little Help needed standing up from a chair using your arms (e.g., wheelchair or bedside chair)?: A Little Help needed to walk in hospital room?: A Little Help needed climbing 3-5 steps with a railing? : A Little 6 Click Score: 20    End of Session Equipment Utilized During Treatment: Gait belt Activity Tolerance: Patient tolerated treatment well Patient left: in chair;with call bell/phone within reach Nurse Communication: Mobility status PT Visit Diagnosis: Muscle weakness (generalized) (M62.81);Pain;Difficulty in  walking, not elsewhere classified (R26.2) Pain - Right/Left: Left Pain - part of body:  (back)     Time: [id number] PT Time Calculation (min) (ACUTE ONLY): 25 min  Charges:  $Gait Training: 8-22 mins $Therapeutic Exercise: 8-22 mins                     D. Scott Bridney Guadarrama PT, DPT 07/06/22, 10:55 AM

## 2022-07-06 NOTE — TOC CM/SW Note (Addendum)
Hospital Bed   Patient suffers from chronic back  pain which is caused by Lumbar discitis, postoperative wound infection, infection of lumbar spine, and Epidural abscess.  Hospital bed will help alleviate  pain by allowing patient to reposition her back in ways not feasible with a normal bed.   Upon awakening will require frequent and immediate                                                                                                                                                                              changes in body position which cannot be achieved with a normal bed.

## 2022-07-06 NOTE — Progress Notes (Signed)
Peripherally Inserted Central Catheter Placement  The IV Nurse has discussed with the patient and/or persons authorized to consent for the patient, the purpose of this procedure and the potential benefits and risks involved with this procedure.  The benefits include less needle sticks, lab draws from the catheter, and the patient may be discharged home with the catheter. Risks include, but not limited to, infection, bleeding, blood clot (thrombus formation), and puncture of an artery; nerve damage and irregular heartbeat and possibility to perform a PICC exchange if needed/ordered by physician.  Alternatives to this procedure were also discussed.  Bard Power PICC patient education guide, fact sheet on infection prevention and patient information card has been provided to patient /or left at bedside.    PICC Placement Documentation  PICC Single Lumen [id number] Right Cephalic 40 cm 0 cm (Active)  Indication for Insertion or Continuance of Line Home intravenous therapies (PICC only) 07/06/22 1920  Exposed Catheter (cm) 0 cm 07/06/22 1920  Site Assessment Clean, Dry, Intact 07/06/22 1920  Line Status Flushed;Saline locked;Blood return noted 07/06/22 1920  Dressing Type Transparent;Securing device 07/06/22 1920  Dressing Status Antimicrobial disc in place;Clean, Dry, Intact 07/06/22 1920  Safety Lock Not Applicable [id number]  Line Care Connections checked and tightened 07/06/22 1920  Dressing Intervention New dressing 07/06/22 1920  Dressing Change Due 07/13/22 07/06/22 [place] 07/06/2022, 7:45 PM

## 2022-07-06 NOTE — Clinical Social Work Note (Signed)
    Durable Medical Equipment  (From admission, onward)           Start     Ordered   07/06/22 1610  For home use only DME Hospital bed  Once       Question Answer Comment  Length of Need 6 Months   Bed type Semi-electric      07/06/22 1609   07/05/22 1429  For home use only DME Bedside commode  Once       Comments: Bariatric Due to body habitus needs Bariatric commode  Question:  Patient needs a bedside commode to treat with the following condition  Answer:  Impaired mobility   07/05/22 1429

## 2022-07-06 NOTE — Progress Notes (Signed)
   Date of Admission:  06/27/2022    ID: Marie Mills is a 52 y.o. 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   H/O microdiscectomy   Postoperative wound infection   Infection of lumbar spine (HCC)   Epidural abscess    Subjective: Feeling better   Medications:   acetaminophen  650 mg Oral Q6H   Or   acetaminophen  650 mg Rectal Q6H   enoxaparin (LOVENOX) injection  0.5 mg/kg Subcutaneous Q24H   feeding supplement  237 mL Oral Q24H   gabapentin  600 mg Oral TID   losartan  50 mg Oral Daily   methocarbamol  750 mg Oral Q8H   metoprolol succinate  25 mg Oral Daily   pantoprazole  40 mg Oral Daily   saccharomyces boulardii  250 mg Oral BID   sodium chloride flush  3 mL Intravenous Q12H    Objective: Vital signs in last 24 hours: Patient Vitals for the past 24 hrs:  BP Temp Pulse Resp SpO2  07/06/22 1216 128/61 98.3 F (36.8 C) 86 18 97 %  07/06/22 0821 125/66 98.1 F (36.7 C) 86 16 95 %  07/05/22 2348 (!) 120/54 97.9 F (36.6 C) 98 15 98 %  07/05/22 1550 (!) 122/55 98 F (36.7 C) 90 16 97 %      PHYSICAL EXAM:  General: awake and alert,  Lungs: Clear to auscultation bilaterally. No Wheezing or Rhonchi. No rales. Heart: Tachycardia Abdomen: Soft, non-tender,not distended. Bowel sounds normal. No masses Extremities: atraumatic, no cyanosis. No edema. No clubbing Skin: surgical site covered with dressing No vesicular lesions over the inner thigh or genitalia Lymph: Cervical, supraclavicular normal. Neurologic: moves all extremities   Lab Results    Recent Labs    07/05/22 0554 07/06/22 0533  WBC 9.6 8.3  HGB 10.9* 10.6*  HCT 33.6* 32.9*  NA 134* 138  K 3.6 3.7  CL 102 108  CO2 23 25  BUN 12 14  CREATININE 0.61 0.58   Microbiology: BC ng so far WC- superficial  klebsiella Pseudomonas And from  epidural fluid- ESBl ecoli and klebsiella  Imaging L3-L4 left hemilaminotomy and microdiscectomy,  fluid collection extending from the skin surface to the posterior and anterior aspect of the thecal sac at L3-L4, causing moderate thecal sac narrowing.  L2-L3, L3-L4, and L4-L5, as well as in the bilateral L3-L4 facets, concerning for infection Contrast enhancement at the posterior aspect of the L4-L5 disc, concerning for discitis. epidural fluid collection along the dorsal aspect of the thecal sac from T12-L1 through L3-L4, possibly a CSF leak, which causes additional moderate thecal sac narrowing at L1-L2 and L2-L3. Assessment/Plan: Pt is 2 weeks post L3-L4 hemilaminotomy and microdiscectomy for lumbar radiculopathy. Presenting with severe pain and numbness over left buttock, groin and labia.  Epidural abscess T12-L1 L3-L4, ?discitis L4-L5, paraspinal abscess, and superficial infection S/p surgery and culture multiple gram neg organisms including pseudomonas, Klebsiella, ESBL ecoli on meropenem  Will need 6 weeks minimum of IV meropenem- until 08/12/22 following which she would get cipro+ bactrim Repeat  MRI improved Anemia  PCN allergy as a child- was told by her mom not to take it  Discussed the management with patient,  and care team PICc order placed OPAT order placed ID will sign off- call if needed Follow up appt on 08/04/22

## 2022-07-06 NOTE — Progress Notes (Signed)
[facility] Progress Note  Patient: Marie Mills    [id number]  DOA: 06/27/2022     Date of Service: the patient was seen and examined on 07/06/2022  No chief complaint on file.  Brief hospital course: Marie Mills is a 52 y.o. female with medical history significant for GERD, hypertension, asthma, lumbar radiculopathy and remote seizure disorder, who underwent L3-L4 left hemilaminotomy and microdiscectomy on 2/28 by Dr. Izora Ribas, who presented to the emergency room with acute onset of left groin and thigh pain and numbness.  She has been having mild sinus drainage from her incision.  MRI lumbar spine reviewed The patient was given 10 mg of IV Decadron, 1 mg of IV Dilaudid, 4 mg of IV morphine sulfate and 4 mg of IV Zofran. Contact was made with Dr. Cari Caraway who did not believe there was CSF leak but recommended admission for evaluation and management of her lumbar discitis and holding IV antibiotic therapy pending IR guided biopsy. She will be admitted to a medical bed for further evaluation and management.   Assessment and Plan:  # Postop seroma and possible lumbar discitis - This is mainly at L4-L5 level.  She is status post L3-L4 left hemilaminotomy and microdiscectomy,  - IV antibiotics were held off on admission as per ID and  Neurosurgery recommendation. - Neurosurgery consulted, S/p Decadron 4 mg q6h d/cd on 3/13, continue as needed pain medications Continue gabapentin 600 mg p.o. 3 times daily and started Robaxin 750 mg every 8 hourly ESR 16, CRP <0.5--13.9 Blood culture NGTD WBC 8.8-13.9 elevated could be due to steroids 3/15 s/p washout was done by neurosurgery,  Fluid cultures growing Klebsiella pneumonia, and Pseudomonas which are pansensitive and E. coli seems multidrug-resistant  S/p vancomycin, DC'd by ID.  Continue meropenem IV for 6 weeks end date 08/12/22, followed by Cipro and Bactrim.  Patient will need PICC line 3/18 MRI lumbar spine repeated by  neurosurgery, reviewed and documented improvement in the lumbar fluid collection.  No further surgical intervention Follow ID for further recommendation of antibiotics and duration of treatment   Diarrhea, developed after antibiotics.   Started probiotics twice daily Started Imodium as needed   Metabolic acidosis, unknown cause creatinine within normal range Bicarb 18--21--22 resolved s/p oral bicarbonate. Repeat BMP in a.m.   Hypokalemia, potassium repleted. Hypophosphatemia, Phos repleted. Monitor electrolytes and replete as needed.  Asthma, chronic - We will continue her inhalers. - We will continue Singulair.   Lumbar radiculopathy - We will continue Neurontin.   Essential hypertension -Resumed Toprol-XL 25 mg p.o. daily and losartan 50 mg p.o. daily home dose We will continue monitor BP and titrate medication accordingly   Migraine headache, continue Tylenol as needed 3/13 Imitrex 25 mg 1 dose given and as needed ordered   Morbid obesity Body mass index is 44.12 kg/m.  Interventions: Lifestyle modification advised, calorie diet and exercise advised to lose body weight      Diet: Regular diet DVT Prophylaxis: Subcutaneous Lovenox   Advance goals of care discussion: Full code  Family Communication: family was not present at bedside, at the time of interview.  The pt provided permission to discuss medical plan with the family. Opportunity was given to ask question and all questions were answered satisfactorily.   Disposition:  Pt is from Home, admitted with back pain and fluid drainage, still has Pain and on IV abx, which precludes a safe discharge. Discharge to [place], when clinically stable, still c/o significant pain Pending PICC line insertion and plan  to discharge home  Subjective: No significant events overnight, still has significant.  Patient is unable to sit up due to extreme pain.  Still does not feel comfortable going home in this condition   Physical  Exam: General: NAD, lying comfortably Appear in no distress, affect appropriate Eyes: PERRLA ENT: Oral Mucosa Clear, moist  Neck: no JVD,  Cardiovascular: S1 and S2 Present, no Murmur,  Respiratory: good respiratory effort, Bilateral Air entry equal and Decreased, no Crackles, no wheezes Abdomen: Bowel Sound present, Soft and no tenderness,  Skin: no rashes Extremities: no Pedal edema, no calf tenderness Neurologic: without any new focal findings Gait not checked due to patient safety concerns  Vitals:   07/05/22 2348 07/06/22 0821 07/06/22 1216 07/06/22 1537  BP: (!) 120/54 125/66 128/61 135/68  Pulse: 98 86 86 93  Resp: 15 16 18 20   Temp: 97.9 F (36.6 C) 98.1 F (36.7 C) 98.3 F (36.8 C) 98.1 F (36.7 C)  TempSrc:      SpO2: 98% 95% 97% 98%  Weight:      Height:       No intake or output data in the 24 hours ending 07/06/22 1604  Filed Weights   06/27/22 1842 07/01/22 0649  Weight: 124 kg 124 kg    Data Reviewed: I have personally reviewed and interpreted daily labs, tele strips, imagings as discussed above. I reviewed all nursing notes, pharmacy notes, vitals, pertinent old records I have discussed plan of care as described above with RN and patient/family.  CBC: Recent Labs  Lab 07/02/22 0521 07/03/22 0421 07/04/22 0430 07/05/22 0554 07/06/22 0533  WBC 11.1* 11.3* 8.9 9.6 8.3  HGB 11.1* 11.5* 11.2* 10.9* 10.6*  HCT 34.0* 36.0 34.6* 33.6* 32.9*  MCV 86.1 87.2 86.5 86.8 87.5  PLT 292 321 288 321 [id number]   Basic Metabolic Panel: Recent Labs  Lab 07/02/22 0521 07/03/22 0421 07/04/22 0430 07/05/22 0554 07/06/22 0533  NA 138 133* 135 134* 138  K 3.0* 3.4* 3.4* 3.6 3.7  CL 108 107 104 102 108  CO2 22 19* 22 23 25   GLUCOSE 97 91 90 99 83  BUN 15 17 11 12 14   CREATININE 0.69 0.62 0.58 0.61 0.58  CALCIUM 8.4* 8.0* 8.0* 8.4* 8.2*  MG 2.0 2.0 2.1 2.4 2.5*  PHOS 3.4 2.3* 2.9 2.2* 3.2    Studies: [place] EKG SITE RITE  Result Date: 07/06/2022 If Site Rite  image not attached, placement could not be confirmed due to current cardiac rhythm.   Scheduled Meds:  acetaminophen  650 mg Oral Q6H   Or   acetaminophen  650 mg Rectal Q6H   enoxaparin (LOVENOX) injection  0.5 mg/kg Subcutaneous Q24H   feeding supplement  237 mL Oral Q24H   gabapentin  600 mg Oral TID   losartan  50 mg Oral Daily   methocarbamol  750 mg Oral Q8H   metoprolol succinate  25 mg Oral Daily   pantoprazole  40 mg Oral Daily   saccharomyces boulardii  250 mg Oral BID   sodium chloride flush  3 mL Intravenous Q12H   Continuous Infusions:  sodium chloride Stopped (07/01/22 1622)   sodium chloride     sodium chloride Stopped (07/04/22 1541)   meropenem (MERREM) IV 2 g (07/06/22 1009)   PRN Meds: albuterol, HYDROmorphone (DILAUDID) injection, loperamide, magnesium hydroxide, menthol-cetylpyridinium **OR** phenol, ondansetron **OR** ondansetron (ZOFRAN) IV, oxyCODONE, sodium chloride flush, SUMAtriptan, traZODone  Time spent: 35 minutes  Author: Val Riles. MD [facility]  07/06/2022 4:04 PM  To reach On-call, see care teams to locate the attending and reach out to them via [email or web address]. If 7PM-7AM, please contact night-coverage If you still have difficulty reaching the attending provider, please page the [facility] (Director on Call) for [facility] on amion for assistance.

## 2022-07-06 NOTE — Progress Notes (Signed)
    Attending Progress Note  History: Marie Mills is s/p left L3-4 on 06/15/22 presenting with numbness and clear wound drainage and new neurologic symptoms  POD5: Pt complaining of drainage POD4: Patient continues to have left leg pain that is largely unchanged from yesterday. POD3: continued leg pain. POD2: Patient states that she has continued pain and numbness on left. Drain put out 60 cc. She did stand yesterday. Cultures now showing E Coli and Klebsiella   HD3: continued leg pain and drainage from incision HD2: improved leg pain but continued numbness and back pain  Physical Exam: Vitals:   07/06/22 1216 07/06/22 1537  BP: 128/61 135/68  Pulse: 86 93  Resp: 18 20  Temp: 98.3 F (36.8 C) 98.1 F (36.7 C)  SpO2: 97% 98%    AA Ox3 CNI  Strength:5/5 throughout RLE. 4-/5 LLE, strength exam limited due to patient's pain and effort Incision covered with clean dressing. Clear drainage coming from drain site  Results: CRP 13.9 on 3/13 Wound cultures: E Coli and Klebsiella  Assessment/Plan:  Marie Mills 52 y.o. female who is s/p L L3/4 microdiscectomy (uncomplicated) who presents with numbness and now s/p wound infection washout. MRI on 3/18 showing improvement in throughout the lumbar fluid collection.    - mobilize - pain control - DVT prophylaxis - PTOT -  Antibiotics per ID - single stitch placed in drain site. No further drainage noted  Cooper Render PA-C Department of Neurosurgery

## 2022-07-06 NOTE — TOC Progression Note (Signed)
Transition of Care [facility]) - Progression Note    Patient Details  Name: Marie Mills MRN: [id number] Date of Birth: 31-Jan-1971  Transition of Care [facility]) CM/SW Contact  Ross Ludwig, [place] Phone Number: 07/06/2022, 6:05 PM  Clinical Narrative:     CSW was informed that patient is requesting hospital bed.  CSW contacted [facility] and spoke to [place], she confirmed that patient should qualify for hospital bed.  CSW completed narrative and asked physician to order hospital bed.  Order received, CSW explained to patient that once she discharges [facility] can coordinate delivery of hospital bed with her.  CSW asked how is she getting home, per patient, her daughter will pick her up and transport her home.  Patient will be receiving home IV antibiotics through [facility], per Carolynn Sayers, everything is set up for patient to discharge back home tomorrow with home IV antibiotics.  TOC to continue to facilitate discharge planning.   Expected Discharge Plan: OP Rehab Barriers to Discharge: Inadequate or no insurance  Expected Discharge Plan and Services   Discharge Planning Services: CM Consult   Living arrangements for the past 2 months: Single Family Home                 DME Arranged: N/A DME Agency: NA       HH Arranged: NA           Social Determinants of Health (SDOH) Interventions SDOH Screenings   Food Insecurity: No Food Insecurity (06/28/2022)  Housing: Low Risk  (06/28/2022)  Transportation Needs: No Transportation Needs (06/28/2022)  Utilities: Not At Risk (06/28/2022)  Alcohol Screen: Low Risk  (03/23/2022)  Depression (PHQ2-9): Low Risk  (03/05/2021)  Recent Concern: Depression (PHQ2-9) - Medium Risk (02/03/2021)  Financial Resource Strain: High Risk (03/23/2022)  Physical Activity: Inactive (03/23/2022)  Social Connections: Moderately Isolated (03/23/2022)  Stress: Stress Concern Present (03/23/2022)  Tobacco Use: Low Risk  (07/02/2022)    Readmission Risk  Interventions     No data to display

## 2022-07-06 NOTE — Progress Notes (Signed)
Occupational Therapy Treatment Patient Details Name: Marie Mills MRN: [id number] DOB: 1970-06-17 Today's Date: 07/06/2022   History of present illness Marie Mills is a 52 y.o. female with medical history significant for GERD, hypertension, asthma, lumbar radiculopathy and remote seizure disorder, who underwent L3-L4 left hemilaminotomy and microdiscectomy on 2/28 by Dr. Izora Ribas, who presented to the emergency room with acute onset of left groin and thigh pain and numbness.  She has been having mild drainage from her incision. MD assessment includes: postop seroma and possible lumbar discitis, wound infection s/p washout, diarrhea, metabolic acidosis, and hypokalemia.   OT comments  Patient received in semi-fowler's position in bed and agreeable to OT. She completed bed mobility with Mod I utilizing log roll technique, functional mobility to the bathroom with supervision and no AD, toilet transfer with supervision using grab bars, and sinkside grooming with supervision. Pt left as received with all needs in reach. Pt is making progress toward goal completion. D/C recommendation remains appropriate. OT will continue to follow acutely.    Recommendations for follow up therapy are one component of a multi-disciplinary discharge planning process, led by the attending physician.  Recommendations may be updated based on patient status, additional functional criteria and insurance authorization.    Follow Up Recommendations  Home health OT     Assistance Recommended at Discharge Intermittent Supervision/Assistance  Patient can return home with the following  A little help with walking and/or transfers;A little help with bathing/dressing/bathroom;Assistance with cooking/housework;Assist for transportation;Help with stairs or ramp for entrance   Equipment Recommendations  BSC/3in1 (bariatric BSC)    Recommendations for Other Services      Precautions / Restrictions Precautions Precautions:  Back;Fall Precaution Booklet Issued: No Restrictions Weight Bearing Restrictions: No Other Position/Activity Restrictions: No brace needed       Mobility Bed Mobility Overal bed mobility: Modified Independent Bed Mobility: Sidelying to Sit, Sit to Sidelying   Sidelying to sit: Modified independent (Device/Increase time)     Sit to sidelying: Modified independent (Device/Increase time) General bed mobility comments: use of log roll technique    Transfers Overall transfer level: Needs assistance Equipment used: None Transfers: Sit to/from Stand Sit to Stand: Supervision                 Balance Overall balance assessment: Needs assistance Sitting-balance support: Feet supported Sitting balance-Leahy Scale: Normal     Standing balance support: During functional activity, No upper extremity supported Standing balance-Leahy Scale: Good                             ADL either performed or assessed with clinical judgement   ADL Overall ADL's : Needs assistance/impaired     Grooming: Wash/dry hands;Supervision/safety;Standing                   Toilet Transfer: Regular Toilet;Grab bars;Supervision/safety;Ambulation Toilet Transfer Details (indicate cue type and reason): pt being extra cautious with lowering/rising from seat of toilet in order to keep back straight Toileting- Clothing Manipulation and Hygiene: Supervision/safety;Sit to/from stand       Functional mobility during ADLs: Supervision/safety (~24ft to the bathroom, no AD)      Extremity/Trunk Assessment Upper Extremity Assessment Upper Extremity Assessment: Overall WFL for tasks assessed   Lower Extremity Assessment Lower Extremity Assessment: Overall WFL for tasks assessed   Cervical / Trunk Assessment Cervical / Trunk Assessment: Back Surgery    Vision Baseline Vision/History: 1 Wears glasses Patient Visual Report:  No change from baseline     Perception     Praxis       Cognition Arousal/Alertness: Awake/alert Behavior During Therapy: WFL for tasks assessed/performed Overall Cognitive Status: Within Functional Limits for tasks assessed                                          Exercises Other Exercises Other Exercises: Education provided re: placing bariatric BSC frame over toilet at home to make toilet seat higher and provide handrails to push up from    Shoulder Instructions       General Comments      Pertinent Vitals/ Pain       Pain Assessment Pain Assessment: 0-10 Pain Score: 6  Pain Location: LLE Pain Descriptors / Indicators: Aching, Sore, Sharp, Shooting Pain Intervention(s): Monitored during session, Repositioned  Home Living                                          Prior Functioning/Environment              Frequency  Min 2X/week        Progress Toward Goals  OT Goals(current goals can now be found in the care plan section)  Progress towards OT goals: Progressing toward goals  Acute Rehab OT Goals Patient Stated Goal: reduce pain, return home OT Goal Formulation: With patient Time For Goal Achievement: 07/16/22 Potential to Achieve Goals: Good  Plan Discharge plan remains appropriate;Frequency remains appropriate    Co-evaluation                 AM-PAC OT "6 Clicks" Daily Activity     Outcome Measure   Help from another person eating meals?: None Help from another person taking care of personal grooming?: None Help from another person toileting, which includes using toliet, bedpan, or urinal?: A Little Help from another person bathing (including washing, rinsing, drying)?: A Little Help from another person to put on and taking off regular upper body clothing?: None Help from another person to put on and taking off regular lower body clothing?: A Little 6 Click Score: 21    End of Session    OT Visit Diagnosis: Other abnormalities of gait and mobility  (R26.89);Muscle weakness (generalized) (M62.81);Pain   Activity Tolerance Patient tolerated treatment well   Patient Left in bed;with call bell/phone within reach;with bed alarm set   Nurse Communication Mobility status        Time: [id number] OT Time Calculation (min): 13 min  Charges: OT General Charges $OT Visit: 1 Visit OT Treatments $Self Care/Home Management : 8-22 mins  [facility], OTR/L ascom [phone]  07/06/22, 6:00 PM

## 2022-07-07 DIAGNOSIS — M4646 Discitis, unspecified, lumbar region: Secondary | ICD-10-CM | POA: Diagnosis not present

## 2022-07-07 LAB — BASIC METABOLIC PANEL
Anion gap: 8 (ref 5–15)
BUN: 13 mg/dL (ref 6–20)
CO2: 26 mmol/L (ref 22–32)
Calcium: 8.6 mg/dL — ABNORMAL LOW (ref 8.9–10.3)
Chloride: 103 mmol/L (ref 98–111)
Creatinine, Ser: 0.6 mg/dL (ref 0.44–1.00)
GFR, Estimated: >60 mL/min (ref >60)
Glucose, Bld: 89 mg/dL (ref 70–99)
Potassium: 4 mmol/L (ref 3.5–5.1)
Sodium: 137 mmol/L (ref 135–145)

## 2022-07-07 LAB — CBC
HCT: 34.5 % — ABNORMAL LOW (ref 36.0–46.0)
Hemoglobin: 11 g/dL — ABNORMAL LOW (ref 12.0–15.0)
MCH: 27.8 pg (ref 26.0–34.0)
MCHC: 31.9 g/dL (ref 30.0–36.0)
MCV: 87.3 fL (ref 80.0–100.0)
Platelets: 299 10*3/uL (ref 150–400)
RBC: 3.95 MIL/uL (ref 3.87–5.11)
RDW: 14 % (ref 11.5–15.5)
WBC: 8.4 10*3/uL (ref 4.0–10.5)
nRBC: 0 % (ref 0.0–0.2)

## 2022-07-07 MED ORDER — OXYCODONE HCL 5 MG PO TABS: 5.0000 mg | ORAL_TABLET | Freq: Four times a day (QID) | ORAL | 0 refills | Status: AC | PRN

## 2022-07-07 MED ORDER — GABAPENTIN 300 MG PO CAPS: 300.0000 mg | ORAL_CAPSULE | Freq: Three times a day (TID) | ORAL | 5 refills | Status: DC

## 2022-07-07 MED ORDER — ACETAMINOPHEN 325 MG PO TABS: 650.0000 mg | ORAL_TABLET | Freq: Three times a day (TID) | ORAL | 0 refills | Status: AC | PRN

## 2022-07-07 MED ORDER — METHOCARBAMOL 750 MG PO TABS: 750.0000 mg | ORAL_TABLET | Freq: Four times a day (QID) | ORAL | 0 refills | Status: DC

## 2022-07-07 MED ORDER — LIDOCAINE HCL 1 % IJ SOLN
5.0000 mL | Freq: Once | INTRAMUSCULAR | Status: AC
  Administered 2022-07-07: 5 mL via INTRADERMAL
  Filled 2022-07-07: qty 5

## 2022-07-07 MED ORDER — MEROPENEM IV (FOR PTA / DISCHARGE USE ONLY): 2.0000 g | Freq: Three times a day (TID) | INTRAVENOUS | 0 refills | Status: AC

## 2022-07-07 NOTE — Progress Notes (Addendum)
    Attending Progress Note  History: Marie Mills is s/p left L3-4 on 06/15/22 presenting with numbness and clear wound drainage and new neurologic symptoms  POD6: persistent clear drainage from previous drain site.  Patient continues to have sharp pain into her groin area with movement POD5: Pt complaining of drainage POD4: Patient continues to have left leg pain that is largely unchanged from yesterday. POD3: continued leg pain. POD2: Patient states that she has continued pain and numbness on left. Drain put out 60 cc. She did stand yesterday. Cultures now showing E Coli and Klebsiella   HD3: continued leg pain and drainage from incision HD2: improved leg pain but continued numbness and back pain  Physical Exam: Vitals:   07/06/22 2334 07/07/22 0726  BP: 137/74 137/66  Pulse: 95 77  Resp: 18 16  Temp: 98 F (36.7 C) 98.3 F (36.8 C)  SpO2: 98% 93%    AA Ox3 CNI  Strength:5/5 throughout BLE with pain upon moving left lower extremity Incision covered with clean dressing. Clear drainage coming from drain site  Results: CRP 13.9 on 3/13 Wound cultures: E Coli and Klebsiella  Assessment/Plan:  Marie Mills 52 y.o. female who is s/p L L3/4 microdiscectomy (uncomplicated) who presents with numbness and now s/p wound infection washout. MRI on 3/18 showing improvement in throughout the lumbar fluid collection.    - mobilize - pain control - DVT prophylaxis - PTOT -  Antibiotics per ID -Running stitch added to drain site.  Cooper Render PA-C Department of Neurosurgery

## 2022-07-07 NOTE — Discharge Summary (Signed)
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   Specialty: Neurosurgery Why: As needed Contact information: [address]  [place] [address] [phone]                Diet recommendation: Cardiac diet  Activity: The patient is advised to gradually reintroduce usual activities, as tolerated  Discharge Condition: stable  Code Status: Full code   History of present illness: As per the H and P dictated on admission  Hospital Course:  Marie Mills is a 52 y.o. female with medical history significant for GERD, hypertension, asthma, lumbar radiculopathy and remote seizure disorder, who underwent L3-L4 left hemilaminotomy and microdiscectomy on 2/28 by Marie Mills, who presented to the emergency room with acute onset of left groin and thigh pain and numbness.  She has been having mild sinus drainage from her incision.  MRI lumbar spine reviewed The patient was given 10 mg of IV Decadron, 1 mg of IV Dilaudid, 4 mg of IV morphine sulfate and 4 mg of IV Zofran. Contact was made with Dr. Cari [place] who did not believe there was CSF leak but recommended admission for evaluation and management of her lumbar discitis and holding IV antibiotic therapy  pending IR guided biopsy. She will be admitted to a medical bed for further evaluation and management.    Assessment and Plan:   # Postop seroma and possible lumbar discitis This is mainly at L4-L5 level.  She is status post L3-L4 left hemilaminotomy and microdiscectomy, IV antibiotics were held off on admission as per ID and  Neurosurgery recommendation. Neurosurgery consulted, S/p Decadron 4 mg q6h d/cd on 3/13, continue as needed pain medications. S/p gabapentin 600 mg p.o. 3 times daily and Robaxin 750 mg every 8 hourly. ESR 16, CRP <0.5--13.9 Blood culture NGTD. WBC 8.8-13.9 elevated could be due to steroids 3/15 s/p washout was done by neurosurgery, Fluid cultures growing Klebsiella pneumonia, and Pseudomonas which are pansensitive and E. coli seems multidrug-resistant . S/p vancomycin, DC'd by ID.  Continue meropenem IV for 6 weeks end date 08/12/22, followed by Cipro and Bactrim.  S/p PICC line.  3/18 MRI lumbar spine repeated by neurosurgery, reviewed and documented improvement in the lumbar fluid collection.  No further surgical intervention.  IV antibiotics were arranged by ID, weekly labs needs to be done.  Patient was cleared to discharge home.  Hospital bed was ordered. # Diarrhea, developed after antibiotics. S/p probiotics twice daily and Imodium as needed.  Resolved. # Metabolic acidosis, unknown cause creatinine within normal range Bicarb 18--21--22 resolved s/p oral bicarbonate. # Hypokalemia, potassium repleted. Resolved # Hypophosphatemia, Phos repleted. Resolved # Asthma, chronic, stable continue her inhalers and  continue Singulair. # Lumbar radiculopathy, continue Neurontin home dose. # Essential hypertension,  Resumed Toprol-XL 25 mg p.o. daily and losartan 50 mg p.o. daily home dose # Migraine headache, continue Tylenol as needed, On 3/13 Imitrex 25 mg 1 dose given and as needed ordered  # Morbid obesity Body mass index is 44.12 kg/m.  Interventions: Lifestyle  modification advised, calorie diet and exercise advised to lose body weight    Pain control  - [place] Controlled Substance Reporting System database could not review as website was not working.   -Oxycodone prescribed as needed for 5 days.   - Patient was instructed, not to drive, operate heavy machinery, perform activities at heights, swimming or participation in water activities or provide baby sitting services while on Pain, Sleep and Anxiety Medications; until her outpatient Physician has advised to do so again.  - Also recommended to not to take more than prescribed Pain, Sleep and Anxiety Medications.  Patient was seen by physical therapy, who recommended Home health, which was arranged. On the day of the discharge the patient's vitals were stable, and no other acute medical condition were reported by patient. the patient was felt safe to be discharge at Home with Home health.  Consultants: Neurosurgery and ID Procedures: status post L3-L4 left hemilaminotomy and microdiscectomy,   Discharge Exam: General: Appear in no distress, no Rash; Oral Mucosa Clear, moist. Cardiovascular: S1 and S2 Present, no Murmur, Respiratory: normal respiratory effort, Bilateral Air entry present and no Crackles, no wheezes Abdomen: Bowel Sound present, Soft and no tenderness, no hernia Extremities: no Pedal edema, no calf tenderness Neurology: alert and oriented to time, place, and person affect appropriate.  Filed Weights   06/27/22 1842 07/01/22 0649  Weight: 124 kg 124 kg   Vitals:   07/07/22 0726 07/07/22 1516  BP: 137/66 (!) 146/87  Pulse: 77 100  Resp: 16 16  Temp: 98.3 F (36.8 C) 98.1 F (36.7 C)  SpO2: 93% 92%    DISCHARGE MEDICATION: Allergies as of 07/07/2022       Reactions   Penicillins Anaphylaxis   Has patient had a PCN reaction causing immediate rash, facial/tongue/throat swelling, SOB or lightheadedness with hypotension: Yes Has patient had a PCN reaction causing  severe rash involving mucus membranes or skin necrosis: No Has patient had a PCN reaction that required hospitalization Yes Has patient had a PCN reaction occurring within the last 10 years: No If all of the above answers are "NO", then may proceed with Cephalosporin use.   Codeine Other (See Comments)   Chest Pain   Sulfa Antibiotics Hives        Medication List     STOP taking these medications    amLODipine 2.5 MG tablet Commonly known as: NORVASC   clindamycin 150 MG capsule Commonly known as: Cleocin   magnesium gluconate 500 MG tablet Commonly known as: MAGONATE   Potassium 99 MG Tabs   pyridOXINE 100 MG tablet Commonly known as: VITAMIN B6   tiZANidine 4 MG tablet Commonly known as: ZANAFLEX       TAKE these medications    acetaminophen 325 MG tablet Commonly known as: TYLENOL Take 2 tablets (650 mg total) by mouth every 8 (eight) hours as needed for headache, fever, moderate pain or mild pain.   albuterol 108 (90 Base) MCG/ACT inhaler Commonly known as: VENTOLIN HFA Inhale 2 puffs into the lungs every 4 (four) hours as needed for wheezing or shortness of breath.   azelastine 0.1 % nasal spray Commonly known as: ASTELIN Place 2 sprays into both nostrils 2 (two)  times daily. Use in each nostril as directed What changed:  when to take this reasons to take this   budesonide 1 MG/2ML nebulizer solution Commonly known as: PULMICORT Take 1 mg by nebulization daily as needed.   cetirizine 10 MG tablet Commonly known as: ZYRTEC Take 1 tablet (10 mg total) by mouth daily.   clotrimazole-betamethasone cream Commonly known as: Lotrisone Apply 1 Application topically 2 (two) times daily. What changed:  when to take this reasons to take this   fluticasone 50 MCG/ACT nasal spray Commonly known as: FLONASE Place 1 spray into both nostrils daily as needed.   gabapentin 300 MG capsule Commonly known as: NEURONTIN Take 1 capsule (300 mg total) by mouth 3  (three) times daily.   ipratropium 0.06 % nasal spray Commonly known as: ATROVENT Place 2 sprays into both nostrils 4 (four) times daily as needed.   losartan 50 MG tablet Commonly known as: COZAAR Take 50 mg by mouth daily.   meropenem  IVPB Commonly known as: MERREM Inject 2 g into the vein every 8 (eight) hours. 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 Pneumoniae, and P aeruginosa First Dose: Yes Last Day of Therapy:  08/12/2022 Labs - Once weekly:  CBC/D, CMP, CRP and ESR Please pull PIC at completion of IV antibiotics Fax weekly lab results  promptly to [phone] Method of administration: Mini-Bag Plus / Gravity Method of administration may be changed at the discretion of home infusion pharmacist based upon assessment of the patient and/or caregiver's ability to self-administer the medication ordered.   methocarbamol 750 MG tablet Commonly known as: Robaxin-750 Take 1 tablet (750 mg total) by mouth 4 (four) times daily.   methylPREDNISolone 4 MG Tbpk tablet Commonly known as: MEDROL DOSEPAK Take 6 on day 1, 5 on day 2, 4 on day 3, 3 on day 4, 2 on day 5, 1 on day 6   metoprolol succinate 25 MG 24 hr tablet Commonly known as: TOPROL-XL Take 25 mg by mouth daily.   montelukast 10 MG tablet Commonly known as: SINGULAIR Take 10 mg by mouth daily.   nystatin cream Commonly known as: MYCOSTATIN Apply 1 Application topically 2 (two) times daily. What changed:  when to take this reasons to take this   omeprazole 40 MG capsule Commonly known as: PRILOSEC TK 1 C PO QD What changed:  how much to take how to take this when to take this additional instructions   oxyCODONE 5 MG immediate release tablet Commonly known as: Roxicodone Take 1 tablet (5 mg total) by mouth every 6 (six) hours as needed for up to 5 days for severe pain. What changed: when to take this   senna 8.6 MG Tabs tablet Commonly known as: SENOKOT Take 1 tablet (8.6 mg total) by  mouth daily as needed for mild constipation.   sulindac 150 MG tablet Commonly known as: CLINORIL TAKE 1 TABLET(150 MG) BY MOUTH TWICE DAILY   TRI-SPRINTEC PO Take 1 tablet by mouth daily.   valACYclovir 1000 MG tablet Commonly known as: Valtrex Take 1 tablet (1,000 mg total) by mouth 2 (two) times daily. What changed:  when to take this reasons to take this               Durable Medical Equipment  (From admission, onward)           Start     Ordered   07/06/22 1610  For home use only DME Hospital bed  Once  Question Answer Comment  Length of Need 6 Months   Bed type Semi-electric      07/06/22 1609   07/05/22 1429  For home use only DME Bedside commode  Once       Comments: Bariatric Due to body habitus needs Bariatric commode  Question:  Patient needs a bedside commode to treat with the following condition  Answer:  Impaired mobility   07/05/22 1429              Discharge Care Instructions  (From admission, onward)           Start     Ordered   07/07/22 0000  Change dressing on IV access line weekly and PRN  (Home infusion instructions - [facility] )        07/07/22 1049           Allergies  Allergen Reactions   Penicillins Anaphylaxis    Has patient had a PCN reaction causing immediate rash, facial/tongue/throat swelling, SOB or lightheadedness with hypotension: Yes Has patient had a PCN reaction causing severe rash involving mucus membranes or skin necrosis: No Has patient had a PCN reaction that required hospitalization Yes Has patient had a PCN reaction occurring within the last 10 years: No If all of the above answers are "NO", then may proceed with Cephalosporin use.    Codeine Other (See Comments)    Chest Pain   Sulfa Antibiotics Hives   Discharge Instructions     [facility] pharmacist to adjust dose for Vancomycin, Aminoglycosides and other anti-infective therapies as requested by physician.    Complete by: As directed    [facility] to provide Cath Flo 2mg    Complete by: As directed    Administer for PICC line occlusion and as ordered by physician for other access device issues.   Anaphylaxis Kit: Provided to treat any anaphylactic reaction to the medication being provided to the patient if First Dose or when requested by physician   Complete by: As directed    Epinephrine 1mg /ml vial / amp: Administer 0.3mg  (0.72ml) subcutaneously once for moderate to severe anaphylaxis, nurse to call physician and pharmacy when reaction occurs and call 911 if needed for immediate care   Diphenhydramine 50mg /ml IV vial: Administer 25-50mg  IV/IM PRN for first dose reaction, rash, itching, mild reaction, nurse to call physician and pharmacy when reaction occurs   Sodium Chloride 0.9% NS 576ml IV: Administer if needed for hypovolemic blood pressure drop or as ordered by physician after call to physician with anaphylactic reaction   Call MD for:  extreme fatigue   Complete by: As directed    Call MD for:  persistant dizziness or light-headedness   Complete by: As directed    Call MD for:  redness, tenderness, or signs of infection (pain, swelling, redness, odor or green/yellow discharge around incision site)   Complete by: As directed    Call MD for:  severe uncontrolled pain   Complete by: As directed    Call MD for:  temperature >100.4   Complete by: As directed    Change dressing on IV access line weekly and PRN   Complete by: As directed    Diet - low sodium heart healthy   Complete by: As directed    Discharge instructions   Complete by: As directed    Follow-up with PCP in 1 week Follow-up with ID in 1 to 2 weeks Follow-up with neurosurgery in 1 to 2 weeks  Flush IV access with Sodium Chloride 0.9% and Heparin 10 units/ml or 100 units/ml   Complete by: As directed    Home infusion instructions - [facility]   Complete by: As directed    Instructions: Flush IV  access with Sodium Chloride 0.9% and Heparin 10units/ml or 100units/ml   Change dressing on IV access line: Weekly and PRN   Instructions Cath Flo 2mg : Administer for PICC Line occlusion and as ordered by physician for other access device   [facility] pharmacist to adjust dose for: Vancomycin, Aminoglycosides and other anti-infective therapies as requested by physician   Incentive spirometry RT   Complete by: As directed    Increase activity slowly   Complete by: As directed    Method of administration may be changed at the discretion of home infusion pharmacist based upon assessment of the patient and/or caregiver's ability to self-administer the medication ordered   Complete by: As directed    No wound care   Complete by: As directed        The results of significant diagnostics from this hospitalization (including imaging, microbiology, ancillary and laboratory) are listed below for reference.    Significant Diagnostic Studies: [place] EKG SITE RITE  Result Date: 07/06/2022 If Site Rite image not attached, placement could not be confirmed due to current cardiac rhythm.  MR Lumbar Spine W Wo Contrast  Result Date: 07/04/2022 CLINICAL DATA:  Lumbar radiculopathy. EXAM: MRI LUMBAR SPINE WITHOUT AND WITH CONTRAST TECHNIQUE: Multiplanar and multiecho pulse sequences of the lumbar spine were obtained without and with intravenous contrast. CONTRAST:  36mL GADAVIST GADOBUTROL 1 MMOL/ML IV SOLN COMPARISON:  Prior MRI 06/27/2022 FINDINGS: Segmentation: There are five lumbar type vertebral bodies. The last full intervertebral disc space is labeled L5-S1. Alignment:  Normal Vertebrae: Normal marrow signal. No findings suspicious for discitis or osteomyelitis. Conus medullaris and cauda equina: Conus extends to the L1 level. Conus and cauda equina appear normal. Paraspinal and other soft tissues: No significant paraspinal or retroperitoneal findings. No prevertebral or psoas abscess. Expected  postoperative changes from prior lumbar laminectomy at L3-4 and recent wound irrigation procedure. Disc levels: L1-2: No significant findings. L2-3: Mild diffuse annular bulge appears stable. Flattening of the ventral thecal sac and mild bilateral lateral recess stenosis. Mild facet disease but no significant foraminal stenosis. L3-4: Prior right-sided laminectomy and new postoperative changes from wound debridement. There is a fluid collection in the laminectomy defect which appears to communicate with the left facet joint. Persistent diffuse bulging annulus with flattening of the ventral thecal sac and bilateral lateral recess stenosis. There is also mild bilateral foraminal stenosis which is stable. Moderate bilateral facet disease. L4-5: Persistent moderate-sized central disc protrusion in conjunction facet disease and ligamentum flavum thickening contributing to mild spinal and moderate bilateral lateral recess stenosis. No significant foraminal stenosis. L5-S1: Small central disc protrusion but no spinal, lateral recess or foraminal stenosis. Stable facet disease. IMPRESSION: 1. Expected postoperative changes at L3-4 with a fluid collection in the laminectomy defect which appears to communicate with the left facet joint. Persistent diffuse bulging annulus with flattening of the ventral thecal sac and bilateral lateral recess stenosis. There is also mild bilateral foraminal stenosis which is stable. 2. Persistent moderate-sized central disc protrusion at L4-5 in conjunction with facet disease and ligamentum flavum thickening contributing to mild to moderate spinal and moderate bilateral lateral recess stenosis. 3. Small central disc protrusion at L5-S1 but no spinal, lateral recess or foraminal stenosis. Electronically Signed  By: Marijo Sanes M.D.   On: 07/04/2022 16:40   MR Lumbar Spine W Wo Contrast  Result Date: 06/27/2022 CLINICAL DATA:  Postop, increasing pain, drainage from surgical site EXAM: MRI  LUMBAR SPINE WITHOUT AND WITH CONTRAST TECHNIQUE: Multiplanar and multiecho pulse sequences of the lumbar spine were obtained without and with intravenous contrast. CONTRAST:  37mL GADAVIST GADOBUTROL 1 MMOL/ML IV SOLN COMPARISON:  02/03/2022 FINDINGS: Segmentation:  Standard. Alignment: No listhesis. Minimal dextrocurvature. Straightening of the normal lumbar lordosis. Vertebrae: No acute fracture or suspicious osseous lesion. Status post L3-L4 left hemilaminotomy and microdiscectomy. Contrast enhancement is noted at the posterior aspect of the L4-L5 disc (series 10, images 8-10). No definite osseous enhancement. Conus medullaris and cauda equina: Conus extends to the L1 level. Conus and cauda equina appear normal. No abnormal nerve root enhancement. Along the dorsal aspect of the thecal sac, there is possible fluid collection that causes mass effect on the thecal sac, which follow CSF on most sequences and may represent a CSF leak (series 8, image 8). This extends along the dorsal aspect of the thecal sac from approximately T12-L1 through L3-L4. Paraspinal and other soft tissues: Postsurgical changes posterior to L3 and L4, with a fluid collection extending from the skin surface to the posterior, left, and anterior aspect of the thecal sac at the L3-L4 disc level (series 8, image 26), measuring approximately 8.3 x 1.8 x 2.1 cm (AP x TR x CC) (series 8, image 24 and series 5, image 10). Contrast enhancement is noted along this tract (series 10, image 10, as well as along the left aspect of the L3 and L4 spinous process (series 11, images 26-27). Increased T2 signal and contrast enhancement is also noted in the interspinous ligaments at L2-L3, L3-L4, and L4-L5 (series 7, image 9 and series 10, image 9) and in the bilateral L3-L4 facets (series 10, image 6 and 12 and series 7, image 6 and 12). Disc levels: T12-L1: No significant disc bulge. No spinal canal stenosis or neural foraminal narrowing. L1-L2: No significant  disc bulge. There is moderate thecal sac narrowing, secondary to a possible dorsal fluid collection (series 8, image 8). No neural foraminal narrowing. L2-L3: Mild disc bulge. Moderate thecal sac narrowing secondary to a dorsal fluid collection. No neural foraminal narrowing. L3-L4: Status post interval micro discectomy, with no residual disc protrusion/extrusion seen. Bilateral facet effusions. Fluid is noted extending along of the posterior, left, and anterior aspect of the thecal sac, contributing to moderate compression (series 8, image 25). No neural foraminal narrowing. L4-L5: Small central disc protrusion. Mild facet arthropathy. No spinal canal stenosis or neural foraminal narrowing. L5-S1: No significant disc bulge. No spinal canal stenosis or neural foraminal narrowing. IMPRESSION: 1. Status post L3-L4 left hemilaminotomy and microdiscectomy, with a fluid collection extending from the skin surface to the posterior and anterior aspect of the thecal sac at L3-L4, causing moderate thecal sac narrowing. The collection is associated with increased T2 signal and contrast enhancement and may represent a postoperative seroma, but an infected collection cannot be excluded. 2. Increased T2 signal and contrast enhancement in the interspinous ligaments at L2-L3, L3-L4, and L4-L5, as well as in the bilateral L3-L4 facets, concerning for infection, although this could also be post-surgical. 3. Contrast enhancement at the posterior aspect of the L4-L5 disc, concerning for discitis. 4. Possible epidural fluid collection along the dorsal aspect of the thecal sac from T12-L1 through L3-L4, possibly a CSF leak, which causes additional moderate thecal sac narrowing at L1-L2  and L2-L3. These results were called by telephone at the time of interpretation on 06/27/2022 at 11:53 pm to provider [facility] , who verbally acknowledged these results. Electronically Signed   By: Merilyn Baba M.D.   On: 06/27/2022 23:54   DG  Lumbar Spine 2-3 Views  Result Date: 06/15/2022 CLINICAL DATA:  L3-4 micro discectomy EXAM: LUMBAR SPINE - 2-3 VIEW COMPARISON:  MRI 02/03/2022 FINDINGS: C-arm images show surgical approach to the L3-4 disc level. Final images show a pro at the pedicle level of L4. IMPRESSION: L3-4 disc level/pedicle level of L4 localized. Electronically Signed   By: Nelson Chimes M.D.   On: 06/15/2022 09:27   DG C-Arm 1-60 Min-No Report  Result Date: 06/15/2022 Fluoroscopy was utilized by the requesting physician.  No radiographic interpretation.   DG C-Arm 1-60 Min-No Report  Result Date: 06/15/2022 Fluoroscopy was utilized by the requesting physician.  No radiographic interpretation.    Microbiology: Recent Results (from the past 240 hour(s))  Culture, blood (routine x 2)     Status: None   Collection Time: 06/28/22 12:56 AM   Specimen: BLOOD  Result Value Ref Range Status   Specimen Description BLOOD RIGHT HAND  Final   Special Requests   Final    BOTTLES DRAWN AEROBIC AND ANAEROBIC Blood Culture results may not be optimal due to an inadequate volume of blood received in culture bottles   Culture   Final    NO GROWTH 5 DAYS Performed at [facility], [address], [place], [place] [address]    Report Status 07/03/2022 FINAL  Final  Culture, blood (routine x 2)     Status: None   Collection Time: 06/28/22 12:56 AM   Specimen: BLOOD  Result Value Ref Range Status   Specimen Description BLOOD LEFT HAND  Final   Special Requests   Final    BOTTLES DRAWN AEROBIC AND ANAEROBIC Blood Culture adequate volume   Culture   Final    NO GROWTH 5 DAYS Performed at [facility], [address] [place], [place] [address]    Report Status 07/03/2022 FINAL  Final  Culture, blood (Routine X 2) w Reflex to ID Panel     Status: None   Collection Time: 06/29/22 11:22 AM   Specimen: BLOOD  Result Value Ref Range Status   Specimen Description   Final    BLOOD RIGHT ANTECUBITAL Performed  at [facility], [address], [place], [place] [address]    Special Requests   Final    BOTTLES DRAWN AEROBIC AND ANAEROBIC Blood Culture results may not be optimal due to an excessive volume of blood received in culture bottles Performed at [facility], [address], [place], [place] [address]    Culture   Final    NO GROWTH 5 DAYS Performed at [facility], [place] [address], [place], [place] [address]    Report Status 07/04/2022 FINAL  Final  Culture, blood (Routine X 2) w Reflex to ID Panel     Status: None   Collection Time: 06/29/22 11:22 AM   Specimen: BLOOD  Result Value Ref Range Status   Specimen Description   Final    BLOOD BLOOD LEFT HAND Performed at [facility], [address], [place], [place] [address]    Special Requests   Final    BOTTLES DRAWN AEROBIC ONLY Blood Culture adequate volume Performed at [facility], [address], [place], [place] [address]  Culture   Final    NO GROWTH 5 DAYS Performed at [facility], [place] [address] [place], [place] [address]    Report Status 07/04/2022 FINAL  Final  Aerobic Culture w Gram Stain (superficial specimen)     Status: None   Collection Time: 06/29/22 11:30 AM   Specimen: Wound  Result Value Ref Range Status   Specimen Description   Final    WOUND Performed at [facility], [address], [place], [place] [address]    Special Requests   Final    WOUND Performed at [facility], [place] [place], [place] [address]    Gram Stain   Final    RARE WBC PRESENT, PREDOMINANTLY PMN FEW GRAM NEGATIVE RODS Performed at [facility], [place] [address], [place], [place] [address]    Culture   Final    MODERATE KLEBSIELLA PNEUMONIAE FEW PSEUDOMONAS AERUGINOSA    Report Status 07/04/2022 FINAL  Final   Organism ID, Bacteria KLEBSIELLA PNEUMONIAE  Final   Organism ID, Bacteria PSEUDOMONAS AERUGINOSA  Final      Susceptibility    Klebsiella pneumoniae - MIC*    AMPICILLIN RESISTANT Resistant     CEFEPIME <=0.12 SENSITIVE Sensitive     CEFTAZIDIME <=1 SENSITIVE Sensitive     CEFTRIAXONE <=0.25 SENSITIVE Sensitive     CIPROFLOXACIN <=0.25 SENSITIVE Sensitive     GENTAMICIN <=1 SENSITIVE Sensitive     IMIPENEM <=0.25 SENSITIVE Sensitive     TRIMETH/SULFA <=20 SENSITIVE Sensitive     AMPICILLIN/SULBACTAM 4 SENSITIVE Sensitive     PIP/TAZO <=4 SENSITIVE Sensitive     * MODERATE KLEBSIELLA PNEUMONIAE   Pseudomonas aeruginosa - MIC*    CEFTAZIDIME 2 SENSITIVE Sensitive     CIPROFLOXACIN <=0.25 SENSITIVE Sensitive     GENTAMICIN <=1 SENSITIVE Sensitive     IMIPENEM 1 SENSITIVE Sensitive     PIP/TAZO <=4 SENSITIVE Sensitive     CEFEPIME 2 SENSITIVE Sensitive     * FEW PSEUDOMONAS AERUGINOSA  Body fluid culture w Gram Stain     Status: None   Collection Time: 07/01/22  8:05 AM   Specimen: PATH Other; Body Fluid  Result Value Ref Range Status   Specimen Description   Final    WOUND Performed at [facility], [address] [place], [place] [address]    Special Requests SUPERFICIAL LUMBAR WOUND  Final   Gram Stain   Final    FEW WBC PRESENT,BOTH PMN AND MONONUCLEAR RARE GRAM NEGATIVE RODS Performed at [facility], [place] [address], [place], [place] [address]    Culture   Final    FEW KLEBSIELLA PNEUMONIAE SUSCEPTIBILITIES PERFORMED ON PREVIOUS CULTURE WITHIN THE LAST 5 DAYS. FEW PSEUDOMONAS AERUGINOSA    Report Status 07/04/2022 FINAL  Final   Organism ID, Bacteria PSEUDOMONAS AERUGINOSA  Final      Susceptibility   Pseudomonas aeruginosa - MIC*    CEFTAZIDIME 2 SENSITIVE Sensitive     CIPROFLOXACIN <=0.25 SENSITIVE Sensitive     GENTAMICIN <=1 SENSITIVE Sensitive     IMIPENEM 1 SENSITIVE Sensitive     PIP/TAZO <=4 SENSITIVE Sensitive     CEFEPIME 2 SENSITIVE Sensitive     * FEW PSEUDOMONAS AERUGINOSA  Body fluid culture w Gram Stain     Status: None   Collection Time: 07/01/22  8:06 AM    Specimen: PATH Other; Body Fluid  Result Value Ref Range Status   Specimen Description   Final  WOUND Performed at [facility], [address], [place], [place] [address]    Special Requests   Final    DEEP LUMBAR WOUND Performed at [facility], [place] [address] [place], [place] [address]    Gram Stain   Final    RARE WBC PRESENT, PREDOMINANTLY PMN NO ORGANISMS SEEN Performed at [facility], [place] [address] [place], [place] [address]    Culture FEW KLEBSIELLA PNEUMONIAE  Final   Report Status 07/03/2022 FINAL  Final   Organism ID, Bacteria KLEBSIELLA PNEUMONIAE  Final      Susceptibility   Klebsiella pneumoniae - MIC*    AMPICILLIN >=32 RESISTANT Resistant     CEFEPIME <=0.12 SENSITIVE Sensitive     CEFTAZIDIME <=1 SENSITIVE Sensitive     CEFTRIAXONE <=0.25 SENSITIVE Sensitive     CIPROFLOXACIN <=0.25 SENSITIVE Sensitive     GENTAMICIN <=1 SENSITIVE Sensitive     IMIPENEM <=0.25 SENSITIVE Sensitive     TRIMETH/SULFA <=20 SENSITIVE Sensitive     AMPICILLIN/SULBACTAM 4 SENSITIVE Sensitive     PIP/TAZO <=4 SENSITIVE Sensitive     * FEW KLEBSIELLA PNEUMONIAE  Body fluid culture w Gram Stain     Status: None   Collection Time: 07/01/22  8:06 AM   Specimen: PATH Other; Body Fluid  Result Value Ref Range Status   Specimen Description   Final    WOUND Performed at [facility], [address], [place], [place] [address]    Special Requests   Final    EPIDURAL FLUID Performed at [facility], [place], [place] [address]    Gram Stain   Final    MODERATE WBC PRESENT,BOTH PMN AND MONONUCLEAR FEW GRAM VARIABLE ROD    Culture   Final    FEW ESCHERICHIA COLI Confirmed Extended Spectrum Beta-Lactamase Producer (ESBL).  In bloodstream infections from ESBL organisms, carbapenems are preferred over piperacillin/tazobactam. They are shown to have a lower risk of mortality. KLEBSIELLA PNEUMONIAE SUSCEPTIBILITIES  PERFORMED ON PREVIOUS CULTURE WITHIN THE LAST 5 DAYS. Performed at [facility], [place] [address] [place],  [address]    Report Status 07/04/2022 FINAL  Final   Organism ID, Bacteria ESCHERICHIA COLI  Final      Susceptibility   Escherichia coli - MIC*    AMPICILLIN >=32 RESISTANT Resistant     CEFEPIME >=32 RESISTANT Resistant     CEFTAZIDIME RESISTANT Resistant     CEFTRIAXONE >=64 RESISTANT Resistant     CIPROFLOXACIN >=4 RESISTANT Resistant     GENTAMICIN <=1 SENSITIVE Sensitive     IMIPENEM <=0.25 SENSITIVE Sensitive     TRIMETH/SULFA <=20 SENSITIVE Sensitive     AMPICILLIN/SULBACTAM 16 INTERMEDIATE Intermediate     PIP/TAZO <=4 SENSITIVE Sensitive     * FEW ESCHERICHIA COLI     Labs: CBC: Recent Labs  Lab 07/03/22 0421 07/04/22 0430 07/05/22 0554 07/06/22 0533 07/07/22 0700  WBC 11.3* 8.9 9.6 8.3 8.4  HGB 11.5* 11.2* 10.9* 10.6* 11.0*  HCT 36.0 34.6* 33.6* 32.9* 34.5*  MCV 87.2 86.5 86.8 87.5 87.3  PLT 321 288 321 310 [id number]   Basic Metabolic Panel: Recent Labs  Lab 07/02/22 0521 07/03/22 0421 07/04/22 0430 07/05/22 0554 07/06/22 0533 07/07/22 0700  NA 138 133* 135 134* 138 137  K 3.0* 3.4* 3.4* 3.6 3.7 4.0  CL 108 107 104 102 108 103  CO2 22 19* 22 23 25 26   GLUCOSE 97 91 90 99 83 89  BUN  15 17 11 12 14 13   CREATININE 0.69 0.62 0.58 0.61 0.58 0.60  CALCIUM 8.4* 8.0* 8.0* 8.4* 8.2* 8.6*  MG 2.0 2.0 2.1 2.4 2.5*  --   PHOS 3.4 2.3* 2.9 2.2* 3.2  --    Liver Function Tests: Recent Labs  Lab 07/06/22 0533  AST 36  ALT 51*  ALKPHOS 111  BILITOT 0.6  PROT 6.3*  ALBUMIN 3.0*   No results for input(s): "LIPASE", "AMYLASE" in the last 168 hours. No results for input(s): "AMMONIA" in the last 168 hours. Cardiac Enzymes: No results for input(s): "CKTOTAL", "CKMB", "CKMBINDEX", "TROPONINI" in the last 168 hours. BNP (last 3 results) No results for input(s): "BNP" in the last 8760 hours. CBG: No results for input(s): "GLUCAP" in the last 168  hours.  Time spent: 35 minutes  Signed:  Val Riles  [facility] 07/07/2022  5:23 PM

## 2022-07-07 NOTE — Plan of Care (Signed)

## 2022-07-07 NOTE — Progress Notes (Signed)
DISCHARGE NOTE:  Pt given discharge instructions, and verbalized understanding. Pts PICC clean dry and intact. . Surgical dressing clean dry and intact. Extra dressing supplies sent with pt. Pts BSC delivered to room, pt waiting on daughter for transportation home.

## 2022-07-11 ENCOUNTER — Ambulatory Visit (INDEPENDENT_AMBULATORY_CARE_PROVIDER_SITE_OTHER): Payer: Medicaid Other | Admitting: Orthopedic Surgery

## 2022-07-11 ENCOUNTER — Encounter: Payer: Self-pay | Admitting: Orthopedic Surgery

## 2022-07-11 VITALS — BP 144/84 | Temp 98.3°F | Ht 66.0 in | Wt 273.0 lb

## 2022-07-11 DIAGNOSIS — Z9889 Other specified postprocedural states: Secondary | ICD-10-CM

## 2022-07-11 DIAGNOSIS — T8149XA Infection following a procedure, other surgical site, initial encounter: Secondary | ICD-10-CM

## 2022-07-11 DIAGNOSIS — Z09 Encounter for follow-up examination after completed treatment for conditions other than malignant neoplasm: Secondary | ICD-10-CM

## 2022-07-11 NOTE — Telephone Encounter (Signed)
I spoke with the patient. She states it has slowed down some. She will come in at 3:30 today to see Marzetta Board.

## 2022-07-11 NOTE — Progress Notes (Signed)
   REFERRING PHYSICIAN:  Neale Burly, Md [place],  [place] [phone]  DOS: 07/01/22 Irrigation and debridement of lumbar wound with deep     debridement deep to the fascia 06/15/22 Left L3-L4 microdiscectomy  HISTORY OF PRESENT ILLNESS: Marie Mills is approximately 10 days status post above I&D. Cultures grew out E Coli and Klebsiella.   She was seeing ID in the hospital- was to continue IV meropenem for 6 weeks total (end date 08/12/22) followed by cipro and bactrim.   She was given medrol dose pack, oxycodone, robaxin on discharge.   She sent a MyChart message on Saturday 07/09/22 and was having some drainage. She was brought in today for evaluation.    She had a dressing on that was placed yesterday morning that has mild drainage on it.   She continues with LBP and left leg pain that is a little improved from prior to her surgery. She is taking prn oxycodone, robaxin. She continues on above antibiotics.    PHYSICAL EXAMINATION:  General: Patient is well developed, well nourished, calm, collected, and in no apparent distress.   NEUROLOGICAL:  General: In no acute distress.   Awake, alert, oriented to person, place, and time.  Pupils equal round and reactive to light.  Facial tone is symmetric.     Strength:          Side Iliopsoas Quads Hamstring PF DF EHL  R 5 5 5 5 5 5   L 5 5 5 5 5 5    Incision was initially dry, but as I pushed on it and she moved around the room, there was clear drops of drainage coming from drain site and multiple locations along the incision.   No redness noted. No gross fluctuance noted. No purulent drainage noted.    ROS (Neurologic):  Negative except as noted above  IMAGING: Nothing new to review.   ASSESSMENT/PLAN:  Marie Mills continues with drainage s/p above surgeries. Treatment options reviewed with Dr. Izora Ribas and following plan made:   - Patient brought over to [facility] at [facility] to likely have application  of wound vac done. Dr. Izora Ribas met [place] there and wound vac applied without complication.  - Continue on oxycodone and prn robaxin.  - Continue antibiotics per ID.  - She has follow up scheduled with me on Thursday 07/14/22.   Advised to contact the office if any questions or concerns arise.  Geronimo Boot PA-C Department of neurosurgery

## 2022-07-12 ENCOUNTER — Telehealth: Payer: Self-pay | Admitting: Neurosurgery

## 2022-07-12 NOTE — Telephone Encounter (Signed)
Pt called in stating that the same pain has returned from buttocks down her legs; sharp needle like pain.  She says it started last night and has continues thru today.

## 2022-07-13 ENCOUNTER — Other Ambulatory Visit: Payer: Self-pay | Admitting: Neurosurgery

## 2022-07-13 DIAGNOSIS — M48061 Spinal stenosis, lumbar region without neurogenic claudication: Secondary | ICD-10-CM

## 2022-07-13 MED ORDER — OXYCODONE HCL 5 MG PO CAPS: 5.0000 mg | ORAL_CAPSULE | Freq: Four times a day (QID) | ORAL | 0 refills | Status: DC | PRN

## 2022-07-13 MED ORDER — GABAPENTIN 300 MG PO CAPS: 600.0000 mg | ORAL_CAPSULE | Freq: Three times a day (TID) | ORAL | 0 refills | Status: DC

## 2022-07-13 MED ORDER — OXYCODONE HCL 5 MG PO TABS: 5.0000 mg | ORAL_TABLET | Freq: Four times a day (QID) | ORAL | 0 refills | Status: AC | PRN

## 2022-07-13 NOTE — Telephone Encounter (Signed)
I spoke with the patient. She is taking:  Tylenol: 650mg  every 8 hours Methocarbamol 750mg  - 4 times a day Oxycodone - 5mg  every 6 hours Gabapentin: 300mg  TID - she is asking can this be increased? She finished the medrol dosepack yesterday and is asking if she can have another one. I told her this may not be appropriate, but that I would ask.    [place]

## 2022-07-13 NOTE — Progress Notes (Unsigned)
   REFERRING PHYSICIAN:  Neale Burly, Md [place],  [place] [phone]  DOS: 07/01/22 Irrigation and debridement of lumbar wound with deep     debridement deep to the fascia 06/15/22 Left L3-L4 microdiscectomy  HISTORY OF PRESENT ILLNESS: Marie Mills is approximately 10 days status post above I&D. Cultures grew out E Coli and Klebsiella.   She was seeing ID in the hospital- was to continue IV meropenem for 6 weeks total (end date 08/12/22) followed by cipro and bactrim. She continues on prn robaxin and oxycodone.   She was seen on Monday 07/11/22 with continued drainage from her incision and drain site. Wound vac was applied and she is here for follow up.   She is doing well with wound vac. She has not seen any drainage. She continues with LBP and left leg pain. She is on prn oxycodone and robaxin. She is on neurontin. She continues on above antibiotics.    PHYSICAL EXAMINATION:  General: Patient is well developed, well nourished, calm, collected, and in no apparent distress.   NEUROLOGICAL:  General: In no acute distress.   Awake, alert, oriented to person, place, and time.  Pupils equal round and reactive to light.  Facial tone is symmetric.     Strength:          Side Iliopsoas Quads Hamstring PF DF EHL  R 5 5 5 5 5 5   L 5 5 5 5 5 5    Wound vac is over incision and is maintaining suction. There is visible erythema.   No drainage is in wound vac reservoir.     ROS (Neurologic):  Negative except as noted above  IMAGING: Nothing new to review.   ASSESSMENT/PLAN:  Marie Mills is doing reasonable. No drainage noted in wound vac reservoir. Treatment options reviewed with Dr. Izora Ribas and following plan made:   - Leave wound vac on until her follow up next week.  - If vac runs out of batteries, she can disconnect it, remove sponge, and place dry dressing over incision.  - Continue on oxycodone and prn robaxin. Continue neurontin.  - Continue antibiotics  per ID.  - Follow up with Danielle on Tuesday to remove wound vac and remove sutures.   Advised to contact the office if any questions or concerns arise.  Geronimo Boot PA-C Department of neurosurgery

## 2022-07-13 NOTE — Telephone Encounter (Signed)
Patient left voice message on 07/13/2022 at 4:44pm  She knows the office is closed but she is out of pain medication. She doesn't know what to do. She can't hardly move. [phone]

## 2022-07-13 NOTE — Telephone Encounter (Signed)
I notified the patient that Andee Poles refilled the oxycodone and increased her gabapentin to 600mg  TID. I advised her to watch for increased drowsiness and to notify us if she cannot tolerate the increased dosage. She will follow up with [place] as scheduled tomorrow.

## 2022-07-14 ENCOUNTER — Ambulatory Visit (INDEPENDENT_AMBULATORY_CARE_PROVIDER_SITE_OTHER): Payer: Medicaid Other | Admitting: Orthopedic Surgery

## 2022-07-14 ENCOUNTER — Encounter: Payer: Self-pay | Admitting: Orthopedic Surgery

## 2022-07-14 VITALS — BP 130/78 | HR 87 | Ht 66.0 in | Wt 273.0 lb

## 2022-07-14 DIAGNOSIS — Z09 Encounter for follow-up examination after completed treatment for conditions other than malignant neoplasm: Secondary | ICD-10-CM

## 2022-07-14 DIAGNOSIS — Z9889 Other specified postprocedural states: Secondary | ICD-10-CM

## 2022-07-14 DIAGNOSIS — T8149XD Infection following a procedure, other surgical site, subsequent encounter: Secondary | ICD-10-CM

## 2022-07-14 DIAGNOSIS — T8149XA Infection following a procedure, other surgical site, initial encounter: Secondary | ICD-10-CM

## 2022-07-14 DIAGNOSIS — M4646 Discitis, unspecified, lumbar region: Secondary | ICD-10-CM

## 2022-07-19 ENCOUNTER — Telehealth: Payer: Self-pay

## 2022-07-19 ENCOUNTER — Ambulatory Visit (INDEPENDENT_AMBULATORY_CARE_PROVIDER_SITE_OTHER): Payer: Medicaid Other | Admitting: Neurosurgery

## 2022-07-19 ENCOUNTER — Encounter: Payer: Self-pay | Admitting: Neurosurgery

## 2022-07-19 VITALS — BP 130/77 | Temp 98.7°F | Ht 66.0 in | Wt 273.0 lb

## 2022-07-19 DIAGNOSIS — M4646 Discitis, unspecified, lumbar region: Secondary | ICD-10-CM

## 2022-07-19 DIAGNOSIS — Z9889 Other specified postprocedural states: Secondary | ICD-10-CM

## 2022-07-19 DIAGNOSIS — Z09 Encounter for follow-up examination after completed treatment for conditions other than malignant neoplasm: Secondary | ICD-10-CM

## 2022-07-19 DIAGNOSIS — T8149XA Infection following a procedure, other surgical site, initial encounter: Secondary | ICD-10-CM

## 2022-07-19 MED ORDER — BACLOFEN 10 MG PO TABS: 10.0000 mg | ORAL_TABLET | Freq: Three times a day (TID) | ORAL | 0 refills | Status: AC

## 2022-07-19 MED ORDER — OXYCODONE HCL 5 MG PO TABS: 5.0000 mg | ORAL_TABLET | ORAL | 0 refills | Status: DC | PRN

## 2022-07-19 MED ORDER — CYCLOBENZAPRINE HCL 10 MG PO TABS: 10.0000 mg | ORAL_TABLET | Freq: Three times a day (TID) | ORAL | 0 refills | Status: DC | PRN

## 2022-07-19 NOTE — Telephone Encounter (Signed)
Marie Mills was seen in clinic this afternoon for wound vac removal and suture removal.   She sent a mychart message at 4:58pm with these pictures (see mychart message dated 06/23/22) and a note: "Still leaking as soon as I got home. "  I spoke with Marie Mills and Cooper Render, PA at length. At this time, we will plan to have the patient bandage the incision and come in tomorrow morning for a recheck. I have informed the patient about this. She will try to get here by 11am tomorrow, as we do not have a provider in office after that time. If she is not able to, I have asked her to notify us and to send an updated picture in the morning.

## 2022-07-19 NOTE — Progress Notes (Signed)
   REFERRING PHYSICIAN:  Meade Maw, [place],  [place] [id number]  DOS: 07/01/22 Irrigation and debridement of lumbar wound with deep debridement deep to the fascia 06/15/22 Left L3-L4 microdiscectomy    HISTORY OF PRESENT ILLNESS: Marie Mills is approximately 2.5 weeks status post wound washout. She presents today for removal of wound VAC and sutures.  She continues to have left radiating leg pain that is worse when she is sitting or lying down.  She is currently taking 600 mg of gabapentin 3 times daily, Robaxin, and Tylenol.  She takes Clinoril for her knee. she denies any drainage from her incision, fevers, or chills.  PHYSICAL EXAMINATION:  General: Patient is well developed, well nourished, calm, collected, and in no apparent distress.   NEUROLOGICAL:  General: In no acute distress.   Awake, alert, oriented to person, place, and time.  Pupils equal round and reactive to light.   Strength:            Side Iliopsoas Quads Hamstring PF DF EHL  R 5 5 5 5 5 5   L 5 5 5 5 5 5    Incision incision c/d/I with sutures in place   ROS (Neurologic):  Negative except as noted above  IMAGING: No interval imaging to review  ASSESSMENT/PLAN:  Marie Mills is approximately 2 weeks from wound washout.  Despite multiple medication she continues to have radiating leg pain concerning for sciatica.  We discussed that obtaining imaging at this time would be largely unhelpful and that she needs to undergo continued antibiotic treatment and clearance of her infection.  Would like to increase her gabapentin to 900 mg 3 times daily.  She will continue current prescription and let us know when she has run out at which time we will provide her with a new prescription at the increased dose.  I like to change her muscle relaxer from Robaxin to Baclofen to see if this gives her some added relief.  Should she fail to improve with these medication changes, we would  consider tapering her off of gabapentin and changing her to Lyrica.  she will follow up with Dr. Cari Caraway on 08/11/2022 or sooner should she have any questions or concerns.  Understanding was in agreement with this plan.  Advised to contact the office if any questions or concerns arise.  Cooper Render PA-C Department of neurosurgery

## 2022-07-20 ENCOUNTER — Ambulatory Visit (INDEPENDENT_AMBULATORY_CARE_PROVIDER_SITE_OTHER): Payer: Medicaid Other | Admitting: Neurosurgery

## 2022-07-20 ENCOUNTER — Encounter: Payer: Self-pay | Admitting: Neurosurgery

## 2022-07-20 VITALS — BP 130/82 | Temp 98.3°F | Wt 273.0 lb

## 2022-07-20 DIAGNOSIS — Z9889 Other specified postprocedural states: Secondary | ICD-10-CM

## 2022-07-20 DIAGNOSIS — T8149XD Infection following a procedure, other surgical site, subsequent encounter: Secondary | ICD-10-CM

## 2022-07-20 DIAGNOSIS — Z09 Encounter for follow-up examination after completed treatment for conditions other than malignant neoplasm: Secondary | ICD-10-CM

## 2022-07-20 DIAGNOSIS — M4646 Discitis, unspecified, lumbar region: Secondary | ICD-10-CM

## 2022-07-20 DIAGNOSIS — T8149XA Infection following a procedure, other surgical site, initial encounter: Secondary | ICD-10-CM

## 2022-07-20 NOTE — Progress Notes (Signed)
   REFERRING PHYSICIAN:  Neale Burly, Md [place],  [place] [id number]  DOS: 07/01/22 Irrigation and debridement of lumbar wound with deep     debridement deep to the fascia 06/15/22 Left L3-L4 microdiscectomy  HISTORY OF PRESENT ILLNESS: Marie Mills presents to clinic today for a persistently draining post-op incision. She was recently hospitalized for this and underwent wound revision on 07/01/2022.  Her wound cultures grew E. coli and Klebsiella and she was started on broad-spectrum antibiotics.  She continues on meropenem with end date of 08/12/2022. She was seen in clinic on 07/11/2022 for persistent drainage from her incision and ultimately a Prevena wound VAC was placed.  She was seen again in clinic on 07/14/2022 for a checkup and her wound VAC was in good position without any fluid in the collection chamber.  The decision was made to have her return to clinic on 07/19/2022 for removal of wound VAC and sutures. At her visit on 07/19/2022, her wound VAC was removed and her incision was clean and dry.  She was observed in the office for about 15 minutes without any drainage.  Her sutures were then removed.  There was no persistent drainage while she was in the office.  However, the patient called the office several hours after her appointment with clear drainage again from her incision has been persistent since that time and has saturated multiple dressings.  PHYSICAL EXAMINATION:  General: Patient is well developed, well nourished, calm, collected, and in no apparent distress.   NEUROLOGICAL:  General: In no acute distress.   Awake, alert, oriented to person, place, and time.  Pupils equal round and reactive to light.  Facial tone is symmetric.  Tongue protrusion is midline.  There is no pronator drift.   Strength:            Side Iliopsoas Quads Hamstring PF DF EHL  R 5 5 5 5 5 5   L 5 5 5 5 5 5    Incision with superficial dehiscence at the upper portion and obvious clear  drainage.  There is no signs of infection.   ROS (Neurologic):  Negative except as noted above  IMAGING: No interval imaging to review  ASSESSMENT/PLAN:  Marie Mills is approximately 2 and half weeks from wound debridement and closure with persistent drainage from her incision despite extensive localized wound care.  She has not had any fevers and continues on her broad-spectrum antibiotics.  After lengthy discussion with Dr. Johnney Killian, the decision was made to have the patient present to the [facility] ER this afternoon for further work up and plans for definitive closure. Dr. Dava Najjar will be taking over call for our patients and plans to see her and develop a definitive plan when she arrives at [facility].  The tentative plan is for coordinated wound closure with plastics.  The patient was instructed to contact the on-call provider for our office when she is on her way to [place] make them aware of her ETA.  Cooper Render PA-C Department of neurosurgery

## 2022-07-21 HISTORY — PX: OTHER SURGICAL HISTORY: SHX169

## 2022-07-25 ENCOUNTER — Telehealth: Payer: Self-pay | Admitting: Neurosurgery

## 2022-07-25 NOTE — Telephone Encounter (Signed)
LVM

## 2022-07-26 ENCOUNTER — Telehealth: Payer: Self-pay

## 2022-07-26 ENCOUNTER — Telehealth: Payer: Self-pay | Admitting: Neurosurgery

## 2022-07-26 NOTE — Telephone Encounter (Signed)
-----   Message from Rockey Situ sent at 07/26/2022  3:47 PM EDT ----- Regarding: RE: 4/18? I tried calling her and it went to voice mail. ----- Message ----- From: Sharlot Gowda, RN Sent: 07/26/2022   1:54 PM EDT To: Rockey Situ Subject: FW: 4/18?                                      She hasn't read my mychart message. Dr Jeannie Fend tried to call her at the end of the day yesterday, but she didn't answer. Can you follow up at end of day today if she doesn't read the message/respond? Thanks ----- Message ----- From: [name], MD Sent: 07/25/2022   4:11 PM EDT To: Sharlot Gowda, RN Subject: RE: 4/18?                                      Yes, and I will see her whenever she can come in on the afternoon.  We can move someone else  ----- Message ----- From: Sharlot Gowda, RN Sent: 07/25/2022   4:03 PM EDT To: [name], MD Subject: 4/18?                                          Given that Dr Emogene [name] said she still has a drain in and he is unsure when she will be discharged, do you still want to plan to see her on 4/18 for recheck/suture removal?  #also, she typically needs afternoon appointments due to transportation

## 2022-07-26 NOTE — Telephone Encounter (Signed)
Even though we haven't been able to reach her, I put her on the schedule for 4/18 at 1pm. We can try to reach her again in a couple of days, but I didn't want the appointment to get taken.

## 2022-07-26 NOTE — Telephone Encounter (Signed)
LVM

## 2022-07-28 ENCOUNTER — Encounter: Payer: Self-pay | Admitting: Neurosurgery

## 2022-07-28 NOTE — Telephone Encounter (Signed)
No answer

## 2022-07-31 ENCOUNTER — Emergency Department (HOSPITAL_COMMUNITY)
Admission: EM | Admit: 2022-07-31 | Discharge: 2022-07-31 | Disposition: A | Discharge status: Home / Self Care | Payer: Medicaid Other | Attending: Emergency Medicine | Admitting: Emergency Medicine

## 2022-07-31 ENCOUNTER — Other Ambulatory Visit: Payer: Self-pay

## 2022-07-31 ENCOUNTER — Encounter (HOSPITAL_COMMUNITY): Payer: Self-pay | Admitting: *Deleted

## 2022-07-31 DIAGNOSIS — Z789 Other specified health status: Secondary | ICD-10-CM

## 2022-07-31 DIAGNOSIS — Z452 Encounter for adjustment and management of vascular access device: Secondary | ICD-10-CM | POA: Insufficient documentation

## 2022-07-31 DIAGNOSIS — R000 Tachycardia, unspecified: Secondary | ICD-10-CM | POA: Insufficient documentation

## 2022-07-31 MED ORDER — SODIUM CHLORIDE 0.9 % IV SOLN
2.0000 g | Freq: Three times a day (TID) | INTRAVENOUS | Status: DC
  Administered 2022-07-31: 2 g via INTRAVENOUS
  Filled 2022-07-31 (×4): qty 40

## 2022-07-31 NOTE — Discharge Instructions (Signed)
Continue to use the antibiotics at home, please see your team at [facility] for any further issues

## 2022-07-31 NOTE — ED Triage Notes (Signed)
Pt with PICC line to right upper arm, states IV antibiotics is taking longer to infuse.

## 2022-07-31 NOTE — ED Provider Notes (Signed)
[facility] AT [facility] Provider Note   CSN: [id number] Arrival date & time: 07/31/22  1541     History  No chief complaint on file.   Marie Mills is a 52 y.o. female.  HPI   This patient is a 52 year old female, she has a history of a deep space infection of her back for which she is receiving IV antibiotics in the form of meropenem 2 g every 8 hours.  She had a PICC line placed in March and has been using this consistently in the right upper extremity.  The tubing was changed a couple of days ago and she has been doing well but noticed this morning that it was not flowing very well in fact over the course of about 6 hours she only got about half of a bag again.  She is concerned because it is not working right and the nature of her infection.  She has no other symptoms.  She was told to come to the ER by the [facility] team that has been treating her for this deep space infection.  Home Medications Prior to Admission medications   Medication Sig Start Date End Date Taking? Authorizing Provider  acetaminophen (TYLENOL) 325 MG tablet Take 2 tablets (650 mg total) by mouth every 8 (eight) hours as needed for headache, fever, moderate pain or mild pain. 07/07/22 08/06/22  Gillis Santa, MD  albuterol (VENTOLIN HFA) 108 (90 Base) MCG/ACT inhaler Inhale 2 puffs into the lungs every 4 (four) hours as needed for wheezing or shortness of breath. 05/13/22   Particia Nearing, PA-C  azelastine (ASTELIN) 0.1 % nasal spray Place 2 sprays into both nostrils 2 (two) times daily. Use in each nostril as directed Patient taking differently: Place 2 sprays into both nostrils 2 (two) times daily as needed. Use in each nostril as directed 08/09/21   Leath-Warren, Sadie Haber, NP  baclofen (LIORESAL) 10 MG tablet Take 1 tablet (10 mg total) by mouth 3 (three) times daily. 07/19/22 07/19/23  Susanne Borders, PA  budesonide (PULMICORT) 1 MG/2ML nebulizer solution Take 1 mg by  nebulization daily as needed. 09/08/21   [provider]  cetirizine (ZYRTEC) 10 MG tablet Take 1 tablet (10 mg total) by mouth daily. 08/09/21   Leath-Warren, Sadie Haber, NP  clotrimazole-betamethasone (LOTRISONE) cream Apply 1 Application topically 2 (two) times daily. Patient taking differently: Apply 1 Application topically 2 (two) times daily as needed. 05/30/22   Adline Potter, NP  fluticasone (FLONASE) 50 MCG/ACT nasal spray Place 1 spray into both nostrils daily as needed.    [provider]  gabapentin (NEURONTIN) 300 MG capsule Take 2 capsules (600 mg total) by mouth 3 (three) times daily. 07/13/22   Susanne Borders, PA  ipratropium (ATROVENT) 0.06 % nasal spray Place 2 sprays into both nostrils 4 (four) times daily as needed.    [provider]  losartan (COZAAR) 50 MG tablet Take 50 mg by mouth daily. 09/08/21   [provider]  meropenem (MERREM) IVPB Inject 2 g into the vein every 8 (eight) hours. 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 Pneumoniae, and P aeruginosa First Dose: Yes Last Day of Therapy:  08/12/2022 Labs - Once weekly:  CBC/D, CMP, CRP and ESR Please pull PIC at completion of IV antibiotics Fax weekly lab results  promptly to [phone] Method of administration: Mini-Bag Plus / Gravity Method of administration may be changed at the discretion of home  infusion pharmacist based upon assessment of the patient and/or caregiver's ability to self-administer the medication ordered. 07/07/22 08/13/22  Gillis Santa, MD  metoprolol succinate (TOPROL-XL) 25 MG 24 hr tablet Take 25 mg by mouth daily. 04/23/19   [provider]  montelukast (SINGULAIR) 10 MG tablet Take 10 mg by mouth daily. 05/03/21   [provider]  Norgestim-Eth Charlott Holler Triphasic (TRI-SPRINTEC PO) Take 1 tablet by mouth daily.    [provider]  nystatin cream (MYCOSTATIN) Apply 1 Application topically 2 (two) times  daily. Patient taking differently: Apply 1 Application topically 2 (two) times daily as needed for dry skin. 12/06/21   Adline Potter, NP  omeprazole (PRILOSEC) 40 MG capsule TK 1 C PO QD Patient taking differently: Take 40 mg by mouth daily. 03/04/19   Adline Potter, NP  oxyCODONE (OXY IR/ROXICODONE) 5 MG immediate release tablet Take 1-2 tablets (5-10 mg total) by mouth every 4 (four) hours as needed for severe pain. 07/19/22   Susanne Borders, PA  senna (SENOKOT) 8.6 MG TABS tablet Take 1 tablet (8.6 mg total) by mouth daily as needed for mild constipation. 06/15/22   Susanne Borders, PA  sulindac (CLINORIL) 150 MG tablet TAKE 1 TABLET(150 MG) BY MOUTH TWICE DAILY 06/23/22   Darreld Mclean, MD  valACYclovir (VALTREX) 1000 MG tablet Take 1 tablet (1,000 mg total) by mouth 2 (two) times daily. Patient taking differently: Take 1,000 mg by mouth 2 (two) times daily as needed. 12/14/21   Adline Potter, NP      Allergies    Penicillins, Codeine, and Sulfa antibiotics    Review of Systems   Review of Systems  All other systems reviewed and are negative.   Physical Exam Updated Vital Signs BP (!) 153/84   Pulse 98   Temp (!) 97.2 F (36.2 C)   Resp 20   Ht 1.676 m (5\' 6" )   Wt 122.5 kg   LMP 06/20/2022 (Exact Date)   SpO2 98%   BMI 43.58 kg/m  Physical Exam Vitals and nursing note reviewed.  Constitutional:      General: She is not in acute distress.    Appearance: She is well-developed.  HENT:     Head: Normocephalic and atraumatic.     Mouth/Throat:     Pharynx: No oropharyngeal exudate.  Eyes:     General: No scleral icterus.       Right eye: No discharge.        Left eye: No discharge.     Conjunctiva/sclera: Conjunctivae normal.     Pupils: Pupils are equal, round, and reactive to light.  Neck:     Thyroid: No thyromegaly.     Vascular: No JVD.  Cardiovascular:     Rate and Rhythm: Normal rate and regular rhythm.     Heart sounds: Normal heart  sounds. No murmur heard.    No friction rub. No gallop.  Pulmonary:     Effort: Pulmonary effort is normal. No respiratory distress.     Breath sounds: Normal breath sounds. No wheezing or rales.  Abdominal:     General: Bowel sounds are normal. There is no distension.     Palpations: Abdomen is soft. There is no mass.     Tenderness: There is no abdominal tenderness.  Musculoskeletal:        General: No tenderness. Normal range of motion.     Cervical back: Normal range of motion and neck supple.     Right  lower leg: No edema.     Left lower leg: No edema.  Lymphadenopathy:     Cervical: No cervical adenopathy.  Skin:    General: Skin is warm and dry.     Findings: No erythema or rash.  Neurological:     Mental Status: She is alert.     Coordination: Coordination normal.  Psychiatric:        Behavior: Behavior normal.     ED Results / Procedures / Treatments   Labs (all labs ordered are listed, but only abnormal results are displayed) Labs Reviewed - No data to display  EKG None  Radiology No results found.  Procedures Procedures    Medications Ordered in ED Medications  meropenem (MERREM) 2 g in sodium chloride 0.9 % 100 mL IVPB (0 g Intravenous Stopped 07/31/22 1924)    ED Course/ Medical Decision Making/ A&P                             Medical Decision Making  This patient is well-appearing, her vital signs show that she was mildly tachycardic on arrival but not tachycardic anymore.  She is not febrile, she has a normal blood pressure, I have inspected the IV site in the right upper extremity and there is no signs of redness or swelling or drainage around the catheter.  There is certainly some concern that the catheter has been in there for a couple of weeks and is currently having difficulty with flow, we will have the nurses evaluate, try to use for antibiotics but if it is not flowing we may need to refer for PICC line either here if available or at [facility]  tomorrow, she will get an IV dose of antibiotics at this time  Antibiotics given, PICC line used without difficulty, there was some kinking of the tubing that was fixed and it is now flowing very well.  Patient given reassurance, stable for discharge        Final Clinical Impression(s) / ED Diagnoses Final diagnoses:  Problem with vascular access    Rx / DC Orders ED Discharge Orders     None         Eber Hong, MD 07/31/22 1935

## 2022-08-01 NOTE — Telephone Encounter (Signed)
Left message on her voicemail

## 2022-08-02 NOTE — Telephone Encounter (Signed)
Left her a message on her voice mail to call the office to confirm or cancel her appt.

## 2022-08-04 ENCOUNTER — Encounter: Payer: Self-pay | Admitting: Infectious Diseases

## 2022-08-04 ENCOUNTER — Telehealth: Payer: Self-pay

## 2022-08-04 ENCOUNTER — Encounter: Payer: Medicaid Other | Admitting: Neurosurgery

## 2022-08-04 ENCOUNTER — Ambulatory Visit: Payer: Medicaid Other | Attending: Infectious Diseases | Admitting: Infectious Diseases

## 2022-08-04 VITALS — BP 151/74 | HR 101 | Temp 97.3°F | Ht 66.0 in | Wt 272.0 lb

## 2022-08-04 DIAGNOSIS — M4646 Discitis, unspecified, lumbar region: Secondary | ICD-10-CM

## 2022-08-04 DIAGNOSIS — G9782 Other postprocedural complications and disorders of nervous system: Secondary | ICD-10-CM | POA: Diagnosis not present

## 2022-08-04 DIAGNOSIS — G062 Extradural and subdural abscess, unspecified: Secondary | ICD-10-CM

## 2022-08-04 DIAGNOSIS — M4626 Osteomyelitis of vertebra, lumbar region: Secondary | ICD-10-CM | POA: Insufficient documentation

## 2022-08-04 DIAGNOSIS — G9600 Cerebrospinal fluid leak, unspecified: Secondary | ICD-10-CM | POA: Diagnosis not present

## 2022-08-04 NOTE — Telephone Encounter (Signed)
I have updated our pharmacy team and [facility] with updated OPAT order.  Per Dr. Rivka Safer IV abx extended until 08/31/22 and picc can be removed after end date. Luka Stohr T Pricilla Loveless

## 2022-08-04 NOTE — Progress Notes (Signed)
NAME: Marie Mills  DOB: 1970/08/01  MRN: [id number]  Date/Time: 08/04/2022 11:21 AM   Subjective:   ? Marie Mills is a 52 y.o.  was recently in the hospital between 06/27/2022 until 07/07/2022.  She is here for follow-up Patient has a history of lumbar radiculopathy for which she underwent hemilaminotomy and L3-L4 microdiscectomy on 06/15/2022.  Postoperative.  She was doing okay but she had intermittently increasing drainage from the surgical site and on 06/23/2022 she is sent a picture to the surgeon's office and was prescribed clindamycin by the PA.  On 06/27/2022 she went to the orthopedic surgeon for the knee pain and got a steroid shot on the right knee.  Then she came to the ED on 06/27/2022 because she was having acute onset of left lower back pain radiating down the left buttock with numbness over the buttock on the inner thigh and labia.  She did not have any fever.  Initially her ESR and CRP were normal.  An MRI showed a fluid collection extending from the skin surface to the posterior and anterior aspect of the thecal sac at L3-L4 causing moderate thecal sac narrowing.  This was associated with an increased T2 signal and contrast-enhancement.  There was a concern for L4-L5 disc infection and a possible epidural collection from T12 to L1-L3 and L4.  MRI study question to CSF leak. Superficial culture was taken and then as the ESR and CRP started to increase she was started on IV vancomycin and meropenem on 06/29/2022.  Patient was taken for washout on 07/01/2022 and multiple cultures were sent.  The superficial culture before the surgery grew Klebsiella and Pseudomonas..the epidural fluid culture grew ESBL E. coli and Klebsiella pneumonia.  Patient was discharged home on 07/07/2022 on IV meropenem with a PICC line.  She went to follow-up to Dr. Osborne Oman office on 07/19/2022 and sutures were removed.  When she went home she noticed there was some clear drainage.  She was sent to [facility].  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  She was seen by ID initially she was placed on vancomycin and meropenem.  She had multiple cultures taken all the deep cultures were negative.  1 superficial culture had 1 colony of Staph epidermidis which was considered to be a contaminant.  She also had lumbar drain for a few days. The vancomycin was discontinued and patient was sent home on meropenem on 07/29/2022.  She came to our ED on 07/31/2022 because of malfunctioning PICC line and which was okay.  She is here to see me.  She is finally doing a little better.  She still has numbness over the saddle area and inner thigh.  She does not have any fever or discharge from the surgical site which is healed now.  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   a.) properative PCR (+) prior to LEFT L3-4 MICRODISCECTOMY   Obesity    Pneumonia    Seizure    age 2-3; unknown cause nor if on medication    Past Surgical History:  Procedure Laterality Date   CHOLECYSTECTOMY  1987   COLONOSCOPY WITH PROPOFOL N/A 07/30/2021   Procedure: COLONOSCOPY WITH PROPOFOL;  Surgeon: Lanelle Bal, DO;  Location: [facility];  Service: Endoscopy;  Laterality: N/A;  2:45pm   I&D lumbar, CSF leak repair, lumbar drain  07/21/2022   Dr Emogene [name]   LUMBAR LAMINECTOMY/DECOMPRESSION MICRODISCECTOMY Left 06/15/2022   Procedure: LEFT L3-4 MICRODISCECTOMY;  Surgeon: [name], MD;  Location: [facility] ORS;  Service: Neurosurgery;  Laterality: Left;   LUMBAR WOUND DEBRIDEMENT N/A 07/01/2022   Procedure: LUMBAR WOUND DEBRIDEMENT;  Surgeon: [name], MD;  Location: [facility]  ORS;  Service: Neurosurgery;  Laterality: N/A;   POLYPECTOMY  07/30/2021   Procedure: POLYPECTOMY;  Surgeon: Lanelle Bal, DO;  Location: [facility];  Service: Endoscopy;;    Social History   Socioeconomic History   Marital status: Divorced    Spouse name: Not on file   Number of children: 3   Years of education: Not on file   Highest education level: Not on file  Occupational History   Not on file  Tobacco Use   Smoking status: Never   Smokeless tobacco: Never  Vaping Use   Vaping Use: Never used  Substance and Sexual Activity   Alcohol use: No   Drug use: No   Sexual activity: Not Currently    Birth control/protection: Pill  Other Topics Concern   Not on file  Social History Narrative   Lives with daughter   Social Determinants of Health   Financial Resource Strain: High Risk (03/23/2022)   Overall Financial Resource Strain (CARDIA)    Difficulty of Paying Living Expenses: Very hard  Food Insecurity: No Food Insecurity (06/28/2022)   Hunger Vital Sign    Worried About Running Out of Food in the Last Year: Never true    Ran Out of Food in the Last Year: Never true  Transportation Needs: No Transportation Needs (06/28/2022)   PRAPARE - Administrator, Civil Service (Medical): No    Lack of Transportation (Non-Medical): No  Physical Activity: Inactive (03/23/2022)   Exercise Vital Sign    Days of Exercise per Week: 0 days    Minutes of Exercise per Session: 0 min  Stress: Stress Concern Present (03/23/2022)   [employer] of Occupational Health - Occupational Stress Questionnaire    Feeling of Stress : Very much  Social Connections: Moderately Isolated (03/23/2022)   Social Connection and Isolation Panel [NHANES]    Frequency of Communication with Friends and Family: Once a week    Frequency of Social Gatherings with Friends and Family: Never    Attends Religious Services: More than 4 times per year    Active Member of [employer] or Organizations: Yes     Attends Banker Meetings: 1 to 4 times per year    Marital Status: Divorced  Intimate Partner Violence: Not At Risk (06/28/2022)   Humiliation, Afraid, Rape, and Kick questionnaire    Fear of Current or Ex-Partner: No    Emotionally Abused: No    Physically Abused: No    Sexually Abused: No    Family History  Problem Relation Age of Onset   Hypertension Mother    Hyperlipidemia Mother    Diabetes Mother    Hyperlipidemia Father    Hypertension Father    Diabetes Father    Seizures Father    Diabetes Sister    Hypertension Sister    Diabetes Sister    Hypertension Sister    Hypertension Sister  Diabetes Brother    Hypertension Brother    Asthma Daughter    Asthma Son    Cancer Maternal Aunt        breast   Colon cancer Neg Hx    Colon polyps Neg Hx    Allergies  Allergen Reactions   Penicillins Anaphylaxis    Has patient had a PCN reaction causing immediate rash, facial/tongue/throat swelling, SOB or lightheadedness with hypotension: Yes Has patient had a PCN reaction causing severe rash involving mucus membranes or skin necrosis: No Has patient had a PCN reaction that required hospitalization Yes Has patient had a PCN reaction occurring within the last 10 years: No If all of the above answers are "NO", then may proceed with Cephalosporin use.    Codeine Other (See Comments)    Chest Pain   Sulfa Antibiotics Hives   I? Current Outpatient Medications  Medication Sig Dispense Refill   acetaminophen (TYLENOL) 325 MG tablet Take 2 tablets (650 mg total) by mouth every 8 (eight) hours as needed for headache, fever, moderate pain or mild pain. 180 tablet 0   acetaZOLAMIDE (DIAMOX) 250 MG tablet Take 250 mg by mouth 2 (two) times daily.     albuterol (VENTOLIN HFA) 108 (90 Base) MCG/ACT inhaler Inhale 2 puffs into the lungs every 4 (four) hours as needed for wheezing or shortness of breath. 18 g 0   amLODipine (NORVASC) 2.5 MG tablet Take 2.5 mg by mouth  daily.     azelastine (ASTELIN) 0.1 % nasal spray Place 2 sprays into both nostrils 2 (two) times daily. Use in each nostril as directed (Patient taking differently: Place 2 sprays into both nostrils 2 (two) times daily as needed. Use in each nostril as directed) 30 mL 0   baclofen (LIORESAL) 10 MG tablet Take 1 tablet (10 mg total) by mouth 3 (three) times daily. 90 tablet 0   budesonide (PULMICORT) 1 MG/2ML nebulizer solution Take 1 mg by nebulization daily as needed.     butalbital-acetaminophen-caffeine (FIORICET) 50-325-40 MG tablet Take 1 tablet by mouth every 6 (six) hours as needed for Headache or Pain for up to 10 days     cetirizine (ZYRTEC) 10 MG tablet Take 1 tablet (10 mg total) by mouth daily. 30 tablet 0   clotrimazole-betamethasone (LOTRISONE) cream Apply 1 Application topically 2 (two) times daily. (Patient taking differently: Apply 1 Application topically 2 (two) times daily as needed.) 30 g 1   fluticasone (FLONASE) 50 MCG/ACT nasal spray Place 1 spray into both nostrils daily as needed.     gabapentin (NEURONTIN) 300 MG capsule Take 2 capsules (600 mg total) by mouth 3 (three) times daily. 90 capsule 0   ipratropium (ATROVENT) 0.06 % nasal spray Place 2 sprays into both nostrils 4 (four) times daily as needed.     losartan (COZAAR) 50 MG tablet Take 50 mg by mouth daily.     meropenem (MERREM) IVPB Inject 2 g into the vein every 8 (eight) hours. 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 Pneumoniae, and P aeruginosa First Dose: Yes Last Day of Therapy:  08/12/2022 Labs - Once weekly:  CBC/D, CMP, CRP and ESR Please pull PIC at completion of IV antibiotics Fax weekly lab results  promptly to [phone] Method of administration: Mini-Bag Plus / Gravity Method of administration may be changed at the discretion of home infusion pharmacist based upon assessment of the patient and/or caregiver's ability to self-administer the medication ordered. 111 Units 0  methocarbamol (ROBAXIN) 750 MG tablet Take by mouth 3 (three) times daily.     metoprolol succinate (TOPROL-XL) 25 MG 24 hr tablet Take 25 mg by mouth daily.     montelukast (SINGULAIR) 10 MG tablet Take 10 mg by mouth daily.     Norgestim-Eth Estrad Triphasic (TRI-SPRINTEC PO) Take 1 tablet by mouth daily.     nystatin cream (MYCOSTATIN) Apply 1 Application topically 2 (two) times daily. (Patient taking differently: Apply 1 Application topically 2 (two) times daily as needed for dry skin.) 30 g 3   omeprazole (PRILOSEC) 40 MG capsule TK 1 C PO QD (Patient taking differently: Take 40 mg by mouth daily.) 30 capsule 6   ondansetron (ZOFRAN-ODT) 4 MG disintegrating tablet Take 4 mg by mouth every 6 (six) hours as needed for nausea.     oxyCODONE (OXY IR/ROXICODONE) 5 MG immediate release tablet Take 1-2 tablets (5-10 mg total) by mouth every 4 (four) hours as needed for severe pain. 60 tablet 0   senna (SENOKOT) 8.6 MG TABS tablet Take 1 tablet (8.6 mg total) by mouth daily as needed for mild constipation. 120 tablet 0   sulindac (CLINORIL) 150 MG tablet TAKE 1 TABLET(150 MG) BY MOUTH TWICE DAILY 60 tablet 1   valACYclovir (VALTREX) 1000 MG tablet Take 1 tablet (1,000 mg total) by mouth 2 (two) times daily. (Patient taking differently: Take 1,000 mg by mouth 2 (two) times daily as needed.) 20 tablet 1   No current facility-administered medications for this visit.     Abtx:  Anti-infectives (From admission, onward)    None       REVIEW OF SYSTEMS:  Const: negative fever, negative chills, negative weight loss Eyes: negative diplopia or visual changes, negative eye pain ENT: negative coryza, negative sore throat Resp: negative cough, hemoptysis, dyspnea Cards: negative for chest pain, palpitations, lower extremity edema GU: negative for frequency, dysuria and hematuria GI: Negative for abdominal pain, diarrhea, bleeding, constipation Skin: negative for rash and pruritus Heme: negative for  easy bruising and gum/nose bleeding MS: As above Neurolo: As above   psych: anxiety  Endocrine: negative for thyroid, diabetes Allergy/Immunology-penicillin codeine and sulfa Objective:  VITALS:  BP (!) 151/74   Pulse (!) 101   Temp (!) 97.3 F (36.3 C) (Temporal)   Ht  (1.676 m)   Wt 272 lb (123.4 kg)   LMP 06/20/2022 (Exact Date)   BMI 43.90 kg/m   PHYSICAL EXAM:  General: Alert, cooperative, no distress, appears stated age.  Head: Normocephalic, without obvious abnormality, atraumatic. Eyes: Conjunctivae clear, anicteric sclerae. Pupils are equal ENT Nares normal. No drainage or sinus tenderness. Lips, mucosa, and tongue normal. No Thrush Neck: Supple, symmetrical, no adenopathy, thyroid: non tender no carotid bruit and no JVD. Back: Lumbar site surgical scar healthy and healed nicely with no erythema or discharge  Lungs: Clear to auscultation bilaterally. No Wheezing or Rhonchi. No rales. Heart: Regular rate and rhythm, no murmur, rub or gallop. Abdomen: Soft, non-tender,not distended. Bowel sounds normal. No masses Extremities: atraumatic, no cyanosis. No edema. No clubbing Skin: No rashes or lesions. Or bruising Lymph: Cervical, supraclavicular normal. Neurologic: Did not examine in detail.  Grossly nonfocal PICC site on the right arm is okay Pertinent Labs Reviewed labs done 2.  07/21/2022 3 cultures were all negative Superficial culture had a colony of Staph epidermidis which was deemed a contaminant   ? 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  Status post washout and culture which had multiple gram-negative organisms including Pseudomonas, Klebsiella, ESBL E. coli.  Was not discharged home on meropenem She had persistent drainage which was  diagnosed to be a CSF leak at [facility] on 07/21/2022 and she was taken back for another surgery to close the leak and further cultures. Repeat cultures were negative Patient has been released from [facility] on 07/29/2022. She will continue meropenem for 6 weeks from the surgery date of 07/21/2022. Until fifth 08/31/2022 and send patient is doing better now. Will follow labs Will see patient in 1 month as follow-up  Discussed management with the patient in detail.  Note:  This document was prepared using Dragon voice recognition software and may include unintentional dictation errors.

## 2022-08-04 NOTE — Telephone Encounter (Signed)
-----   Message from Lynn Ito, MD sent at 08/04/2022 11:30 AM EDT ----- Please let pam and Home health know that we are extending IV antibiotic as she had another surgery on 07/21/22 and now the end date for Meropenem 2 grams IV q 8 until 08/31/2022. Thx PICC can be pulled after that

## 2022-08-04 NOTE — Telephone Encounter (Signed)
Pt missed appt today. Left message on her # and on daughter's #. She has another appt with our office on 4/25 that was previously scheduled before she was admitted at [facility]. We can keep this, or we can work her in next Tuesday.

## 2022-08-04 NOTE — Patient Instructions (Addendum)
You are here forf ollow up of the lumbar spine infection- you went to [facility] and they closed the leak on 07/21/22- the cultures were good- you will continue meropenem 2 grams every 8 hours until 08/31/22

## 2022-08-08 NOTE — Telephone Encounter (Signed)
Cassie,Pharmacist with [employer] called office to follow up on opat orders. States that they had end date for 4/18. Relayed orders to Cassie to extend antibiotics through 5/15 and pull picc after last dose.  Orders repeated and confirmed before ending call. Juanita Laster, RMA

## 2022-08-11 ENCOUNTER — Encounter: Payer: Medicaid Other | Admitting: Neurosurgery

## 2022-08-25 ENCOUNTER — Other Ambulatory Visit: Payer: Self-pay | Admitting: Orthopaedic Surgery

## 2022-08-30 ENCOUNTER — Telehealth: Payer: Self-pay

## 2022-08-30 ENCOUNTER — Encounter: Payer: Self-pay | Admitting: Neurosurgery

## 2022-08-30 NOTE — Telephone Encounter (Signed)
Marie Mills did not show for her post op appointment with Manning Charity, PA-C on 08/30/22. All attempts to reach her since 07/25/22 have been unsuccessful.

## 2022-09-01 ENCOUNTER — Telehealth: Payer: Self-pay

## 2022-09-01 ENCOUNTER — Ambulatory Visit: Payer: Medicaid Other | Attending: Infectious Diseases | Admitting: Infectious Diseases

## 2022-09-01 ENCOUNTER — Encounter: Payer: Self-pay | Admitting: Infectious Diseases

## 2022-09-01 VITALS — BP 131/82 | HR 85 | Temp 96.6°F | Ht 66.0 in | Wt 283.0 lb

## 2022-09-01 DIAGNOSIS — M5416 Radiculopathy, lumbar region: Secondary | ICD-10-CM | POA: Insufficient documentation

## 2022-09-01 DIAGNOSIS — G061 Intraspinal abscess and granuloma: Secondary | ICD-10-CM | POA: Insufficient documentation

## 2022-09-01 DIAGNOSIS — M4646 Discitis, unspecified, lumbar region: Secondary | ICD-10-CM | POA: Insufficient documentation

## 2022-09-01 NOTE — Telephone Encounter (Signed)
Patient's picc line removed today in office by Dr. Rivka Safer. I have updated [employer] of the picc line removal. Kamiah Fite Jonathon Resides, CMA

## 2022-09-01 NOTE — Patient Instructions (Signed)
You are here for follow up of lumbar spine infection- you have completed 9 weeks of Iv antibiotic- ESR/CRP normal- the surgical site has healed completely- you dont need any more antibiotics- PICC line removed today

## 2022-09-01 NOTE — Progress Notes (Signed)
NAME: Marie Mills  DOB: March 03, 1971  MRN: [id number]  Date/Time: 09/01/2022 9:24 AM   Subjective:   ?follow up visit, last seen a month ago Here with her partner Marie Mills is a 52 y.o.  has a history of lumbar radiculopathy for which she underwent hemilaminotomy and L3-L4 microdiscectomy on 06/15/2022.   she had intermittently increasing drainage from the surgical site post operative period and on 06/23/2022 she is sent a picture to the surgeon's office and was prescribed clindamycin by the PA.  On 06/27/2022 she went to the orthopedic surgeon for the knee pain and got a steroid shot on the right knee.  Then she came to the ED on 06/27/2022 because she was having acute onset of left lower back pain radiating down the left buttock with numbness over the buttock on the inner thigh and labia.  She did not have any fever.  Initially her ESR and CRP were normal.  An MRI showed a fluid collection extending from the skin surface to the posterior and anterior aspect of the thecal sac at L3-L4 causing moderate thecal sac narrowing.  This was associated with an increased T2 signal and contrast-enhancement.  There was a concern for L4-L5 disc infection and a possible epidural collection from T12 to L1-L3 and L4.  MRI study questioned CSF leak. Superficial culture was taken and then as the ESR and CRP started to increase she was started on IV vancomycin and meropenem on 06/29/2022.  Patient was taken for washout on 07/01/2022 and multiple cultures were sent.  The superficial culture before the surgery grew Klebsiella and Pseudomonas..the epidural fluid culture grew ESBL E. coli and Klebsiella pneumonia.  Patient was discharged home on 07/07/2022 on IV meropenem with a PICC line.  She went to follow-up to Dr. Osborne Oman office on 07/19/2022 and sutures were removed.  When she went home she noticed there was some clear drainage.  She was sent to [facility].  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  She  was seen by ID initially she was placed on vancomycin and meropenem.  She had multiple cultures taken all the deep cultures were negative.  1 superficial culture had 1 colony of Staph epidermidis which was considered to be a contaminant.  She also had lumbar drain for a few days. The vancomycin was discontinued and patient was sent home on meropenem on 07/29/2022.  She came to our ED on 07/31/2022 because of malfunctioning PICC line and which was okay.  She is here to see me.  She is finally doing a little better.  She still has numbness over the saddle area and inner thigh.  She does not have any fever or discharge from the surgical site which is healed now.  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   a.) properative PCR (+) prior to LEFT L3-4 MICRODISCECTOMY   Obesity    Pneumonia    Seizure [facility])    age 65-3; unknown cause  nor if on medication    Past Surgical History:  Procedure Laterality Date   CHOLECYSTECTOMY  1987   COLONOSCOPY WITH PROPOFOL N/A 07/30/2021   Procedure: COLONOSCOPY WITH PROPOFOL;  Surgeon: Lanelle Bal, DO;  Location: [facility];  Service: Endoscopy;  Laterality: N/A;  2:45pm   I&D lumbar, CSF leak repair, lumbar drain  07/21/2022   Dr Emogene [name]   LUMBAR LAMINECTOMY/DECOMPRESSION MICRODISCECTOMY Left 06/15/2022   Procedure: LEFT L3-4 MICRODISCECTOMY;  Surgeon: [name], MD;  Location: [facility] ORS;  Service: Neurosurgery;  Laterality: Left;   LUMBAR WOUND DEBRIDEMENT N/A  07/01/2022   Procedure: LUMBAR WOUND DEBRIDEMENT;  Surgeon: [name], MD;  Location: [facility] ORS;  Service: Neurosurgery;  Laterality: N/A;   POLYPECTOMY  07/30/2021   Procedure: POLYPECTOMY;  Surgeon: Lanelle Bal, DO;  Location: [facility];  Service: Endoscopy;;    Social History   Socioeconomic History   Marital status: Divorced    Spouse name: Not on file   Number of children: 3   Years of education: Not on file   Highest education level: Not on file  Occupational History   Not on file  Tobacco Use   Smoking status: Never   Smokeless tobacco: Never  Vaping Use   Vaping Use: Never used  Substance and Sexual Activity   Alcohol use: No   Drug use: No   Sexual activity: Not Currently    Birth control/protection: Pill  Other Topics Concern   Not on file  Social History Narrative   Lives with daughter   Social Determinants of Health   Financial Resource Strain: High Risk (03/23/2022)   Overall Financial Resource Strain (CARDIA)    Difficulty of Paying Living Expenses: Very hard  Food Insecurity: No Food Insecurity (06/28/2022)   Hunger Vital Sign    Worried About Running Out of Food in the Last Year: Never true    Ran Out of Food in the Last Year: Never true  Transportation Needs: No Transportation Needs (06/28/2022)   PRAPARE - Administrator, Civil Service (Medical): No    Lack of Transportation (Non-Medical): No  Physical Activity: Inactive (03/23/2022)   Exercise Vital Sign    Days of Exercise per Week: 0 days    Minutes of Exercise per Session: 0 min  Stress: Stress Concern Present (03/23/2022)   [employer] of Occupational Health - Occupational Stress Questionnaire    Feeling of Stress : Very much  Social Connections: Moderately Isolated (03/23/2022)   Social Connection and Isolation Panel [NHANES]    Frequency of Communication with Friends and Family: Once a week    Frequency of Social Gatherings with Friends and Family: Never     Attends Religious Services: More than 4 times per year    Active Member of [employer] or Organizations: Yes    Attends Banker Meetings: 1 to 4 times per year    Marital Status: Divorced  Intimate Partner Violence: Not At Risk (06/28/2022)   Humiliation, Afraid, Rape, and Kick questionnaire    Fear of Current or Ex-Partner: No    Emotionally Abused: No    Physically Abused: No    Sexually Abused: No    Family History  Problem Relation Age of Onset   Hypertension Mother    Hyperlipidemia Mother    Diabetes Mother    Hyperlipidemia Father    Hypertension Father    Diabetes Father    Seizures Father    Diabetes Sister    Hypertension Sister  Diabetes Sister    Hypertension Sister    Hypertension Sister    Diabetes Brother    Hypertension Brother    Asthma Daughter    Asthma Son    Cancer Maternal Aunt        breast   Colon cancer Neg Hx    Colon polyps Neg Hx    Allergies  Allergen Reactions   Penicillins Anaphylaxis    Has patient had a PCN reaction causing immediate rash, facial/tongue/throat swelling, SOB or lightheadedness with hypotension: Yes Has patient had a PCN reaction causing severe rash involving mucus membranes or skin necrosis: No Has patient had a PCN reaction that required hospitalization Yes Has patient had a PCN reaction occurring within the last 10 years: No If all of the above answers are "NO", then may proceed with Cephalosporin use.    Codeine Other (See Comments)    Chest Pain   Sulfa Antibiotics Hives   I? Current Outpatient Medications  Medication Sig Dispense Refill   albuterol (VENTOLIN HFA) 108 (90 Base) MCG/ACT inhaler Inhale 2 puffs into the lungs every 4 (four) hours as needed for wheezing or shortness of breath. 18 g 0   amLODipine (NORVASC) 2.5 MG tablet Take 2.5 mg by mouth daily.     azelastine (ASTELIN) 0.1 % nasal spray Place 2 sprays into both nostrils 2 (two) times daily. Use in each nostril as directed (Patient taking  differently: Place 2 sprays into both nostrils 2 (two) times daily as needed. Use in each nostril as directed) 30 mL 0   baclofen (LIORESAL) 10 MG tablet Take 1 tablet (10 mg total) by mouth 3 (three) times daily. 90 tablet 0   budesonide (PULMICORT) 1 MG/2ML nebulizer solution Take 1 mg by nebulization daily as needed.     cetirizine (ZYRTEC) 10 MG tablet Take 1 tablet (10 mg total) by mouth daily. 30 tablet 0   clotrimazole-betamethasone (LOTRISONE) cream Apply 1 Application topically 2 (two) times daily. (Patient taking differently: Apply 1 Application topically 2 (two) times daily as needed.) 30 g 1   fluticasone (FLONASE) 50 MCG/ACT nasal spray Place 1 spray into both nostrils daily as needed.     gabapentin (NEURONTIN) 300 MG capsule Take 2 capsules (600 mg total) by mouth 3 (three) times daily. 90 capsule 0   ipratropium (ATROVENT) 0.06 % nasal spray Place 2 sprays into both nostrils 4 (four) times daily as needed.     losartan (COZAAR) 50 MG tablet Take 50 mg by mouth daily.     metoprolol succinate (TOPROL-XL) 25 MG 24 hr tablet Take 25 mg by mouth daily.     montelukast (SINGULAIR) 10 MG tablet Take 10 mg by mouth daily.     Norgestim-Eth Estrad Triphasic (TRI-SPRINTEC PO) Take 1 tablet by mouth daily.     nystatin cream (MYCOSTATIN) Apply 1 Application topically 2 (two) times daily. (Patient taking differently: Apply 1 Application topically 2 (two) times daily as needed for dry skin.) 30 g 3   omeprazole (PRILOSEC) 40 MG capsule TK 1 C PO QD (Patient taking differently: Take 40 mg by mouth daily.) 30 capsule 6   oxyCODONE (OXY IR/ROXICODONE) 5 MG immediate release tablet Take 1-2 tablets (5-10 mg total) by mouth every 4 (four) hours as needed for severe pain. 60 tablet 0   senna (SENOKOT) 8.6 MG TABS tablet Take 1 tablet (8.6 mg total) by mouth daily as needed for mild constipation. 120 tablet 0   sulindac (CLINORIL) 150 MG tablet TAKE 1  TABLET(150 MG) BY MOUTH TWICE DAILY 60 tablet 1    valACYclovir (VALTREX) 1000 MG tablet Take 1 tablet (1,000 mg total) by mouth 2 (two) times daily. (Patient taking differently: Take 1,000 mg by mouth 2 (two) times daily as needed.) 20 tablet 1   No current facility-administered medications for this visit.     Abtx:  Anti-infectives (From admission, onward)    None       REVIEW OF SYSTEMS:  Const: negative fever, negative chills, negative weight loss Eyes: negative diplopia or visual changes, negative eye pain ENT: negative coryza, negative sore throat Resp: negative cough, hemoptysis, dyspnea Cards: negative for chest pain, palpitations, lower extremity edema GU: negative for frequency, dysuria and hematuria GI: Negative for abdominal pain, diarrhea, bleeding, constipation Skin: negative for rash and pruritus Heme: negative for easy bruising and gum/nose bleeding MS: As above Neurolo: As above   psych: anxiety  Endocrine: negative for thyroid, diabetes Allergy/Immunology-penicillin codeine and sulfa Objective:  VITALS:  BP 131/82   Pulse 85   Temp (!) 96.6 F (35.9 C) (Temporal)   Ht 5\' 6"  (1.676 m)   Wt 283 lb (128.4 kg)   BMI 45.68 kg/m   PHYSICAL EXAM:  General: Alert, cooperative, no distress, appears stated age.  Head: Normocephalic, without obvious abnormality, atraumatic. Eyes: Conjunctivae clear, anicteric sclerae. Pupils are equal ENT Nares normal. No drainage or sinus tenderness. Lips, mucosa, and tongue normal. No Thrush Neck: Supple, symmetrical, no adenopathy, thyroid: non tender no carotid bruit and no JVD. Back: Lumbar site surgical scar healthy and healed completely Lungs: Clear to auscultation bilaterally. No Wheezing or Rhonchi. No rales. Heart: Regular rate and rhythm, no murmur, rub or gallop. Abdomen: Soft, non-tender,not distended. Bowel sounds normal. No masses Extremities: atraumatic, no cyanosis. No edema. No clubbing Skin: No rashes or lesions. Or bruising Lymph: Cervical,  supraclavicular normal. Neurologic: Did not examine in detail.  Grossly nonfocal PICC site on the right arm is okay  Pertinent Labs Reviewed labs done 2.  07/21/2022 3 cultures were all negative Superficial culture had a colony of Staph epidermidis which was deemed a contaminant   ? 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  Status post washout and culture which had multiple gram-negative organisms including Pseudomonas, Klebsiella, ESBL E. coli.  Was not discharged home on meropenem She had persistent drainage which was diagnosed to be a CSF leak at [facility] on 07/21/2022 and she was taken back for another surgery to close the leak and further cultures. Repeat cultures were negative Patient was discharged from [facility] on 07/29/2022. She has continued meropenem for another 6 weeks from the surgery date of 07/21/2022. Until 08/31/2022 - last ESR/CRP 08/29/22 N  Removed PICC today   No further antibiotic needed Follow PRN Follow up with neurosurgery at [facility]  Discussed management with the patient and her partner in detail.  Note:  This document was prepared using Dragon voice recognition software and may include unintentional dictation errors.

## 2022-09-08 ENCOUNTER — Emergency Department (HOSPITAL_COMMUNITY): Payer: Medicaid Other

## 2022-09-08 ENCOUNTER — Other Ambulatory Visit: Payer: Self-pay

## 2022-09-08 ENCOUNTER — Emergency Department (HOSPITAL_COMMUNITY)
Admission: EM | Admit: 2022-09-08 | Discharge: 2022-09-08 | Disposition: A | Discharge status: Home / Self Care | Payer: Medicaid Other | Attending: Emergency Medicine | Admitting: Emergency Medicine

## 2022-09-08 ENCOUNTER — Encounter (HOSPITAL_COMMUNITY): Payer: Self-pay | Admitting: Emergency Medicine

## 2022-09-08 DIAGNOSIS — M7989 Other specified soft tissue disorders: Secondary | ICD-10-CM | POA: Diagnosis present

## 2022-09-08 DIAGNOSIS — R600 Localized edema: Secondary | ICD-10-CM | POA: Insufficient documentation

## 2022-09-08 LAB — CBC WITH DIFFERENTIAL/PLATELET
Abs Immature Granulocytes: 0.05 10*3/uL (ref 0.00–0.07)
Basophils Absolute: 0.1 10*3/uL (ref 0.0–0.1)
Basophils Relative: 1 %
Eosinophils Absolute: 0.4 10*3/uL (ref 0.0–0.5)
Eosinophils Relative: 5 %
HCT: 32.7 % — ABNORMAL LOW (ref 36.0–46.0)
Hemoglobin: 10.3 g/dL — ABNORMAL LOW (ref 12.0–15.0)
Immature Granulocytes: 1 %
Lymphocytes Relative: 30 %
Lymphs Abs: 2.2 10*3/uL (ref 0.7–4.0)
MCH: 25.4 pg — ABNORMAL LOW (ref 26.0–34.0)
MCHC: 31.5 g/dL (ref 30.0–36.0)
MCV: 80.7 fL (ref 80.0–100.0)
Monocytes Absolute: 0.6 10*3/uL (ref 0.1–1.0)
Monocytes Relative: 8 %
Neutro Abs: 4.1 10*3/uL (ref 1.7–7.7)
Neutrophils Relative %: 55 %
Platelets: 336 10*3/uL (ref 150–400)
RBC: 4.05 MIL/uL (ref 3.87–5.11)
RDW: 15.8 % — ABNORMAL HIGH (ref 11.5–15.5)
WBC: 7.4 10*3/uL (ref 4.0–10.5)
nRBC: 0 % (ref 0.0–0.2)

## 2022-09-08 LAB — COMPREHENSIVE METABOLIC PANEL
ALT: 22 U/L (ref 0–44)
AST: 36 U/L (ref 15–41)
Albumin: 3.6 g/dL (ref 3.5–5.0)
Alkaline Phosphatase: 83 U/L (ref 38–126)
Anion gap: 9 (ref 5–15)
BUN: 18 mg/dL (ref 6–20)
CO2: 20 mmol/L — ABNORMAL LOW (ref 22–32)
Calcium: 8.7 mg/dL — ABNORMAL LOW (ref 8.9–10.3)
Chloride: 108 mmol/L (ref 98–111)
Creatinine, Ser: 0.64 mg/dL (ref 0.44–1.00)
GFR, Estimated: >60 mL/min (ref >60)
Glucose, Bld: 87 mg/dL (ref 70–99)
Potassium: 3.7 mmol/L (ref 3.5–5.1)
Sodium: 137 mmol/L (ref 135–145)
Total Bilirubin: 0.7 mg/dL (ref 0.3–1.2)
Total Protein: 6.9 g/dL (ref 6.5–8.1)

## 2022-09-08 LAB — BRAIN NATRIURETIC PEPTIDE: B Natriuretic Peptide: 29 pg/mL (ref 0.0–100.0)

## 2022-09-08 NOTE — ED Notes (Signed)
EDPA Provider at bedside. 

## 2022-09-08 NOTE — Discharge Instructions (Addendum)
You were seen in the emergency department for bilateral lower leg swelling. Your labs and imaging were all normal at this time. I have low concern for a blood clot for you, but I would still encourage you return in the morning for an ultrasound to check for any potential blood clots in the legs. Otherwise, follow up with your primary care provider as needed.

## 2022-09-08 NOTE — ED Triage Notes (Signed)
Pt via POV c/o bilateral lower leg and right arm swelling since earlier this afternoon. Pt reports tightness but no pain and says she is having difficulty standing and walking due to the leg swelling. Recent back surgery (April 4) and still recovering from her procedure. Hx HTN, sciatica, spinal leak. Denies CHF.

## 2022-09-08 NOTE — ED Provider Notes (Signed)
[place] EMERGENCY DEPARTMENT AT [facility] Provider Note   CSN: [id number] Arrival date & time: 09/08/22  1717     History Chief Complaint  Patient presents with   Leg Swelling    Marie Mills is a 52 y.o. female.  Patient presents emergency department complaints of leg swelling.  She reports that she is also been experiencing some associated upper extremity swelling.  No prior history of any CHF or fluid overload.  Recently had surgery on her back.  Not currently on any anticoagulants.  Denies shortness of breath, chest pain, abdominal pain, nausea, vomiting, diarrhea.  HPI     Home Medications Prior to Admission medications   Medication Sig Start Date End Date Taking? Authorizing Provider  albuterol (VENTOLIN HFA) 108 (90 Base) MCG/ACT inhaler Inhale 2 puffs into the lungs every 4 (four) hours as needed for wheezing or shortness of breath. 05/13/22  Yes Particia Nearing, PA-C  amLODipine (NORVASC) 2.5 MG tablet Take 2.5 mg by mouth daily. 10/18/21  Yes [provider]  baclofen (LIORESAL) 10 MG tablet Take 1 tablet (10 mg total) by mouth 3 (three) times daily. 07/19/22 07/19/23 Yes Susanne Borders, PA  cetirizine (ZYRTEC) 10 MG tablet Take 1 tablet (10 mg total) by mouth daily. 08/09/21  Yes Leath-Warren, Sadie Haber, NP  clotrimazole-betamethasone (LOTRISONE) cream Apply 1 Application topically 2 (two) times daily. Patient taking differently: Apply 1 Application topically 2 (two) times daily as needed. 05/30/22  Yes Cyril Mourning A, NP  Cyanocobalamin (B-12) 100 MCG TABS Take 1 tablet by mouth daily.   Yes [provider]  fluticasone (FLONASE) 50 MCG/ACT nasal spray Place 1 spray into both nostrils daily as needed.   Yes [provider]  gabapentin (NEURONTIN) 300 MG capsule Take 2 capsules (600 mg total) by mouth 3 (three) times daily. Patient taking differently: Take 300 mg by mouth 3 (three) times daily. 07/13/22  Yes Susanne Borders, PA  ipratropium (ATROVENT) 0.06 % nasal spray Place 2 sprays into both nostrils 4 (four) times daily as needed.   Yes [provider]  losartan (COZAAR) 50 MG tablet Take 50 mg by mouth daily. 09/08/21  Yes [provider]  magnesium oxide (MAG-OX) 400 (240 Mg) MG tablet Take 400 mg by mouth daily.   Yes [provider]  metoprolol succinate (TOPROL-XL) 25 MG 24 hr tablet Take 25 mg by mouth daily. 04/23/19  Yes [provider]  Misc Natural Products (ELDERBERRY IMMUNE COMPLEX PO) Take 2 tablets by mouth daily.   Yes [provider]  montelukast (SINGULAIR) 10 MG tablet Take 10 mg by mouth daily. 05/03/21  Yes [provider]  Norgestim-Eth Estrad Triphasic (TRI-SPRINTEC PO) Take 1 tablet by mouth daily.   Yes [provider]  nystatin cream (MYCOSTATIN) Apply 1 Application topically 2 (two) times daily. Patient taking differently: Apply 1 Application topically 2 (two) times daily as needed for dry skin. 12/06/21  Yes Adline Potter, NP  omeprazole (PRILOSEC) 40 MG capsule TK 1 C PO QD Patient taking differently: Take 40 mg by mouth daily. 03/04/19  Yes Adline Potter, NP  Pediatric Multiple Vitamins (FLINSTONES GUMMIES OMEGA-3 DHA PO) Take by mouth.   Yes [provider]  Potassium 99 MG TABS Take 1 tablet by mouth daily.   Yes [provider]  Pyridoxine HCl (B-6 PO) Take 1 tablet by mouth daily.   Yes [provider]  budesonide (PULMICORT) 1 MG/2ML nebulizer solution Take 1  mg by nebulization daily as needed. Patient not taking: Reported on 09/08/2022 09/08/21   [provider]  oxyCODONE (OXY IR/ROXICODONE) 5 MG immediate release tablet Take 1-2 tablets (5-10 mg total) by mouth every 4 (four) hours as needed for severe pain. Patient not taking: Reported on 09/08/2022 07/19/22   Susanne Borders, PA  senna (SENOKOT) 8.6 MG TABS tablet Take 1 tablet (8.6 mg total) by mouth daily as needed  for mild constipation. Patient not taking: Reported on 09/08/2022 06/15/22   Susanne Borders, PA  sulindac (CLINORIL) 150 MG tablet TAKE 1 TABLET(150 MG) BY MOUTH TWICE DAILY Patient not taking: Reported on 09/08/2022 08/29/22   Vickki Hearing, MD  valACYclovir (VALTREX) 1000 MG tablet Take 1 tablet (1,000 mg total) by mouth 2 (two) times daily. Patient not taking: Reported on 09/08/2022 12/14/21   Cyril Mourning A, NP      Allergies    Penicillins, Codeine, and Sulfa antibiotics    Review of Systems   Review of Systems  Cardiovascular:  Positive for leg swelling.  All other systems reviewed and are negative.   Physical Exam Updated Vital Signs BP 134/76 (BP Location: Left Arm)   Pulse 77   Temp 98.6 F (37 C) (Oral)   Resp 20   Ht 5\' 6"  (1.676 m)   Wt 129.3 kg   LMP 08/11/2022 (Approximate)   SpO2 100%   BMI 46.00 kg/m  Physical Exam Vitals and nursing note reviewed.  Constitutional:      General: She is not in acute distress.    Appearance: She is well-developed.  HENT:     Head: Normocephalic and atraumatic.  Eyes:     Conjunctiva/sclera: Conjunctivae normal.  Cardiovascular:     Rate and Rhythm: Normal rate and regular rhythm.     Heart sounds: No murmur heard. Pulmonary:     Effort: Pulmonary effort is normal. No respiratory distress.     Breath sounds: Normal breath sounds.  Abdominal:     Palpations: Abdomen is soft.     Tenderness: There is no abdominal tenderness.  Musculoskeletal:        General: No swelling.     Cervical back: Neck supple.     Right lower leg: Edema present.     Left lower leg: Edema present.     Comments: +1 edema bilaterally  Skin:    General: Skin is warm and dry.     Capillary Refill: Capillary refill takes less than 2 seconds.  Neurological:     Mental Status: She is alert.  Psychiatric:        Mood and Affect: Mood normal.     ED Results / Procedures / Treatments   Labs (all labs ordered are listed, but only  abnormal results are displayed) Labs Reviewed  COMPREHENSIVE METABOLIC PANEL - Abnormal; Notable for the following components:      Result Value   CO2 20 (*)    Calcium 8.7 (*)    All other components within normal limits  CBC WITH DIFFERENTIAL/PLATELET - Abnormal; Notable for the following components:   Hemoglobin 10.3 (*)    HCT 32.7 (*)    MCH 25.4 (*)    RDW 15.8 (*)    All other components within normal limits  BRAIN NATRIURETIC PEPTIDE    EKG None  Radiology DG Chest Portable 1 View  Result Date: 09/08/2022 CLINICAL DATA:  Leg swelling and right arm swelling with elevated blood pressure. EXAM: PORTABLE CHEST 1 VIEW COMPARISON:  Sep 11, 2021 FINDINGS: The heart size and mediastinal contours are within normal limits. Both lungs are clear. The visualized skeletal structures are unremarkable. IMPRESSION: No active disease. Electronically Signed   By: Aram Candela M.D.   On: 09/08/2022 19:20    Procedures Procedures   Medications Ordered in ED Medications - No data to display  ED Course/ Medical Decision Making/ A&P                           Medical Decision Making Amount and/or Complexity of Data Reviewed Labs: ordered. Radiology: ordered.   This patient presents to the ED for concern of leg swelling.  Differential diagnosis includes CHF, DVT, lymphedema   Lab Tests:  I Ordered, and personally interpreted labs.  The pertinent results include: Mild anemia noted on CBC, CMP unremarkable, BNP normal   Imaging Studies ordered:  I ordered imaging studies including chest x-ray I independently visualized and interpreted imaging which showed no acute cardiopulmonary process I agree with the radiologist interpretation    Problem List / ED Course:  Patient presents emergency department complaints of leg swelling.  He also reports some upper extremity swelling that began this afternoon.  States that she recently had back surgery approximately 1 month ago and has  not had the symptoms present during that time.  Denies current use of any anticoagulation. Denies SOB, chest pain, nausea, vomiting, diarrhea. No current hormonal medication use. Low concern for DVT/PE at this time, but patient would prefer to return for DVT US r/o tomorrow morning. All other labs largely negative at this time for potential causes of swelling patient is currently reporting. Given low concern for DVT, would prefer to hold off on use of anticoagulation. Order for US DVT placed and patient will return for imaging at that time. Patient agreeable with treatment plan and verbalized understanding all return precautions. All questions answered prior to patient discharge.  Final Clinical Impression(s) / ED Diagnoses Final diagnoses:  Bilateral lower extremity edema    Rx / DC Orders ED Discharge Orders          Ordered    Lower Ext Bilat Venous US       Comments: IMPORTANT PATIENT INSTRUCTIONS:  Your ED provider has recommended an Outpatient Ultrasound.  Please call [phone] to schedule an appointment.  If your appointment is scheduled for a Saturday, Sunday or holiday, please go to the [facility] Emergency Department Registration Desk at least 15 minutes prior to your appointment time and tell them you are there for an ultrasound.    If your appointment is scheduled for a weekday (Monday-Friday), please go directly to the [facility] Radiology Department at least 15 minutes prior to your appointment time and tell them you are there for an ultrasound.  Please call [phone] with questions.   09/08/22 2119              Smitty Knudsen, PA-C 09/08/22 2255    Derwood Kaplan, MD 09/10/22 [phone]

## 2022-09-09 ENCOUNTER — Ambulatory Visit (HOSPITAL_COMMUNITY)
Admission: RE | Admit: 2022-09-09 | Discharge: 2022-09-09 | Disposition: A | Discharge status: Home / Self Care | Payer: Medicaid Other | Source: Ambulatory Visit | Attending: Emergency Medicine | Admitting: Emergency Medicine

## 2022-09-09 DIAGNOSIS — R600 Localized edema: Secondary | ICD-10-CM | POA: Insufficient documentation

## 2022-09-20 ENCOUNTER — Encounter: Payer: Self-pay | Admitting: Orthopedic Surgery

## 2022-09-20 ENCOUNTER — Other Ambulatory Visit: Payer: Self-pay

## 2022-09-20 ENCOUNTER — Ambulatory Visit (INDEPENDENT_AMBULATORY_CARE_PROVIDER_SITE_OTHER): Payer: Medicaid Other | Admitting: Neurosurgery

## 2022-09-20 ENCOUNTER — Telehealth: Payer: Self-pay

## 2022-09-20 VITALS — BP 158/81 | HR 87 | Temp 98.3°F | Ht 66.0 in | Wt 282.8 lb

## 2022-09-20 DIAGNOSIS — Z09 Encounter for follow-up examination after completed treatment for conditions other than malignant neoplasm: Secondary | ICD-10-CM

## 2022-09-20 DIAGNOSIS — T8149XD Infection following a procedure, other surgical site, subsequent encounter: Secondary | ICD-10-CM

## 2022-09-20 DIAGNOSIS — M5416 Radiculopathy, lumbar region: Secondary | ICD-10-CM

## 2022-09-20 DIAGNOSIS — R202 Paresthesia of skin: Secondary | ICD-10-CM

## 2022-09-20 MED ORDER — PREGABALIN 50 MG PO CAPS: 50.0000 mg | ORAL_CAPSULE | Freq: Three times a day (TID) | ORAL | 0 refills | Status: DC

## 2022-09-20 NOTE — Telephone Encounter (Signed)
I sent the referral to [facility], this should be closer to her and hopefully they can get her in sooner.

## 2022-09-20 NOTE — Progress Notes (Signed)
Given work note keeping her out for 2 months per Dr. Myer Haff.

## 2022-09-20 NOTE — Progress Notes (Signed)
   DOS: 06/15/2022 (L L3/4 Microdiscectomy) 07/01/2022 (I and D) 07/21/2022 (Reexploration and CSF leak repair by Dr. Roe Coombs)  HISTORY OF PRESENT ILLNESS: 09/20/2022 Marie Mills is status post lumbar microdiscectomy complicated by an occult spinal fluid leak, wound infection, and necessity for CSF leak repair in delayed fashion.  Her acute severe pain is improved.  She continues to have burning and irritating numbness in her left leg in a similar distribution.  She also describes some numbness in her private area.  She has difficulty with some of the activities which used to not be an issue for her.  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   PHYSICAL EXAMINATION:   Vitals:   09/20/22 1141  BP: (!) 158/81  Pulse: 87  Temp: 98.3 F (36.8 C)   General: Patient is well developed, well nourished, calm, collected, and in no apparent distress.  NEUROLOGICAL:  General: In no acute distress.  Awake, alert, oriented to person, place, and time. Pupils equal round and reactive to light.   Strength:  Side Iliopsoas Quads Hamstring PF DF EHL  R 5 5 5 5 5 5   L 5 5 5 5 5 5    Incision c/d/i   ROS (Neurologic): Negative except as noted above  IMAGING: No interval imaging to review   ASSESSMENT/PLAN:  Marie Mills is doing poor to fair after microdiscectomy complicated by occult spinal fluid leak and wound infection.  She is off antibiotics.  She has symptoms of a persistent chronic radiculopathy.  We discussed the options.  She has had some swelling from gabapentin.  I do not feel she has tolerating the side effects of this medication.  She is unable to dose escalate, which might help her symptoms.  Because she no longer can tolerate gabapentin, I recommended switching to pregabalin.  I will send her for physical therapy, repeat her MRI scan, and send for nerve conduction study.  I spent a total of 30 minutes in this patient's care today. This  time was spent reviewing pertinent records including imaging studies, obtaining and confirming history, performing a directed evaluation, formulating and discussing my recommendations, and documenting the visit within the medical record.    [name] MD, [facility] Department of Neurosurgery

## 2022-09-20 NOTE — Telephone Encounter (Signed)
Ok thanks. Please make sure she is aware.

## 2022-09-20 NOTE — Telephone Encounter (Signed)
Dr Myer Haff has ordered an EMG of her left lower extremity.

## 2022-09-21 NOTE — Telephone Encounter (Signed)
EMG 09/22/2022 at [facility]

## 2022-09-22 ENCOUNTER — Ambulatory Visit: Payer: Medicaid Other | Admitting: Neurology

## 2022-09-22 DIAGNOSIS — R202 Paresthesia of skin: Secondary | ICD-10-CM

## 2022-09-22 NOTE — Procedures (Signed)
  [facility]  [address], [address]  [place], [place] [address] Tel: [phone] Fax: [phone] Test Date:  09/22/2022  Patient: Dayamin Munyan DOB: 06/16/1970 Physician: Nita Sickle, DO  Sex: Female Height: 5\' 6"  Ref Phys: [name], MD  ID#: [id number]   Technician:    History: This is a 52 year old female with history of L3-4 microdiscectomy referred for evaluation of left leg pain and paresthesias.  NCV & EMG Findings: Extensive electrodiagnostic testing of the left lower extremity shows:  Left sural and superficial peroneal sensory responses are within normal limits. Left peroneal motor response at the extensor digitorum brevis is mildly reduced, and normal at the tibialis anterior.  Left tibial motor responses within normal limits. Left tibial H reflex study is within normal limits. There is no evidence of active or chronic motor axonal loss changes affecting any of the tested muscles.  Motor unit configuration and recruitment pattern is within normal limits.  Impression: This is a normal study of the left lower extremity.  In particular, there is no evidence of a lumbar radiculopathy or large fiber sensorimotor polyneuropathy neuropathy.     ___________________________ Nita Sickle, DO    Nerve Conduction Studies   Stim Site NR Peak (ms) Norm Peak (ms) O-P Amp (V) Norm O-P Amp  Left Sup Peroneal Anti Sensory (Ant Lat Mall)  32 C  12 cm    1.9 <4.6 9.5 >4  Left Sural Anti Sensory (Lat Mall)  32 C  Calf    2.8 <4.6 13.5 >4     Stim Site NR Onset (ms) Norm Onset (ms) O-P Amp (mV) Norm O-P Amp Site1 Site2 Delta-0 (ms) Dist (cm) Vel (m/s) Norm Vel (m/s)  Left Peroneal Motor (Ext Dig Brev)  32 C  Ankle    3.5 <6.0 *1.9 >2.5 B Fib Ankle 7.3 36.0 49 >40  B Fib    10.8  1.7  Poplt B Fib 1.6 8.0 50 >40  Poplt    12.4  1.6         Left Peroneal TA Motor (Tib Ant)  32 C  Fib Head    2.3 <4.5 3.3 >3 Poplit Fib Head 1.4 8.0 57 >40  Poplit    3.7  <5.7 3.1         Left Tibial Motor (Abd Hall Brev)  32 C  Ankle    3.4 <6.0 9.1 >4 Knee Ankle 7.8 40.0 51 >40  Knee    11.2  7.7          Electromyography   Side Muscle Ins.Act Fibs Fasc Recrt Amp Dur Poly Activation Comment  Left AntTibialis Nml Nml Nml Nml Nml Nml Nml Nml N/A  Left Gastroc Nml Nml Nml Nml Nml Nml Nml Nml N/A  Left Flex Dig Long Nml Nml Nml Nml Nml Nml Nml Nml N/A  Left BicepsFemS Nml Nml Nml Nml Nml Nml Nml Nml N/A  Left RectFemoris Nml Nml Nml Nml Nml Nml Nml Nml N/A  Left GluteusMed Nml Nml Nml Nml Nml Nml Nml Nml N/A  Left AdductorLong Nml Nml Nml Nml Nml Nml Nml Nml N/A      Waveforms:

## 2022-09-28 ENCOUNTER — Telehealth: Payer: Self-pay | Admitting: Neurosurgery

## 2022-09-28 NOTE — Telephone Encounter (Signed)
Patient is calling today, she did call yesterday asking about her EMG results. I told her that I would route the results to Dr.Yarbrough and we would call her back with the next step. She is calling back today because she has not heard back from our office. She was told he is in surgery and we would try to call her back tomorrow.

## 2022-09-28 NOTE — Telephone Encounter (Signed)
I notified Marie Mills of her EMG results. She is scheduled for the soonest available date for her MRI and is on the cancellation list.

## 2022-10-21 ENCOUNTER — Ambulatory Visit (HOSPITAL_COMMUNITY)
Admission: RE | Admit: 2022-10-21 | Discharge: 2022-10-21 | Disposition: A | Discharge status: Home / Self Care | Payer: Medicaid Other | Source: Ambulatory Visit | Attending: Neurosurgery | Admitting: Neurosurgery

## 2022-10-21 DIAGNOSIS — M5416 Radiculopathy, lumbar region: Secondary | ICD-10-CM

## 2022-10-21 MED ORDER — GADOBUTROL 1 MMOL/ML IV SOLN
10.0000 mL | Freq: Once | INTRAVENOUS | Status: AC | PRN
  Administered 2022-10-21: 10 mL via INTRAVENOUS

## 2022-10-25 ENCOUNTER — Telehealth: Payer: Self-pay

## 2022-10-25 MED ORDER — PREGABALIN 50 MG PO CAPS: 50.0000 mg | ORAL_CAPSULE | Freq: Three times a day (TID) | ORAL | 0 refills | Status: DC

## 2022-10-25 NOTE — Telephone Encounter (Signed)
Patient is requesting a refill on Lyrica 50mg , 1 tablet by mouth 3 times daily, I have pended this, please sign if appropriate.    [facility] in [place].

## 2022-10-25 NOTE — Telephone Encounter (Signed)
Patient notified

## 2022-10-27 ENCOUNTER — Ambulatory Visit (HOSPITAL_COMMUNITY): Payer: Medicaid Other | Admitting: Physical Therapy

## 2022-11-01 ENCOUNTER — Ambulatory Visit: Payer: Medicaid Other | Admitting: Neurosurgery

## 2022-11-01 ENCOUNTER — Encounter: Payer: Self-pay | Admitting: Neurosurgery

## 2022-11-01 VITALS — BP 162/101 | HR 104 | Ht 66.0 in | Wt 279.2 lb

## 2022-11-01 DIAGNOSIS — M5416 Radiculopathy, lumbar region: Secondary | ICD-10-CM

## 2022-11-01 MED ORDER — IBUPROFEN 800 MG PO TABS: 800.0000 mg | ORAL_TABLET | Freq: Three times a day (TID) | ORAL | 0 refills | Status: DC | PRN

## 2022-11-01 MED ORDER — PREGABALIN 150 MG PO CAPS: 150.0000 mg | ORAL_CAPSULE | Freq: Every day | ORAL | 0 refills | Status: DC

## 2022-11-01 NOTE — Progress Notes (Signed)
   DOS: 06/15/2022 (L L3/4 Microdiscectomy) 07/01/2022 (I and D) 07/21/2022 (Reexploration and CSF leak repair by Dr. Roe Coombs)  HISTORY OF PRESENT ILLNESS: 11/01/2022 She has seen some improvement in her L leg symptoms since starting pregabalin.  However, she still has symptoms that preclude her from sleeping in the bed at night.  09/20/2022 [name] is status post lumbar microdiscectomy complicated by an occult spinal fluid leak, wound infection, and necessity for CSF leak repair in delayed fashion.  Her acute severe pain is improved.  She continues to have burning and irritating numbness in her left leg in a similar distribution.  She also describes some numbness in her private area.  She has difficulty with some of the activities which used to not be an issue for her.  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   PHYSICAL EXAMINATION:   Vitals:   11/01/22 1520  BP: (!) 162/101  Pulse: (!) 104    General: Patient is well developed, well nourished, calm, collected, and in no apparent distress.  NEUROLOGICAL:  General: In no acute distress.  Awake, alert, oriented to person, place, and time. Pupils equal round and reactive to light.   Strength:  Side Iliopsoas Quads Hamstring PF DF EHL  R 5 5 5 5 5 5   L 5 5 5 5 5 5    Incision c/d/i   ROS (Neurologic): Negative except as noted above  IMAGING: MRI L spine 10/21/2022 IMPRESSION: 1. Interval surgery at L3-4 with enlarging nonspecific fluid collection within the laminectomy bed but no significant residual mass effect on the thecal sac. 2. Additional new fluid collection within the midline subcutaneous fat demonstrates mild peripheral enhancement and could reflect a seroma or small abscess. 3. No evidence of discitis or osteomyelitis. 4. The previously demonstrated central disc protrusion at L4-5 has partially involuted with improved patency of the thecal sac and lateral recesses. 5.  Stable mild foraminal narrowing bilaterally at L3-4 and L4-5.     Electronically Signed   By: Carey Bullocks M.D.   On: 10/29/2022 16:35  EMG nerve conduction study from September 22, 2022 shows normal findings.  ASSESSMENT/PLAN:  Marie Mills is doing better after changing to pregabalin.  However, she is still having significant nighttime symptoms.  I will increase her nighttime dose to 150 mg.  She will take 50 mg twice during the day.  She will have to alter her dosage depending whether she is working third shift or off work.  I will release her to return to work next month.  She is also starting physical therapy next month.  I will see her back in 3 months.   I spent a total of 10 minutes in this patient's care today. This time was spent reviewing pertinent records including imaging studies, obtaining and confirming history, performing a directed evaluation, formulating and discussing my recommendations, and documenting the visit within the medical record.    [name] MD, [facility] Department of Neurosurgery

## 2022-11-03 ENCOUNTER — Other Ambulatory Visit: Payer: Self-pay | Admitting: Orthopedic Surgery

## 2022-11-11 ENCOUNTER — Telehealth: Payer: Self-pay | Admitting: Neurosurgery

## 2022-11-11 DIAGNOSIS — Z9889 Other specified postprocedural states: Secondary | ICD-10-CM

## 2022-11-11 DIAGNOSIS — M5416 Radiculopathy, lumbar region: Secondary | ICD-10-CM

## 2022-11-11 MED ORDER — PREGABALIN 50 MG PO CAPS
50.0000 mg | ORAL_CAPSULE | Freq: Two times a day (BID) | ORAL | 2 refills | Status: DC
Start: 2022-11-11 — End: 2023-02-28

## 2022-11-11 NOTE — Telephone Encounter (Signed)
Lyrica 50mg  bid script sent with comment that she is taking lyrica 150mg  q hs.

## 2022-11-11 NOTE — Telephone Encounter (Signed)
Pt called in stating the pharmacist was refusing to fill her medication (Lyrica) due to the dosages that the Dr. Jeannie Fend stated she should take. 150mg  at night and 50 mg during the day is what the pt stated.  Spoke With [place] and she said she would call the pharmacy to clarify the issue and call pt back

## 2022-11-11 NOTE — Telephone Encounter (Signed)
Crisitin has notified the patient that the medication has been sent  and corrected at the pharmacy.

## 2022-11-11 NOTE — Telephone Encounter (Signed)
I spoke with the pharmacist and he stated that in there system it is showing Lyrica 50mg  TID and also Lyrica 150mg  every night. I told him that per Dr. Zoila Shutter note on 11/01/22 they discussed taking 150mg  at night and 2-50mg  lyricas during the day.   He stated that he is going to close out the Lyrica 50mg  TID and in order for him to fill the prescription of the Lyrica 150mg  we need to send a new prescription of Lyrica 50mg  BID to the pharmacy with a note in the comments that states she will also be taking 150mg  at night time.   Please send to [facility] in [place].

## 2022-12-08 ENCOUNTER — Ambulatory Visit (HOSPITAL_COMMUNITY): Payer: Medicaid Other | Attending: Neurosurgery

## 2022-12-13 ENCOUNTER — Ambulatory Visit (HOSPITAL_COMMUNITY): Payer: Medicaid Other

## 2023-01-12 ENCOUNTER — Other Ambulatory Visit: Payer: Self-pay | Admitting: Neurosurgery

## 2023-01-12 DIAGNOSIS — Z9889 Other specified postprocedural states: Secondary | ICD-10-CM

## 2023-01-12 DIAGNOSIS — M5416 Radiculopathy, lumbar region: Secondary | ICD-10-CM

## 2023-01-12 NOTE — Telephone Encounter (Signed)
Called an spoke with patient she stated she is not taking the sulidac (clinoril)  She is requesting a refill for Ibuprofen 800mg , advised I will send this request to [facility]

## 2023-01-12 NOTE — Telephone Encounter (Signed)
Got a refill request for motrin.   She also has sulindac (clinoril) on her list- she should be taking motrin or sulindac, not both.   Which one is she taking? Let me know if she still needs refill of motrin.

## 2023-01-12 NOTE — Telephone Encounter (Signed)
Please let her know that motrin was called into pharmacy. I removed the sulindac.

## 2023-01-12 NOTE — Telephone Encounter (Signed)
Patient has been notified

## 2023-01-31 ENCOUNTER — Ambulatory Visit: Payer: Medicaid Other | Admitting: Neurosurgery

## 2023-02-14 ENCOUNTER — Ambulatory Visit: Payer: Medicaid Other | Admitting: Neurosurgery

## 2023-02-21 ENCOUNTER — Encounter: Payer: Self-pay | Admitting: Neurosurgery

## 2023-02-28 ENCOUNTER — Other Ambulatory Visit: Payer: Self-pay | Admitting: Family Medicine

## 2023-02-28 DIAGNOSIS — M5416 Radiculopathy, lumbar region: Secondary | ICD-10-CM

## 2023-02-28 DIAGNOSIS — Z9889 Other specified postprocedural states: Secondary | ICD-10-CM

## 2023-02-28 MED ORDER — PREGABALIN 150 MG PO CAPS
150.0000 mg | ORAL_CAPSULE | Freq: Every day | ORAL | 0 refills | Status: DC
Start: 2023-02-28 — End: 2023-03-28

## 2023-02-28 MED ORDER — PREGABALIN 50 MG PO CAPS
50.0000 mg | ORAL_CAPSULE | Freq: Two times a day (BID) | ORAL | 2 refills | Status: DC
Start: 2023-02-28 — End: 2023-03-28

## 2023-02-28 NOTE — Telephone Encounter (Signed)
Please call her and make sure she is still taking lyrica.   Is she doing lyrica 50mg  twice a day and lyrica 150mg  at night?   Does she need refills for both the 50mg  and 150mg ?   Please let me know. I only got a request for the 150mg  and it looks like she should have been out a month ago. Will decline it for now.

## 2023-02-28 NOTE — Telephone Encounter (Signed)
Left voicemail for patient to call the office 

## 2023-02-28 NOTE — Addendum Note (Signed)
Addended byDrake Leach on: 02/28/2023 04:17 PM   Modules accepted: Orders

## 2023-02-28 NOTE — Telephone Encounter (Signed)
Patient notified of refills.

## 2023-02-28 NOTE — Telephone Encounter (Signed)
Will refill both the lyrica 50mg  bid and the lyrica 150mg  at night.   She has f/u with Myer Haff on 03/02/23.   Please let her know they've been sent to pharmacy.

## 2023-02-28 NOTE — Telephone Encounter (Signed)
Patient is doing 50MG  and 150MG - needs refills for both sent to pharmacy

## 2023-03-02 ENCOUNTER — Ambulatory Visit: Payer: Medicaid Other | Admitting: Neurosurgery

## 2023-03-03 ENCOUNTER — Other Ambulatory Visit: Payer: Self-pay | Admitting: Adult Health

## 2023-03-07 ENCOUNTER — Ambulatory Visit: Payer: Medicaid Other | Admitting: Neurosurgery

## 2023-03-21 ENCOUNTER — Encounter: Payer: Self-pay | Admitting: Neurosurgery

## 2023-03-28 ENCOUNTER — Other Ambulatory Visit: Payer: Self-pay | Admitting: Orthopedic Surgery

## 2023-03-28 ENCOUNTER — Encounter: Payer: Self-pay | Admitting: Neurosurgery

## 2023-03-28 ENCOUNTER — Ambulatory Visit: Payer: Medicaid Other | Admitting: Neurosurgery

## 2023-03-28 DIAGNOSIS — Z9889 Other specified postprocedural states: Secondary | ICD-10-CM | POA: Diagnosis not present

## 2023-03-28 DIAGNOSIS — M5416 Radiculopathy, lumbar region: Secondary | ICD-10-CM | POA: Diagnosis not present

## 2023-03-28 MED ORDER — PREGABALIN 150 MG PO CAPS: 150.0000 mg | ORAL_CAPSULE | Freq: Two times a day (BID) | ORAL | 0 refills | Status: DC

## 2023-03-28 NOTE — Progress Notes (Signed)
   DOS: 06/15/2022 (L L3/4 Microdiscectomy) 07/01/2022 (I and D) 07/21/2022 (Reexploration and CSF leak repair by Dr. Roe Coombs)  HISTORY OF PRESENT ILLNESS: 03/28/2023 Marie Mills continues to have numbness in the stripe down the back of her left leg and in her private area on the left side.  It bothers her to urinate and defecate, though she can feel and control both.  She is back at work.  11/01/2022 She has seen some improvement in her L leg symptoms since starting pregabalin.  However, she still has symptoms that preclude her from sleeping in the bed at night.  09/20/2022 Marie Mills is status post lumbar microdiscectomy complicated by an occult spinal fluid leak, wound infection, and necessity for CSF leak repair in delayed fashion.  Her acute severe pain is improved.  She continues to have burning and irritating numbness in her left leg in a similar distribution.  She also describes some numbness in her private area.  She has difficulty with some of the activities which used to not be an issue for her.  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   PHYSICAL EXAMINATION:   Vitals:   03/28/23 1550  BP: (!) 148/92    General: Patient is well developed, well nourished, calm, collected, and in no apparent distress.  NEUROLOGICAL:  General: In no acute distress.  Awake, alert, oriented to person, place, and time. Pupils equal round and reactive to light.   Strength:  Side Iliopsoas Quads Hamstring PF DF EHL  R 5 5 5 5 5 5   L 5 5 5 5 5 5    Incision c/d/i   ROS (Neurologic): Negative except as noted above  IMAGING: MRI L spine 10/21/2022 IMPRESSION: 1. Interval surgery at L3-4 with enlarging nonspecific fluid collection within the laminectomy bed but no significant residual mass effect on the thecal sac. 2. Additional new fluid collection within the midline subcutaneous fat demonstrates mild peripheral enhancement and could reflect  a seroma or small abscess. 3. No evidence of discitis or osteomyelitis. 4. The previously demonstrated central disc protrusion at L4-5 has partially involuted with improved patency of the thecal sac and lateral recesses. 5. Stable mild foraminal narrowing bilaterally at L3-4 and L4-5.     Electronically Signed   By: Carey Bullocks M.D.   On: 10/29/2022 16:35  EMG nerve conduction study from September 22, 2022 shows normal findings.  ASSESSMENT/PLAN:  Marie Mills is doing better, though we will make 1 change to her pregabalin to see if this will help have longer effectiveness during the day.  We will start on 150 mg twice a day.  Have asked her to contact me if that is not effective for her.  I will see her back in 2-33 months.   I spent a total of 10 minutes in this patient's care today. This time was spent reviewing pertinent records including imaging studies, obtaining and confirming history, performing a directed evaluation, formulating and discussing my recommendations, and documenting the visit within the medical record.    [name] MD, [facility] Department of Neurosurgery

## 2023-04-03 ENCOUNTER — Ambulatory Visit
Admission: EM | Admit: 2023-04-03 | Discharge: 2023-04-03 | Disposition: A | Discharge status: Home / Self Care | Payer: Medicaid Other

## 2023-04-03 DIAGNOSIS — B9789 Other viral agents as the cause of diseases classified elsewhere: Secondary | ICD-10-CM | POA: Diagnosis not present

## 2023-04-03 DIAGNOSIS — J329 Chronic sinusitis, unspecified: Secondary | ICD-10-CM | POA: Diagnosis not present

## 2023-04-03 NOTE — ED Provider Notes (Signed)
[facility]    CSN: [id number] Arrival date & time: 04/03/23  1700      History   Chief Complaint Chief Complaint  Patient presents with   Facial Pain    HPI [name] is a 52 y.o. female.   The history is provided by the patient.   Patient presents with a 4-day history of sinus pain and pressure, headache, nasal congestion, and and swollen lymph nodes in her neck.  She denies fever, chills, ear pain, ear drainage, wheezing, difficulty breathing, chest pain, abdominal pain, nausea, vomiting, diarrhea, or rash.  Patient reports she has been taking TheraFlu, using saline drops, and Flonase with minimal relief.  Patient states that she does take Singulair daily as well.  Denies any obvious known sick contacts.  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   a.) properative PCR (+) prior to LEFT L3-4 MICRODISCECTOMY   Obesity    Pneumonia    Seizure [facility])    age 26-3; unknown cause nor if on medication    Patient Active Problem List   Diagnosis Date Noted   Epidural abscess 07/04/2022   Postoperative wound infection 07/01/2022   Infection of lumbar spine (HCC) 07/01/2022   Lumbar discitis 06/28/2022   Asthma, chronic 06/28/2022   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   H/O microdiscectomy 06/28/2022   Lumbar disc herniation 06/15/2022   Encounter for screening fecal occult blood testing 03/23/2022   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) 02/15/2022   Lumbar radiculopathy 02/15/2022   Spinal stenosis of lumbar region with neurogenic claudication 02/15/2022   Chronic pain syndrome 02/15/2022   Acute bilateral low back pain with bilateral sciatica 12/30/2021   Headache above the eye region 12/24/2021   Recurrent sinusitis  12/24/2021   Skin excoriation 12/14/2021   Superficial fungus infection of skin 12/06/2021   Vaginal yeast infection 12/06/2021   Vaginal itching 12/06/2021   Vaginal burning 12/06/2021   Visit for suture removal 12/02/2021   Burning with urination 11/16/2021   History of sinusitis 11/12/2021   Nasal septal deviation 11/12/2021   Pain and swelling of right knee 10/29/2021   Encounter for screening colonoscopy 06/29/2021   Screening examination for STD (sexually transmitted disease) 05/06/2021   Epidermal cyst 05/06/2021   Screening mammogram for breast cancer 02/03/2021   Routine general medical examination at a health care facility 02/03/2021   Encounter for gynecological examination with Papanicolaou smear of cervix 02/03/2021   Encounter for surveillance of contraceptive pills 02/03/2021   Anxiety and depression 02/03/2021   Weight gain 07/04/2019   Body mass index 39.0-39.9, adult 05/28/2019   Body mass index 37.0-37.9, adult 04/08/2019   Body mass index 38.0-38.9, adult 02/04/2019   Body mass index 40.0-44.9, adult (HCC) 11/01/2018   Encounter for well woman exam with routine gynecological exam 10/08/2018   Weight loss counseling, encounter for 10/08/2018   Body mass index 45.0-49.9, adult (HCC) 10/08/2018   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 present 05/30/2016   Surveillance for birth control, oral contraceptives 05/30/2016   Family planning 05/30/2016   Screening for colorectal cancer 05/30/2016   Vaginal irritation 08/29/2013   BV (bacterial vaginosis) 08/29/2013   Essential hypertension 03/21/2013   Contraceptive management 03/21/2013    Past Surgical  History:  Procedure Laterality Date   CHOLECYSTECTOMY  1987   COLONOSCOPY WITH PROPOFOL N/A 07/30/2021   Procedure: COLONOSCOPY WITH PROPOFOL;  Surgeon: Lanelle Bal, DO;  [facility];  Service: Endoscopy;  Laterality: N/A;  2:45pm   I&D lumbar, CSF leak repair, lumbar drain  07/21/2022   Dr  Emogene [name]   LUMBAR LAMINECTOMY/DECOMPRESSION MICRODISCECTOMY Left 06/15/2022   Procedure: LEFT L3-4 MICRODISCECTOMY;  Surgeon: [name], MD;  Location: [facility];  Service: Neurosurgery;  Laterality: Left;   LUMBAR WOUND DEBRIDEMENT N/A 07/01/2022   Procedure: LUMBAR WOUND DEBRIDEMENT;  Surgeon: [name], MD;  Location: [facility];  Service: Neurosurgery;  Laterality: N/A;   POLYPECTOMY  07/30/2021   Procedure: POLYPECTOMY;  Surgeon: Lanelle Bal, DO;  Location: [facility];  Service: Endoscopy;;    OB History     Gravida  4   Para  3   Term  3   Preterm      AB  1   Living  3      SAB      IAB  1   Ectopic      Multiple      Live Births  3            Home Medications    Prior to Admission medications   Medication Sig Start Date End Date Taking? Authorizing Provider  albuterol (VENTOLIN HFA) 108 (90 Base) MCG/ACT inhaler Inhale 2 puffs into the lungs every 4 (four) hours as needed for wheezing or shortness of breath. 05/13/22  Yes Particia Nearing, PA-C  Cyanocobalamin (B-12) 100 MCG TABS Take 1 tablet by mouth daily.   Yes [provider]  fluticasone (FLONASE) 50 MCG/ACT nasal spray Place 1 spray into both nostrils daily as needed.   Yes [provider]  ipratropium (ATROVENT) 0.06 % nasal spray Place 2 sprays into both nostrils 4 (four) times daily as needed.   Yes [provider]  magnesium oxide (MAG-OX) 400 (240 Mg) MG tablet Take 400 mg by mouth daily.   Yes [provider]  metoprolol succinate (TOPROL-XL) 25 MG 24 hr tablet Take 25 mg by mouth daily. 04/23/19  Yes [provider]  Misc Natural Products (ELDERBERRY IMMUNE COMPLEX PO) Take 2 tablets by mouth daily.   Yes [provider]  montelukast (SINGULAIR) 10 MG tablet Take 10 mg by mouth daily. 05/03/21  Yes [provider]  norethindrone (MICRONOR) 0.35 MG tablet Take by mouth. 12/12/22  Yes [provider]  olmesartan (BENICAR) 40 MG tablet Take 40 mg by mouth daily. 11/28/22  Yes [provider]  omeprazole (PRILOSEC) 40 MG capsule TK 1 C PO QD Patient taking differently: Take 40 mg by mouth daily. 03/04/19  Yes Cyril Mourning A, NP  Potassium 99 MG TABS Take 1 tablet by mouth daily.   Yes [provider]  pregabalin (LYRICA) 150 MG capsule Take 1 capsule (150 mg total) by mouth 2 (two) times daily. 03/28/23  Yes [name], MD  Pyridoxine HCl (B-6 PO) Take 1 tablet by mouth daily.   Yes [provider]  sulindac (CLINORIL) 150 MG tablet TAKE 1 TABLET(150 MG) BY MOUTH TWICE DAILY 03/28/23  Yes Vickki Hearing, MD  amLODipine (NORVASC) 2.5 MG tablet Take 2.5 mg by mouth daily. Patient not taking: Reported on 04/03/2023 10/18/21   [provider]  baclofen (LIORESAL) 10 MG tablet Take 1 tablet (10 mg total) by mouth 3 (three) times daily. 07/19/22  07/19/23  Susanne Borders, PA  cetirizine (ZYRTEC) 10 MG tablet Take 1 tablet (10 mg total) by mouth daily. 08/09/21   Leath-Warren, Sadie Haber, NP  clotrimazole-betamethasone (LOTRISONE) cream Apply 1 Application topically 2 (two) times daily. Patient taking differently: Apply 1 Application topically 2 (two) times daily as needed. 05/30/22   Adline Potter, NP  ibuprofen (ADVIL) 800 MG tablet Take 1 tablet (800 mg total) by mouth every 8 (eight) hours as needed. Take with food. 01/12/23   Drake Leach, PA-C  nystatin cream (MYCOSTATIN) Apply 1 Application topically 2 (two) times daily. Patient taking differently: Apply 1 Application topically 2 (two) times daily as needed for dry skin. 12/06/21   Adline Potter, NP  Pediatric Multiple Vitamins (FLINSTONES GUMMIES OMEGA-3 DHA PO) Take by mouth.    [provider]  phentermine (ADIPEX-P) 37.5 MG tablet Take 37.5 mg by mouth every morning. 03/14/23   [provider]    Family History Family History  Problem Relation Age  of Onset   Hypertension Mother    Hyperlipidemia Mother    Diabetes Mother    Hyperlipidemia Father    Hypertension Father    Diabetes Father    Seizures Father    Diabetes Sister    Hypertension Sister    Diabetes Sister    Hypertension Sister    Hypertension Sister    Diabetes Brother    Hypertension Brother    Asthma Daughter    Asthma Son    Cancer Maternal Aunt        breast   Colon cancer Neg Hx    Colon polyps Neg Hx     Social History Social History   Tobacco Use   Smoking status: Never   Smokeless tobacco: Never  Vaping Use   Vaping status: Never Used  Substance Use Topics   Alcohol use: No   Drug use: No     Allergies   Penicillins, Codeine, and Sulfa antibiotics   Review of Systems Review of Systems Per HPI  Physical Exam Triage Vital Signs ED Triage Vitals  Encounter Vitals Group     BP 04/03/23 1734 (!) 143/85     Systolic BP Percentile --      Diastolic BP Percentile --      Pulse Rate 04/03/23 1732 98     Resp 04/03/23 1732 16     Temp 04/03/23 1732 98.2 F (36.8 C)     Temp Source 04/03/23 1732 Oral     SpO2 04/03/23 1732 98 %     Weight --      Height --      Head Circumference --      Peak Flow --      Pain Score 04/03/23 1734 4     Pain Loc --      Pain Education --      Exclude from Growth Chart --    No data found.  Updated Vital Signs BP (!) 143/85   Pulse 98   Temp 98.2 F (36.8 C) (Oral)   Resp 16   LMP 03/26/2023 (Exact Date)   SpO2 98%   Visual Acuity Right Eye Distance:   Left Eye Distance:   Bilateral Distance:    Right Eye Near:   Left Eye Near:    Bilateral Near:     Physical Exam Vitals and nursing note reviewed.  Constitutional:      General: She is not in acute distress.    Appearance: Normal appearance.  HENT:     Head: Normocephalic.     Right Ear: Tympanic membrane, ear canal and external ear normal.     Left Ear: Tympanic membrane, ear canal and external ear normal.     Nose:  Congestion present.     Right Turbinates: Enlarged and swollen.     Left Turbinates: Enlarged and swollen.     Right Sinus: Maxillary sinus tenderness present. No frontal sinus tenderness.     Left Sinus: Maxillary sinus tenderness present. No frontal sinus tenderness.     Mouth/Throat:     Lips: Pink.     Mouth: Mucous membranes are moist.     Pharynx: Oropharynx is clear. Uvula midline. Postnasal drip present. No pharyngeal swelling, oropharyngeal exudate, posterior oropharyngeal erythema or uvula swelling.     Comments: Cobblestoning present to posterior oropharynx  Eyes:     Extraocular Movements: Extraocular movements intact.     Conjunctiva/sclera: Conjunctivae normal.     Pupils: Pupils are equal, round, and reactive to light.  Cardiovascular:     Rate and Rhythm: Normal rate and regular rhythm.     Pulses: Normal pulses.     Heart sounds: Normal heart sounds.  Pulmonary:     Effort: Pulmonary effort is normal. No respiratory distress.     Breath sounds: Normal breath sounds. No stridor. No wheezing, rhonchi or rales.  Abdominal:     General: Bowel sounds are normal.     Palpations: Abdomen is soft.     Tenderness: There is no abdominal tenderness.  Musculoskeletal:     Cervical back: Normal range of motion.  Lymphadenopathy:     Cervical: No cervical adenopathy.  Skin:    General: Skin is warm and dry.  Neurological:     General: No focal deficit present.     Mental Status: She is alert and oriented to person, place, and time.  Psychiatric:        Mood and Affect: Mood normal.        Behavior: Behavior normal.      UC Treatments / Results  Labs (all labs ordered are listed, but only abnormal results are displayed) Labs Reviewed - No data to display  EKG   Radiology No results found.  Procedures Procedures (including critical care time)  Medications Ordered in UC Medications - No data to display  Initial Impression / Assessment and Plan / UC Course  I  have reviewed the triage vital signs and the nursing notes.  Pertinent labs & imaging results that were available during my care of the patient were reviewed by me and considered in my medical decision making (see chart for details).  Suspect a viral sinusitis at this time.  Will provide symptomatic treatment with Bromfed-DM to act as an antihistamine.  Patient will continue Zyrtec, Flonase, Singulair, and use of normal saline nasal spray she is currently taking.  Supportive care recommendations were provided and discussed with the patient to include over-the-counter analgesics, and use of a humidifier at nighttime during sleep.  Discussed indications with the patient regarding when follow-up may be indicated.  Patient was in agreement with this plan of care and verbalized understanding.  All questions were answered.  Patient stable for discharge.  Work note was provided.  Final Clinical Impressions(s) / UC Diagnoses   Final diagnoses:  Viral sinusitis     Discharge Instructions      Take medication as directed. Continue your current allergy medication regimen. Increase fluids and get plenty of rest. May  take over-the-counter ibuprofen or Tylenol as needed for pain, fever, or general discomfort. Recommend normal saline nasal spray to help with nasal congestion throughout the day. Follow-up in this clinic if symptoms do not improve over the next several days, or if symptoms worsen. Follow-up as needed.     ED Prescriptions   None    PDMP not reviewed this encounter.   Marie Cantor, NP 04/03/23 [phone]

## 2023-04-03 NOTE — ED Triage Notes (Signed)
Pt states sinus pain and pressure, swollen lymph nodes in neck, headache x 4 days. Taking Theraflu, saline drops, and Flonase.

## 2023-04-03 NOTE — Discharge Instructions (Addendum)
Take medication as directed. Continue your current allergy medication regimen. Increase fluids and get plenty of rest. May take over-the-counter ibuprofen or Tylenol as needed for pain, fever, or general discomfort. Recommend normal saline nasal spray to help with nasal congestion throughout the day. Follow-up in this clinic if symptoms do not improve over the next several days, or if symptoms worsen. Follow-up as needed.

## 2023-04-17 ENCOUNTER — Other Ambulatory Visit: Payer: Self-pay | Admitting: Adult Health

## 2023-04-20 ENCOUNTER — Telehealth: Payer: Self-pay | Admitting: Adult Health

## 2023-04-20 ENCOUNTER — Other Ambulatory Visit: Payer: Self-pay | Admitting: Adult Health

## 2023-04-20 MED ORDER — NORGESTIM-ETH ESTRAD TRIPHASIC 0.18/0.215/0.25 MG-25 MCG PO TABS: 1.0000 | ORAL_TABLET | Freq: Every day | ORAL | 3 refills | Status: DC

## 2023-04-20 NOTE — Telephone Encounter (Signed)
 Pt states she is in need of her medication before her visit. Pt states the refill was denied. Please advise

## 2023-04-20 NOTE — Addendum Note (Signed)
 Addended by: Cyril Mourning A on: 04/20/2023 04:58 PM   Modules accepted: Orders

## 2023-04-20 NOTE — Telephone Encounter (Signed)
 Pt needs refill on tri sprintec, BP was 123.84 at work,she is on BP meds too now. Has appt 05/18/23

## 2023-04-20 NOTE — Telephone Encounter (Addendum)
 Pt needs a refill on birth control. She has 2 days worth. Has appt end of Jan. Thanks! [name]

## 2023-05-02 ENCOUNTER — Ambulatory Visit: Payer: Self-pay

## 2023-05-15 ENCOUNTER — Other Ambulatory Visit: Payer: Self-pay

## 2023-05-15 ENCOUNTER — Telehealth: Payer: Self-pay | Admitting: Neurosurgery

## 2023-05-15 NOTE — Telephone Encounter (Signed)
lumbar/L L3/4 Microdiscectomy on 06/15/22  Pregabalin 150mg  2 x aday [facility] in [place] on [address]

## 2023-05-16 ENCOUNTER — Other Ambulatory Visit: Payer: Self-pay | Admitting: Neurosurgery

## 2023-05-16 ENCOUNTER — Other Ambulatory Visit: Payer: Self-pay

## 2023-05-16 DIAGNOSIS — M5416 Radiculopathy, lumbar region: Secondary | ICD-10-CM

## 2023-05-16 DIAGNOSIS — Z9889 Other specified postprocedural states: Secondary | ICD-10-CM

## 2023-05-16 MED ORDER — PREGABALIN 150 MG PO CAPS: 150.0000 mg | ORAL_CAPSULE | Freq: Two times a day (BID) | ORAL | 0 refills | Status: DC

## 2023-05-16 NOTE — Telephone Encounter (Signed)
Great - thanks

## 2023-05-16 NOTE — Telephone Encounter (Signed)
Yes please

## 2023-05-16 NOTE — Telephone Encounter (Signed)
Patient notified

## 2023-05-16 NOTE — Telephone Encounter (Signed)
Please notify the patient that Danielle sent a refill. Thank you!

## 2023-05-18 ENCOUNTER — Other Ambulatory Visit (HOSPITAL_COMMUNITY)
Admission: RE | Admit: 2023-05-18 | Discharge: 2023-05-18 | Disposition: A | Discharge status: Home / Self Care | Payer: Medicaid Other | Source: Ambulatory Visit | Attending: Adult Health | Admitting: Adult Health

## 2023-05-18 ENCOUNTER — Encounter: Payer: Self-pay | Admitting: Adult Health

## 2023-05-18 ENCOUNTER — Ambulatory Visit: Payer: Medicaid Other | Admitting: Adult Health

## 2023-05-18 VITALS — BP 157/89 | HR 92 | Ht 66.0 in | Wt 268.0 lb

## 2023-05-18 DIAGNOSIS — Z0000 Encounter for general adult medical examination without abnormal findings: Secondary | ICD-10-CM | POA: Insufficient documentation

## 2023-05-18 DIAGNOSIS — I10 Essential (primary) hypertension: Secondary | ICD-10-CM

## 2023-05-18 DIAGNOSIS — Z3041 Encounter for surveillance of contraceptive pills: Secondary | ICD-10-CM

## 2023-05-18 DIAGNOSIS — Z01419 Encounter for gynecological examination (general) (routine) without abnormal findings: Secondary | ICD-10-CM

## 2023-05-18 DIAGNOSIS — Z1231 Encounter for screening mammogram for malignant neoplasm of breast: Secondary | ICD-10-CM | POA: Diagnosis not present

## 2023-05-18 DIAGNOSIS — Z1331 Encounter for screening for depression: Secondary | ICD-10-CM | POA: Diagnosis not present

## 2023-05-18 DIAGNOSIS — Z1211 Encounter for screening for malignant neoplasm of colon: Secondary | ICD-10-CM | POA: Diagnosis not present

## 2023-05-18 LAB — HEMOCCULT GUIAC POC 1CARD (OFFICE): Fecal Occult Blood, POC: NEGATIVE

## 2023-05-18 MED ORDER — NORGESTIM-ETH ESTRAD TRIPHASIC 0.18/0.215/0.25 MG-25 MCG PO TABS: 1.0000 | ORAL_TABLET | Freq: Every day | ORAL | 3 refills | Status: DC

## 2023-05-18 NOTE — Progress Notes (Signed)
Patient ID: Marie Mills, female   DOB: 1971/01/10, 53 y.o.   MRN: [id number] History of Present Illness: Marie Mills is a 53 year old white female, divorced, [id number], in for a well woman gyn exam and pap. She is back at work since having back surgery and doing well, has some numbness on the left, that surgeon said could last up to a year. She is on first shift now and doing the cooking and helping with transportation.   She has all her kids back home now.  PCP is Dr Olena Leatherwood   Current Medications, Allergies, Past Medical History, Past Surgical History, Family History and Social History were reviewed in [employer] electronic medical record.     Review of Systems: Patient denies any headaches, hearing loss, fatigue, blurred vision, shortness of breath, chest pain, abdominal pain, problems with bowel movements, urination, or intercourse(not having sex). No joint pain or mood swings.     Physical Exam:BP (!) 157/89 (BP Location: Right Wrist, Patient Position: Sitting, Cuff Size: Normal)   Pulse 92   Ht 5\' 6"  (1.676 m)   Wt 268 lb (121.6 kg)   LMP 04/17/2023 (Approximate)   BMI 43.26 kg/m   General:  Well developed, well nourished, no acute distress Skin:  Warm and dry Neck:  Midline trachea, normal thyroid, good ROM, no lymphadenopathy Lungs; Clear to auscultation bilaterally Breast:  No dominant palpable mass, retraction, or nipple discharge Cardiovascular: Regular rate and rhythm Abdomen:  Soft, non tender, no hepatosplenomegaly Pelvic:  External genitalia is normal in appearance, no lesions.  The vagina is normal in appearance. Urethra has no lesions or masses. The cervix is bulbous. Pap with GC/CHL HR HPV genotyping performed.  Uterus is felt to be normal size, shape, and contour.  No adnexal masses or tenderness noted.Bladder is non tender, no masses felt. Rectal: Good sphincter tone, no polyps, or hemorrhoids felt.  Hemoccult negative. Extremities/musculoskeletal:  No swelling or  varicosities noted, no clubbing or cyanosis Psych:  No mood changes, alert and cooperative,seems happy AA is 0 Fall risk is low    05/18/2023    8:49 AM 09/01/2022    9:12 AM 03/05/2021    8:43 AM  Depression screen PHQ 2/9  Decreased Interest 0 0 0  Down, Depressed, Hopeless 0 0 0  PHQ - 2 Score 0 0 0  Altered sleeping 0    Tired, decreased energy 1    Change in appetite 1    Feeling bad or failure about yourself  1    Trouble concentrating 0    Moving slowly or fidgety/restless 0    Suicidal thoughts 0    PHQ-9 Score 3         05/18/2023    8:49 AM 02/03/2021    2:55 PM  GAD 7 : Generalized Anxiety Score  Nervous, Anxious, on Edge 0 3  Control/stop worrying 0 2  Worry too much - different things 0 3  Trouble relaxing 0 2  Restless 0 0  Easily annoyed or irritable 0 1  Afraid - awful might happen 0 0  Total GAD 7 Score 0 11      Upstream - 05/18/23 0846       Pregnancy Intention Screening   Does the patient want to become pregnant in the next year? No    Does the patient's partner want to become pregnant in the next year? No    Would the patient like to discuss contraceptive options today? No  Contraception Wrap Up   Current Method Oral Contraceptive    End Method Oral Contraceptive    Contraception Counseling Provided Yes            Examination chaperoned by Malachy Mood lPN  Impression and Plan: 1. Routine general medical examination at a health care facility (Primary) Pap sent Pap in 3 years if negative  Physical in 1 year Get mammogram  - Cytology - PAP( [place]) Colonoscopy per GI 2. Encounter for gynecological examination with Papanicolaou smear of cervix Pap sent   3. Encounter for screening fecal occult blood testing Hemoccult was negative  - POCT occult blood stool  4. Essential hypertension Take BP meds and follow up with PCP  5. Encounter for surveillance of contraceptive pills Happy with OC, will refill Meds ordered this  encounter  Medications   Norgestim-Eth Estrad Triphasic (NORGESTIMATE-ETHINYL ESTRADIOL TRIPHASIC) 0.18/0.215/0.25 MG-25 MCG tab    Sig: Take 1 tablet by mouth daily.    Dispense:  84 tablet    Refill:  3    Supervising Provider:   Duane Lope H [[id number]]

## 2023-05-23 LAB — CYTOLOGY - PAP
Chlamydia: NEGATIVE
Comment: NEGATIVE
Comment: NEGATIVE
Comment: NORMAL
High risk HPV: NEGATIVE
Neisseria Gonorrhea: NEGATIVE

## 2023-05-26 ENCOUNTER — Encounter: Payer: Self-pay | Admitting: Adult Health

## 2023-05-30 ENCOUNTER — Ambulatory Visit: Payer: Medicaid Other | Admitting: Neurosurgery

## 2023-05-30 ENCOUNTER — Other Ambulatory Visit: Payer: Self-pay | Admitting: Orthopedic Surgery

## 2023-06-15 ENCOUNTER — Ambulatory Visit: Payer: Medicaid Other | Admitting: Neurosurgery

## 2023-06-15 DIAGNOSIS — Z09 Encounter for follow-up examination after completed treatment for conditions other than malignant neoplasm: Secondary | ICD-10-CM | POA: Diagnosis not present

## 2023-06-15 DIAGNOSIS — M5416 Radiculopathy, lumbar region: Secondary | ICD-10-CM | POA: Diagnosis not present

## 2023-06-15 DIAGNOSIS — Z9889 Other specified postprocedural states: Secondary | ICD-10-CM

## 2023-06-15 MED ORDER — PREGABALIN 225 MG PO CAPS: 225.0000 mg | ORAL_CAPSULE | Freq: Two times a day (BID) | ORAL | 0 refills | Status: DC

## 2023-06-15 NOTE — Progress Notes (Signed)
   DOS: 06/15/2022 (L L3/4 Microdiscectomy) 07/01/2022 (I and D) 07/21/2022 (Reexploration and CSF leak repair by Dr. Roe Coombs)  HISTORY OF PRESENT ILLNESS: 06/15/2023 She continues to have symptoms.  03/28/2023 Marie Mills continues to have numbness in the stripe down the back of her left leg and in her private area on the left side.  It bothers her to urinate and defecate, though she can feel and control both.  She is back at work.  11/01/2022 She has seen some improvement in her L leg symptoms since starting pregabalin.  However, she still has symptoms that preclude her from sleeping in the bed at night.  09/20/2022 Marie Mills is status post lumbar microdiscectomy complicated by an occult spinal fluid leak, wound infection, and necessity for CSF leak repair in delayed fashion.  Her acute severe pain is improved.  She continues to have burning and irritating numbness in her left leg in a similar distribution.  She also describes some numbness in her private area.  She has difficulty with some of the activities which used to not be an issue for her.  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   PHYSICAL EXAMINATION:   There were no vitals filed for this visit.   General: Patient is well developed, well nourished, calm, collected, and in no apparent distress.  NEUROLOGICAL:  General: In no acute distress.  Awake, alert, oriented to person, place, and time. Pupils equal round and reactive to light.   Strength:  Side Iliopsoas Quads Hamstring PF DF EHL  R 5 5 5 5 5 5   L 5 5 5 5 5 5    Incision c/d/i   ROS (Neurologic): Negative except as noted above  IMAGING: MRI L spine 10/21/2022 IMPRESSION: 1. Interval surgery at L3-4 with enlarging nonspecific fluid collection within the laminectomy bed but no significant residual mass effect on the thecal sac. 2. Additional new fluid collection within the midline subcutaneous fat demonstrates mild  peripheral enhancement and could reflect a seroma or small abscess. 3. No evidence of discitis or osteomyelitis. 4. The previously demonstrated central disc protrusion at L4-5 has partially involuted with improved patency of the thecal sac and lateral recesses. 5. Stable mild foraminal narrowing bilaterally at L3-4 and L4-5.     Electronically Signed   By: Carey Bullocks M.D.   On: 10/29/2022 16:35  EMG nerve conduction study from September 22, 2022 shows normal findings.  ASSESSMENT/PLAN:  Marie Mills is doing better, but still has symptoms of chronic radiculopathy.  Will increase pregabalin to 225 mg twice a day.  Will contact her in 4 weeks to see how that is going.  I will follow-up with her in 2 to 3 months via telephone.  If we cannot achieve reasonable relief, she does have the option of a spinal cord stimulator.    I spent a total of 10 minutes in this patient's care today. This time was spent reviewing pertinent records including imaging studies, obtaining and confirming history, performing a directed evaluation, formulating and discussing my recommendations, and documenting the visit within the medical record.    [name] MD, [facility] Department of Neurosurgery

## 2023-07-12 ENCOUNTER — Telehealth: Payer: Self-pay

## 2023-07-12 NOTE — Telephone Encounter (Signed)
 Left message to return call

## 2023-07-12 NOTE — Telephone Encounter (Signed)
-----   Message from [facility] sent at 06/15/2023  4:04 PM EST ----- Can you call her to see how she is doing with the higher dose of lyrica?  Thx

## 2023-07-14 ENCOUNTER — Other Ambulatory Visit: Payer: Self-pay | Admitting: Neurosurgery

## 2023-07-14 MED ORDER — PREGABALIN 200 MG PO CAPS: 200.0000 mg | ORAL_CAPSULE | Freq: Three times a day (TID) | ORAL | 0 refills | Status: DC

## 2023-07-14 NOTE — Telephone Encounter (Signed)
 I spoke with Marie Mills. She has not noticed any side effects such as drowsiness. She feels like it helps, but doesn't last until her evening dose.   She takes 225mg  in the morning and 225mg  at night. She is wondering if she could add a dose in between.  She has 4 capsules left. Her pharmacy is [facility] [place] [facility]).

## 2023-07-14 NOTE — Telephone Encounter (Signed)
 I called Marie Mills after discussing Lyrica doses with Duwayne Heck PA and Cash PA.   They recommend adjusting the dose to either 200mg  TID or 225 mg bid with a 100mg  in the afternoon. They were wary about increasing her dose too much as the maximum daily limit of Lyrica is 600mg .   I discussed with [name] who was agreeable to these changes. She said she will leave it up to [place] to determine the best course of action with dosages. She would just like something that will help the medication stay in her system to help give her comfort.

## 2023-07-14 NOTE — Telephone Encounter (Signed)
 Danielle sent an rx for 200mg  TID. Lauren, RN notified pt at previous phone call that an rx would be sent by end of day.

## 2023-08-20 ENCOUNTER — Other Ambulatory Visit: Payer: Self-pay | Admitting: Neurosurgery

## 2023-08-21 NOTE — Telephone Encounter (Signed)
 LMOM informed patient medication is sent to pharmacy.

## 2023-09-09 IMAGING — MG MM DIGITAL SCREENING BILAT W/ TOMO AND CAD
8 of 14 series · 8 of 40 positions shown · non-contrast
Comparison: Previous exam(s).

CLINICAL DATA: Screening.

EXAM:
DIGITAL SCREENING BILATERAL MAMMOGRAM WITH TOMOSYNTHESIS AND CAD
TECHNIQUE: Bilateral screening digital craniocaudal and mediolateral oblique
mammograms were obtained. Bilateral screening digital breast
tomosynthesis was performed. The images were evaluated with
computer-aided detection.

[R MLO synth-2D (1 of 2)]
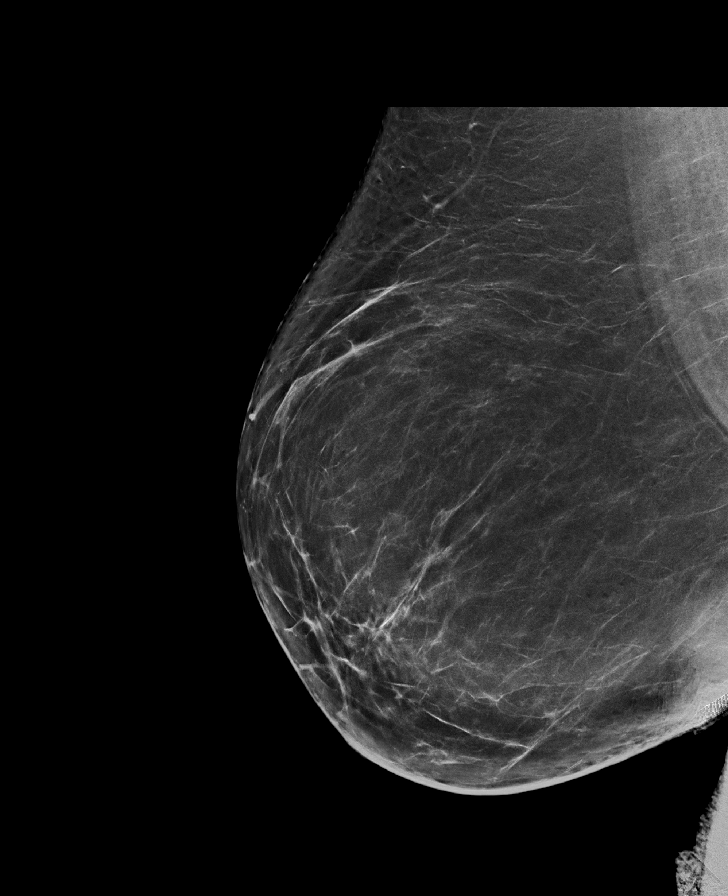

[L CC synth-2D (1 of 2)]
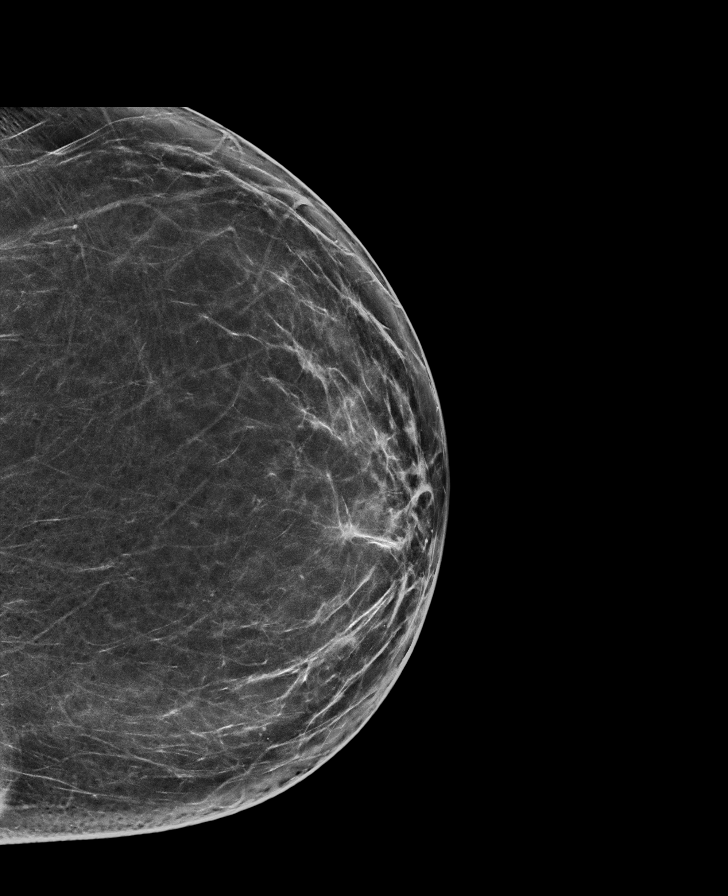

[R CC synth-2D (1 of 2)]
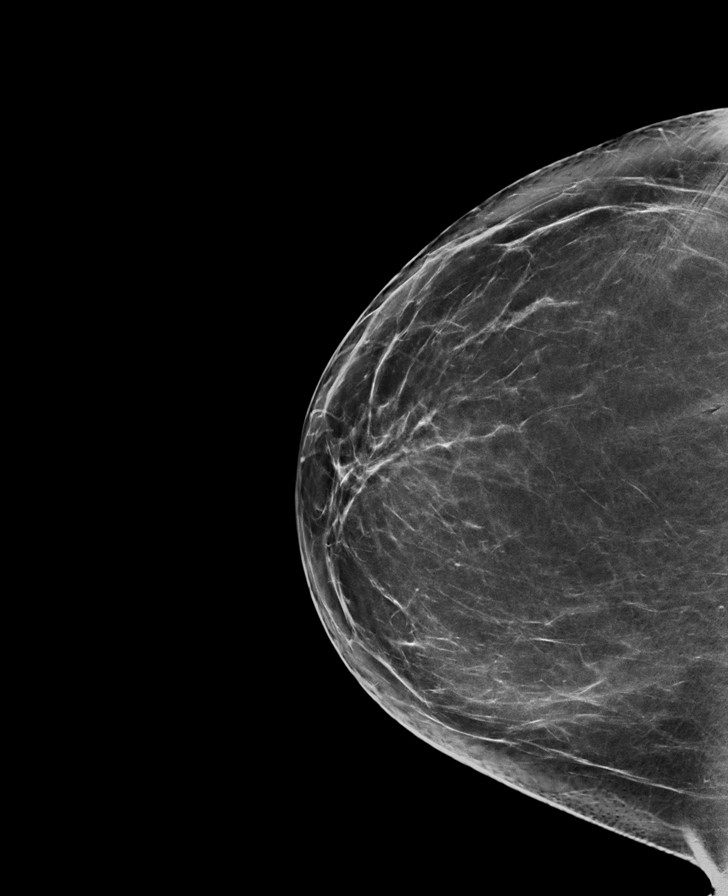

[L CC synth-2D (2 of 2)]
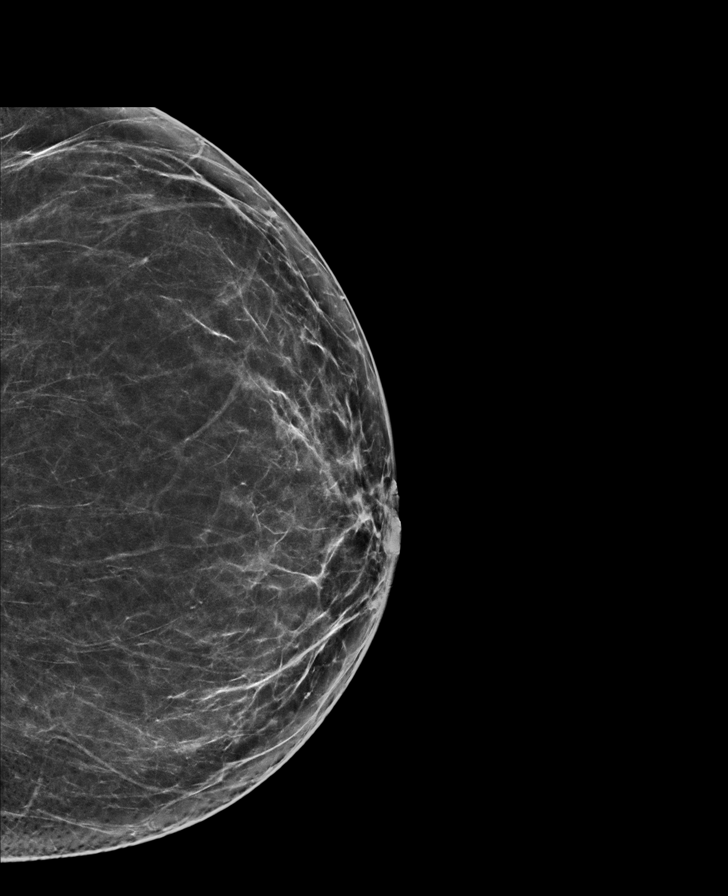

[L MLO synth-2D]
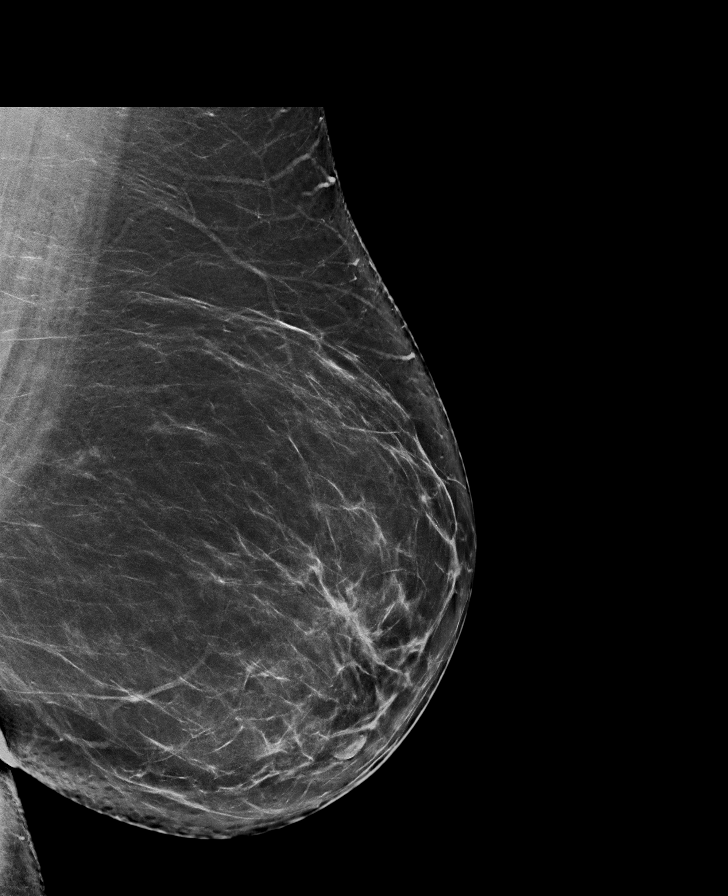

[R MLO synth-2D (2 of 2)]
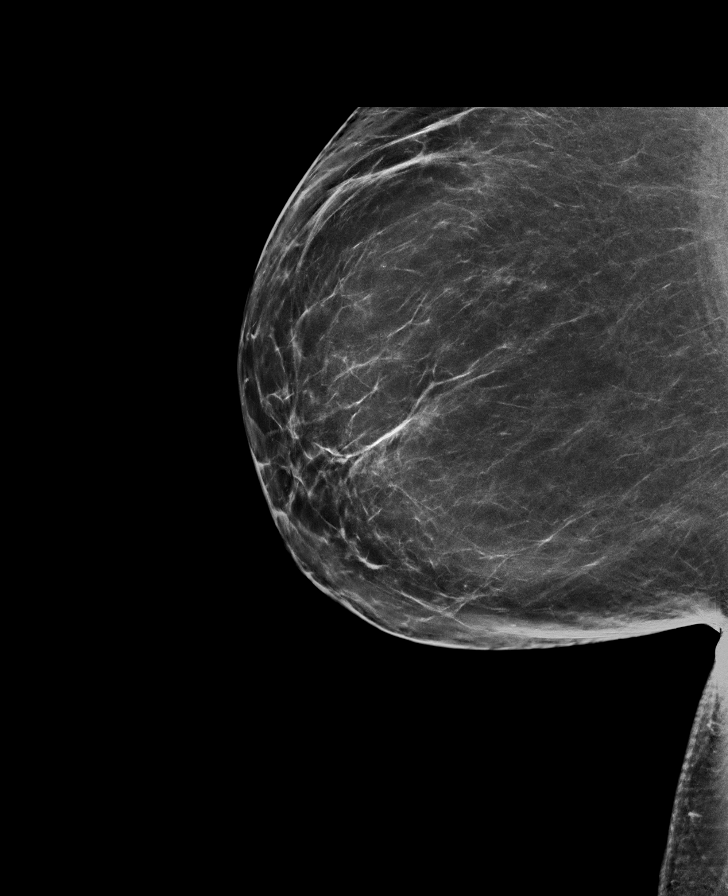

[R CC synth-2D (2 of 2)]
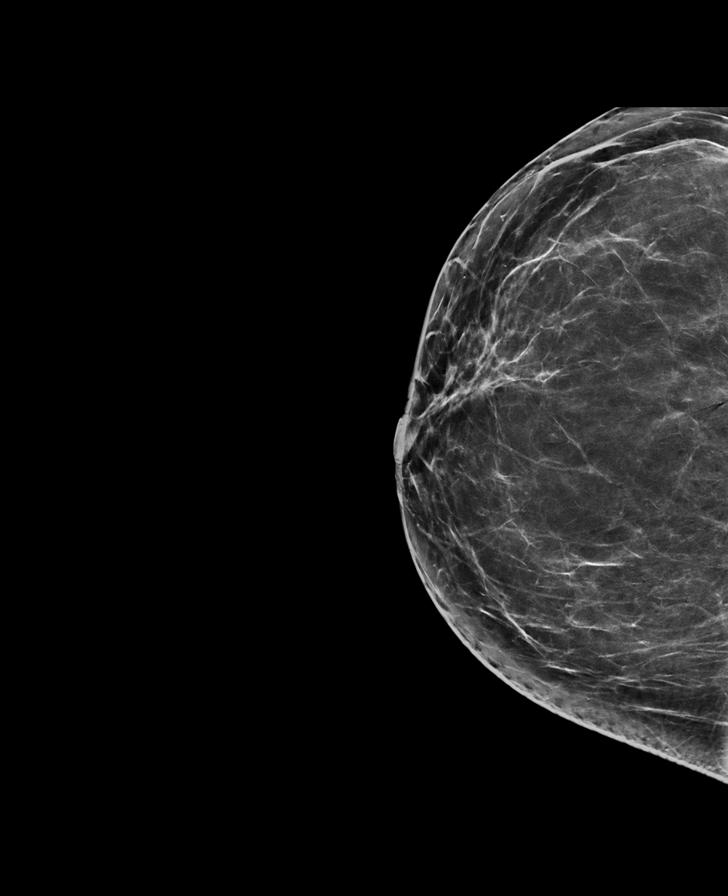

[R CC tomo · tomo slice 46/91.0]
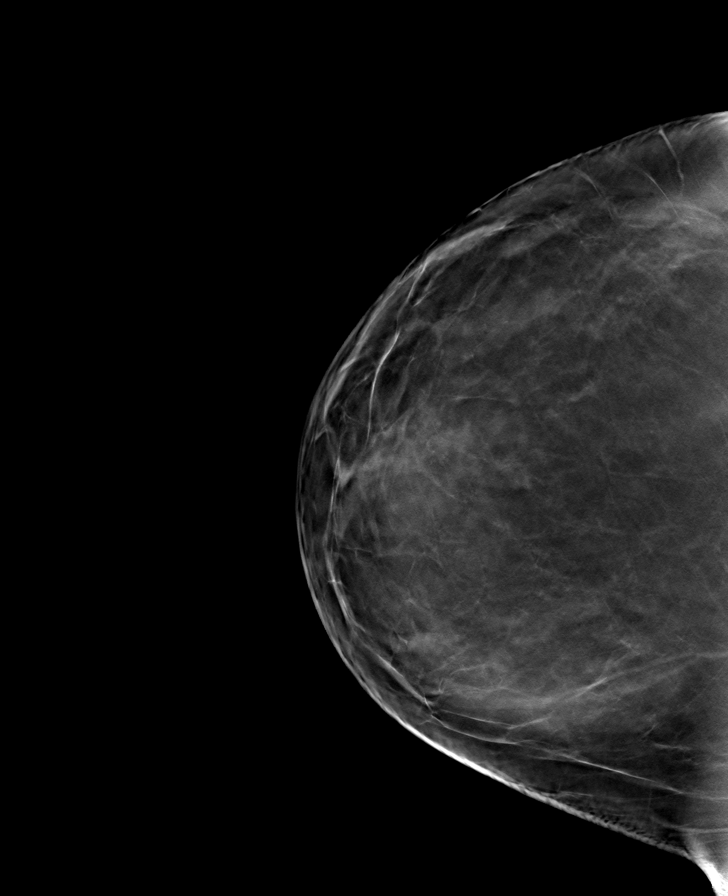

[8 of 40 positions shown; findings below may reference images not displayed]

ACR Breast Density Category b: There are scattered areas of
fibroglandular density.
FINDINGS: There are no findings suspicious for malignancy.
IMPRESSION: No mammographic evidence of malignancy. A result letter of this
screening mammogram will be mailed directly to the patient.

RECOMMENDATION:
Screening mammogram in one year. (Code:51-O-LD2)

BI-RADS CATEGORY  1: Negative.

## 2023-09-12 ENCOUNTER — Ambulatory Visit (INDEPENDENT_AMBULATORY_CARE_PROVIDER_SITE_OTHER): Payer: Medicaid Other | Admitting: Neurosurgery

## 2023-09-12 DIAGNOSIS — M961 Postlaminectomy syndrome, not elsewhere classified: Secondary | ICD-10-CM

## 2023-09-12 NOTE — Progress Notes (Signed)
   DOS: 06/15/2022 (L L3/4 Microdiscectomy) 07/01/2022 (I and D) 07/21/2022 (Reexploration and CSF leak repair by Dr. Celestia Colander)  HISTORY OF PRESENT ILLNESS: 09/12/2023 Marie Mills returns for telephone visit.  She continues to have issues despite maximizing her dose of pregabalin .  06/15/2023 She continues to have symptoms.  03/28/2023 Marie Mills continues to have numbness in the stripe down the back of her left leg and in her private area on the left side.  It bothers her to urinate and defecate, though she can feel and control both.  She is back at work.  11/01/2022 She has seen some improvement in her L leg symptoms since starting pregabalin .  However, she still has symptoms that preclude her from sleeping in the bed at night.  09/20/2022 Marie Mills is status post lumbar microdiscectomy complicated by an occult spinal fluid leak, wound infection, and necessity for CSF leak repair in delayed fashion.  Her acute severe pain is improved.  She continues to have burning and irritating numbness in her left leg in a similar distribution.  She also describes some numbness in her private area.  She has difficulty with some of the activities which used to not be an issue for her.  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   PHYSICAL EXAMINATION:   There were no vitals filed for this visit.    ROS (Neurologic): Negative except as noted above  IMAGING: MRI L spine 10/21/2022 IMPRESSION: 1. Interval surgery at L3-4 with enlarging nonspecific fluid collection within the laminectomy bed but no significant residual mass effect on the thecal sac. 2. Additional new fluid collection within the midline subcutaneous fat demonstrates mild peripheral enhancement and could reflect a seroma or small abscess. 3. No evidence of discitis or osteomyelitis. 4. The previously demonstrated central disc protrusion at L4-5 has partially involuted with improved patency  of the thecal sac and lateral recesses. 5. Stable mild foraminal narrowing bilaterally at L3-4 and L4-5.     Electronically Signed   By: Elmon Hagedorn M.D.   On: 10/29/2022 16:35  EMG nerve conduction study from September 22, 2022 shows normal findings.  ASSESSMENT/PLAN:  Marie Mills is doing better, but still has symptoms of chronic radiculopathy.  She has postlaminectomy syndrome.  At this point, her pregabalin  is not controlling her symptoms.  I recommended that she consider spinal cord stimulator evaluation.  She is agreeable to this.  This visit was performed via telephone.  Patient location: home Provider location: office  I spent a total of 4 minutes non-face-to-face activities for this visit on the date of this encounter including review of current clinical condition and response to treatment.  The patient is aware of and accepts the limits of this telehealth visit.  Jodeen Munch MD, [facility] Department of Neurosurgery

## 2023-09-16 ENCOUNTER — Other Ambulatory Visit: Payer: Self-pay | Admitting: Orthopedic Surgery

## 2023-09-20 ENCOUNTER — Telehealth: Payer: Self-pay | Admitting: Neurosurgery

## 2023-09-20 DIAGNOSIS — M961 Postlaminectomy syndrome, not elsewhere classified: Secondary | ICD-10-CM

## 2023-09-20 MED ORDER — PREGABALIN 200 MG PO CAPS: ORAL_CAPSULE | ORAL | 0 refills | Status: DC

## 2023-09-20 NOTE — Telephone Encounter (Signed)
 Did we receive a request from [facility] in [place] for her  Pregablin 3 times a day?

## 2023-09-20 NOTE — Telephone Encounter (Signed)
 No this medication was sent on 08/21/2023 90 tablets no refills.

## 2023-09-20 NOTE — Telephone Encounter (Signed)
She needs a refill

## 2023-09-20 NOTE — Telephone Encounter (Signed)
 Did phone visit with Dr. Mont Antis on 5/27- workup for SCS.   Refill of lyrica  sent to pharmacy. PMP reviewed and is appropriate.   Please let her know.

## 2023-09-30 ENCOUNTER — Ambulatory Visit
Admission: RE | Admit: 2023-09-30 | Discharge: 2023-09-30 | Disposition: A | Discharge status: Home / Self Care | Source: Ambulatory Visit | Attending: Neurosurgery | Admitting: Neurosurgery

## 2023-09-30 DIAGNOSIS — M961 Postlaminectomy syndrome, not elsewhere classified: Secondary | ICD-10-CM

## 2023-10-23 ENCOUNTER — Telehealth: Payer: Self-pay | Admitting: Neurosurgery

## 2023-10-23 ENCOUNTER — Telehealth: Payer: Self-pay | Admitting: Orthopedic Surgery

## 2023-10-23 DIAGNOSIS — M961 Postlaminectomy syndrome, not elsewhere classified: Secondary | ICD-10-CM

## 2023-10-23 NOTE — Telephone Encounter (Signed)
 Prescription Request  10/23/2023  LOV: 09/12/2023  What is the name of the medication or equipment? pregabalin  (LYRICA ) 200 MG capsule   Have you contacted your pharmacy to request a refill? No   Which pharmacy would you like this sent to?  [id number] - [place], [place] - [address] [place] [address] Phone: [phone] Fax: [phone]  [facility] [address] [place] - [address] [place] [address] [place] [address] [place] [address] Phone: [phone] Fax: [phone]    Patient notified that their request is being sent to the clinical staff for review and that they should receive a response within 2 business days.   Please advise at [facility] [phone]   [facility]

## 2023-10-24 NOTE — Telephone Encounter (Signed)
 Refill of lyrica  done under a different encounter.

## 2023-10-24 NOTE — Telephone Encounter (Signed)
 Has appt with Lateef to discuss SCS and then will likely follow back with us .   PMP reviewed and is appropriate. Refill of lyrica  sent to pharmacy.   Please let her know.

## 2023-11-01 ENCOUNTER — Ambulatory Visit: Admitting: Adult Health

## 2023-11-02 ENCOUNTER — Ambulatory Visit: Admitting: Student in an Organized Health Care Education/Training Program

## 2023-11-02 ENCOUNTER — Ambulatory Visit: Admitting: Adult Health

## 2023-11-02 ENCOUNTER — Encounter: Payer: Self-pay | Admitting: Adult Health

## 2023-11-02 VITALS — BP 130/81 | HR 99 | Ht 66.0 in | Wt 259.0 lb

## 2023-11-02 DIAGNOSIS — R232 Flushing: Secondary | ICD-10-CM | POA: Diagnosis not present

## 2023-11-02 DIAGNOSIS — N926 Irregular menstruation, unspecified: Secondary | ICD-10-CM

## 2023-11-02 DIAGNOSIS — I10 Essential (primary) hypertension: Secondary | ICD-10-CM

## 2023-11-02 DIAGNOSIS — R4589 Other symptoms and signs involving emotional state: Secondary | ICD-10-CM | POA: Diagnosis not present

## 2023-11-02 MED ORDER — MEGESTROL ACETATE 40 MG/ML PO SUSP: ORAL | 1 refills | Status: DC

## 2023-11-02 NOTE — Progress Notes (Signed)
 Subjective:     Patient ID: Marie Mills, female   DOB: July 01, 1970, 53 y.o.   MRN: [id number]  HPI Mi is a 53 year old white female,divorced, [id number] in complaining of irregular bleeding on BCP, has bleed since 10/05/23, is moody and having hot flashes. She is finally working on first shift at [employer].     Component Value Date/Time   DIAGPAP - Low grade squamous intraepithelial lesion (LSIL) (A) 05/18/2023 0849   DIAGPAP  02/03/2021 1454    - Negative for intraepithelial lesion or malignancy (NILM)   DIAGPAP  05/30/2016 0000    NEGATIVE FOR INTRAEPITHELIAL LESIONS OR MALIGNANCY.   HPVHIGH Negative 05/18/2023 0849   HPVHIGH Negative 02/03/2021 1454   ADEQPAP  05/18/2023 0849    Satisfactory for evaluation; transformation zone component PRESENT.   ADEQPAP  02/03/2021 1454    Satisfactory for evaluation; transformation zone component ABSENT.   ADEQPAP  05/30/2016 0000    Satisfactory for evaluation  endocervical/transformation zone component PRESENT.  PCP is Dr Orpha   Review of Systems +irregular bleeding on BCP, has bleed since 10/05/23,  +moody  + hot flashes.   Not currently having sex.  Reviewed past medical,surgical, social and family history. Reviewed medications and allergies.  Objective:   Physical Exam BP 130/81 (BP Location: Left Arm, Patient Position: Sitting, Cuff Size: Large)   Pulse 99   Ht 5' 6 (1.676 m)   Wt 259 lb (117.5 kg)   LMP 10/05/2023 (Approximate)   BMI 41.80 kg/m     Skin warm and dry.Pelvic: external genitalia is normal in appearance no lesions, vagina: pink, no blood now but has had tampon in,urethra has no lesions or masses noted, cervix:smooth and bulbous, uterus: normal size, shape and contour, non tender, no masses felt, adnexa: no masses or tenderness noted. Bladder is non tender and no masses felt.  Fall risk is low    11/02/2023    3:13 PM 05/18/2023    8:49 AM 09/01/2022    9:12 AM  Depression screen PHQ 2/9  Decreased Interest 1  0 0  Down, Depressed, Hopeless 1 0 0  PHQ - 2 Score 2 0 0  Altered sleeping 0 0   Tired, decreased energy 0 1   Change in appetite 0 1   Feeling bad or failure about yourself  0 1   Trouble concentrating 0 0   Moving slowly or fidgety/restless 0 0   Suicidal thoughts 0 0   PHQ-9 Score 2 3        11/02/2023    3:16 PM 05/18/2023    8:49 AM 02/03/2021    2:55 PM  GAD 7 : Generalized Anxiety Score  Nervous, Anxious, on Edge 0 0 3  Control/stop worrying 0 0 2  Worry too much - different things 1 0 3  Trouble relaxing 0 0 2  Restless 0 0 0  Easily annoyed or irritable 1 0 1  Afraid - awful might happen 0 0 0  Total GAD 7 Score 2 0 11      Upstream - 11/02/23 1512       Pregnancy Intention Screening   Does the patient want to become pregnant in the next year? No    Does the patient's partner want to become pregnant in the next year? No    Would the patient like to discuss contraceptive options today? No      Contraception Wrap Up   Current Method Abstinence;Oral Contraceptive    End Method  Abstinence    Contraception Counseling Provided Yes         Examination chaperoned by Clarita Salt LPN  Assessment:     1. Irregular bleeding (Primary) Bleeding since 10/05/23 Stop BCP  Will rx megace  to stop bleeding No sex Meds ordered this encounter  Medications   megestrol  (MEGACE ) 40 MG/ML suspension    Sig: Take 3 ml x 5 days then 2 ml x 5 days and then 1 ml daily til bleeding stops.    Dispense:  120 mL    Refill:  1    Supervising Provider:   JAYNE MINDER H [[id number]]   Discussed probably menopausal Will follow up in 3 weeks for ROS , may restart low dose COC then   2. [place] at times   3. Hot flashes +hot flashes   4. Essential hypertension Take BP meds and follow up with PCP     Plan:     Follow up in 3 weeks for ROS

## 2023-11-25 ENCOUNTER — Other Ambulatory Visit: Payer: Self-pay | Admitting: Orthopedic Surgery

## 2023-11-28 ENCOUNTER — Ambulatory Visit: Admitting: Adult Health

## 2023-11-28 ENCOUNTER — Encounter: Payer: Self-pay | Admitting: Adult Health

## 2023-11-28 VITALS — BP 138/86 | HR 102 | Ht 66.0 in | Wt 255.5 lb

## 2023-11-28 DIAGNOSIS — N926 Irregular menstruation, unspecified: Secondary | ICD-10-CM

## 2023-11-28 DIAGNOSIS — R232 Flushing: Secondary | ICD-10-CM

## 2023-11-28 NOTE — Progress Notes (Signed)
  Subjective:     Patient ID: Marie Mills, female   DOB: [birth year], 53 y.o.   MRN: [id number]  HPI Marie Mills is a 53 year old white female,divorced, [id number] back in follow up on taking megace  for bleeding and the bleeding has stopped, and she stopped the megace  11/21/23.      Component Value Date/Time   DIAGPAP - Low grade squamous intraepithelial lesion (LSIL) (A) 05/18/2023 0849   DIAGPAP  02/03/2021 1454    - Negative for intraepithelial lesion or malignancy (NILM)   DIAGPAP  05/30/2016 0000    NEGATIVE FOR INTRAEPITHELIAL LESIONS OR MALIGNANCY.   HPVHIGH Negative 05/18/2023 0849   HPVHIGH Negative 02/03/2021 1454   ADEQPAP  05/18/2023 0849    Satisfactory for evaluation; transformation zone component PRESENT.   ADEQPAP  02/03/2021 1454    Satisfactory for evaluation; transformation zone component ABSENT.   ADEQPAP  05/30/2016 0000    Satisfactory for evaluation  endocervical/transformation zone component PRESENT.    PCP is Dr Orpha   Review of Systems Bleeding stopped with megace  +hot flashes  Not having sex Reviewed past medical,surgical, social and family history. Reviewed medications and allergies.     Objective:   Physical Exam BP 138/86 (BP Location: Left Arm, Patient Position: Sitting, Cuff Size: Large)   Pulse (!) 102   Ht 5' 6 (1.676 m)   Wt 255 lb 8 oz (115.9 kg)   LMP 10/05/2023 (Approximate)   BMI 41.24 kg/m     Skin warm and dry. Lungs: clear to ausculation bilaterally. Cardiovascular: regular rate and rhythm.   Upstream - 11/28/23 1614       Pregnancy Intention Screening   Does the patient want to become pregnant in the next year? No    Does the patient's partner want to become pregnant in the next year? No    Would the patient like to discuss contraceptive options today? No      Contraception Wrap Up   Current Method Abstinence    End Method Abstinence          Assessment:     1. Irregular bleeding (Primary) Bleeding stopped with  megace  Will wait to see if bleeds anymore  2. Hot flashes Has hot flashes, will follow for now    Plan:     Follow up prn

## 2023-12-08 ENCOUNTER — Encounter: Payer: Self-pay | Admitting: Radiology

## 2023-12-31 ENCOUNTER — Telehealth: Admitting: Family Medicine

## 2023-12-31 ENCOUNTER — Telehealth: Admitting: Physician Assistant

## 2023-12-31 DIAGNOSIS — J019 Acute sinusitis, unspecified: Secondary | ICD-10-CM

## 2023-12-31 DIAGNOSIS — J329 Chronic sinusitis, unspecified: Secondary | ICD-10-CM

## 2023-12-31 MED ORDER — DOXYCYCLINE HYCLATE 100 MG PO TABS: 100.0000 mg | ORAL_TABLET | Freq: Two times a day (BID) | ORAL | 0 refills | Status: AC

## 2023-12-31 NOTE — Patient Instructions (Signed)
 Marie Mills, thank you for joining Harlene PEDLAR Ward, PA-C for today's virtual visit.  While this provider is not your primary care provider (PCP), if your PCP is located in our provider database this encounter information will be shared with them immediately following your visit.   A [facility] MyChart account gives you access to today's visit and all your visits, tests, and labs performed at [facility]  click here if you don't have a [facility] MyChart account or go to [email or web address]  Consent: (Patient) Marie Mills provided verbal consent for this virtual visit at the beginning of the encounter.  Current Medications:  Current Outpatient Medications:    doxycycline  (VIBRA -TABS) 100 MG tablet, Take 1 tablet (100 mg total) by mouth 2 (two) times daily for 7 days., Disp: 14 tablet, Rfl: 0   albuterol  (VENTOLIN  HFA) 108 (90 Base) MCG/ACT inhaler, Inhale 2 puffs into the lungs every 4 (four) hours as needed for wheezing or shortness of breath., Disp: 18 g, Rfl: 0   amLODipine  (NORVASC ) 2.5 MG tablet, Take 2.5 mg by mouth daily., Disp: , Rfl:    cetirizine  (ZYRTEC ) 10 MG tablet, Take 1 tablet (10 mg total) by mouth daily., Disp: 30 tablet, Rfl: 0   Cyanocobalamin (B-12) 100 MCG TABS, Take 1 tablet by mouth daily., Disp: , Rfl:    famotidine (PEPCID) 40 MG tablet, Take 40 mg by mouth daily., Disp: , Rfl:    fluticasone (FLONASE) 50 MCG/ACT nasal spray, Place 1 spray into both nostrils daily as needed., Disp: , Rfl:    hydrOXYzine  (VISTARIL ) 25 MG capsule, Take 25 mg by mouth 3 (three) times daily as needed., Disp: , Rfl:    ipratropium (ATROVENT) 0.06 % nasal spray, Place 2 sprays into both nostrils 4 (four) times daily as needed., Disp: , Rfl:    megestrol  (MEGACE ) 40 MG/ML suspension, Take 3 ml x 5 days then 2 ml x 5 days and then 1 ml daily til bleeding stops. (Patient not taking: Reported on 11/28/2023), Disp: 120 mL, Rfl: 1   metoprolol  succinate (TOPROL -XL) 25 MG 24  hr tablet, Take 25 mg by mouth daily., Disp: , Rfl:    Misc Natural Products (ELDERBERRY IMMUNE COMPLEX PO), Take 2 tablets by mouth daily., Disp: , Rfl:    montelukast (SINGULAIR) 10 MG tablet, Take 10 mg by mouth daily., Disp: , Rfl:    olmesartan (BENICAR) 40 MG tablet, Take 40 mg by mouth daily., Disp: , Rfl:    omeprazole  (PRILOSEC) 40 MG capsule, TK 1 C PO QD, Disp: 30 capsule, Rfl: 6   Pediatric Multiple Vitamins (FLINSTONES GUMMIES OMEGA-3 DHA PO), Take by mouth., Disp: , Rfl:    phentermine  (ADIPEX-P ) 37.5 MG tablet, Take 37.5 mg by mouth every morning., Disp: , Rfl:    Potassium 99 MG TABS, Take 1 tablet by mouth daily., Disp: , Rfl:    pregabalin  (LYRICA ) 200 MG capsule, Take 200 mg by mouth 3 (three) times daily., Disp: , Rfl:    Pyridoxine HCl (B-6 PO), Take 1 tablet by mouth daily., Disp: , Rfl:    sulindac  (CLINORIL ) 150 MG tablet, TAKE 1 TABLET(150 MG) BY MOUTH TWICE DAILY, Disp: 60 tablet, Rfl: 1   tiZANidine  (ZANAFLEX ) 4 MG tablet, Take 4 mg by mouth at bedtime., Disp: , Rfl:    Medications ordered in this encounter:  Meds ordered this encounter  Medications   doxycycline  (VIBRA -TABS) 100 MG tablet    Sig: Take 1 tablet (100 mg total) by mouth 2 (two)  times daily for 7 days.    Dispense:  14 tablet    Refill:  0    Supervising Provider:   BLAISE ALEENE KIDD [[id number]]     *If you need refills on other medications prior to your next appointment, please contact your pharmacy*  Follow-Up: Call back or seek an in-person evaluation if the symptoms worsen or if the condition fails to improve as anticipated.  [facility] [phone]  Other Instructions Take antibiotic as prescribed.  If no improvement please be seen in person at Urgent Care or with your Primary Care Physician.     If you have been instructed to have an in-person evaluation today at a local Urgent Care facility, please use the link below. It will take you to a list of all of our available  [facility], including address, phone number and hours of operation. Please do not delay care.  [facility]  If you or a family member do not have a primary care provider, use the link below to schedule a visit and establish care. When you choose a [facility] primary care physician or advanced practice provider, you gain a long-term partner in health. Find a Primary Care Provider  Learn more about [facility]'s in-office and virtual care options: [facility] - Get Care Now

## 2023-12-31 NOTE — Progress Notes (Signed)
 Virtual Visit Consent   [name], you are scheduled for a virtual visit with a [facility] provider today. Just as with appointments in the office, your consent must be obtained to participate. Your consent will be active for this visit and any virtual visit you may have with one of our providers in the next 365 days. If you have a MyChart account, a copy of this consent can be sent to you electronically.  As this is a virtual visit, video technology does not allow for your provider to perform a traditional examination. This may limit your provider's ability to fully assess your condition. If your provider identifies any concerns that need to be evaluated in person or the need to arrange testing (such as labs, EKG, etc.), we will make arrangements to do so. Although advances in technology are sophisticated, we cannot ensure that it will always work on either your end or our end. If the connection with a video visit is poor, the visit may have to be switched to a telephone visit. With either a video or telephone visit, we are not always able to ensure that we have a secure connection.  By engaging in this virtual visit, you consent to the provision of healthcare and authorize for your insurance to be billed (if applicable) for the services provided during this visit. Depending on your insurance coverage, you may receive a charge related to this service.  I need to obtain your verbal consent now. Are you willing to proceed with your visit today? Marie Mills has provided verbal consent on 12/31/2023 for a virtual visit (video or telephone). Harlene PEDLAR Ward, PA-C  Date: 12/31/2023 12:43 PM   Virtual Visit via Video Note   I, Harlene PEDLAR Ward, connected with  Marie Mills  ([id number], 1970-08-24) on 12/31/23 at 12:45 PM EDT by a video-enabled telemedicine application and verified that I am speaking with the correct person using two identifiers.  Location: Patient: Virtual Visit Location Patient:  Home Provider: Virtual Visit Location Provider: Home Office   I discussed the limitations of evaluation and management by telemedicine and the availability of in person appointments. The patient expressed understanding and agreed to proceed.    History of Present Illness: Marie Mills is a 53 y.o. who identifies as a female who was assigned female at birth, and is being seen today for sinus pressure congestion, productive cough.  She reports postnasal drip.  She denies fever, shortness of breath, wheezing. She is currently using theraflu. She taking zyrtec  and sinuglair.  She reports recurrent sinus infections, last one about three months ago.  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   H/O microdiscectomy 06/28/2022   Lumbar disc herniation 06/15/2022   Encounter for screening fecal occult blood testing 03/23/2022   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) 02/15/2022   Lumbar radiculopathy 02/15/2022   Spinal stenosis of lumbar region with neurogenic claudication 02/15/2022   Chronic pain syndrome 02/15/2022   Acute bilateral low back pain with bilateral sciatica 12/30/2021   Headache above the eye region 12/24/2021   Recurrent sinusitis 12/24/2021   Skin excoriation 12/14/2021   Superficial fungus infection of  skin 12/06/2021   Vaginal yeast infection 12/06/2021   Vaginal itching 12/06/2021   Vaginal burning 12/06/2021   Visit for suture removal 12/02/2021   Burning with urination 11/16/2021   History of sinusitis 11/12/2021   Nasal septal deviation 11/12/2021   Pain and swelling of right knee 10/29/2021   Encounter for  screening colonoscopy 06/29/2021   Screening examination for STD (sexually transmitted disease) 05/06/2021   Epidermal cyst 05/06/2021   Screening mammogram for breast cancer 02/03/2021   Routine general medical examination at a health care facility 02/03/2021   Encounter for gynecological examination with Papanicolaou smear of cervix 02/03/2021   Encounter for surveillance of contraceptive pills 02/03/2021   Anxiety and depression 02/03/2021   Weight gain 07/04/2019   Body mass index 39.0-39.9, adult 05/28/2019   Body mass index 37.0-37.9, adult 04/08/2019   Body mass index 38.0-38.9, adult 02/04/2019   Body mass index 40.0-44.9, adult (HCC) 11/01/2018   Encounter for well woman exam with routine gynecological exam 10/08/2018   Weight loss counseling, encounter for 10/08/2018   Body mass index 45.0-49.9, adult (HCC) 10/08/2018   LGSIL on Pap smear of cervix 05/30/2016   Surveillance for birth control, oral contraceptives 05/30/2016   Family planning 05/30/2016   Screening for colorectal cancer 05/30/2016   Vaginal irritation 08/29/2013   BV (bacterial vaginosis) 08/29/2013   Essential hypertension 03/21/2013   Contraceptive management 03/21/2013    Allergies:  Allergies  Allergen Reactions   Penicillins Anaphylaxis    Has patient had a PCN reaction causing immediate rash, facial/tongue/throat swelling, SOB or lightheadedness with hypotension: Yes Has patient had a PCN reaction causing severe rash involving mucus membranes or skin necrosis: No Has patient had a PCN reaction that required hospitalization Yes Has patient had a PCN reaction occurring within the last 10 years: No If all of the above answers are NO, then may proceed with Cephalosporin use.    Codeine Other (See Comments)    Chest Pain   Sulfa Antibiotics Hives   Medications:  Current Outpatient Medications:    albuterol  (VENTOLIN  HFA) 108 (90 Base) MCG/ACT inhaler, Inhale 2 puffs into the lungs every 4 (four)  hours as needed for wheezing or shortness of breath., Disp: 18 g, Rfl: 0   amLODipine  (NORVASC ) 2.5 MG tablet, Take 2.5 mg by mouth daily., Disp: , Rfl:    cetirizine  (ZYRTEC ) 10 MG tablet, Take 1 tablet (10 mg total) by mouth daily., Disp: 30 tablet, Rfl: 0   Cyanocobalamin (B-12) 100 MCG TABS, Take 1 tablet by mouth daily., Disp: , Rfl:    doxycycline  (VIBRA -TABS) 100 MG tablet, Take 1 tablet (100 mg total) by mouth 2 (two) times daily for 7 days., Disp: 14 tablet, Rfl: 0   famotidine (PEPCID) 40 MG tablet, Take 40 mg by mouth daily., Disp: , Rfl:    fluticasone (FLONASE) 50 MCG/ACT nasal spray, Place 1 spray into both nostrils daily as needed., Disp: , Rfl:    hydrOXYzine  (VISTARIL ) 25 MG capsule, Take 25 mg by mouth 3 (three) times daily as needed., Disp: , Rfl:    ipratropium (ATROVENT) 0.06 % nasal spray, Place 2 sprays into both nostrils 4 (four) times daily as needed., Disp: , Rfl:    megestrol  (MEGACE ) 40 MG/ML suspension, Take 3 ml x 5 days then 2 ml x 5 days and then 1 ml daily til bleeding stops. (Patient not taking: Reported on 11/28/2023), Disp: 120 mL, Rfl: 1   metoprolol  succinate (TOPROL -XL) 25 MG 24 hr tablet, Take  25 mg by mouth daily., Disp: , Rfl:    Misc Natural Products (ELDERBERRY IMMUNE COMPLEX PO), Take 2 tablets by mouth daily., Disp: , Rfl:    montelukast (SINGULAIR) 10 MG tablet, Take 10 mg by mouth daily., Disp: , Rfl:    olmesartan (BENICAR) 40 MG tablet, Take 40 mg by mouth daily., Disp: , Rfl:    omeprazole  (PRILOSEC) 40 MG capsule, TK 1 C PO QD, Disp: 30 capsule, Rfl: 6   Pediatric Multiple Vitamins (FLINSTONES GUMMIES OMEGA-3 DHA PO), Take by mouth., Disp: , Rfl:    phentermine  (ADIPEX-P ) 37.5 MG tablet, Take 37.5 mg by mouth every morning., Disp: , Rfl:    Potassium 99 MG TABS, Take 1 tablet by mouth daily., Disp: , Rfl:    pregabalin  (LYRICA ) 200 MG capsule, Take 200 mg by mouth 3 (three) times daily., Disp: , Rfl:    Pyridoxine HCl (B-6 PO), Take 1 tablet by  mouth daily., Disp: , Rfl:    sulindac  (CLINORIL ) 150 MG tablet, TAKE 1 TABLET(150 MG) BY MOUTH TWICE DAILY, Disp: 60 tablet, Rfl: 1   tiZANidine  (ZANAFLEX ) 4 MG tablet, Take 4 mg by mouth at bedtime., Disp: , Rfl:   Observations/Objective: Patient is well-developed, well-nourished in no acute distress.  Resting comfortably  at home.  Head is normocephalic, atraumatic.  No labored breathing.  Speech is clear and coherent with logical content.  Patient is alert and oriented at baseline.    Assessment and Plan: 1. Acute non-recurrent sinusitis, unspecified location (Primary)  Will treat for acute sinusitis.  Supportive care discussed.   Follow Up Instructions: I discussed the assessment and treatment plan with the patient. The patient was provided an opportunity to ask questions and all were answered. The patient agreed with the plan and demonstrated an understanding of the instructions.  A copy of instructions were sent to the patient via MyChart unless otherwise noted below.     The patient was advised to call back or seek an in-person evaluation if the symptoms worsen or if the condition fails to improve as anticipated.    Harlene PEDLAR Ward, PA-C

## 2023-12-31 NOTE — Progress Notes (Signed)
   Thank you for the details you included in the comment boxes. Those details are very helpful in determining the best course of treatment for you and help us  to provide the best care.Because Ms Tischer, we recommend that you schedule a Virtual Urgent Care video visit in order for the provider to better assess what is going on.  The provider will be able to give you a more accurate diagnosis and treatment plan if we can more freely discuss your symptoms and with the addition of a virtual examination.   If you change your visit to a video visit, we will bill your insurance (similar to an office visit) and you will not be charged for this e-Visit. You will be able to stay at home and speak with the first available [facility] advanced practice provider. The link to do a video visit is in the drop down Menu tab of your Welcome screen in MyChart.    have provided 5 minutes of non face to face time during this encounter for chart review and documentation.

## 2024-01-01 ENCOUNTER — Telehealth: Payer: Self-pay | Admitting: Neurosurgery

## 2024-01-01 MED ORDER — PREGABALIN 200 MG PO CAPS: 200.0000 mg | ORAL_CAPSULE | Freq: Three times a day (TID) | ORAL | 0 refills | Status: DC

## 2024-01-01 NOTE — Telephone Encounter (Signed)
 Refill of lyrica  sent to pharmacy. PMP reviewed and is appropriate.   Last visit 09/12/23 and she does not have f/u scheduled. No showed her visit with Dr. Marcelino.   Please let her know she needs to schedule f/u with us  prior to next refill. Other alternative would be having her PCP take over prescribing the lyrica .

## 2024-01-01 NOTE — Telephone Encounter (Signed)
 Patient is calling to request a refill of Pregabalin  be sent to [facility] on [address] in [place].

## 2024-01-01 NOTE — Telephone Encounter (Signed)
 Left message to call back.

## 2024-01-02 NOTE — Telephone Encounter (Signed)
 Called patient again today to discuss no answer. Sent [employer].

## 2024-01-02 NOTE — Telephone Encounter (Signed)
 Patient left a message with the answering service that she was returning your call.

## 2024-02-02 ENCOUNTER — Telehealth: Payer: Self-pay | Admitting: Neurosurgery

## 2024-02-02 DIAGNOSIS — Z9889 Other specified postprocedural states: Secondary | ICD-10-CM

## 2024-02-02 DIAGNOSIS — M961 Postlaminectomy syndrome, not elsewhere classified: Secondary | ICD-10-CM

## 2024-02-02 DIAGNOSIS — M5416 Radiculopathy, lumbar region: Secondary | ICD-10-CM

## 2024-02-02 MED ORDER — PREGABALIN 200 MG PO CAPS: 200.0000 mg | ORAL_CAPSULE | Freq: Three times a day (TID) | ORAL | 0 refills | Status: DC

## 2024-02-02 NOTE — Telephone Encounter (Signed)
 Refill of lyrica  okay. She has f/u scheduled.   PMP reviewed and is appropriate.   Please let her know lyrica  was sent to pharmacy.

## 2024-02-02 NOTE — Telephone Encounter (Signed)
 Patient is calling to request a refill of Lyrica  be sent to [facility] in [place] on [address]. She is scheduled for a telephone visit for a recheck on 02/09/2024. She was also provided the number to Dr. Charolotte to reschedule her missed appointment.

## 2024-02-05 ENCOUNTER — Other Ambulatory Visit: Payer: Self-pay | Admitting: Orthopedic Surgery

## 2024-02-06 ENCOUNTER — Other Ambulatory Visit: Payer: Self-pay | Admitting: Orthopedic Surgery

## 2024-02-06 MED ORDER — SULINDAC 150 MG PO TABS: 150.0000 mg | ORAL_TABLET | Freq: Two times a day (BID) | ORAL | 1 refills | Status: DC

## 2024-02-07 NOTE — Progress Notes (Unsigned)
   Telephone Visit- Progress Note: Referring Physician:  [name], MD [address] [place],  [place] [address]  Primary Physician:  [name], MD  This visit was performed via telephone.  Patient location: work, she was on a break Provider location: working from home  I spent a total of 20 minutes non-face-to-face activities for this visit on the date of this encounter including review of current clinical condition and response to treatment.    Patient has given verbal consent to this telephone visits and we reviewed the limitations of a telephone visit. Patient wishes to proceed.    Chief Complaint:  follow up  DOS: 06/15/2022 (L L3/4 Microdiscectomy)  DOS: 07/01/2022 (I and D)  DOS: 07/21/2022 (Reexploration and CSF leak repair by Dr. Roy)  History of Present Illness: Marie Mills is a 53 y.o. female has a history of HTN, asthma, anxiety, depression, chronic pain syndrome.   Last seen by Dr. Clois on 09/12/23 for a phone visit. She continued with symptoms of chronic radiculopathy. He recommended she consider SCS evaluation with Dr. Marcelino. She discussed with PCP and was not sure she wanted to consider SCS. She did not show up for appointment with Lateef on 11/02/23.   She has constant LBP and left leg pain (entire leg) to her feet. She has numbness and tingling in her left leg. Not much weakness in her legs. No specific aggravating factors. Has trouble sleeping due to pain. She has some relief with lyrica .   She continues on lyrica  200mg  tid.   Exam: No exam done as this was a telephone encounter.     Imaging: none   Assessment and Plan: Ms. Stretch is s/p above lumbar surgeries. She continues with constant LBP and left leg pain (entire leg) to her feet. She has numbness and tingling in her left leg. Not much weakness in her legs.   Treatment options discussed with patient and following plan made:   - We discussed SCS at length. She would like  to see Dr. Marcelino to learn more about it. Referral placed.  - Continue on lyrica  200mg  tid. She tolerates well and this helps with her pain. We need to see her at least every 5-6 months if we are prescribing. She discussed with PCP and he will not prescribe.  - For now, will keep her f/u open pending evaluation with Dr. Marcelino.   Glade Boys PA-C Neurosurgery

## 2024-02-08 ENCOUNTER — Encounter: Payer: Self-pay | Admitting: Adult Health

## 2024-02-08 ENCOUNTER — Ambulatory Visit: Admitting: Adult Health

## 2024-02-08 VITALS — BP 146/88 | HR 71 | Ht 66.0 in | Wt 248.0 lb

## 2024-02-08 DIAGNOSIS — N926 Irregular menstruation, unspecified: Secondary | ICD-10-CM | POA: Diagnosis not present

## 2024-02-08 DIAGNOSIS — N938 Other specified abnormal uterine and vaginal bleeding: Secondary | ICD-10-CM

## 2024-02-08 DIAGNOSIS — R61 Generalized hyperhidrosis: Secondary | ICD-10-CM | POA: Diagnosis not present

## 2024-02-08 DIAGNOSIS — R232 Flushing: Secondary | ICD-10-CM | POA: Diagnosis not present

## 2024-02-08 DIAGNOSIS — I10 Essential (primary) hypertension: Secondary | ICD-10-CM

## 2024-02-08 LAB — POCT HEMOGLOBIN: Hemoglobin: 12.3 g/dL (ref 11–14.6)

## 2024-02-08 MED ORDER — NORETHINDRONE ACETATE 5 MG PO TABS: ORAL_TABLET | ORAL | 0 refills | Status: DC

## 2024-02-08 NOTE — Progress Notes (Signed)
 Subjective:     Patient ID: Marie Mills, female   DOB: 1971/03/05, 53 y.o.   MRN: [id number]  HPI Marie Mills is a 53 year old white female, divorced, [id number] in complaining of bleeding heavy started Sunday. May change every 15 minutes it seems like, has clots. She had heavy prolonged period in June on BCP, stopped them and took megace  then and bleeding stopped and has not had any bleeding since. Is having hot flashes and night sweats. She missed 2 days of work this week.  She has not felt tired, dizzy or light headed.     Component Value Date/Time   DIAGPAP - Low grade squamous intraepithelial lesion (LSIL) (A) 05/18/2023 0849   DIAGPAP  02/03/2021 1454    - Negative for intraepithelial lesion or malignancy (NILM)   DIAGPAP  05/30/2016 0000    NEGATIVE FOR INTRAEPITHELIAL LESIONS OR MALIGNANCY.   HPVHIGH Negative 05/18/2023 0849   HPVHIGH Negative 02/03/2021 1454   ADEQPAP  05/18/2023 0849    Satisfactory for evaluation; transformation zone component PRESENT.   ADEQPAP  02/03/2021 1454    Satisfactory for evaluation; transformation zone component ABSENT.   ADEQPAP  05/30/2016 0000    Satisfactory for evaluation  endocervical/transformation zone component PRESENT.   PCP is Dr Orpha  Review of Systems +bleeding heavy started Sunday. May change every 15 minutes it seems like, has clots. She had heavy prolonged period in June on BCP, stopped them and took megace  then and bleeding stopped and has not had any bleeding since.  Is having hot flashes and night sweats She has not felt tired, dizzy or light headed she says   Reviewed past medical,surgical, social and family history. Reviewed medications and allergies.  Objective:   Physical Exam BP (!) 146/88 (BP Location: Right Arm, Patient Position: Sitting, Cuff Size: Large)   Pulse 71   Ht 5' 6 (1.676 m)   Wt 248 lb (112.5 kg)   LMP 02/04/2024 (Exact Date)   BMI 40.03 kg/m  Fingerstick HGB was 12.3 Skin warm and dry.Pelvic: external  genitalia is normal in appearance no lesions, vagina: +blood,urethra has no lesions or masses noted, cervix:smooth and bulbous, uterus: normal size, shape and contour, non tender, no masses felt, adnexa: no masses or tenderness noted. Bladder is non tender and no masses felt.     [facility] - 02/08/24 1628       Pregnancy Intention Screening   Does the patient want to become pregnant in the next year? No    Does the patient's partner want to become pregnant in the next year? No    Would the patient like to discuss contraceptive options today? No      Contraception Wrap Up   Current Method Abstinence    End Method Abstinence    Contraception Counseling Provided No         Examination chaperoned by Clarita Salt LPN  Assessment:     1. DUB (dysfunctional uterine bleeding) (Primary) Bleeding heavy since Sunday, had not had period since June when was heavy and prolonged, stopped BCP and used megace  to stop the bleeding then  Will get US  02/12/24 to assess uterus Will rx aygestin  5 mg 1 bid  to stop bleeding Meds ordered this encounter  Medications   norethindrone  (AYGESTIN ) 5 MG tablet    Sig: Take 1 bid x 10 days    Dispense:  20 tablet    Refill:  0    Supervising Provider:   JAYNE MINDER H [[id number]]    -  US  PELVIC COMPLETE WITH TRANSVAGINAL; Future - POCT hemoglobin  2. Irregular periods Has irregular periods Will get US  to assess uterus and ovaries in office 02/12/24  - US  PELVIC COMPLETE WITH TRANSVAGINAL; Future - POCT hemoglobin  3. Hot flashes  4. Night sweats  5. Essential hypertension Continue BP meds and follow up with PCP    Plan:     Return 02/12/24 for pelvic US  in office

## 2024-02-09 ENCOUNTER — Ambulatory Visit (INDEPENDENT_AMBULATORY_CARE_PROVIDER_SITE_OTHER): Admitting: Orthopedic Surgery

## 2024-02-09 ENCOUNTER — Encounter: Payer: Self-pay | Admitting: Orthopedic Surgery

## 2024-02-09 DIAGNOSIS — M961 Postlaminectomy syndrome, not elsewhere classified: Secondary | ICD-10-CM

## 2024-02-09 DIAGNOSIS — Z9889 Other specified postprocedural states: Secondary | ICD-10-CM | POA: Diagnosis not present

## 2024-02-12 ENCOUNTER — Encounter: Payer: Self-pay | Admitting: Orthopedic Surgery

## 2024-02-12 ENCOUNTER — Other Ambulatory Visit

## 2024-02-12 DIAGNOSIS — N289 Disorder of kidney and ureter, unspecified: Secondary | ICD-10-CM

## 2024-02-13 NOTE — Addendum Note (Signed)
 Addended by: HILMA HASTINGS on: 02/13/2024 10:20 AM   Modules accepted: Orders

## 2024-02-19 ENCOUNTER — Ambulatory Visit

## 2024-02-19 ENCOUNTER — Encounter: Payer: Self-pay | Admitting: Radiology

## 2024-02-19 DIAGNOSIS — N926 Irregular menstruation, unspecified: Secondary | ICD-10-CM

## 2024-02-19 DIAGNOSIS — N859 Noninflammatory disorder of uterus, unspecified: Secondary | ICD-10-CM | POA: Diagnosis not present

## 2024-02-19 DIAGNOSIS — N938 Other specified abnormal uterine and vaginal bleeding: Secondary | ICD-10-CM

## 2024-02-19 NOTE — Progress Notes (Signed)
 PELVIC US  TA/TV:homogeneous anteverted uterus,EEC 8.5 mm,avascular echogenic mass within the endometrium (fundal) 9 x 5 x 5 mm,normal ovaries,limited view of right ovary,prominent vessels left adnexal,no free fluid,no pain during ultrasound  Chaperone Peggy

## 2024-02-20 ENCOUNTER — Ambulatory Visit: Payer: Self-pay | Admitting: Adult Health

## 2024-02-21 ENCOUNTER — Telehealth: Payer: Self-pay | Admitting: Orthopedic Surgery

## 2024-02-21 DIAGNOSIS — N2889 Other specified disorders of kidney and ureter: Secondary | ICD-10-CM

## 2024-02-21 NOTE — Telephone Encounter (Signed)
 Insurance denied CT of abdomen and pelvis to evaluate renal lesion. Please cancel this and let her know.   Order put in for CT of abdomen with and without contrast.

## 2024-02-22 ENCOUNTER — Other Ambulatory Visit: Payer: Self-pay | Admitting: Adult Health

## 2024-02-22 MED ORDER — NORETHINDRONE ACETATE 5 MG PO TABS: ORAL_TABLET | ORAL | 0 refills | Status: DC

## 2024-02-22 NOTE — Progress Notes (Signed)
 Refilled aygestin  at [facility]

## 2024-02-22 NOTE — Telephone Encounter (Signed)
 Approved and sent to scheduling.

## 2024-02-28 ENCOUNTER — Ambulatory Visit
Admission: RE | Admit: 2024-02-28 | Discharge: 2024-02-28 | Disposition: A | Discharge status: Home / Self Care | Source: Ambulatory Visit | Attending: Orthopedic Surgery | Admitting: Orthopedic Surgery

## 2024-02-28 ENCOUNTER — Other Ambulatory Visit

## 2024-02-28 DIAGNOSIS — N2889 Other specified disorders of kidney and ureter: Secondary | ICD-10-CM

## 2024-02-28 MED ORDER — IOPAMIDOL (ISOVUE-300) INJECTION 61%
100.0000 mL | Freq: Once | INTRAVENOUS | Status: AC | PRN
  Administered 2024-02-28: 100 mL via INTRAVENOUS

## 2024-02-29 ENCOUNTER — Ambulatory Visit: Admitting: Orthopedic Surgery

## 2024-02-29 ENCOUNTER — Telehealth: Payer: Self-pay

## 2024-02-29 ENCOUNTER — Encounter: Payer: Self-pay | Admitting: Orthopedic Surgery

## 2024-02-29 DIAGNOSIS — N2889 Other specified disorders of kidney and ureter: Secondary | ICD-10-CM | POA: Diagnosis not present

## 2024-02-29 NOTE — Telephone Encounter (Signed)
   Patient called after hours line and requested that we sent her CT and MRI results to her PCP. These results were routed in EPIC to Dr. Yancey Reno as well as faxed.

## 2024-02-29 NOTE — Progress Notes (Signed)
   Telephone Visit- Progress Note: Referring Physician:  [name], MD [address] [place],  [place] [address]  Primary Physician:  [name], MD  This visit was performed via telephone.  Patient location: home Provider location: office  I spent a total of 10 minutes non-face-to-face activities for this visit on the date of this encounter including review of current clinical condition and response to treatment.    Patient has given verbal consent to this telephone visits and we reviewed the limitations of a telephone visit. Patient wishes to proceed.    Chief Complaint:  review imaging results.   History of Present Illness: Marie Mills is a 53 y.o. female has a history of HTN, asthma, DUB, anxiety, depression, chronic pain syndrome.   Phone visit scheduled today to review results of CT abdomen.    Exam: No exam done as this was a telephone encounter.     Imaging: CT abdomen dated 02/28/24:  FINDINGS:   LOWER CHEST: Visualized portion of the lower chest demonstrates no acute abnormality.   HEPATOBILIARY: Status post cholecystectomy. The visualized liver is unremarkable.   SPLEEN: Spleen demonstrates no acute abnormality.   PANCREAS: Pancreas demonstrates no acute abnormality.   ADRENAL GLANDS: Adrenal glands demonstrate no acute abnormality.   KIDNEYS: 2.2 cm enhancing lesion in the posterior interpolar right kidney (image 85), suspicious for solid renal neoplasm such as renal cell carcinoma. Punctate nonobstructing right upper pole renal calculus. Single right renal artery and vein. No renal vein invasion. No hydronephrosis. No perinephric or periureteral stranding.   GI AND BOWEL: Stomach and duodenal sweep demonstrate no acute abnormality. There is no bowel obstruction. No abnormal bowel wall thickening or distension.   PERITONEUM AND RETROPERITONEUM: No ascites or free air. Aorta is normal in caliber.   LYMPH NODES: No  lymphadenopathy.   BONES AND SOFT TISSUES: No acute abnormality of the visualized bones. No focal soft tissue abnormality.   IMPRESSION: 1. Enhancing 2.2 cm posterior interpolar right renal mass, compatible with solid renal neoplasm such as renal cell carcinoma. 2. Single right renal artery and vein. No renal vein invasion.   Electronically signed by: Pinkie Pebbles MD 02/28/2024 07:21 PM EST RP Workstation: [id number]  I have personally reviewed the images and agree with the above interpretation.  Assessment and Plan: Discussed above CT findings with patient. Discussed findings with Dr. Clois prior to her visit.   Following plan made with patient:   - Referral to oncology and urology for renal cell carcinoma. - Results released to patient. She will also discuss with PCP.  - She will follow up with me prn. Call/message with concerns.   Glade Boys PA-C Neurosurgery

## 2024-03-05 ENCOUNTER — Ambulatory Visit: Admitting: Urology

## 2024-03-05 ENCOUNTER — Inpatient Hospital Stay: Admitting: Oncology

## 2024-03-05 NOTE — Progress Notes (Deleted)
 Assessment: 1. Renal mass, right      Plan: I personally reviewed the patient's chart including provider notes, lab and imaging results.    Chief Complaint: No chief complaint on file.   History of Present Illness:  Marie Mills is a 53 y.o. female who is seen in consultation from Glade Boys, PA-C for evaluation of a right renal mass. She underwent evaluation with a MRI of the thoracic spine in June 2025.  This study revealed a right renal lesion. CT abdomen with and without contrast from 02/28/2024 showed a 2.2 cm enhancing lesion in the posterior right kidney suspicious for renal cell carcinoma, punctate nonobstructing right upper pole renal calculus, no evidence of adenopathy or metastatic disease.   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   a.) properative PCR (+) prior to LEFT L3-4 MICRODISCECTOMY   Obesity    Pneumonia    Seizure [facility])    age 39-3; unknown cause nor if on medication    Past Surgical History:  Past Surgical History:  Procedure Laterality Date   CHOLECYSTECTOMY  1987   COLONOSCOPY WITH PROPOFOL  N/A 07/30/2021   Procedure: COLONOSCOPY WITH PROPOFOL ;  Surgeon: Cindie Carlin POUR, DO;  Location: [facility];  Service: Endoscopy;  Laterality: N/A;  2:45pm   I&D lumbar, CSF leak repair, lumbar drain  07/21/2022   Dr Melonie   LUMBAR LAMINECTOMY/DECOMPRESSION MICRODISCECTOMY Left 06/15/2022   Procedure: LEFT L3-4 MICRODISCECTOMY;  Surgeon: Clois Fret, MD;  Location: [facility];  Service: Neurosurgery;  Laterality: Left;   LUMBAR WOUND DEBRIDEMENT N/A 07/01/2022   Procedure: LUMBAR WOUND DEBRIDEMENT;  Surgeon: Clois Fret, MD;  Location: [facility];  Service: Neurosurgery;  Laterality: N/A;   POLYPECTOMY   07/30/2021   Procedure: POLYPECTOMY;  Surgeon: Cindie Carlin POUR, DO;  Location: [facility];  Service: Endoscopy;;    Allergies:  Allergies  Allergen Reactions   Penicillins Anaphylaxis    Has patient had a PCN reaction causing immediate rash, facial/tongue/throat swelling, SOB or lightheadedness with hypotension: Yes Has patient had a PCN reaction causing severe rash involving mucus membranes or skin necrosis: No Has patient had a PCN reaction that required hospitalization Yes Has patient had a PCN reaction occurring within the last 10 years: No If all of the above answers are NO, then may proceed with Cephalosporin use.    Codeine Other (See Comments)    Chest Pain   Sulfa Antibiotics Hives    Family History:  Family History  Problem Relation Age of Onset   Hypertension Mother    Hyperlipidemia Mother    Diabetes Mother    Hyperlipidemia Father    Hypertension Father    Diabetes Father    Seizures Father    Diabetes Sister    Hypertension Sister    Diabetes Sister    Hypertension Sister    Hypertension Sister    Diabetes Brother    Hypertension Brother    Asthma Daughter    Asthma Son    Cancer Maternal Aunt        breast   Colon cancer Neg Hx    Colon polyps Neg Hx     Social History:  Social History   Tobacco Use   Smoking status: Never   Smokeless tobacco: Never  Vaping Use   Vaping status: Never Used  Substance Use Topics   Alcohol use: No   Drug use: No    Review of symptoms:  Constitutional:  Negative for unexplained weight loss, night sweats, fever, chills ENT:  Negative for nose bleeds, sinus pain, painful swallowing CV:  Negative for chest pain, shortness of breath, exercise intolerance, palpitations, loss of consciousness Resp:  Negative for cough, wheezing, shortness of breath GI:  Negative for nausea, vomiting, diarrhea, bloody stools GU:  Positives noted in HPI; otherwise negative for gross hematuria, dysuria, urinary  incontinence Neuro:  Negative for seizures, poor balance, limb weakness, slurred speech Psych:  Negative for lack of energy, depression, anxiety Endocrine:  Negative for polydipsia, polyuria, symptoms of hypoglycemia (dizziness, hunger, sweating) Hematologic:  Negative for anemia, purpura, petechia, prolonged or excessive bleeding, use of anticoagulants  Allergic:  Negative for difficulty breathing or choking as a result of exposure to anything; no shellfish allergy; no allergic response (rash/itch) to materials, foods  Physical exam: LMP 02/04/2024 (Exact Date)  GENERAL APPEARANCE:  Well appearing, well developed, well nourished, NAD HEENT: Atraumatic, Normocephalic, oropharynx clear. NECK: Supple without lymphadenopathy or thyromegaly. LUNGS: Clear to auscultation bilaterally. HEART: Regular Rate and Rhythm without murmurs, gallops, or rubs. ABDOMEN: Soft, non-tender, No Masses. EXTREMITIES: Moves all extremities well.  Without clubbing, cyanosis, or edema. NEUROLOGIC:  Alert and oriented x 3, normal gait, CN II-XII grossly intact.  MENTAL STATUS:  Appropriate. BACK:  Non-tender to palpation.  No CVAT SKIN:  Warm, dry and intact.    Results: U/A:

## 2024-03-06 ENCOUNTER — Inpatient Hospital Stay: Attending: Oncology | Admitting: Oncology

## 2024-03-06 ENCOUNTER — Encounter: Payer: Self-pay | Admitting: Oncology

## 2024-03-06 ENCOUNTER — Inpatient Hospital Stay

## 2024-03-06 VITALS — BP 151/76 | HR 78 | Temp 97.3°F | Resp 18 | Ht 66.0 in | Wt 240.0 lb

## 2024-03-06 DIAGNOSIS — N2889 Other specified disorders of kidney and ureter: Secondary | ICD-10-CM | POA: Diagnosis present

## 2024-03-06 DIAGNOSIS — Z803 Family history of malignant neoplasm of breast: Secondary | ICD-10-CM | POA: Diagnosis not present

## 2024-03-06 LAB — CBC WITH DIFFERENTIAL/PLATELET
Abs Immature Granulocytes: 0.01 K/uL (ref 0.00–0.07)
Basophils Absolute: 0 K/uL (ref 0.0–0.1)
Basophils Relative: 1 %
Eosinophils Absolute: 0.1 K/uL (ref 0.0–0.5)
Eosinophils Relative: 2 %
HCT: 41.6 % (ref 36.0–46.0)
Hemoglobin: 12.8 g/dL (ref 12.0–15.0)
Immature Granulocytes: 0 %
Lymphocytes Relative: 28 %
Lymphs Abs: 1.6 K/uL (ref 0.7–4.0)
MCH: 26.2 pg (ref 26.0–34.0)
MCHC: 30.8 g/dL (ref 30.0–36.0)
MCV: 85.1 fL (ref 80.0–100.0)
Monocytes Absolute: 0.5 K/uL (ref 0.1–1.0)
Monocytes Relative: 8 %
Neutro Abs: 3.6 K/uL (ref 1.7–7.7)
Neutrophils Relative %: 61 %
Platelets: 345 K/uL (ref 150–400)
RBC: 4.89 MIL/uL (ref 3.87–5.11)
RDW: 14.7 % (ref 11.5–15.5)
WBC: 5.9 K/uL (ref 4.0–10.5)
nRBC: 0 % (ref 0.0–0.2)

## 2024-03-06 LAB — COMPREHENSIVE METABOLIC PANEL WITH GFR
ALT: 21 U/L (ref 0–44)
AST: 25 U/L (ref 15–41)
Albumin: 4.3 g/dL (ref 3.5–5.0)
Alkaline Phosphatase: 131 U/L — ABNORMAL HIGH (ref 38–126)
Anion gap: 11 (ref 5–15)
BUN: 19 mg/dL (ref 6–20)
CO2: 22 mmol/L (ref 22–32)
Calcium: 8.9 mg/dL (ref 8.9–10.3)
Chloride: 111 mmol/L (ref 98–111)
Creatinine, Ser: 0.8 mg/dL (ref 0.44–1.00)
GFR, Estimated: >60 mL/min (ref >60)
Glucose, Bld: 107 mg/dL — ABNORMAL HIGH (ref 70–99)
Potassium: 4 mmol/L (ref 3.5–5.1)
Sodium: 144 mmol/L (ref 135–145)
Total Bilirubin: 0.3 mg/dL (ref 0.0–1.2)
Total Protein: 7.2 g/dL (ref 6.5–8.1)

## 2024-03-06 NOTE — Assessment & Plan Note (Addendum)
#  Renal mass: --Differentials include benign lesion, metastatic lesion and primary renal malignancy --Labs today include CBC and CMP.  --Discussed surgical resection for tissue confirmation and treatment for primary renal malignancy.  --Referral to urology to evaluate for surgical resection.  She is scheduled to see Dr. Sherrilee on 04/27/2023.  We will see if we can get this moved up. --PET scan given she only had CT abdomen without pelvis. --RTC once workup is complete

## 2024-03-06 NOTE — Patient Instructions (Addendum)
 [facility]  Discharge Instructions   Rapid Diagnostic Service Visit Discharge Information and Instructions  Thank you for choosing [facility] for your healthcare needs.  Below is a summary of today's discussion, along with our contact information and an outline of what to expect next.  Reason for Visit:  Right renal mass  Proposed Diagnostic Care Plan: Labs today. We will schedule you for a PET scan. You have an appointment with urology.  We may adjust the urology appointment after we get PET scan results.  You will follow up with Dr. Davonna after you see urology.    What to Expect: - Generally, when lab tests are ordered the results can take up to 1 week for results to be available.  At that point, we will contact you to discuss your results with you.  Unless there is a critical result, we will typically wait for all of your lab results to be available before contacting you. - If a biopsy is part of your Care Plan, those results can take on average 7-10 days to result.  Once results are available, we will contact you to discuss your pathology results and any next steps. - If you have additional imaging ordered, such as a CT Scan, MRI, Ultrasound, Bone Scan, or PET scan, your imaging will need to be authorized then scheduled with the earliest available appointment.  You may be asked to travel to another hospital within [facility] who has a sooner availability, please consider doing so if asked. - If you use MyChart, your results will be available to you in the MyChart portal.  Your provider will be in touch with you as soon as all of your results are available to be discussed.  Your Diagnostic Clinic Provider:  Delon Hope, NP. Your Diagnostic Navigator:  Dena Daring, RN.  Contact number [phone].  If you or your caregiver have number blocking on your cell phones, please ensure the cancer center's numbers are not blocked.  If you are not a  registered MyChart user, please consider enrolling in MyChart to receive your test results and visit notes.  You can also access your discharge instructions electronically.  MyChart also gives you an electronic means to communicate with your Care Team instead of needing to call in to the cancer center.  We appreciate you trusting us  with your healthcare and look forward to partnering with you as we work to uncover what your potential diagnosis may be.  Please do not hesitate to reach out at any point with questions or concerns.     Thank you for choosing [facility] to provide your oncology and hematology care.   To afford each patient quality time with our provider, please arrive at least 15 minutes before your scheduled appointment time. You may need to reschedule your appointment if you arrive late (10 or more minutes). Arriving late affects you and other patients whose appointments are after yours.  Also, if you miss three or more appointments without notifying the office, you may be dismissed from the clinic at the provider's discretion.    Again, thank you for choosing [facility].  Our hope is that these requests will decrease the amount of time that you wait before being seen by our physicians.   If you have a lab appointment with the Cancer Center - please note that after April 8th, all labs will be drawn in the cancer center.  You do not have to check in or register with the main entrance as you have in the past but will complete your check-in at the cancer center.            _____________________________________________________________  Should you have questions after your visit to [facility], please contact our office at [phone] and follow the prompts.  Our office hours are 8:00 a.m. to 4:30 p.m. Monday - Thursday and 8:00 a.m. to 2:30 p.m. Friday.  Please note that voicemails left after 4:00 p.m. may not be returned until the  following business day.  We are closed weekends and all major holidays.  You do have access to a nurse 24-7, just call the main number to the clinic [phone] and do not press any options, hold on the line and a nurse will answer the phone.    For prescription refill requests, have your pharmacy contact our office and allow 72 hours.    Masks are no longer required in the cancer centers. If you would like for your care team to wear a mask while they are taking care of you, please let them know. You may have one support person who is at least 51 years old accompany you for your appointments.

## 2024-03-06 NOTE — Progress Notes (Unsigned)
 [facility] [phone]   [phone]  INITIAL CONSULTATION:  Patient Care Team: [name], MD as PCP - General (Internal Medicine) Okey Vina GAILS, MD as PCP - Cardiology (Cardiology) Delores Dena RAMAN, RN as Oncology Nurse Navigator  CHIEF COMPLAINTS/PURPOSE OF CONSULTATION:  Renal mass  HISTORY OF PRESENTING ILLNESS:  Marie Mills 53 y.o. female with medical history significant for hypertension, asthma, DU B, anxiety, depression and chronic pain syndrome who was recently evaluated by  neurosurgery for follow-up for chronic radiculopathy and recently had CT scan of her abdomen due to abnormal MRI from 09/30/2023.  MRI showed atypical appearing lesion in the posterior right kidney and it recommended CT scan with contrast.  CT scan showed enhancing 2.2 cm posterior interpolar right renal mass compatible with solid renal neoplasm such as renal cell carcinoma.  She was referred to oncology and nephrology.  She had transvaginal ultrasound earlier in the month for dysfunctional uterine bleeding with irregular periods which was unremarkable.  On review of the previous records it appears she has chronic lumbar and thoracic pain with several MRIs and CT scans.  On exam today patient is doing well.  She is here with her daughter.  Reports a family history of kidney cancer (sister) but feels like it was on the outside of her kidney and it was frozen off, breast cancer and colon cancer.  Reports intentional weight loss.  She has stable chronic low back pain.  MEDICAL HISTORY:  Past Medical History:  Diagnosis Date  . Asthma   . BV (bacterial vaginosis) 08/29/2013  . Contraceptive management 03/21/2013  . GERD (gastroesophageal reflux disease)   . Hypertension   . Lumbar radiculopathy   . 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 Obesity   . Pneumonia   . Seizure [facility])     age 74-3; unknown cause nor if on medication    SURGICAL HISTORY: Past Surgical History:  Procedure Laterality Date  . CHOLECYSTECTOMY  1987  . COLONOSCOPY WITH PROPOFOL  N/A 07/30/2021   Procedure: COLONOSCOPY WITH PROPOFOL ;  Surgeon: Cindie Carlin POUR, DO;  [facility];  Service: Endoscopy;  Laterality: N/A;  2:45pm  . I&D lumbar, CSF leak repair, lumbar drain  07/21/2022   Dr Melonie  . LUMBAR LAMINECTOMY/DECOMPRESSION MICRODISCECTOMY Left 06/15/2022   Procedure: LEFT L3-4 MICRODISCECTOMY;  Surgeon: Clois Fret, MD;  Location: [facility] ORS;  Service: Neurosurgery;  Laterality: Left;  . LUMBAR WOUND DEBRIDEMENT N/A 07/01/2022   Procedure: LUMBAR WOUND DEBRIDEMENT;  Surgeon: Clois Fret, MD;  Location: [facility] ORS;  Service: Neurosurgery;  Laterality: N/A;  . POLYPECTOMY  07/30/2021   Procedure: POLYPECTOMY;  Surgeon: Cindie Carlin POUR, DO;  [facility];  Service: Endoscopy;;    SOCIAL HISTORY: Social History   Socioeconomic History  . Marital status: Divorced    Spouse name: Not on file  . Number of children: 3  . Years of education: Not on file  . Highest education level: Not on file  Occupational History  . Not on file  Tobacco Use  . Smoking status: Never  . Smokeless tobacco: Never  Vaping Use  . Vaping status: Never Used  Substance and Sexual Activity  . Alcohol use: No  . Drug use: No  . Sexual activity: Not Currently    Birth control/protection: None, Abstinence  Other Topics Concern  . Not on file  Social History Narrative   Lives  with daughter   Social Drivers of Health   Financial Resource Strain: Low Risk  (05/18/2023)   Overall Financial Resource Strain (CARDIA)   . Difficulty of Paying Living Expenses: Not very hard  Food Insecurity: Food Insecurity Present (05/18/2023)   Hunger Vital Sign   . Worried About Programme Researcher, Broadcasting/film/video in the Last Year: Sometimes true   . Ran Out of Food in the Last Year: Sometimes true   Transportation Needs: No Transportation Needs (05/18/2023)   PRAPARE - Transportation   . Lack of Transportation (Medical): No   . Lack of Transportation (Non-Medical): No  Physical Activity: Sufficiently Active (05/18/2023)   Exercise Vital Sign   . Days of Exercise per Week: 7 days   . Minutes of Exercise per Session: 100 min  Stress: No Stress Concern Present (05/18/2023)   [employer] of Occupational Health - Occupational Stress Questionnaire   . Feeling of Stress : Only a little  Social Connections: Socially Isolated (05/18/2023)   Social Connection and Isolation Panel   . Frequency of Communication with Friends and Family: More than three times a week   . Frequency of Social Gatherings with Friends and Family: More than three times a week   . Attends Religious Services: Never   . Active Member of Clubs or Organizations: No   . Attends Banker Meetings: Never   . Marital Status: Divorced  Catering Manager Violence: Not At Risk (05/18/2023)   Humiliation, Afraid, Rape, and Kick questionnaire   . Fear of Current or Ex-Partner: No   . Emotionally Abused: No   . Physically Abused: No   . Sexually Abused: No    FAMILY HISTORY: Family History  Problem Relation Age of Onset  . Hypertension Mother   . Hyperlipidemia Mother   . Diabetes Mother   . Hyperlipidemia Father   . Hypertension Father   . Diabetes Father   . Seizures Father   . Diabetes Sister   . Hypertension Sister   . Diabetes Sister   . Hypertension Sister   . Hypertension Sister   . Diabetes Brother   . Hypertension Brother   . Asthma Daughter   . Asthma Son   . Cancer Maternal Aunt        breast  . Colon cancer Neg Hx   . Colon polyps Neg Hx     ALLERGIES:  is allergic to penicillins, codeine, and sulfa antibiotics.  MEDICATIONS:  Current Outpatient Medications  Medication Sig Dispense Refill  . albuterol  (VENTOLIN  HFA) 108 (90 Base) MCG/ACT inhaler Inhale 2 puffs into the lungs every  4 (four) hours as needed for wheezing or shortness of breath. 18 g 0  . amLODipine  (NORVASC ) 2.5 MG tablet Take 2.5 mg by mouth daily.    . cetirizine  (ZYRTEC ) 10 MG tablet Take 1 tablet (10 mg total) by mouth daily. 30 tablet 0  . Cyanocobalamin (B-12) 100 MCG TABS Take 1 tablet by mouth daily.    . famotidine (PEPCID) 40 MG tablet Take 40 mg by mouth daily.    . fluticasone (FLONASE) 50 MCG/ACT nasal spray Place 1 spray into both nostrils daily as needed.    . hydrOXYzine  (VISTARIL ) 25 MG capsule Take 25 mg by mouth 3 (three) times daily as needed.    SABRA ipratropium (ATROVENT) 0.06 % nasal spray Place 2 sprays into both nostrils 4 (four) times daily as needed.    . metoprolol  succinate (TOPROL -XL) 25 MG 24 hr tablet Take 25 mg by mouth  daily.    . Misc Natural Products (ELDERBERRY IMMUNE COMPLEX PO) Take 2 tablets by mouth daily.    . montelukast (SINGULAIR) 10 MG tablet Take 10 mg by mouth daily.    . norethindrone  (AYGESTIN ) 5 MG tablet Take 1 bid x 10 days 20 tablet 0  . olmesartan (BENICAR) 40 MG tablet Take 40 mg by mouth daily.    . omeprazole  (PRILOSEC) 40 MG capsule TK 1 C PO QD 30 capsule 6  . Pediatric Multiple Vitamins (FLINSTONES GUMMIES OMEGA-3 DHA PO) Take by mouth.    . phentermine  (ADIPEX-P ) 37.5 MG tablet Take 37.5 mg by mouth every morning.    . Potassium 99 MG TABS Take 1 tablet by mouth daily.    . pregabalin  (LYRICA ) 200 MG capsule Take 1 capsule (200 mg total) by mouth 3 (three) times daily. 90 capsule 0  . Pyridoxine HCl (B-6 PO) Take 1 tablet by mouth daily.    . sulindac  (CLINORIL ) 150 MG tablet Take 1 tablet (150 mg total) by mouth 2 (two) times daily. 60 tablet 1  . tiZANidine  (ZANAFLEX ) 4 MG tablet Take 4 mg by mouth at bedtime.     No current facility-administered medications for this visit.    REVIEW OF SYSTEMS:   Review of Systems  Musculoskeletal:  Positive for back pain.     PHYSICAL EXAMINATION: ECOG PERFORMANCE STATUS: {CHL ONC ECOG  PS:[phone]}  There were no vitals filed for this visit. There were no vitals filed for this visit.  Physical Exam Constitutional:      Appearance: Normal appearance.  HENT:     Head: Normocephalic and atraumatic.  Eyes:     Pupils: Pupils are equal, round, and reactive to light.  Cardiovascular:     Rate and Rhythm: Normal rate and regular rhythm.     Heart sounds: Normal heart sounds. No murmur heard. Pulmonary:     Effort: Pulmonary effort is normal.     Breath sounds: Normal breath sounds. No wheezing.  Abdominal:     General: Bowel sounds are normal. There is no distension.     Palpations: Abdomen is soft.     Tenderness: There is no abdominal tenderness.  Musculoskeletal:        General: Normal range of motion.     Cervical back: Normal range of motion.  Skin:    General: Skin is warm and dry.     Findings: No rash.  Neurological:     Mental Status: She is alert and oriented to person, place, and time.  Psychiatric:        Judgment: Judgment normal.      LABORATORY DATA:  I have reviewed the data as listed    Latest Ref Rng & Units 02/08/2024    4:57 PM 09/08/2022    7:12 PM 07/07/2022    7:00 AM  CBC  WBC 4.0 - 10.5 K/uL  7.4  8.4   Hemoglobin 11 - 14.6 g/dL 87.6  89.6  88.9   Hematocrit 36.0 - 46.0 %  32.7  34.5   Platelets 150 - 400 K/uL  336  299        Latest Ref Rng & Units 09/08/2022    7:12 PM 07/07/2022    7:00 AM 07/06/2022    5:33 AM  CMP  Glucose 70 - 99 mg/dL 87  89  83   BUN 6 - 20 mg/dL 18  13  14    Creatinine 0.44 - 1.00 mg/dL 9.35  9.39  0.58   Sodium 135 - 145 mmol/L 137  137  138   Potassium 3.5 - 5.1 mmol/L 3.7  4.0  3.7   Chloride 98 - 111 mmol/L 108  103  108   CO2 22 - 32 mmol/L 20  26  25    Calcium 8.9 - 10.3 mg/dL 8.7  8.6  8.2   Total Protein 6.5 - 8.1 g/dL 6.9   6.3   Total Bilirubin 0.3 - 1.2 mg/dL 0.7   0.6   Alkaline Phos 38 - 126 U/L 83   111   AST 15 - 41 U/L 36   36   ALT 0 - 44 U/L 22   51      RADIOGRAPHIC  STUDIES: I have personally reviewed the radiological images as listed and agreed with the findings in the report. CT ABDOMEN W WO CONTRAST Result Date: 02/28/2024 EXAM: CT ABDOMEN WITHOUT AND WITH CONTRAST 02/28/2024 03:55:01 PM TECHNIQUE: CT of the abdomen was performed without and with the administration of 100 mL of iopamidol (ISOVUE-300) 61% injection. Multiplanar reformatted images are provided for review. Automated exposure control, iterative reconstruction, and/or weight based adjustment of the mA/kV was utilized to reduce the radiation dose to as low as reasonably achievable. COMPARISON: 06/09/2015. CLINICAL HISTORY: Renal mass/cyst, indeterminate. FINDINGS: LOWER CHEST: Visualized portion of the lower chest demonstrates no acute abnormality. HEPATOBILIARY: Status post cholecystectomy. The visualized liver is unremarkable. SPLEEN: Spleen demonstrates no acute abnormality. PANCREAS: Pancreas demonstrates no acute abnormality. ADRENAL GLANDS: Adrenal glands demonstrate no acute abnormality. KIDNEYS: 2.2 cm enhancing lesion in the posterior interpolar right kidney (image 85), suspicious for solid renal neoplasm such as renal cell carcinoma. Punctate nonobstructing right upper pole renal calculus. Single right renal artery and vein. No renal vein invasion. No hydronephrosis. No perinephric or periureteral stranding. GI AND BOWEL: Stomach and duodenal sweep demonstrate no acute abnormality. There is no bowel obstruction. No abnormal bowel wall thickening or distension. PERITONEUM AND RETROPERITONEUM: No ascites or free air. Aorta is normal in caliber. LYMPH NODES: No lymphadenopathy. BONES AND SOFT TISSUES: No acute abnormality of the visualized bones. No focal soft tissue abnormality. IMPRESSION: 1. Enhancing 2.2 cm posterior interpolar right renal mass, compatible with solid renal neoplasm such as renal cell carcinoma. 2. Single right renal artery and vein. No renal vein invasion. Electronically signed by:  Pinkie Pebbles MD 02/28/2024 07:21 PM EST RP Workstation: [id number]   US  PELVIC COMPLETE WITH TRANSVAGINAL Result Date: 02/20/2024 Images from the original result were not included.  ..an Financial Trader of Ultrasound Medicine TECHNICAL SALES ENGINEER) accredited practice Center for [facility] @ [facility] [address] [place] [address] Ordering Provider: Signa Delon LABOR, NP                                                                                                                                   GYNECOLOGIC SONOGRAM EXAM: TRANSABDOMINAL AND TRANSVAGINAL ULTRASOUND OF PELVIS  TECHNIQUE:  Transabdominal and transvaginal ultrasound examinations of the pelvis were performed. Transabdominal technique was performed for global imaging of the pelvis including uterus, ovaries, adnexal regions, and pelvic cul-de-sac. It was necessary to proceed with endovaginal exam following the transabdominal exam to better visualize and improve detailed examination of  the uterus, endometrium and bilateral ovaries, as well as, evaluation of ovarian blood flow and sliding characteristics. Marie Mills is a 53 y.o. [phone] Patient's last menstrual period was 02/04/2024 (exact date). She is here for a pelvic sonogram for dysfunctional uterine bleeding with irregular periods. Uterus                      6.5 x 4.4 x 4.9 cm, Total uterine volume 73 cc,homogeneous anteverted uterus,WNL Endometrium          8.5 mm, symmetrical, avascular echogenic mass within the endometrium (fundal) 9 x 5 x 5 mm Right ovary             1.9 x 1.2 x 1.8 cm, normal,limited view Left ovary                2.7 x 1.4 x 2.3 cm, normal,appears mobile No free fluid Technician Comments: PELVIC US  TA/TV:homogeneous anteverted uterus WNL,EEC 8.5 mm,avascular echogenic mass within the endometrium (fundal) 9 x 5 x 5 mm,normal ovaries,limited view of right ovary,prominent vessels left adnexal,no free fluid,no pain during ultrasound Chaperone [address] JINNY Pitts 02/19/2024 4:05 PM Clinical Impression and recommendations: I have reviewed the sonogram results above, combined with the patient's current clinical course, below are my impressions and any appropriate recommendations for management based on the sonographic findings. Normal uterine size and shape. Normal endometrium, echogenic mass noted measuring 9 x 5 x 5 mm- possible endometrial polyp Normal ovaries bilaterally Advise clinical correlation and consider SHG for further evaluation of endometrial polyp. Marie Mills M Ozan 02/20/2024 9:02 AM     ASSESSMENT & PLAN Assessment & Plan Renal mass, right #Renal mass: --Differentials include benign lesion, metastatic lesion and primary renal malignancy --Labs today include CBC and CMP --Discussed surgical resection for tissue confirmation and treatment for primary renal malignancy.  --Referral to urology to evaluate for surgical resection --RTC once workup is complete     Orders Placed This Encounter  Procedures  . CBC with Differential    Standing Status:   Future    Expected Date:   03/06/2024    Expiration Date:   06/04/2024  . Comprehensive metabolic panel    Standing Status:   Future    Expected Date:   03/06/2024    Expiration Date:   06/04/2024    All questions were answered. The patient knows to call the clinic with any problems, questions or concerns.  I have spent a total of 40 minutes minutes of face-to-face and non-face-to-face time, preparing to see the patient, obtaining and/or reviewing separately obtained history, performing a medically appropriate examination, counseling and educating the patient, ordering medications/tests/procedures, referring and communicating with other health care professionals, documenting clinical information in the electronic health record, independently interpreting results and communicating results to the patient, and care coordination.   Delon Hope, AGNP-C Department of Hematology/Oncology [facility] at [facility]  Phone: [phone]

## 2024-03-07 ENCOUNTER — Telehealth: Payer: Self-pay | Admitting: Orthopedic Surgery

## 2024-03-07 ENCOUNTER — Other Ambulatory Visit: Payer: Self-pay | Admitting: Adult Health

## 2024-03-07 DIAGNOSIS — M961 Postlaminectomy syndrome, not elsewhere classified: Secondary | ICD-10-CM

## 2024-03-07 DIAGNOSIS — Z9889 Other specified postprocedural states: Secondary | ICD-10-CM

## 2024-03-07 DIAGNOSIS — M5416 Radiculopathy, lumbar region: Secondary | ICD-10-CM

## 2024-03-07 MED ORDER — MEGESTROL ACETATE 40 MG PO TABS: ORAL_TABLET | ORAL | 1 refills | Status: DC

## 2024-03-07 MED ORDER — PREGABALIN 200 MG PO CAPS: 200.0000 mg | ORAL_CAPSULE | Freq: Three times a day (TID) | ORAL | 0 refills | Status: DC

## 2024-03-07 NOTE — Telephone Encounter (Signed)
 Date11/20/25 Provider LUNA Med Requesting Pregabalin  Pharmacy [facility] [address] in [place] Patient contact [phone]

## 2024-03-07 NOTE — Telephone Encounter (Signed)
 Left detailed voicemail for the patient, ok per DPR on file

## 2024-03-07 NOTE — Addendum Note (Signed)
 Addended by: Naava Janeway on: 03/07/2024 01:46 PM   Modules accepted: Level of Service

## 2024-03-07 NOTE — Telephone Encounter (Signed)
 Lyrica  refill sent to pharmacy. PMP reviewed and is appropriate.   Please let her know.

## 2024-03-07 NOTE — Progress Notes (Signed)
 Stop aygestin  and rx megace 

## 2024-03-08 NOTE — Telephone Encounter (Signed)
 Patient states that [facility] pharmacy is not able to fill the medicine. They are having issues with their facility.  Please send to [id number] [address] [place],  [phone]

## 2024-03-11 NOTE — Telephone Encounter (Signed)
 I reviewed her PMP and lyrica  was filled the [facility] on [address] on 03/09/24.   Please call them- if it was filled she can get there. It should not be on PMP unless they filled it.   Let me know what [facility] says.

## 2024-03-11 NOTE — Telephone Encounter (Signed)
 Patient stated she went to [facility] on 11/22 and was able to get her Lyrica  refilled, she stated she has been having trouble with that pharmacy and will probably use [facility] for her next refill

## 2024-03-18 ENCOUNTER — Other Ambulatory Visit: Payer: Self-pay | Admitting: Obstetrics & Gynecology

## 2024-03-18 DIAGNOSIS — N840 Polyp of corpus uteri: Secondary | ICD-10-CM

## 2024-03-19 ENCOUNTER — Ambulatory Visit: Admitting: Obstetrics & Gynecology

## 2024-03-19 ENCOUNTER — Other Ambulatory Visit

## 2024-03-19 ENCOUNTER — Other Ambulatory Visit: Payer: Self-pay | Admitting: Oncology

## 2024-03-19 ENCOUNTER — Encounter: Payer: Self-pay | Admitting: Obstetrics & Gynecology

## 2024-03-19 VITALS — BP 129/84 | HR 99 | Ht 66.0 in | Wt 241.0 lb

## 2024-03-19 DIAGNOSIS — N939 Abnormal uterine and vaginal bleeding, unspecified: Secondary | ICD-10-CM

## 2024-03-19 DIAGNOSIS — N840 Polyp of corpus uteri: Secondary | ICD-10-CM

## 2024-03-19 DIAGNOSIS — Z30430 Encounter for insertion of intrauterine contraceptive device: Secondary | ICD-10-CM

## 2024-03-19 DIAGNOSIS — N2889 Other specified disorders of kidney and ureter: Secondary | ICD-10-CM

## 2024-03-19 MED ORDER — LEVONORGESTREL 20 MCG/DAY IU IUD
1.0000 | INTRAUTERINE_SYSTEM | Freq: Once | INTRAUTERINE | Status: AC
  Administered 2024-03-19: 1 via INTRAUTERINE

## 2024-03-19 NOTE — Progress Notes (Signed)
 GYN VISIT Patient name: Marie Mills MRN [id number]  Date of birth: [birth year] Chief Complaint:   Follow-up  History of Present Illness:   Marie Mills is a 53 y.o. [phone]  female being seen today for AUB follow up  AUB: This past year noted a change in menses.  Skipped 4 mos then had bleeding that didn't stop for several week- took Megace  for about a week- bleeding improved and again was doing ok.  Then the bleeding started back again and then restarted the Megace - currently twice daily.  With this current medication, no longer having any bleeding.     SHG completed today- lining overall appears normal to thin.  Small 3.8 x 3.5 x 2.1 mm echogenic polyp within the endometrial cavity.  Of note, pt recently diagnosed with kidney mass that may likely be a malingnancy and will be needing to undergo surgery.  Patient's last menstrual period was 03/12/2024.    Review of Systems:   Pertinent items are noted in HPI Denies fever/chills, dizziness, headaches, visual disturbances, fatigue, shortness of breath, chest pain, abdominal pain, vomiting Pertinent History Reviewed:   Past Surgical History:  Procedure Laterality Date   CHOLECYSTECTOMY  1987   COLONOSCOPY WITH PROPOFOL  N/A 07/30/2021   Procedure: COLONOSCOPY WITH PROPOFOL ;  Surgeon: Cindie Carlin POUR, DO;  [facility];  Service: Endoscopy;  Laterality: N/A;  2:45pm   I&D lumbar, CSF leak repair, lumbar drain  07/21/2022   Dr Melonie   LUMBAR LAMINECTOMY/DECOMPRESSION MICRODISCECTOMY Left 06/15/2022   Procedure: LEFT L3-4 MICRODISCECTOMY;  Surgeon: Clois Fret, MD;  Location: [facility] ORS;  Service: Neurosurgery;  Laterality: Left;   LUMBAR WOUND DEBRIDEMENT N/A 07/01/2022   Procedure: LUMBAR WOUND DEBRIDEMENT;  Surgeon: Clois Fret, MD;  Location: [facility] ORS;  Service: Neurosurgery;  Laterality: N/A;   POLYPECTOMY  07/30/2021   Procedure: POLYPECTOMY;  Surgeon: Cindie Carlin POUR, DO;  [facility];  Service: Endoscopy;;    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 Physical Assessment:   Vitals:   03/19/24 1119  BP: 129/84  Pulse: 99  Weight: 241 lb (109.3 kg)  Height: 5' 6 (1.676 m)  Body mass index is 38.9 kg/m.       Physical Examination:   General appearance: alert, well appearing, and in no distress  Psych: mood appropriate, normal affect  Skin: warm & dry   Cardiovascular: normal heart rate noted  Respiratory: normal respiratory effort, no distress  Abdomen: soft, non-tender   Pelvic: VULVA: normal appearing vulva with no masses, tenderness or lesions, VAGINA: normal appearing vagina with normal color and discharge, no lesions, CERVIX: normal appearing cervix without discharge or lesions, UTERUS: uterus is normal size, shape, consistency and nontender  Extremities: no edema   Chaperone: Alan Fischer    IUD INSERTION   The risks and benefits of the method and placement have been thouroughly reviewed with the patient and all questions were answered.  Specifically the patient is aware of failure rate of 04/998, expulsion of the IUD and of possible perforation.  The patient is aware of irregular bleeding due to the method and understands the incidence of irregular bleeding diminishes with time.  Signed copy of informed consent in chart.    A sterile speculum was placed in the vagina.  The cervix was visualized, prepped using Betadine, and grasped with a single tooth tenaculum. The uterus was sounded to 6 cm.  Mirena  IUD placed per manufacturer's  recommendations. The strings were trimmed to approximately 3 cm. The patient tolerated the procedure well.    Chaperone: Alan Fischer    Assessment & Plan:   1)AUB -Reviewed US  findings, overall benign appearing endometrium -discussed management options including continued Megace , Depot or LARC -discussed surgical intervention including endometrial ablation or hysterectomy.  Reviewed risk/benefit including but not limited to risk of bleeding, infection, injury to surrounding organs requiring further surgical intervention - Discussed potential for long-term sequelae such as fistula or vaginal cuff dehiscence - Discussed pelvic rest and recovery for 8 to 12 weeks  -At this time patient mentioned the renal mass and that that surgical intervention would take precedence After discussion regarding her options patient desires to proceed with Mirena IUD today- inform consent obtained  -Mirena IUD insertion The patient was given post procedure instructions, including signs and symptoms of infection and to check for the strings after each menses or each month, and refraining from intercourse or anything in the vagina for 3 days. She was given a care card with date IUD placed, and date IUD to be removed.   Plan to f/u in ~2 mos    Marie Farabaugh, DO Attending Obstetrician & Gynecologist, [facility] for [facility], [facility]

## 2024-03-19 NOTE — Progress Notes (Signed)
 Pet Denied.   Ordered CT chest to r/out disease in chest.   Delon Hope, AGNP-C Department of Hematology/Oncology [facility] at [facility]  Phone: [phone]  03/19/2024 10:18 AM

## 2024-03-19 NOTE — Progress Notes (Signed)
 SONOHYSTEROGRAM/PELVIC US  TV: homogeneous anteverted uterus,normal ovaries,EEC 1.2 mm  Sonohysterogram was preformed by Dr. Ozan, with ultrasound guidance, and surveillance throughout the procedure.  Saline outlined a 3.8 x 3.5 x 2.1 mm echogenic polyp within the endometrial cavity (fundal)

## 2024-03-21 ENCOUNTER — Ambulatory Visit (HOSPITAL_COMMUNITY)

## 2024-03-25 ENCOUNTER — Other Ambulatory Visit: Payer: Self-pay | Admitting: Adult Health

## 2024-03-25 MED ORDER — FLUCONAZOLE 150 MG PO TABS: ORAL_TABLET | ORAL | 1 refills | Status: AC

## 2024-03-25 NOTE — Progress Notes (Signed)
 Rx diflucan.

## 2024-04-08 ENCOUNTER — Ambulatory Visit (HOSPITAL_COMMUNITY)
Admission: RE | Admit: 2024-04-08 | Discharge: 2024-04-08 | Disposition: A | Discharge status: Home / Self Care | Source: Ambulatory Visit | Attending: Oncology | Admitting: Oncology

## 2024-04-08 DIAGNOSIS — N2889 Other specified disorders of kidney and ureter: Secondary | ICD-10-CM | POA: Diagnosis present

## 2024-04-08 LAB — POCT I-STAT CREATININE: Creatinine, Ser: 0.8 mg/dL (ref 0.44–1.00)

## 2024-04-08 MED ORDER — IOHEXOL 300 MG/ML  SOLN
80.0000 mL | Freq: Once | INTRAMUSCULAR | Status: AC | PRN
  Administered 2024-04-08: 80 mL via INTRAVENOUS

## 2024-04-13 ENCOUNTER — Other Ambulatory Visit: Payer: Self-pay

## 2024-04-13 ENCOUNTER — Emergency Department (HOSPITAL_COMMUNITY)
Admission: EM | Admit: 2024-04-13 | Discharge: 2024-04-13 | Disposition: A | Discharge status: Home / Self Care | Attending: Emergency Medicine | Admitting: Emergency Medicine

## 2024-04-13 ENCOUNTER — Encounter (HOSPITAL_COMMUNITY): Payer: Self-pay | Admitting: Emergency Medicine

## 2024-04-13 DIAGNOSIS — G5603 Carpal tunnel syndrome, bilateral upper limbs: Secondary | ICD-10-CM | POA: Diagnosis not present

## 2024-04-13 DIAGNOSIS — J45909 Unspecified asthma, uncomplicated: Secondary | ICD-10-CM | POA: Insufficient documentation

## 2024-04-13 DIAGNOSIS — M65331 Trigger finger, right middle finger: Secondary | ICD-10-CM | POA: Diagnosis not present

## 2024-04-13 DIAGNOSIS — Z7951 Long term (current) use of inhaled steroids: Secondary | ICD-10-CM | POA: Insufficient documentation

## 2024-04-13 DIAGNOSIS — M79642 Pain in left hand: Secondary | ICD-10-CM | POA: Diagnosis present

## 2024-04-13 DIAGNOSIS — I10 Essential (primary) hypertension: Secondary | ICD-10-CM | POA: Diagnosis not present

## 2024-04-13 DIAGNOSIS — Z79899 Other long term (current) drug therapy: Secondary | ICD-10-CM | POA: Diagnosis not present

## 2024-04-13 MED ORDER — KETOROLAC TROMETHAMINE 15 MG/ML IJ SOLN
15.0000 mg | Freq: Once | INTRAMUSCULAR | Status: AC
  Administered 2024-04-13: 15 mg via INTRAMUSCULAR
  Filled 2024-04-13: qty 1

## 2024-04-13 NOTE — ED Triage Notes (Signed)
 Pov c/o of bilateral hand pain/weakness for 2 weeks. Endorses burning, numbness and tingling. States bilateral middle fingers keep locking up. Pt able to use cellular device during triage.

## 2024-04-13 NOTE — Discharge Instructions (Addendum)
 We evaluated you for your tingling and pain in both hands.  We suspect that you have probably have carpal tunnel syndrome.  We have given you wrist braces.  Please wear these at nighttime especially but also during the daytime is much as you can.  Please continue to take your Aleve .  Also take 1000 mg of Tylenol  every 6 hours as needed for pain.  You can also apply ice to your hands to help with pain and swelling.  You may also have trigger finger in your right hand.  The treatment for this is also usually pain control.  Please follow-up with Dr. Margrette for your symptoms.  He will be able to help manage your symptoms and can refer you to a hand specialist if necessary.

## 2024-04-13 NOTE — ED Provider Notes (Signed)
 " [place] EMERGENCY DEPARTMENT AT [facility] Provider Note  CSN: [id number] Arrival date & time: 04/13/24 1428  Chief Complaint(s) Hand Problem  HPI Marie Mills is a 53 y.o. female history of hypertension presenting to the emergency department with bilateral hand pain.  She reports pain and sensation of swelling in both hands, tingling in the fingers.  Worse at nighttime.  Also feels like her right middle finger gets stuck in a flexed position and she has to force it open.  No fevers or chills.  No injury.  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  No other new symptoms.  Been taking Aleve  without much help.  No neck pain.   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   a.) properative PCR (+) prior to LEFT L3-4 MICRODISCECTOMY   Obesity    Pneumonia    Seizure [facility])    age 43-3; unknown cause nor if on medication   Patient Active Problem List   Diagnosis Date Noted   Renal mass, right 03/06/2024   Night sweats 02/08/2024   Irregular periods 02/08/2024   DUB (dysfunctional uterine bleeding) 02/08/2024   Hot flashes 11/02/2023   Moody 11/02/2023   Irregular bleeding 11/02/2023   Epidural abscess 07/04/2022   Postoperative wound infection 07/01/2022   Infection of lumbar spine (HCC) 07/01/2022   Lumbar discitis 06/28/2022   Asthma, chronic 06/28/2022   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   H/O microdiscectomy 06/28/2022   Lumbar disc herniation 06/15/2022   Encounter for screening fecal occult blood testing 03/23/2022   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) 02/15/2022   Lumbar radiculopathy 02/15/2022   Spinal stenosis of lumbar region with neurogenic claudication 02/15/2022   Chronic pain syndrome  02/15/2022   Acute bilateral low back pain with bilateral sciatica 12/30/2021   Headache above the eye region 12/24/2021   Recurrent sinusitis 12/24/2021   Skin excoriation 12/14/2021   Superficial fungus infection of skin 12/06/2021   Vaginal yeast infection 12/06/2021   Vaginal itching 12/06/2021   Vaginal burning 12/06/2021   Visit for suture removal 12/02/2021   Burning with urination 11/16/2021   History of sinusitis 11/12/2021   Nasal septal deviation 11/12/2021   Pain and swelling of right knee 10/29/2021   Encounter for screening colonoscopy 06/29/2021   Screening examination for STD (sexually transmitted disease) 05/06/2021   Epidermal cyst 05/06/2021   Screening mammogram for breast cancer 02/03/2021   Routine general medical examination at a health care facility 02/03/2021   Encounter for gynecological examination with Papanicolaou smear of cervix 02/03/2021   Encounter for surveillance of contraceptive pills 02/03/2021   Anxiety and depression 02/03/2021   Weight gain 07/04/2019   Body mass index 39.0-39.9, adult 05/28/2019   Body mass index 37.0-37.9, adult 04/08/2019   Body mass index 38.0-38.9, adult 02/04/2019   Body mass index 40.0-44.9, adult (HCC) 11/01/2018   Encounter for well woman exam with routine gynecological exam 10/08/2018   Weight loss counseling, encounter for 10/08/2018   Body mass index 45.0-49.9, adult (HCC) 10/08/2018   LGSIL on Pap smear of cervix 05/30/2016   Surveillance for birth control, oral contraceptives 05/30/2016   Family planning 05/30/2016   Screening for colorectal cancer 05/30/2016   Vaginal  irritation 08/29/2013   BV (bacterial vaginosis) 08/29/2013   Essential hypertension 03/21/2013   Contraceptive management 03/21/2013   Home Medication(s) Prior to Admission medications  Medication Sig Start Date End Date Taking? Authorizing Provider  albuterol  (VENTOLIN  HFA) 108 (90 Base) MCG/ACT inhaler Inhale 2 puffs into the lungs every  4 (four) hours as needed for wheezing or shortness of breath. 05/13/22   Stuart Vernell Norris, PA-C  amLODipine  (NORVASC ) 2.5 MG tablet Take 2.5 mg by mouth daily. 10/18/21   [provider]  cetirizine  (ZYRTEC ) 10 MG tablet Take 1 tablet (10 mg total) by mouth daily. 08/09/21   Leath-Warren, Etta PARAS, NP  Cyanocobalamin (B-12) 100 MCG TABS Take 1 tablet by mouth daily.    [provider]  famotidine (PEPCID) 40 MG tablet Take 40 mg by mouth daily.    [provider]  fluconazole  (DIFLUCAN ) 150 MG tablet Take 1 now and 1 in 3 days 03/25/24   Signa Delon LABOR, NP  fluticasone (FLONASE) 50 MCG/ACT nasal spray Place 1 spray into both nostrils daily as needed.    [provider]  hydrOXYzine  (VISTARIL ) 25 MG capsule Take 25 mg by mouth 3 (three) times daily as needed.    [provider]  ipratropium (ATROVENT) 0.06 % nasal spray Place 2 sprays into both nostrils 4 (four) times daily as needed.    [provider]  megestrol  (MEGACE ) 40 MG tablet Take 3 tabs x 5 days then 2 tabs x 5 days then 1 daily 03/07/24   Griffin, Jennifer A, NP  metoprolol  succinate (TOPROL -XL) 25 MG 24 hr tablet Take 25 mg by mouth daily. 04/23/19   [provider]  Misc Natural Products (ELDERBERRY IMMUNE COMPLEX PO) Take 2 tablets by mouth daily.    [provider]  montelukast (SINGULAIR) 10 MG tablet Take 10 mg by mouth daily. 05/03/21   [provider]  olmesartan (BENICAR) 40 MG tablet Take 40 mg by mouth daily. 11/28/22   [provider]  omeprazole  (PRILOSEC) 40 MG capsule TK 1 C PO QD 03/04/19   Signa Delon LABOR, NP  Pediatric Multiple Vitamins (FLINSTONES GUMMIES OMEGA-3 DHA PO) Take by mouth.    [provider]  Potassium 99 MG TABS Take 1 tablet by mouth daily.    [provider]  pregabalin  (LYRICA ) 200 MG capsule Take 1 capsule (200 mg total) by mouth 3 (three) times daily. 03/07/24   Hilma Hastings, PA-C   Pyridoxine HCl (B-6 PO) Take 1 tablet by mouth daily.    [provider]  tiZANidine  (ZANAFLEX ) 4 MG tablet Take 4 mg by mouth at bedtime. 05/17/23   [provider]                                                                                                                                    Past Surgical History Past Surgical History:  Procedure Laterality Date  CHOLECYSTECTOMY  1987   COLONOSCOPY WITH PROPOFOL  N/A 07/30/2021   Procedure: COLONOSCOPY WITH PROPOFOL ;  Surgeon: Cindie Carlin POUR, DO;  [facility];  Service: Endoscopy;  Laterality: N/A;  2:45pm   I&D lumbar, CSF leak repair, lumbar drain  07/21/2022   Dr Melonie   LUMBAR LAMINECTOMY/DECOMPRESSION MICRODISCECTOMY Left 06/15/2022   Procedure: LEFT L3-4 MICRODISCECTOMY;  Surgeon: Clois Fret, MD;  Location: [facility] ORS;  Service: Neurosurgery;  Laterality: Left;   LUMBAR WOUND DEBRIDEMENT N/A 07/01/2022   Procedure: LUMBAR WOUND DEBRIDEMENT;  Surgeon: Clois Fret, MD;  Location: [facility] ORS;  Service: Neurosurgery;  Laterality: N/A;   POLYPECTOMY  07/30/2021   Procedure: POLYPECTOMY;  Surgeon: Cindie Carlin POUR, DO;  Location: [facility];  Service: Endoscopy;;   Family History Family History  Problem Relation Age of Onset   Hypertension Mother    Hyperlipidemia Mother    Diabetes Mother    Hyperlipidemia Father    Hypertension Father    Diabetes Father    Seizures Father    Diabetes Sister    Hypertension Sister    Diabetes Sister    Hypertension Sister    Hypertension Sister    Diabetes Brother    Hypertension Brother    Asthma Daughter    Asthma Son    Cancer Maternal Aunt        breast   Colon cancer Neg Hx    Colon polyps Neg Hx     Social History Social History[1] Allergies Penicillins, Codeine, and Sulfa antibiotics  Review of Systems Review of Systems  All other systems reviewed and are negative.   Physical Exam Vital Signs  I have reviewed the  triage vital signs BP (!) 160/76   Pulse 65   Temp 97.8 F (36.6 C) (Temporal)   Resp 18   Ht 5' 6 (1.676 m)   Wt 108.9 kg   LMP 03/12/2024 (Exact Date)   SpO2 100%   BMI 38.74 kg/m  Physical Exam Vitals and nursing note reviewed.  Constitutional:      Appearance: Normal appearance.  HENT:     Head: Normocephalic and atraumatic.     Mouth/Throat:     Mouth: Mucous membranes are moist.  Eyes:     Conjunctiva/sclera: Conjunctivae normal.  Cardiovascular:     Rate and Rhythm: Normal rate.  Pulmonary:     Effort: Pulmonary effort is normal. No respiratory distress.  Abdominal:     General: Abdomen is flat.  Musculoskeletal:        General: No deformity.     Comments: Grip strength equal, no objective swelling.  Positive Phalen sign, negative Tinel's sign.  No swelling to the digits, erythema range of motion of fingers intact  Skin:    General: Skin is warm and dry.     Capillary Refill: Capillary refill takes less than 2 seconds.  Neurological:     General: No focal deficit present.     Mental Status: She is alert. Mental status is at baseline.  Psychiatric:        Mood and Affect: Mood normal.        Behavior: Behavior normal.     ED Results and Treatments Labs (all labs ordered are listed, but only abnormal results are displayed) Labs Reviewed - No data to display  Radiology No results found.  Pertinent labs & imaging results that were available during my care of the patient were reviewed by me and considered in my medical decision making (see MDM for details).  Medications Ordered in ED Medications  ketorolac  (TORADOL ) 15 MG/ML injection 15 mg (has no administration in time range)                                                                                                                                      Procedures Procedures  (including critical care time)  Medical Decision Making / ED Course   MDM:  48 presenting with bilateral hand pain.  Symptoms seem most consistent with carpal tunnel syndrome.  No objective abnormality of the hands appreciated to suggest process such as infection or fracture.  No history of trauma.  She also describes symptoms that seem somewhat consistent with trigger finger of her right middle finger but objectively have signs of this currently.  She reports it happens intermittently.  She does follow with orthopedic surgeon locally so recommended she follow-up with them. Will give wrist brace . She denies any neck pain or neck injury to suggest any cervical cause of symptoms. Will discharge patient to home. All questions answered. Patient comfortable with plan of discharge. Return precautions discussed with patient and specified on the after visit summary.       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    Final Clinical Impression(s) / ED Diagnoses Final diagnoses:  Bilateral carpal tunnel syndrome  Trigger middle finger of right hand     This chart was dictated using voice  recognition software.  Despite best efforts to proofread,  errors can occur which can change the documentation meaning.     [1]  Social History Tobacco Use   Smoking status: Never   Smokeless tobacco: Never  Vaping Use   Vaping status: Never Used  Substance Use Topics   Alcohol use: No   Drug use: No     Francesca Elsie CROME, MD 04/13/24 1720  "

## 2024-04-15 ENCOUNTER — Encounter: Payer: Self-pay | Admitting: *Deleted

## 2024-04-15 ENCOUNTER — Telehealth: Payer: Self-pay | Admitting: Orthopedic Surgery

## 2024-04-15 DIAGNOSIS — M961 Postlaminectomy syndrome, not elsewhere classified: Secondary | ICD-10-CM

## 2024-04-15 DIAGNOSIS — Z9889 Other specified postprocedural states: Secondary | ICD-10-CM

## 2024-04-15 DIAGNOSIS — M5416 Radiculopathy, lumbar region: Secondary | ICD-10-CM

## 2024-04-15 MED ORDER — PREGABALIN 200 MG PO CAPS: 200.0000 mg | ORAL_CAPSULE | Freq: Three times a day (TID) | ORAL | 0 refills | Status: DC

## 2024-04-15 NOTE — Telephone Encounter (Signed)
 Patient is calling to request a refill of Lyrica  200mg  be sent to [facility] in [place].

## 2024-04-15 NOTE — Telephone Encounter (Signed)
 Last filled on 03/07/24 #90-was sent to [facility] last time but she has had hard time with that pharmacy and wants to use [facility] in [place] LOV 02/29/24 NO future appointments

## 2024-04-15 NOTE — Telephone Encounter (Signed)
 Last phone visit with me on 02/29/24. PCP will not prescribe lyrica . She will need to be seen sometime around April/May for recheck.   Refill of lyrica  sent to pharmacy. PMP reviewed and is appropriate.   Please let her know.

## 2024-04-26 ENCOUNTER — Ambulatory Visit: Admitting: Orthopedic Surgery

## 2024-04-26 ENCOUNTER — Encounter: Payer: Self-pay | Admitting: Urology

## 2024-04-26 ENCOUNTER — Encounter: Payer: Self-pay | Admitting: Orthopedic Surgery

## 2024-04-26 ENCOUNTER — Ambulatory Visit: Admitting: Urology

## 2024-04-26 VITALS — BP 132/84 | HR 63

## 2024-04-26 VITALS — Ht 66.0 in | Wt 244.2 lb

## 2024-04-26 DIAGNOSIS — G5603 Carpal tunnel syndrome, bilateral upper limbs: Secondary | ICD-10-CM

## 2024-04-26 DIAGNOSIS — N2889 Other specified disorders of kidney and ureter: Secondary | ICD-10-CM | POA: Diagnosis not present

## 2024-04-26 DIAGNOSIS — M65331 Trigger finger, right middle finger: Secondary | ICD-10-CM

## 2024-04-26 LAB — URINALYSIS, ROUTINE W REFLEX MICROSCOPIC
Bilirubin, UA: NEGATIVE
Glucose, UA: NEGATIVE
Ketones, UA: NEGATIVE
Leukocytes,UA: NEGATIVE
Nitrite, UA: NEGATIVE
Protein,UA: NEGATIVE
RBC, UA: NEGATIVE
Specific Gravity, UA: 1.01 (ref 1.005–1.030)
Urobilinogen, Ur: 1 mg/dL (ref 0.2–1.0)
pH, UA: 6 (ref 5.0–7.5)

## 2024-04-26 MED ORDER — PREDNISONE 10 MG PO TABS: 10.0000 mg | ORAL_TABLET | Freq: Three times a day (TID) | ORAL | 0 refills | Status: DC

## 2024-04-26 MED ORDER — METHYLPREDNISOLONE ACETATE 40 MG/ML IJ SUSP
40.0000 mg | Freq: Once | INTRAMUSCULAR | Status: AC
  Administered 2024-04-26: 40 mg via INTRA_ARTICULAR

## 2024-04-26 MED ORDER — GABAPENTIN 100 MG PO CAPS: 100.0000 mg | ORAL_CAPSULE | Freq: Three times a day (TID) | ORAL | 2 refills | Status: DC

## 2024-04-26 NOTE — Patient Instructions (Signed)
 A Growth in the Kidney (Renal Mass): What to Know  A renal mass is an abnormal growth in the kidney. It may be found during an MRI, CT scan, or ultrasound that's done to check for other problems in the belly. Some renal masses are cancerous, or malignant, and can grow or spread quickly. Others are benign, which means they're not cancer. Renal masses include: Tumors. These may be either cancerous or benign. The most common cancerous tumor in adults is called renal cell carcinoma. In children, the most common type is Wilms tumor. The most common kidney tumors that aren't cancer include renal adenomas, oncocytomas, and angiomyolipomas (AML). Cysts. These are pockets of fluid that form on or in the kidney. What are the causes? Certain types of cancers, infections, or injuries can cause a renal mass. It's not always known what causes a cyst to form in or on the kidney. What are the signs or symptoms? Often, a renal mass or kidney cyst doesn't cause any signs or symptoms. How is this diagnosed? Your health care provider may suggest tests to diagnose the cause of your renal mass. These tests may include: A physical exam. Blood tests. Pee (urine) tests. Imaging tests, such as: CT scan. MRI. Ultrasound. Chest X-ray or bone scan. These may be done if a tumor is cancerous to see if the cancer has spread outside the kidney. Biopsy. This is when a small piece of tissue is removed from the renal mass for testing. How is this treated? Treatment is not always needed for a renal mass. Treatment will depend on the cause of the mass and if it's causing any problems or symptoms. For a cancerous renal mass, treatment options may include: Surgery. This is done to remove the tumor and any affected tissue. Chemotherapy. This uses medicines to kill cancer cells. Radiation. High-energy X-rays or gamma rays are used to kill cancer cells. Ablation. This uses extreme hot or cold temperature to kill the cancer  cells. Immunotherapy. Medicines are used to help the body's defense system (immune system) fight the cancer cells. Taking part in clinical trials. This involves trying new or experimental treatments to see if they're effective. Most kidney cysts don't need to be treated. Follow these instructions at home: What you need to do at home will depend on the cause of the mass. Follow the instructions that your provider gives you. In general: Take medicines only as told. If you were given antibiotics, take them as told. Do not stop taking them even if you start to feel better. Follow any instructions from your provider about what you can and can't do. Do not smoke, vape, or use nicotine or tobacco. Keep all follow-up visits. Your provider will need to check if your renal mass has changed or grown. Contact a health care provider if: You have flank pain, which is pain in your side or back. You have a fever. You have a loss of appetite. You have pain or swelling in your belly. You lose weight. Get help right away if: Your pain gets worse. There's blood in your pee. You can't pee. This information is not intended to replace advice given to you by your health care provider. Make sure you discuss any questions you have with your health care provider. Document Revised: 09/30/2022 Document Reviewed: 09/30/2022 Elsevier Patient Education  2025 [employer].

## 2024-04-26 NOTE — Progress Notes (Signed)
 "  Office Visit Note   Patient: Marie Mills           Date of Birth: 08-08-1970           MRN: [id number] Visit Date: 04/26/2024 Requested by: [name], MD [address] [place],  [place] [address] PCP: [name], MD   Assessment & Plan:   Encounter Diagnoses  Name Primary?   Bilateral carpal tunnel syndrome Yes   Trigger finger, right middle finger     Meds ordered this encounter  Medications   methylPREDNISolone  acetate (DEPO-MEDROL ) injection 40 mg   predniSONE  (DELTASONE ) 10 MG tablet    Sig: Take 1 tablet (10 mg total) by mouth 3 (three) times daily.    Dispense:  42 tablet    Refill:  0   gabapentin  (NEURONTIN ) 100 MG capsule    Sig: Take 1 capsule (100 mg total) by mouth 3 (three) times daily.    Dispense:  90 capsule    Refill:  2   I started her on gabapentin  and prednisone  and told her to wear splint for 6 weeks  We injected the right long/middle finger    Subjective: Chief Complaint  Patient presents with   Hand Pain    Bilat/ Hands hurting for about a month, tingling and feeling like they were asleep. The right is the worst. My fingers get locked at times. Was hurting mostly at night now it is all the time.    HPI: Marie Mills is a 54 year old female with numbness and tingling of both hands and locking and catching of the right long finger.  She presented to the ER with symptoms she was diagnosed with carpal tunnel syndrome and trigger finger phenomenon  She is also scheduled to be seen regarding the procedure and would like not to have surgery  She notices increased symptoms at night and increased symptoms when working              ROS: We did not elicit a history of weakness in the upper extremities   Images personally read and my interpretation : No imaging necessary  Visit Diagnoses:  1. Bilateral carpal tunnel syndrome   2. Trigger finger, right middle finger      Follow-Up Instructions: Return in about 6 weeks (around 06/07/2024) for  FOLLOW UP-CTS B/L AND RT LONG TRIGGER FINGER .    Objective: Vital Signs: Ht 5' 6 (1.676 m)   Wt 244 lb 4 oz (110.8 kg)   LMP 03/12/2024 (Exact Date)   BMI 39.42 kg/m   Physical Exam Vitals and nursing note reviewed.  Constitutional:      Appearance: Normal appearance.  HENT:     Head: Normocephalic and atraumatic.  Eyes:     General: No scleral icterus.       Right eye: No discharge.        Left eye: No discharge.     Extraocular Movements: Extraocular movements intact.     Conjunctiva/sclera: Conjunctivae normal.     Pupils: Pupils are equal, round, and reactive to light.  Cardiovascular:     Rate and Rhythm: Normal rate.     Pulses: Normal pulses.  Skin:    General: Skin is warm and dry.     Capillary Refill: Capillary refill takes less than 2 seconds.  Neurological:     General: No focal deficit present.     Mental Status: She is alert and oriented to person, place, and time.  Psychiatric:  Mood and Affect: Mood normal.        Behavior: Behavior normal.        Thought Content: Thought content normal.        Judgment: Judgment normal.      Ortho Exam  She does have tenderness over the A1 pulley of the right long finger and the left long finger  She has normal 2-point discrimination but decreased sensation in the median nerve distribution of both hands and this also extends into the small finger as well.  Bilaterally  Her grip strength is normal the range of motion is excellent in both wrists  Tenderness over the carpal tunnel mild, carpal tunnel compression test positive  Phalen's test not elicited   Specialty Comments:  No specialty comments available.  Imaging: No results found.   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   H/O microdiscectomy 06/28/2022   Lumbar disc herniation 06/15/2022   Encounter for screening fecal occult blood testing 03/23/2022   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) 02/15/2022   Lumbar radiculopathy 02/15/2022   Spinal stenosis of lumbar region with neurogenic claudication 02/15/2022   Chronic pain syndrome 02/15/2022   Acute bilateral low back pain with bilateral sciatica 12/30/2021   Headache above the eye region 12/24/2021   Recurrent sinusitis 12/24/2021   Skin excoriation 12/14/2021   Superficial fungus infection of skin 12/06/2021   Vaginal yeast infection 12/06/2021   Vaginal itching 12/06/2021   Vaginal burning 12/06/2021   Visit for suture removal 12/02/2021   Burning with urination 11/16/2021   History of sinusitis 11/12/2021   Nasal septal deviation 11/12/2021   Pain and swelling of right knee 10/29/2021   Encounter for screening colonoscopy 06/29/2021   Screening examination for STD (sexually transmitted disease) 05/06/2021   Epidermal cyst 05/06/2021   Screening mammogram for breast cancer 02/03/2021   Routine general medical examination at a health care facility 02/03/2021   Encounter for gynecological examination with Papanicolaou smear of cervix 02/03/2021   Encounter for surveillance of contraceptive pills 02/03/2021   Anxiety and depression 02/03/2021   Weight gain 07/04/2019   Body mass index 39.0-39.9, adult 05/28/2019   Body mass index 37.0-37.9, adult 04/08/2019   Body mass index 38.0-38.9, adult 02/04/2019   Body mass index 40.0-44.9, adult (HCC) 11/01/2018   Encounter for well woman exam with routine gynecological exam 10/08/2018   Weight loss counseling, encounter for 10/08/2018    Body mass index 45.0-49.9, adult (HCC) 10/08/2018   LGSIL on Pap smear of cervix 05/30/2016   Surveillance for birth control, oral contraceptives 05/30/2016   Family planning 05/30/2016   Screening for colorectal cancer 05/30/2016   Vaginal irritation 08/29/2013   BV (bacterial vaginosis) 08/29/2013   Essential hypertension 03/21/2013   Contraceptive management 03/21/2013   Past Medical History:  Diagnosis Date   Asthma    BV (bacterial vaginosis) 08/29/2013   Contraceptive management 03/21/2013   GERD (gastroesophageal reflux disease)    Hypertension    Lumbar radiculopathy    Nose colonized with  MRSA 06/02/2022   a.) properative PCR (+) prior to LEFT L3-4 MICRODISCECTOMY   Obesity    Pneumonia    Seizure [facility])    age 13-3; unknown cause nor if on medication    Family History  Problem Relation Age of Onset   Hypertension Mother    Hyperlipidemia Mother    Diabetes Mother    Hyperlipidemia Father    Hypertension Father    Diabetes Father    Seizures Father    Diabetes Sister    Hypertension Sister    Diabetes Sister    Hypertension Sister    Hypertension Sister    Diabetes Brother    Hypertension Brother    Asthma Daughter    Asthma Son    Cancer Maternal Aunt        breast   Colon cancer Neg Hx    Colon polyps Neg Hx     Past Surgical History:  Procedure Laterality Date   CHOLECYSTECTOMY  1987   COLONOSCOPY WITH PROPOFOL  N/A 07/30/2021   Procedure: COLONOSCOPY WITH PROPOFOL ;  Surgeon: Cindie Carlin POUR, DO;  [facility];  Service: Endoscopy;  Laterality: N/A;  2:45pm   I&D lumbar, CSF leak repair, lumbar drain  07/21/2022   Dr Melonie   LUMBAR LAMINECTOMY/DECOMPRESSION MICRODISCECTOMY Left 06/15/2022   Procedure: LEFT L3-4 MICRODISCECTOMY;  Surgeon: Clois Fret, MD;  Location: [facility];  Service: Neurosurgery;  Laterality: Left;   LUMBAR WOUND DEBRIDEMENT N/A 07/01/2022   Procedure: LUMBAR WOUND DEBRIDEMENT;  Surgeon: Clois Fret, MD;  Location: [facility] OR[facility];  Service: Neurosurgery;  Laterality: N/A;   POLYPECTOMY  07/30/2021   Procedure: POLYPECTOMY;  Surgeon: Cindie Carlin POUR, DO;  [facility];  Service: Endoscopy;;   Social History   Occupational History   Not on file  Tobacco Use   Smoking status: Never   Smokeless tobacco: Never  Vaping Use   Vaping status: Never Used  Substance and Sexual Activity   Alcohol use: No   Drug use: No   Sexual activity: Not Currently    Birth control/protection: None, Abstinence      "

## 2024-04-26 NOTE — Progress Notes (Unsigned)
 "  04/26/2024 11:05 AM   Raoul Nap Sep 01, 1970 [id number]  Referring provider: Hilma Hastings, PA-C [address] [place],  [place] [address]  Renal mass   HPI: Ms Marie Mills is a 54yo here for evaluation of a right renal mass. CT 11/12 shows an endophytic posterior right lower to mid pole mass. She denie snay family history of renal cancer.s She denies any hematuria. 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   a.) properative PCR (+) prior to LEFT L3-4 MICRODISCECTOMY   Obesity    Pneumonia    Seizure [facility])    age 77-3; unknown cause nor if on medication    Surgical History: Past Surgical History:  Procedure Laterality Date   CHOLECYSTECTOMY  1987   COLONOSCOPY WITH PROPOFOL  N/A 07/30/2021   Procedure: COLONOSCOPY WITH PROPOFOL ;  Surgeon: Cindie Carlin POUR, DO;  [facility];  Service: Endoscopy;  Laterality: N/A;  2:45pm   I&D lumbar, CSF leak repair, lumbar drain  07/21/2022   Dr Melonie   LUMBAR LAMINECTOMY/DECOMPRESSION MICRODISCECTOMY Left 06/15/2022   Procedure: LEFT L3-4 MICRODISCECTOMY;  Surgeon: Clois Fret, MD;  Location: [facility] ORS;  Service: Neurosurgery;  Laterality: Left;   LUMBAR WOUND DEBRIDEMENT N/A 07/01/2022   Procedure: LUMBAR WOUND DEBRIDEMENT;  Surgeon: Clois Fret, MD;  Location: [facility] ORS;  Service: Neurosurgery;  Laterality: N/A;   POLYPECTOMY  07/30/2021   Procedure: POLYPECTOMY;  Surgeon: Cindie Carlin POUR, DO;  [facility];  Service: Endoscopy;;    Home Medications:  Allergies as of 04/26/2024       Reactions   Penicillins Anaphylaxis   Has patient had a PCN reaction causing immediate rash, facial/tongue/throat swelling, SOB or lightheadedness with  hypotension: Yes Has patient had a PCN reaction causing severe rash involving mucus membranes or skin necrosis: No Has patient had a PCN reaction that required hospitalization Yes Has patient had a PCN reaction occurring within the last 10 years: No If all of the above answers are NO, then may proceed with Cephalosporin use.   Codeine Other (See Comments)   Chest Pain   Sulfa Antibiotics Hives        Medication List        Accurate as of April 26, 2024 11:05 AM. If you have any questions, ask your nurse or doctor.          albuterol  108 (90 Base) MCG/ACT inhaler Commonly known as: VENTOLIN  HFA Inhale 2 puffs into the lungs every 4 (four) hours as needed for wheezing or shortness of breath.   amLODipine  2.5 MG tablet Commonly known as: NORVASC  Take 2.5 mg by mouth daily.   B-12 100 MCG Tabs Take 1 tablet by mouth daily.   B-6 PO Take 1 tablet by mouth daily.   cetirizine  10 MG tablet Commonly known as: ZYRTEC  Take 1 tablet (10 mg total) by mouth daily.   ELDERBERRY IMMUNE COMPLEX PO Take 2 tablets by mouth daily.   famotidine 40 MG tablet Commonly known as: PEPCID Take 40 mg by mouth daily.   FLINSTONES GUMMIES OMEGA-3 DHA PO Take by mouth.   fluconazole  150 MG tablet Commonly known as: DIFLUCAN  Take 1 now and 1 in 3 days   fluticasone 50 MCG/ACT nasal spray Commonly known as:  FLONASE Place 1 spray into both nostrils daily as needed.   hydrOXYzine  25 MG capsule Commonly known as: VISTARIL  Take 25 mg by mouth 3 (three) times daily as needed.   ipratropium 0.06 % nasal spray Commonly known as: ATROVENT Place 2 sprays into both nostrils 4 (four) times daily as needed.   megestrol  40 MG tablet Commonly known as: MEGACE  Take 3 tabs x 5 days then 2 tabs x 5 days then 1 daily   metoprolol  succinate 25 MG 24 hr tablet Commonly known as: TOPROL -XL Take 25 mg by mouth daily.   montelukast 10 MG tablet Commonly known as: SINGULAIR Take 10 mg by mouth  daily.   olmesartan 40 MG tablet Commonly known as: BENICAR Take 40 mg by mouth daily.   omeprazole  40 MG capsule Commonly known as: PRILOSEC TK 1 C PO QD   Potassium 99 MG Tabs Take 1 tablet by mouth daily.   pregabalin  200 MG capsule Commonly known as: LYRICA  Take 1 capsule (200 mg total) by mouth 3 (three) times daily.   tiZANidine  4 MG tablet Commonly known as: ZANAFLEX  Take 4 mg by mouth at bedtime.        Allergies: Allergies[1]  Family History: Family History  Problem Relation Age of Onset   Hypertension Mother    Hyperlipidemia Mother    Diabetes Mother    Hyperlipidemia Father    Hypertension Father    Diabetes Father    Seizures Father    Diabetes Sister    Hypertension Sister    Diabetes Sister    Hypertension Sister    Hypertension Sister    Diabetes Brother    Hypertension Brother    Asthma Daughter    Asthma Son    Cancer Maternal Aunt        breast   Colon cancer Neg Hx    Colon polyps Neg Hx     Social History:  reports that she has never smoked. She has never used smokeless tobacco. She reports that she does not drink alcohol and does not use drugs.  ROS: All other review of systems were reviewed and are negative except what is noted above in HPI  Physical Exam: BP 132/84   Pulse 63   LMP 03/12/2024 (Exact Date)   Constitutional:  Alert and oriented, No acute distress. HEENT: [place] AT, moist mucus membranes.  Trachea midline, no masses. Cardiovascular: No clubbing, cyanosis, or edema. Respiratory: Normal respiratory effort, no increased work of breathing. GI: Abdomen is soft, nontender, nondistended, no abdominal masses GU: No CVA tenderness.  Lymph: No cervical or inguinal lymphadenopathy. Skin: No rashes, bruises or suspicious lesions. Neurologic: Grossly intact, no focal deficits, moving all 4 extremities. Psychiatric: Normal mood and affect.  Laboratory Data: Lab Results  Component Value Date   WBC 5.9 03/06/2024   HGB 12.8  03/06/2024   HCT 41.6 03/06/2024   MCV 85.1 03/06/2024   PLT 345 03/06/2024    Lab Results  Component Value Date   CREATININE 0.80 04/08/2024    No results found for: PSA  No results found for: TESTOSTERONE  No results found for: HGBA1C  Urinalysis    Component Value Date/Time   COLORURINE YELLOW (A) 06/02/2022 0943   APPEARANCEUR CLEAR (A) 06/02/2022 0943   APPEARANCEUR Turbid (A) 12/06/2021 1430   LABSPEC 1.027 06/02/2022 0943   PHURINE 5.0 06/02/2022 0943   GLUCOSEU NEGATIVE 06/02/2022 0943   HGBUR NEGATIVE 06/02/2022 0943   BILIRUBINUR NEGATIVE 06/02/2022 0943   BILIRUBINUR Negative 12/06/2021  1430   KETONESUR NEGATIVE 06/02/2022 0943   PROTEINUR NEGATIVE 06/02/2022 0943   NITRITE NEGATIVE 06/02/2022 0943   LEUKOCYTESUR NEGATIVE 06/02/2022 9056    Lab Results  Component Value Date   LABMICR See below: 12/06/2021   WBCUA None seen 12/06/2021   LABEPIT 0-10 12/06/2021   BACTERIA NONE SEEN 01/01/2022    Pertinent Imaging: CT 02/28/2024: Images reviewed and discussed with the patient No results found for this or any previous visit.  Results for orders placed during the hospital encounter of 09/09/22  US  Venous Img Lower Bilateral (DVT)  Narrative CLINICAL DATA:  Bilateral lower extremity edema. Patient status post back surgery 07/21/2022.  EXAM: BILATERAL LOWER EXTREMITY VENOUS DOPPLER ULTRASOUND  TECHNIQUE: Gray-scale sonography with compression, as well as color and duplex ultrasound, were performed to evaluate the deep venous system(s) from the level of the common femoral vein through the popliteal and proximal calf veins.  COMPARISON:  None Available.  FINDINGS: VENOUS  Normal compressibility of the common femoral, superficial femoral, and popliteal veins, as well as the visualized calf veins. Visualized portions of profunda femoral vein and great saphenous vein unremarkable. No filling defects to suggest DVT on grayscale or color  Doppler imaging. Doppler waveforms show normal direction of venous flow, normal respiratory plasticity and response to augmentation.  Limited views of the contralateral common femoral vein are unremarkable.  OTHER  None.  Limitations: None.  IMPRESSION: Negative for DVT.  Negative exam.   Electronically Signed By: Debby Prader M.D. On: 09/09/2022 10:09  No results found for this or any previous visit.  No results found for this or any previous visit.  No results found for this or any previous visit.  No results found for this or any previous visit.  No results found for this or any previous visit.  No results found for this or any previous visit.   Assessment & Plan:    1. Renal mass (Primary) We discussed the natural hx of renal masses and the 80/20 malignant/benign likelihood. We disucssed the treatment options including active surveillance. Renal ablation, partial and radical nephrectomy. After discussing the options the patient elects for active surveillance. Followup 6 months with renal US  - Urinalysis, Routine w reflex microscopic   No follow-ups on file.  Belvie Clara, MD  [facility] [place]       [1]  Allergies Allergen Reactions   Penicillins Anaphylaxis    Has patient had a PCN reaction causing immediate rash, facial/tongue/throat swelling, SOB or lightheadedness with hypotension: Yes Has patient had a PCN reaction causing severe rash involving mucus membranes or skin necrosis: No Has patient had a PCN reaction that required hospitalization Yes Has patient had a PCN reaction occurring within the last 10 years: No If all of the above answers are NO, then may proceed with Cephalosporin use.    Codeine Other (See Comments)    Chest Pain   Sulfa Antibiotics Hives   "

## 2024-04-29 ENCOUNTER — Telehealth: Payer: Self-pay | Admitting: Orthopedic Surgery

## 2024-04-29 NOTE — Addendum Note (Signed)
 Addended by: MARGRETTE TAFT BRAVO on: 04/29/2024 03:41 PM   Modules accepted: Level of Service

## 2024-04-29 NOTE — Telephone Encounter (Signed)
 Dr. Areatha pt - spoke w/the pt, she stated she was seen last week and that Dr. VEAR prescribed Gabapentin  and she is also taking Pregabalin  for her back.  The pharmacy told her she can't take them together/same time.  She wants to know if Dr. VEAR wants to prescribe something different.  She uses [facility].

## 2024-05-01 ENCOUNTER — Inpatient Hospital Stay: Attending: Oncology | Admitting: Oncology

## 2024-05-01 ENCOUNTER — Other Ambulatory Visit: Payer: Self-pay

## 2024-05-01 ENCOUNTER — Ambulatory Visit (HOSPITAL_COMMUNITY)
Admission: RE | Admit: 2024-05-01 | Discharge: 2024-05-01 | Disposition: A | Discharge status: Home / Self Care | Source: Ambulatory Visit | Attending: Urology | Admitting: Urology

## 2024-05-01 ENCOUNTER — Telehealth: Payer: Self-pay

## 2024-05-01 VITALS — BP 159/80 | HR 68 | Temp 97.7°F | Resp 16 | Ht 66.0 in | Wt 244.6 lb

## 2024-05-01 DIAGNOSIS — N2889 Other specified disorders of kidney and ureter: Secondary | ICD-10-CM

## 2024-05-01 DIAGNOSIS — R911 Solitary pulmonary nodule: Secondary | ICD-10-CM | POA: Diagnosis not present

## 2024-05-01 NOTE — Telephone Encounter (Signed)
 Patient states that she is on Pregabalin  200 MG  3x daily for her back. Stated that she can't take the Gabapentin  because she isn't suppose to take both at the same time, so she is asking if you will send her something different in.  PATIENT USE [facility]

## 2024-05-01 NOTE — Patient Instructions (Signed)
 [facility]  Discharge Instructions  You were seen and examined today by Dr. Davonna. Dr. Davonna is a medical oncologist, meaning that she specializes in the treatment of cancer diagnoses. Dr. Davonna discussed your past medical history, family history of cancers, and the events that led to you being here today.  You were referred to Dr. Davonna because of a new diagnosis of a renal mass that is most consistent with renal cell carcinoma. Dr. Sherrilee can monitor you closely with routine imaging.  Call the Cancer Center if you need oncology care in the future.  Thank you for choosing [facility] to provide your oncology and hematology care.   To afford each patient quality time with our provider, please arrive at least 15 minutes before your scheduled appointment time. You may need to reschedule your appointment if you arrive late (10 or more minutes). Arriving late affects you and other patients whose appointments are after yours.  Also, if you miss three or more appointments without notifying the office, you may be dismissed from the clinic at the providers discretion.    Again, thank you for choosing [facility].  Our hope is that these requests will decrease the amount of time that you wait before being seen by our physicians.   If you have a lab appointment with the Cancer Center - please note that after April 8th, all labs will be drawn in the cancer center.  You do not have to check in or register with the main entrance as you have in the past but will complete your check-in at the cancer center.            _____________________________________________________________  Should you have questions after your visit to [facility], please contact our office at [phone] and follow the prompts.  Our office hours are 8:00 a.m. to 4:30 p.m. Monday - Thursday and 8:00 a.m. to 2:30 p.m. Friday.  Please note that  voicemails left after 4:00 p.m. may not be returned until the following business day.  We are closed weekends and all major holidays.  You do have access to a nurse 24-7, just call the main number to the clinic [phone] and do not press any options, hold on the line and a nurse will answer the phone.    For prescription refill requests, have your pharmacy contact our office and allow 72 hours.    Masks are no longer required in the cancer centers. If you would like for your care team to wear a mask while they are taking care of you, please let them know. You may have one support person who is at least 52 years old accompany you for your appointments.

## 2024-05-01 NOTE — Progress Notes (Signed)
 "  Hematology-Oncology Clinic Note  [name], MD   Reason for Referral: Renal mass  Oncology History: I have reviewed her chart and materials related to her cancer extensively and collaborated history with the patient. Summary of oncologic history is as follows:  Diagnosis: Renal mass  -09/30/2023: MRI Thoracic Spine: Atypical posterior right renal lesion. Correlation with CT with and without contrast to ensure no suspicious enhancement. -02/28/2024: CT abdomen: Enhancing 2.2 cm posterior interpolar right renal mass, compatible with solid renal neoplasm such as renal cell carcinoma. Single right renal artery and vein. No renal vein invasion. -04/08/2024: CT chest: No evidence for metastatic disease in the chest. 3 mm noncalcified perifissural nodule in the anterior right lung is probably a subpleural lymph node.  -04/26/2024: Seen by Dr.McKenzie and decided to opt for observation. Will continue to follow up urology.    History of Presenting Illness: Discussed the use of AI scribe software for clinical note transcription with the patient, who gave verbal consent to proceed.  History of Present Illness Marie Mills is a 54 year old female with right renal mass and  a stable pulmonary nodule who presents for hematology/oncology follow-up .She is accompanied by her husband today.   She has a pulmonary nodule that was first identified in 2012 and has remained stable in size and appearance on serial imaging, including the most recent CT chest which demonstrated a very small, unchanged nodule. She is asymptomatic, with no pain or respiratory complaints, and denies any new symptoms.  She has not undergone surgical intervention but has met with Dr.Mckenzie and previously elected to defer surgery in favor of ongoing surveillance with interval imaging. 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   a.) properative PCR (+) prior to LEFT L3-4 MICRODISCECTOMY   Obesity    Pneumonia    Seizure [facility])    age 65-3; unknown cause nor if on medication    Surgical history: Past Surgical History:  Procedure Laterality Date   CHOLECYSTECTOMY  1987   COLONOSCOPY WITH PROPOFOL  N/A 07/30/2021   Procedure: COLONOSCOPY WITH PROPOFOL ;  Surgeon: Cindie Carlin POUR, DO;  [facility];  Service: Endoscopy;  Laterality: N/A;  2:45pm   I&D lumbar, CSF leak repair, lumbar drain  07/21/2022   Dr Melonie   LUMBAR LAMINECTOMY/DECOMPRESSION MICRODISCECTOMY Left 06/15/2022   Procedure: LEFT L3-4 MICRODISCECTOMY;  Surgeon: Clois Fret, MD;  Location: [facility];  Service: Neurosurgery;  Laterality: Left;   LUMBAR WOUND DEBRIDEMENT N/A 07/01/2022   Procedure: LUMBAR WOUND DEBRIDEMENT;  Surgeon: Clois Fret, MD;  Location: [facility];  Service: Neurosurgery;  Laterality: N/A;   POLYPECTOMY  07/30/2021   Procedure: POLYPECTOMY;  Surgeon: Cindie Carlin POUR, DO;  [facility];  Service: Endoscopy;;     Allergies:  is allergic to penicillins, codeine, and sulfa antibiotics.  Medications:  Current Outpatient Medications  Medication Sig Dispense Refill   albuterol  (VENTOLIN  HFA) 108 (90 Base) MCG/ACT inhaler Inhale 2 puffs into the lungs every 4 (four) hours as needed for wheezing or shortness of breath. 18 g 0   amLODipine  (NORVASC ) 2.5 MG tablet Take 2.5 mg by mouth daily.     cetirizine  (ZYRTEC ) 10 MG tablet Take  1 tablet (10 mg total) by mouth daily. 30 tablet 0   Cyanocobalamin (B-12) 100 MCG TABS Take 1 tablet by mouth daily.     famotidine (PEPCID) 40 MG tablet Take 40 mg by mouth daily.     fluconazole  (DIFLUCAN ) 150 MG tablet Take 1 now and 1  in 3 days 2 tablet 1   fluticasone (FLONASE) 50 MCG/ACT nasal spray Place 1 spray into both nostrils daily as needed.     gabapentin  (NEURONTIN ) 100 MG capsule Take 1 capsule (100 mg total) by mouth 3 (three) times daily. 90 capsule 2   hydrOXYzine  (VISTARIL ) 25 MG capsule Take 25 mg by mouth 3 (three) times daily as needed.     ipratropium (ATROVENT) 0.06 % nasal spray Place 2 sprays into both nostrils 4 (four) times daily as needed.     levocetirizine (XYZAL) 5 MG tablet Take 5 mg by mouth daily.     metoprolol  succinate (TOPROL -XL) 25 MG 24 hr tablet Take 25 mg by mouth daily.     Misc Natural Products (ELDERBERRY IMMUNE COMPLEX PO) Take 2 tablets by mouth daily.     montelukast (SINGULAIR) 10 MG tablet Take 10 mg by mouth daily.     olmesartan (BENICAR) 40 MG tablet Take 40 mg by mouth daily.     omeprazole  (PRILOSEC) 40 MG capsule TK 1 C PO QD 30 capsule 6   Pediatric Multiple Vitamins (FLINSTONES GUMMIES OMEGA-3 DHA PO) Take by mouth.     Potassium 99 MG TABS Take 1 tablet by mouth daily.     predniSONE  (DELTASONE ) 10 MG tablet Take 1 tablet (10 mg total) by mouth 3 (three) times daily. 42 tablet 0   pregabalin  (LYRICA ) 200 MG capsule Take 1 capsule (200 mg total) by mouth 3 (three) times daily. 90 capsule 0   Pyridoxine HCl (B-6 PO) Take 1 tablet by mouth daily.     tiZANidine  (ZANAFLEX ) 4 MG tablet Take 4 mg by mouth at bedtime.     No current facility-administered medications for this visit.     Physical Examination: ECOG PERFORMANCE STATUS: 0 - Asymptomatic  Vitals:   05/01/24 1349  BP: (!) 159/80  Pulse: 68  Resp: 16  Temp: 97.7 F (36.5 C)  SpO2: 98%   Filed Weights   05/01/24 1349  Weight: 244 lb 9.6 oz (110.9 kg)    GENERAL:alert, no distress and comfortable SKIN: skin color, texture, turgor are normal, no rashes or significant lesions EYES: normal, conjunctiva are pink and non-injected, sclera clear OROPHARYNX:no exudate, no erythema and lips, buccal mucosa,  and tongue normal  NECK: supple, thyroid  normal size, non-tender, without nodularity LYMPH:  no palpable lymphadenopathy in the cervical, axillary or inguinal LUNGS: clear to auscultation and percussion with normal breathing effort HEART: regular rate & rhythm and no murmurs and no lower extremity edema ABDOMEN:abdomen soft, non-tender and normal bowel sounds Musculoskeletal:no cyanosis of digits and no clubbing  PSYCH: alert & oriented x 3 with fluent speech NEURO: no focal motor/sensory deficits   Laboratory Data: I have reviewed the data as listed Lab Results  Component Value Date   WBC 5.9 03/06/2024   HGB 12.8 03/06/2024   HCT 41.6 03/06/2024   MCV 85.1 03/06/2024   PLT 345 03/06/2024    @LASTCHEMISTRY @   Radiographic Studies: I have personally reviewed the radiological images as listed and agreed with the findings in the report.  CT Chest W Contrast CLINICAL DATA:  Staging for renal cell carcinoma. * Tracking Code: BO *  EXAM: CT CHEST WITH CONTRAST  TECHNIQUE: Multidetector CT imaging of the chest was performed during intravenous contrast administration.  RADIATION DOSE REDUCTION: This exam was performed according to the departmental dose-optimization program which includes automated exposure control, adjustment of the mA and/or kV according to patient size and/or use of iterative reconstruction technique.  CONTRAST:  80mL OMNIPAQUE  IOHEXOL  300 MG/ML  SOLN  COMPARISON:  06/09/2015  FINDINGS: Cardiovascular: The heart size is normal. No substantial pericardial effusion. No thoracic aortic aneurysm. No substantial atherosclerotic calcification of the thoracic aorta.  Mediastinum/Nodes: No mediastinal lymphadenopathy. There is no hilar lymphadenopathy. The esophagus has normal imaging features. There is no axillary lymphadenopathy.  Lungs/Pleura: No overtly suspicious pulmonary nodule or mass. 3 mm noncalcified perifissural nodule in the anterior right  lung on 80/4 is probably a subpleural lymph node. Several scattered tiny calcified granulomata identified including in the lingula on 101/4 and right lower lobe on 107/4. No focal airspace consolidation. There is no evidence of pleural effusion.  Upper Abdomen: Visualized portion of the upper abdomen shows no acute findings.  Musculoskeletal: No worrisome lytic or sclerotic osseous abnormality. Healed left-sided rib fractures evident.  IMPRESSION: 1. No evidence for metastatic disease in the chest. 2. 3 mm noncalcified perifissural nodule in the anterior right lung is probably a subpleural lymph node. Attention on follow-up recommended.  Electronically Signed   By: Camellia Candle M.D.   On: 04/12/2024 08:48    ASSESSMENT & PLAN:  Patient is a 54 y.o. female presenting for right renal mass  Assessment and Plan Assessment & Plan  Right renal mass Patient seen DR.McKenzie for this and elected for surveillance at this time No evidence of metastasis on the recent scans.   - Ongoing surveillance with serial imaging managed by Dr.McKenzie - Ultrasound to be performed today as recommended by Dr.McKenzie - Hematology/oncology follow-up not indicated unless surgical intervention or further evaluation is recommended by her urologist - Encouraged contact if oncology evaluation is recommended by her other provider.  Pulmonary nodule Chronic, stable pulmonary nodule since 2012, likely benign with no malignancy evidence. No new symptoms or concerning features.  - Reviewed recent chest CT showing stable nodule. - Discussed benign nature and stability since 2012.    No orders of the defined types were placed in this encounter.   The total time spent in the appointment was 30 minutes encounter with patients including review of chart and various tests results, discussions about plan of care and coordination of care plan   All questions were answered. The patient knows to call the  clinic with any problems, questions or concerns. No barriers to learning was detected.   LILLETTE Verneta SAUNDERS Teague,acting as a neurosurgeon for Mickiel Dry, MD.,have documented all relevant documentation on the behalf of Mickiel Dry, MD,as directed by  Mickiel Dry, MD while in the presence of Mickiel Dry, MD.  I, Mickiel Dry MD, have reviewed the above documentation for accuracy and completeness, and I agree with the above.    Mickiel Dry, MD 1/15/20266:13 AM "

## 2024-05-02 ENCOUNTER — Other Ambulatory Visit: Payer: Self-pay | Admitting: Orthopedic Surgery

## 2024-05-02 NOTE — Telephone Encounter (Signed)
 I called to advise. She voiced understanding

## 2024-05-06 NOTE — Progress Notes (Signed)
 Rapid Diagnostic Service for Malignancy  Work-up Status:  On hold.  Situation:  Patient followed up with urology regarding renal mass on 04/26/24.  Patient chose to hold off on biopsy and monitor renal mass with active surveillance and follow up with urology in 6 months.    Background:  Patient has a stable pulmonary nodule.   Assessment: Patient is asymptomatic.  Urology will monitor.  Response:  No further oncology work up needed at this time per Dr. Davonna.  Dena GORMAN Daring, RN  Oncology Nurse Navigator, Rapid Diagnostic Service for Malignancy 05/06/24 12:00 PM

## 2024-05-07 ENCOUNTER — Ambulatory Visit: Payer: Self-pay

## 2024-05-14 ENCOUNTER — Ambulatory Visit: Admitting: Student in an Organized Health Care Education/Training Program

## 2024-05-14 ENCOUNTER — Other Ambulatory Visit: Payer: Self-pay | Admitting: Orthopedic Surgery

## 2024-05-14 ENCOUNTER — Telehealth: Payer: Self-pay | Admitting: Orthopedic Surgery

## 2024-05-14 DIAGNOSIS — M961 Postlaminectomy syndrome, not elsewhere classified: Secondary | ICD-10-CM

## 2024-05-14 DIAGNOSIS — Z9889 Other specified postprocedural states: Secondary | ICD-10-CM

## 2024-05-14 DIAGNOSIS — M5416 Radiculopathy, lumbar region: Secondary | ICD-10-CM

## 2024-05-14 DIAGNOSIS — G5603 Carpal tunnel syndrome, bilateral upper limbs: Secondary | ICD-10-CM

## 2024-05-14 DIAGNOSIS — M65331 Trigger finger, right middle finger: Secondary | ICD-10-CM

## 2024-05-15 ENCOUNTER — Telehealth: Payer: Self-pay | Admitting: Orthopedic Surgery

## 2024-05-15 NOTE — Telephone Encounter (Signed)
 Patient is calling to request a refill of Lyrica  200mg  be sent to [facility] in [place].

## 2024-05-15 NOTE — Telephone Encounter (Signed)
 See other phone note-medication was refilled today already and we called and left a message to let her know. Patient advised

## 2024-05-15 NOTE — Telephone Encounter (Signed)
 She has f/u with me on 4/28.   PMP reviewed and is appropriate. Lyrica  refill sent to pharmacy. Please let her know.

## 2024-05-15 NOTE — Telephone Encounter (Signed)
 Patient notified by voicemail.

## 2024-05-22 ENCOUNTER — Encounter: Payer: Self-pay | Admitting: Obstetrics & Gynecology

## 2024-05-22 ENCOUNTER — Ambulatory Visit: Admitting: Obstetrics & Gynecology

## 2024-05-22 VITALS — BP 137/84 | HR 90 | Ht 66.0 in | Wt 243.2 lb

## 2024-05-22 DIAGNOSIS — N939 Abnormal uterine and vaginal bleeding, unspecified: Secondary | ICD-10-CM | POA: Diagnosis not present

## 2024-05-22 DIAGNOSIS — Z1231 Encounter for screening mammogram for malignant neoplasm of breast: Secondary | ICD-10-CM | POA: Diagnosis not present

## 2024-05-22 DIAGNOSIS — Z975 Presence of (intrauterine) contraceptive device: Secondary | ICD-10-CM

## 2024-05-22 NOTE — Progress Notes (Signed)
 "  GYN VISIT Patient name: Marie Mills MRN [id number]  Date of birth: July 03, 1970 Chief Complaint:   Follow-up  History of Present Illness:   Marie Mills is a 53 y.o. [phone] female being seen today for follow up regarding:  AUB: At her last visit 03/2024, Mirena  was placed to help regulate her menses.  Today she notes that the first menses had a little spotting, but nothing since then.  Overall very happy with Mirena  and reports no acute complaints.  In review,  patient was struggling with prolonged bleeding that had last for several weeks, was initially treated with Megace .   Due to concern for possible renal Ca- plan was to postpone any gyn surgical intervention.  No LMP recorded. (Menstrual status: IUD).    Review of Systems:   Pertinent items are noted in HPI Denies fever/chills, dizziness, headaches, visual disturbances, fatigue, shortness of breath, chest pain, abdominal pain. Pertinent History Reviewed:   Past Surgical History:  Procedure Laterality Date   CHOLECYSTECTOMY  1987   COLONOSCOPY WITH PROPOFOL  N/A 07/30/2021   Procedure: COLONOSCOPY WITH PROPOFOL ;  Surgeon: Cindie Carlin POUR, DO;  Location: [facility];  Service: Endoscopy;  Laterality: N/A;  2:45pm   I&D lumbar, CSF leak repair, lumbar drain  07/21/2022   Dr Melonie   LUMBAR LAMINECTOMY/DECOMPRESSION MICRODISCECTOMY Left 06/15/2022   Procedure: LEFT L3-4 MICRODISCECTOMY;  Surgeon: Clois Fret, MD;  Location: [facility];  Service: Neurosurgery;  Laterality: Left;   LUMBAR WOUND DEBRIDEMENT N/A 07/01/2022   Procedure: LUMBAR WOUND DEBRIDEMENT;  Surgeon: Clois Fret, MD;  Location: [facility];  Service: Neurosurgery;  Laterality: N/A;   POLYPECTOMY  07/30/2021   Procedure: POLYPECTOMY;  Surgeon: Cindie Carlin POUR, DO;  Location: [facility];  Service: Endoscopy;;    Past Medical History:  Diagnosis Date   Asthma    BV (bacterial vaginosis) 08/29/2013   Contraceptive management 03/21/2013    GERD (gastroesophageal reflux disease)    Hypertension    Lumbar radiculopathy    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 Physical Assessment:   Vitals:   05/22/24 1524  BP: 137/84  Pulse: 90  Weight: 243 lb 3.2 oz (110.3 kg)  Height: 5' 6 (1.676 m)  Body mass index is 39.25 kg/m.       Physical Examination:   General appearance: alert, well appearing, and in no distress  Psych: mood appropriate, normal affect  Skin: warm & dry   Cardiovascular: normal heart rate noted  Respiratory: normal respiratory effort, no distress  Abdomen: soft, non-tender   Pelvic: VULVA: normal appearing vulva with no masses, tenderness or lesions, VAGINA: normal appearing vagina with normal color and discharge, no lesions, CERVIX: normal appearing cervix without discharge or lesions, strings visualized  Extremities: no edema   Chaperone: Alan Fischer    Assessment & Plan:  1) IUD in place/AUB - Device appears in proper location - Mirena  working well to control her symptoms - Plan to follow-up for annual or as needed  2) preventive screening -due for mammogram   Orders Placed This Encounter  Procedures   MM 3D SCREENING MAMMOGRAM BILATERAL BREAST    Return in about 1 year (around 05/22/2025) for Annual.   Baruch Lewers, DO Attending Obstetrician & Gynecologist, [facility] for [facility]  Healthcare, [facility]    "

## 2024-06-03 ENCOUNTER — Ambulatory Visit (HOSPITAL_COMMUNITY): Payer: Self-pay

## 2024-06-07 ENCOUNTER — Ambulatory Visit: Admitting: Orthopedic Surgery

## 2024-07-31 ENCOUNTER — Ambulatory Visit: Payer: Self-pay | Admitting: Adult Health

## 2024-08-13 ENCOUNTER — Telehealth: Admitting: Orthopedic Surgery

## 2024-11-11 ENCOUNTER — Ambulatory Visit: Admitting: Urology
